# Patient Record
Sex: Female | Born: 1945 | Race: White | Hispanic: No | Marital: Married | Smoking: Never smoker
Health system: Southern US, Community
[De-identification: ages and names within clinical notes are randomized; demographics above are authoritative.]

## PROBLEM LIST (undated history)

## (undated) DIAGNOSIS — I10 Essential (primary) hypertension: Secondary | ICD-10-CM

## (undated) DIAGNOSIS — G8929 Other chronic pain: Secondary | ICD-10-CM

## (undated) DIAGNOSIS — I4891 Unspecified atrial fibrillation: Secondary | ICD-10-CM

## (undated) DIAGNOSIS — D649 Anemia, unspecified: Secondary | ICD-10-CM

## (undated) DIAGNOSIS — Z9181 History of falling: Secondary | ICD-10-CM

## (undated) DIAGNOSIS — K5790 Diverticulosis of intestine, part unspecified, without perforation or abscess without bleeding: Secondary | ICD-10-CM

## (undated) DIAGNOSIS — E039 Hypothyroidism, unspecified: Secondary | ICD-10-CM

## (undated) DIAGNOSIS — M810 Age-related osteoporosis without current pathological fracture: Secondary | ICD-10-CM

## (undated) DIAGNOSIS — N189 Chronic kidney disease, unspecified: Secondary | ICD-10-CM

## (undated) HISTORY — DX: Essential (primary) hypertension: I10

## (undated) HISTORY — PX: CHOLECYSTECTOMY: SHX55

## (undated) HISTORY — PX: ABDOMINAL HYSTERECTOMY: SHX81

## (undated) HISTORY — PX: TONSILLECTOMY: SUR1361

## (undated) HISTORY — PX: BACK SURGERY: SHX140

## (undated) HISTORY — DX: Hypothyroidism, unspecified: E03.9

---

## 2019-04-20 DIAGNOSIS — E039 Hypothyroidism, unspecified: Secondary | ICD-10-CM | POA: Diagnosis present

## 2019-04-20 DIAGNOSIS — I10 Essential (primary) hypertension: Secondary | ICD-10-CM | POA: Diagnosis present

## 2019-04-20 DIAGNOSIS — M1990 Unspecified osteoarthritis, unspecified site: Secondary | ICD-10-CM | POA: Insufficient documentation

## 2019-11-13 DIAGNOSIS — K219 Gastro-esophageal reflux disease without esophagitis: Secondary | ICD-10-CM | POA: Diagnosis present

## 2020-06-09 DIAGNOSIS — N1831 Chronic kidney disease, stage 3a: Secondary | ICD-10-CM | POA: Diagnosis present

## 2020-10-16 ENCOUNTER — Encounter: Payer: Self-pay | Admitting: *Deleted

## 2021-02-24 ENCOUNTER — Ambulatory Visit (INDEPENDENT_AMBULATORY_CARE_PROVIDER_SITE_OTHER): Payer: Medicare Other | Admitting: Gastroenterology

## 2021-02-24 ENCOUNTER — Encounter: Payer: Self-pay | Admitting: Gastroenterology

## 2021-02-24 ENCOUNTER — Other Ambulatory Visit: Payer: Self-pay

## 2021-02-24 DIAGNOSIS — Z8719 Personal history of other diseases of the digestive system: Secondary | ICD-10-CM | POA: Diagnosis not present

## 2021-02-24 DIAGNOSIS — K625 Hemorrhage of anus and rectum: Secondary | ICD-10-CM | POA: Diagnosis not present

## 2021-02-24 NOTE — Progress Notes (Signed)
Primary Care Physician:  [id number], Birdie Hopes, MD Referring Physician: Dr. Levell July Primary Gastroenterologist:  Dr. Marletta Lor  Chief Complaint  Patient presents with   Consult    TCS done 6 years ago in [place]. Blood in stool in April/May few times but none since. No constipation as she takes 2 stool softners a day    HPI:   Felicia Frank is a 75 y.o. female presenting today at the request of Dr. Damaris Schooner for colonoscopy consultation.  Rectal bleeding in April/May a few times after booster shot. Works long hours in taxes. Since May hasn't seen any blood. Wasn't having the best BMs and started taking 2 stool softeners at night. BM daily, normal, no blood since May. Last colonoscopy 6 years ago in [place] by Dr. Teena Dunk. No polyps at that time. Has history of hemorrhoids. Pepcid daily. No dysphagia.   Hesitant to pursue colonoscopy.   Past Medical History:  Diagnosis Date   HTN (hypertension)    Hypothyroidism     Past Surgical History:  Procedure Laterality Date   ABDOMINAL HYSTERECTOMY     BACK SURGERY     CHOLECYSTECTOMY     TONSILLECTOMY      Current Outpatient Medications  Medication Sig Dispense Refill   carvedilol (COREG) 25 MG tablet Take 25 mg by mouth 2 (two) times daily.     cloNIDine (CATAPRES) 0.1 MG tablet 1-2 times as needed     Cream Base (Q-DERM EX) Take by mouth daily.     docusate sodium (COLACE) 100 MG capsule Take 200 mg by mouth at bedtime.     doxazosin (CARDURA) 8 MG tablet Take 8 mg by mouth at bedtime.     famotidine (PEPCID) 40 MG tablet Take 40 mg by mouth daily.     levothyroxine (SYNTHROID) 50 MCG tablet daily.     losartan (COZAAR) 100 MG tablet at bedtime.     spironolactone-hydrochlorothiazide (ALDACTAZIDE) 25-25 MG tablet daily.     traMADol (ULTRAM) 50 MG tablet daily.     zolpidem (AMBIEN) 10 MG tablet Take 10 mg by mouth at bedtime as needed.     No current facility-administered medications for this visit.    Allergies  as of 02/24/2021   (No Known Allergies)    Family History  Problem Relation Age of Onset   Colon cancer Neg Hx    Colon polyps Neg Hx     Social History   Socioeconomic History   Marital status: Married    Spouse name: Not on file   Number of children: Not on file   Years of education: Not on file   Highest education level: Not on file  Occupational History   Not on file  Tobacco Use   Smoking status: Never   Smokeless tobacco: Never  Substance and Sexual Activity   Alcohol use: Never   Drug use: Never   Sexual activity: Not on file  Other Topics Concern   Not on file  Social History Narrative   Not on file   Social Determinants of Health   Financial Resource Strain: Not on file  Food Insecurity: Not on file  Transportation Needs: Not on file  Physical Activity: Not on file  Stress: Not on file  Social Connections: Not on file  Intimate Partner Violence: Not on file    Review of Systems: Gen: Denies any fever, chills, fatigue, weight loss, lack of appetite.  CV: Denies chest pain, heart palpitations, peripheral edema, syncope.  Resp: Denies shortness  of breath at rest or with exertion. Denies wheezing or cough.  GI: see HPI GU : Denies urinary burning, urinary frequency, urinary hesitancy MS: Denies joint pain, muscle weakness, cramps, or limitation of movement.  Derm: Denies rash, itching, dry skin Psych: Denies depression, anxiety, memory loss, and confusion Heme: Denies bruising, bleeding, and enlarged lymph nodes.  Physical Exam: BP 125/67   Pulse (!) 57   Temp 98.2 F (36.8 C)   Ht 5\' 4"  (1.626 m)   Wt 192 lb 12.8 oz (87.5 kg)   BMI 33.09 kg/m  General:   Alert and oriented. Pleasant and cooperative. Well-nourished and well-developed.  Head:  Normocephalic and atraumatic. Eyes:  Without icterus, sclera clear and conjunctiva pink.  Ears:  Normal auditory acuity. Lungs:  Clear to auscultation bilaterally. No wheezes, rales, or rhonchi. No  distress.  Heart:  S1, S2 present without murmurs appreciated.  Abdomen:  +BS, soft, non-tender and non-distended. No HSM noted. No guarding or rebound. No masses appreciated.  Rectal:  no hemorrhoids. No internal mass. HEME NEGATIVE Msk:  Symmetrical without gross deformities. Normal posture. Extremities:  Without edema. Neurologic:  Alert and  oriented x4;  grossly normal neurologically. Skin:  Intact without significant lesions or rashes. Psych:  Alert and cooperative. Normal mood and affect.  ASSESSMENT/PLAN: Felicia Frank is a 75 y.o. female presenting today with history of rectal bleeding in April/May that was likely benign anorectal source; however, she has not had a colonoscopy in approximately 6 years. Overall, she is quite healthy. We discussed pursuing diagnostic colonoscopy, but she wants to hold off on this right now. I did complete a DRE today in clinic, and she was heme negative.   She inquired about other options, and we discussed Cologuard testing. We also discussed this could be a false positive or false negative and should not take the place of colonoscopy. However, she would like to pursue Cologuard before deciding on colonoscopy. If positive, she is willing to pursue a colonoscopy. If negative, she desires to monitor clinically.  Further recommendations following Cologuard results.  10-22-2002, PhD, ANP-BC [facility]

## 2021-02-24 NOTE — Patient Instructions (Signed)
Continue taking 2 stool softeners as you are doing.  We will pursue the Cologuard and go from there!  If you have any further rectal bleeding, please call me!  It was a pleasure to see you today. I want to create trusting relationships with patients to provide genuine, compassionate, and quality care. I value your feedback. If you receive a survey regarding your visit,  I greatly appreciate you taking time to fill this out.   Gelene Mink, PhD, ANP-BC [facility]

## 2021-03-16 LAB — COLOGUARD: Cologuard: NEGATIVE

## 2021-03-30 ENCOUNTER — Telehealth: Payer: Self-pay | Admitting: Internal Medicine

## 2021-03-30 NOTE — Telephone Encounter (Signed)
PATIENT CALLED ASKING ABOUT COLOGUARD RESULTS

## 2021-03-30 NOTE — Telephone Encounter (Signed)
Negative results are in Felicia Frank's box to be reviewed.

## 2021-03-31 NOTE — Telephone Encounter (Signed)
Pt was made aware and verbalized understanding. Pt states that she will revisit the colonoscopy discussion at next office visit since cologuard was negative.

## 2021-03-31 NOTE — Telephone Encounter (Signed)
Cologuard negative. Please let patient know. She was not wanting to pursue a colonoscopy at last visit. Please have her call if changes her mind.

## 2021-08-25 ENCOUNTER — Ambulatory Visit: Payer: Medicare Other | Admitting: Gastroenterology

## 2021-09-29 ENCOUNTER — Ambulatory Visit: Payer: Medicare Other | Admitting: Gastroenterology

## 2021-10-05 ENCOUNTER — Encounter (HOSPITAL_COMMUNITY): Payer: Self-pay | Admitting: Emergency Medicine

## 2021-10-05 ENCOUNTER — Emergency Department (HOSPITAL_COMMUNITY)
Admission: EM | Admit: 2021-10-05 | Discharge: 2021-10-05 | Disposition: A | Discharge status: Home / Self Care | Payer: Medicare Other | Attending: Student | Admitting: Student

## 2021-10-05 ENCOUNTER — Emergency Department (HOSPITAL_COMMUNITY): Payer: Medicare Other

## 2021-10-05 ENCOUNTER — Other Ambulatory Visit: Payer: Self-pay

## 2021-10-05 DIAGNOSIS — D649 Anemia, unspecified: Secondary | ICD-10-CM | POA: Diagnosis not present

## 2021-10-05 DIAGNOSIS — Z79899 Other long term (current) drug therapy: Secondary | ICD-10-CM | POA: Insufficient documentation

## 2021-10-05 DIAGNOSIS — N1831 Chronic kidney disease, stage 3a: Secondary | ICD-10-CM | POA: Insufficient documentation

## 2021-10-05 DIAGNOSIS — R1032 Left lower quadrant pain: Secondary | ICD-10-CM | POA: Diagnosis present

## 2021-10-05 DIAGNOSIS — I129 Hypertensive chronic kidney disease with stage 1 through stage 4 chronic kidney disease, or unspecified chronic kidney disease: Secondary | ICD-10-CM | POA: Diagnosis not present

## 2021-10-05 LAB — URINALYSIS, ROUTINE W REFLEX MICROSCOPIC
Bacteria, UA: NONE SEEN
Bilirubin Urine: NEGATIVE
Glucose, UA: NEGATIVE mg/dL
Ketones, ur: NEGATIVE mg/dL
Leukocytes,Ua: NEGATIVE
Nitrite: NEGATIVE
Protein, ur: NEGATIVE mg/dL
Specific Gravity, Urine: 1.01 (ref 1.005–1.030)
pH: 6 (ref 5.0–8.0)

## 2021-10-05 LAB — COMPREHENSIVE METABOLIC PANEL
ALT: 18 U/L (ref 0–44)
AST: 19 U/L (ref 15–41)
Albumin: 3.7 g/dL (ref 3.5–5.0)
Alkaline Phosphatase: 48 U/L (ref 38–126)
Anion gap: 8 (ref 5–15)
BUN: 28 mg/dL — ABNORMAL HIGH (ref 8–23)
CO2: 22 mmol/L (ref 22–32)
Calcium: 9.1 mg/dL (ref 8.9–10.3)
Chloride: 102 mmol/L (ref 98–111)
Creatinine, Ser: 1.27 mg/dL — ABNORMAL HIGH (ref 0.44–1.00)
GFR, Estimated: 44 mL/min — ABNORMAL LOW (ref >60)
Glucose, Bld: 99 mg/dL (ref 70–99)
Potassium: 4.7 mmol/L (ref 3.5–5.1)
Sodium: 132 mmol/L — ABNORMAL LOW (ref 135–145)
Total Bilirubin: 0.4 mg/dL (ref 0.3–1.2)
Total Protein: 6.8 g/dL (ref 6.5–8.1)

## 2021-10-05 LAB — LIPASE, BLOOD: Lipase: 25 U/L (ref 11–51)

## 2021-10-05 LAB — CBC
HCT: 34.3 % — ABNORMAL LOW (ref 36.0–46.0)
Hemoglobin: 10.8 g/dL — ABNORMAL LOW (ref 12.0–15.0)
MCH: 30.7 pg (ref 26.0–34.0)
MCHC: 31.5 g/dL (ref 30.0–36.0)
MCV: 97.4 fL (ref 80.0–100.0)
Platelets: 211 10*3/uL (ref 150–400)
RBC: 3.52 MIL/uL — ABNORMAL LOW (ref 3.87–5.11)
RDW: 12.9 % (ref 11.5–15.5)
WBC: 7 10*3/uL (ref 4.0–10.5)
nRBC: 0 % (ref 0.0–0.2)

## 2021-10-05 MED ORDER — DICYCLOMINE HCL 10 MG PO CAPS
10.0000 mg | ORAL_CAPSULE | Freq: Once | ORAL | Status: AC
Start: 2021-10-05 — End: 2021-10-05
  Administered 2021-10-05: 10 mg via ORAL
  Filled 2021-10-05: qty 1

## 2021-10-05 MED ORDER — IOHEXOL 300 MG/ML  SOLN
100.0000 mL | Freq: Once | INTRAMUSCULAR | Status: AC | PRN
  Administered 2021-10-05: 80 mL via INTRAVENOUS

## 2021-10-05 NOTE — ED Provider Notes (Signed)
?[facility] ?Provider Note ? ? ?CSN: [id number] ?Arrival date & time: 10/05/21  1603 ? ?  ? ?History ? ?Chief Complaint  ?Patient presents with  ? Abdominal Pain  ? ? ?Junella Kocsis is a 76 y.o. female. ? ?HPI ? ?Patient with medical history including hypertension, CKD stage IIIA presents with complaints of left lower quadrant tenderness.  Initially the pain was intermittent for the last week  but last night she woke up with severe pain in the left side, pain is remained constant, mainly feels in left lower quadrant will radiate to her left flank, she has no constipation diarrhea urinary symptoms, last bowel movement was today, but she states it was "airy" denies melena or hematochezia.  She denies any fevers chills or URI-like symptoms.  Denies any recent sick contacts, patient had a hysterectomy, denies alcohol use NSAID use history of kidney stones or diverticulitis.  She states that her PCP saw her and sent here for further evaluation. ? ? ? ?Home Medications ?Prior to Admission medications   ?Medication Sig Start Date End Date Taking? Authorizing Provider  ?carvedilol (COREG) 25 MG tablet Take 25 mg by mouth 2 (two) times daily. 12/02/20   [provider]  ?cloNIDine (CATAPRES) 0.1 MG tablet 1-2 times as needed 06/09/20   [provider]  ?Cream Base (Q-DERM EX) Take by mouth daily. 03/15/17   [provider]  ?docusate sodium (COLACE) 100 MG capsule Take 200 mg by mouth at bedtime.    [provider]  ?doxazosin (CARDURA) 8 MG tablet Take 8 mg by mouth at bedtime. 12/02/20   [provider]  ?famotidine (PEPCID) 40 MG tablet Take 40 mg by mouth daily. 11/01/20   [provider]  ?levothyroxine (SYNTHROID) 50 MCG tablet daily. 03/15/17   [provider]  ?losartan (COZAAR) 100 MG tablet at bedtime.    [provider]  ?spironolactone-hydrochlorothiazide (ALDACTAZIDE) 25-25 MG tablet daily. 06/09/20   [provider]   ?traMADol (ULTRAM) 50 MG tablet daily. 01/06/21   [provider]  ?zolpidem (AMBIEN) 10 MG tablet Take 10 mg by mouth at bedtime as needed. 12/11/20   [provider]  ?   ? ?Allergies    ?Patient has no known allergies.   ? ?Review of Systems   ?Review of Systems  ?Constitutional:  Negative for chills and fever.  ?Respiratory:  Negative for shortness of breath.   ?Cardiovascular:  Negative for chest pain.  ?Gastrointestinal:  Positive for abdominal pain. Negative for diarrhea, nausea and vomiting.  ?Neurological:  Negative for headaches.  ? ?Physical Exam ?Updated Vital Signs ?BP (!) 129/54   Pulse (!) 47   Temp 97.7 ?F (36.5 ?C) (Oral)   Resp 16   Ht 5\' 3"  (1.6 m)   Wt 86.6 kg   SpO2 95%   BMI 33.83 kg/m?  ?Physical Exam ?Vitals and nursing note reviewed.  ?Constitutional:   ?   General: She is not in acute distress. ?   Appearance: She is not ill-appearing.  ?HENT:  ?   Head: Normocephalic and atraumatic.  ?   Nose: No congestion.  ?Eyes:  ?   Conjunctiva/sclera: Conjunctivae normal.  ?Cardiovascular:  ?   Rate and Rhythm: Normal rate and regular rhythm.  ?   Pulses: Normal pulses.  ?   Heart sounds: No murmur heard. ?  No friction rub. No gallop.  ?Pulmonary:  ?   Effort: No respiratory distress.  ?   Breath sounds: No wheezing,  rhonchi or rales.  ?Abdominal:  ?   Palpations: Abdomen is soft.  ?   Tenderness: There is abdominal tenderness. There is no right CVA tenderness or left CVA tenderness.  ?   Comments: Abdomen nondistended no active bowel sounds, dull to percussion, has tenderness in the left lower quadrant as well as left side, no guarding rebound has peritoneal sign negative Murphy sign McBurney point, she has no CVA tenderness.  ?Musculoskeletal:  ?   Right lower leg: No edema.  ?   Left lower leg: No edema.  ?Skin: ?   General: Skin is warm and dry.  ?Neurological:  ?   Mental Status: She is alert.  ?Psychiatric:     ?   Mood and Affect: Mood normal.  ? ? ?ED Results /  Procedures / Treatments   ?Labs ?(all labs ordered are listed, but only abnormal results are displayed) ?Labs Reviewed  ?COMPREHENSIVE METABOLIC PANEL - Abnormal; Notable for the following components:  ?    Result Value  ? Sodium 132 (*)   ? BUN 28 (*)   ? Creatinine, Ser 1.27 (*)   ? GFR, Estimated 44 (*)   ? All other components within normal limits  ?CBC - Abnormal; Notable for the following components:  ? RBC 3.52 (*)   ? Hemoglobin 10.8 (*)   ? HCT 34.3 (*)   ? All other components within normal limits  ?URINALYSIS, ROUTINE W REFLEX MICROSCOPIC - Abnormal; Notable for the following components:  ? Color, Urine STRAW (*)   ? Hgb urine dipstick SMALL (*)   ? All other components within normal limits  ?LIPASE, BLOOD  ? ? ?EKG ?None ? ?Radiology ?No results found. ? ?Procedures ?Procedures  ? ? ?Medications Ordered in ED ?Medications - No data to display ? ?ED Course/ Medical Decision Making/ A&P ?  ?                        ?Medical Decision Making ?Amount and/or Complexity of Data Reviewed ?Labs: ordered. ?Radiology: ordered. ? ? ?This patient presents to the ED for concern of abdominal pain, this involves an extensive number of treatment options, and is a complaint that carries with it a high risk of complications and morbidity.  The differential diagnosis includes bowel obstruction, diverticulitis, septic stone AAA ? ? ? ?Additional history obtained: ? ?Additional history obtained from N/A ?External records from outside source obtained and reviewed including previous imaging, GI notes ? ? ?Co morbidities that complicate the patient evaluation ? ?Hypertension ? ?Social Determinants of Health: ? ?Geriatric ? ? ? ?Lab Tests: ? ?I Ordered, and personally interpreted labs.  The pertinent results include: CBC shows normocytic anemia hemoglobin 10.8, CMP shows sodium of 132 BUN of 28 creatinine 1.27 lipase 25, UA is unremarkable ? ? ?Imaging Studies ordered: ? ?I ordered imaging studies including CT abdomen pelvis ?I  independently visualized and interpreted imaging which showed pending at this time ?I agree with the radiologist interpretation ? ? ?Cardiac Monitoring: ? ?The patient was maintained on a cardiac monitor.  I personally viewed and interpreted the cardiac monitored which showed an underlying rhythm of: N/A ? ? ?Medicines ordered and prescription drug management: ? ?I ordered medication including N/A ?I have reviewed the patients home medicines and have made adjustments as needed ? ?Critical Interventions: ? ?N/A ? ? ?Reevaluation: ? ?Presents with abdominal pain, she is tender on my exam, concern for possible diverticulitis, will obtain basic lab work-up, CT  imaging and reassess. ? ? ?Consultations Obtained: ? ?N/A ? ? ? ?Test Considered: ? ?N/A ? ? ? ?Rule out ?Low suspicion for systemic infection patient nontoxic-appearing vital signs reassuring does not meet sepsis or SIRS criteria.  I have low suspicion for Pilo, kidney stone, UTI as she has no urinary symptoms, UA is negative for infection or hematuria.  I have low suspicion for lower lobe pneumonia lung sounds are clear bilaterally not Dors any upper respiratory illness.  I have low suspicion for AAA/dissection presentation atypical, no bulging mass on my exam, pain is focalized to the left lower quadrants with associated soft stools.  ? ? ? ?Dispostion and problem list ? ?Due to shift change patient may handoff to [facility] ? ?Follow-up on CT and pelvis, treat accordingly.  Suspect discharge ? ? ? ? ? ? ? ? ? ? ? ?Final Clinical Impression(s) / ED Diagnoses ?Final diagnoses:  ?Left lower quadrant abdominal pain  ? ? ?Rx / DC Orders ?ED Discharge Orders   ? ? None  ? ?  ? ? ?  ?Marcello Fennel, PA-C ?10/05/21 1845 ? ?  ?Teressa Lower, MD ?10/06/21 0101 ? ?

## 2021-10-05 NOTE — ED Provider Notes (Signed)
Patient presents due to left-sided abdominal pain.  I took over patient's care at shift change, please see previous providers for full detail note. ? ?Physical Exam  ?BP (!) 154/58   Pulse (!) 54   Temp 97.7 ?F (36.5 ?C) (Oral)   Resp 18   Ht 5\' 3"  (1.6 m)   Wt 86.6 kg   SpO2 99%   BMI 33.83 kg/m?  ? ?Physical Exam ?Vitals and nursing note reviewed. Exam conducted with a chaperone present.  ?Constitutional:   ?   Appearance: Normal appearance.  ?HENT:  ?   Head: Normocephalic and atraumatic.  ?Eyes:  ?   General: No scleral icterus.    ?   Right eye: No discharge.     ?   Left eye: No discharge.  ?   Extraocular Movements: Extraocular movements intact.  ?   Pupils: Pupils are equal, round, and reactive to light.  ?Cardiovascular:  ?   Rate and Rhythm: Normal rate and regular rhythm.  ?   Pulses: Normal pulses.  ?   Heart sounds: Normal heart sounds. No murmur heard. ?  No friction rub. No gallop.  ?Pulmonary:  ?   Effort: Pulmonary effort is normal. No respiratory distress.  ?   Breath sounds: Normal breath sounds.  ?Abdominal:  ?   General: Abdomen is flat. Bowel sounds are normal. There is no distension.  ?   Palpations: Abdomen is soft.  ?   Tenderness: There is abdominal tenderness in the left lower quadrant.  ?Skin: ?   General: Skin is warm and dry.  ?   Coloration: Skin is not jaundiced.  ?Neurological:  ?   Mental Status: She is alert. Mental status is at baseline.  ?   Coordination: Coordination normal.  ? ? ?Procedures  ?Procedures ? ?ED Course / MDM  ?  ?Medical Decision Making ?Amount and/or Complexity of Data Reviewed ?Labs: ordered. ?Radiology: ordered. ? ?Risk ?Prescription drug management. ? ? ?Patient was signed out to me pending CT abdomen and pelvis.  I did order the patient's Bentyl for pain.  The pain was significantly improved just with time on my evaluation, there are no peritoneal signs or rigidity.  CT notable for diverticulosis but no diverticulitis.  Tolerating p.o., she has  advanced age which she does have outpatient PCP follow-up and GI follow-up already established.  Do not feel she needs additional work-up or observation at this time.  Discussed with patient and her partner who is at bedside.  At this time stable for discharge with close follow-up and return precautions. ? ? ? ? ?  ? , PA-C ?10/05/21 2229 ? ?  ?2230, MD ?10/06/21 12/06/21 ? ?

## 2021-10-05 NOTE — ED Notes (Signed)
Patient transported to CT 

## 2021-10-05 NOTE — ED Triage Notes (Signed)
Pt having abdominal discomfort and pain x 3 days sent by Werner Lean for r/o diverticulitis. ?

## 2021-10-05 NOTE — Discharge Instructions (Signed)
Your care today was reassuring, as we discussed the CT scan did not show any signs of diverticulitis.  Please follow-up with your primary care doctor in the next 2 days for reevaluation.  Call your GI doctor and schedule follow-up appointment.  Return to the ED if things worsen. ?

## 2021-10-07 ENCOUNTER — Encounter: Payer: Self-pay | Admitting: Gastroenterology

## 2021-10-07 ENCOUNTER — Other Ambulatory Visit (HOSPITAL_COMMUNITY)
Admission: RE | Admit: 2021-10-07 | Discharge: 2021-10-07 | Disposition: A | Discharge status: Home / Self Care | Payer: Medicare Other | Source: Ambulatory Visit | Attending: Gastroenterology | Admitting: Gastroenterology

## 2021-10-07 ENCOUNTER — Ambulatory Visit (INDEPENDENT_AMBULATORY_CARE_PROVIDER_SITE_OTHER): Payer: Medicare Other | Admitting: Gastroenterology

## 2021-10-07 VITALS — BP 110/58 | HR 66 | Temp 97.2°F | Ht 63.5 in | Wt 195.0 lb

## 2021-10-07 DIAGNOSIS — K5900 Constipation, unspecified: Secondary | ICD-10-CM | POA: Diagnosis not present

## 2021-10-07 DIAGNOSIS — D649 Anemia, unspecified: Secondary | ICD-10-CM | POA: Diagnosis present

## 2021-10-07 DIAGNOSIS — R1310 Dysphagia, unspecified: Secondary | ICD-10-CM | POA: Diagnosis not present

## 2021-10-07 LAB — FERRITIN: Ferritin: 58 ng/mL (ref 11–307)

## 2021-10-07 LAB — IRON AND TIBC
Iron: 65 ug/dL (ref 28–170)
Saturation Ratios: 17 % (ref 10.4–31.8)
TIBC: 372 ug/dL (ref 250–450)
UIBC: 307 ug/dL

## 2021-10-07 NOTE — Patient Instructions (Signed)
Please have blood work done to check iron studies. ? ?Depending on this, we may need to do a colonoscopy/endoscopy. ? ?You can take Metamucil up to three times a day! ? ?Further recommendations to follow! ? ?I enjoyed seeing you again today! As you know, I value our relationship and want to provide genuine, compassionate, and quality care. I welcome your feedback. If you receive a survey regarding your visit,  I greatly appreciate you taking time to fill this out. See you next time! ? ?Annitta Needs, PhD, ANP-BC ?[facility]  ? ?

## 2021-10-07 NOTE — Progress Notes (Signed)
? ? ? ? ? ?Gastroenterology Office Note   ? ? ?Primary Care Physician:  [name], Birdie Hopes, MD  ?Primary Gastroenterologist: Dr. Marletta Lor ? ? ?Chief Complaint  ? ?Chief Complaint  ?Patient presents with  ? Abdominal Pain  ? ? ? ?History of Present Illness  ? ?Felicia Frank is a 76 y.o. female presenting today in follow-up with a history of constipation, remote rectal bleeding about a year ago, last colonoscopy 6 years ago by Dr. Teena Dunk without polyps. Cologuard negative.  ? ? ?CT Oct 05, 2021 with sigmoid diverticulosis but no diverticulitis. Simple right renal cyst. Hgb 10.8 in ED. Normocytic anemia.  ? ?2 stool softeners, 2 teaspoons of Metamucil in past.  ? ?Was having sharp pains in left lower back that radiates to left lower abdomen. If bending over to the right, will have pain in left back side. Didn't take stool softeners a few nights ago. Instead, took several doses of Metamucil and had more frequent stools. If doesn't take something, will have little balls of stool. Stool softeners not working as well. No rectal bleeding recently. Takes Tramadol. 81 mg aspirin at night.  ? ? ?Sometimes bread or potatoes get hung chronically. Happens every so often.  ? ? ? ?Past Medical History:  ?Diagnosis Date  ? HTN (hypertension)   ? Hypothyroidism   ? ? ?Past Surgical History:  ?Procedure Laterality Date  ? ABDOMINAL HYSTERECTOMY    ? BACK SURGERY    ? CHOLECYSTECTOMY    ? TONSILLECTOMY    ? ? ?Current Outpatient Medications  ?Medication Sig Dispense Refill  ? aspirin EC 81 MG tablet Take 81 mg by mouth daily. Swallow whole.    ? carvedilol (COREG) 25 MG tablet Take 25 mg by mouth 2 (two) times daily.    ? cloNIDine (CATAPRES) 0.1 MG tablet 1-2 times as needed    ? docusate sodium (COLACE) 100 MG capsule Take 200 mg by mouth at bedtime.    ? doxazosin (CARDURA) 8 MG tablet Take 8 mg by mouth at bedtime.    ? famotidine (PEPCID) 40 MG tablet Take 40 mg by mouth daily.    ? levothyroxine (SYNTHROID) 50 MCG tablet  daily.    ? spironolactone-hydrochlorothiazide (ALDACTAZIDE) 25-25 MG tablet daily.    ? traMADol (ULTRAM) 50 MG tablet daily.    ? zolpidem (AMBIEN) 10 MG tablet Take 10 mg by mouth at bedtime as needed.    ? ?No current facility-administered medications for this visit.  ? ? ?Allergies as of 10/07/2021  ? (No Known Allergies)  ? ? ?Family History  ?Problem Relation Age of Onset  ? Colon cancer Neg Hx   ? Colon polyps Neg Hx   ? ? ?Social History  ? ?Socioeconomic History  ? Marital status: Married  ?  Spouse name: Not on file  ? Number of children: Not on file  ? Years of education: Not on file  ? Highest education level: Not on file  ?Occupational History  ? Not on file  ?Tobacco Use  ? Smoking status: Never  ? Smokeless tobacco: Never  ?Substance and Sexual Activity  ? Alcohol use: Never  ? Drug use: Never  ? Sexual activity: Not on file  ?Other Topics Concern  ? Not on file  ?Social History Narrative  ? Not on file  ? ?Social Determinants of Health  ? ?Financial Resource Strain: Not on file  ?Food Insecurity: Not on file  ?Transportation Needs: Not on file  ?Physical Activity: Not on  file  ?Stress: Not on file  ?Social Connections: Not on file  ?Intimate Partner Violence: Not on file  ? ? ? ?Review of Systems  ? ?Gen: Denies any fever, chills, fatigue, weight loss, lack of appetite.  ?CV: Denies chest pain, heart palpitations, peripheral edema, syncope.  ?Resp: Denies shortness of breath at rest or with exertion. Denies wheezing or cough.  ?GI: see HPI ?GU : Denies urinary burning, urinary frequency, urinary hesitancy ?MS: Denies joint pain, muscle weakness, cramps, or limitation of movement.  ?Derm: Denies rash, itching, dry skin ?Psych: Denies depression, anxiety, memory loss, and confusion ?Heme: Denies bruising, bleeding, and enlarged lymph nodes. ? ? ?Physical Exam  ? ?BP (!) 110/58   Pulse 66   Temp (!) 97.2 ?F (36.2 ?C)   Ht 5' 3.5" (1.613 m)   Wt 195 lb (88.5 kg)   BMI 34.00 kg/m?  ?General:   Alert  and oriented. Pleasant and cooperative. Well-nourished and well-developed.  ?Head:  Normocephalic and atraumatic. ?Eyes:  Without icterus ?Abdomen:  +BS, soft, non-tender and non-distended. No HSM noted. No guarding or rebound. No masses appreciated.  ?Rectal:  Deferred  ?Msk:  Symmetrical without gross deformities. Normal posture. ?Extremities:  Without edema. ?Neurologic:  Alert and  oriented x4;  grossly normal neurologically. ?Skin:  Intact without significant lesions or rashes. ?Psych:  Alert and cooperative. Normal mood and affect. ? ? ?Assessment  ? ?Felicia Frank is a 76 y.o. female presenting today in follow-up with a history of   constipation, remote rectal bleeding about a year ago, last colonoscopy 6 years ago by Dr. Teena Dunk without polyps. Cologuard negative.  ? ?Notes left lower back pain/left sided abdominal pain: CT negative. Appears musculoskeletal. Physical exam benign.  ? ?Normocytic anemia: patient reports Hgb normally in 13 range. Hgb 10.8 recently. No overt GI bleeding currently. Will check iron studies. ? ?Dysphagia: to bread or potatoes. 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 Awaiting iron studies. May need colonoscopy along with EGD/dilation ? ?Constipation: stop stool softeners as not effective. Increase Metamucil.  ? ? ?PLAN  ? ?May increase Metamucil ?Iron studies today ?If IDA, needs colonoscopy/EGD. If no IDA, offer EGD/dilation due to dysphagia.  ? ? ?Gelene Mink, PhD, ANP-BC ?[facility]  ? ? ?

## 2021-11-18 ENCOUNTER — Encounter: Payer: Self-pay | Admitting: Gastroenterology

## 2021-12-23 ENCOUNTER — Ambulatory Visit: Payer: Medicare Other | Admitting: Gastroenterology

## 2022-02-02 ENCOUNTER — Ambulatory Visit: Payer: Medicare Other | Admitting: Gastroenterology

## 2022-06-08 ENCOUNTER — Ambulatory Visit: Payer: Medicare Other | Admitting: Gastroenterology

## 2022-09-12 IMAGING — CT CT ABD-PELV W/ CM
2 of 5 series · 16 of 46 positions shown, 18 images · IV contrast (Omnipaque or Isovue)
Comparison: None Available.

CLINICAL DATA: Left-sided abdominal pain for 3 days.

EXAM:
CT ABDOMEN AND PELVIS WITH CONTRAST
TECHNIQUE: Multidetector CT imaging of the abdomen and pelvis was performed
using the standard protocol following bolus administration of
intravenous contrast.

[Series 2: axial st · axial · 0.96mm/px · z∈[+952,+1362]mm · 13 of 92 slices shown, 15 images]
[im 5/92  soft-tissue]
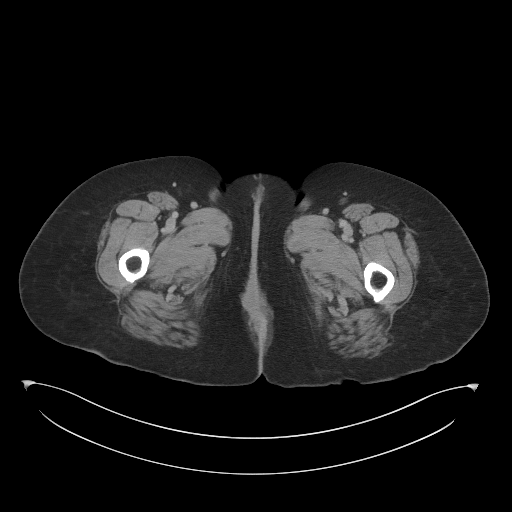
[im 5/92  bone]
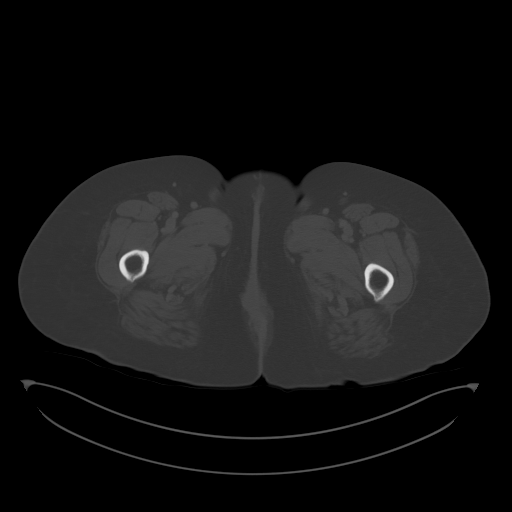
[im 15/92  soft-tissue]
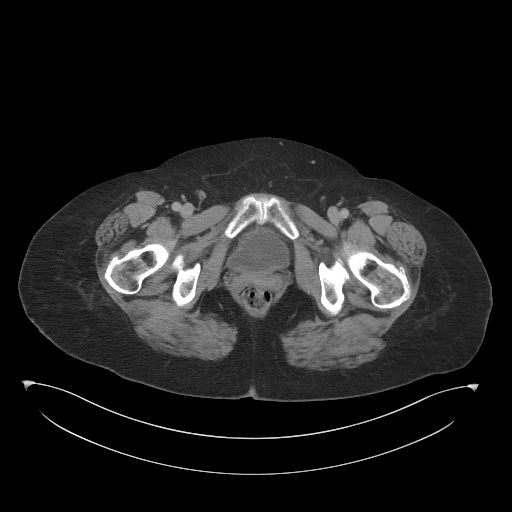
[im 20/92  soft-tissue]
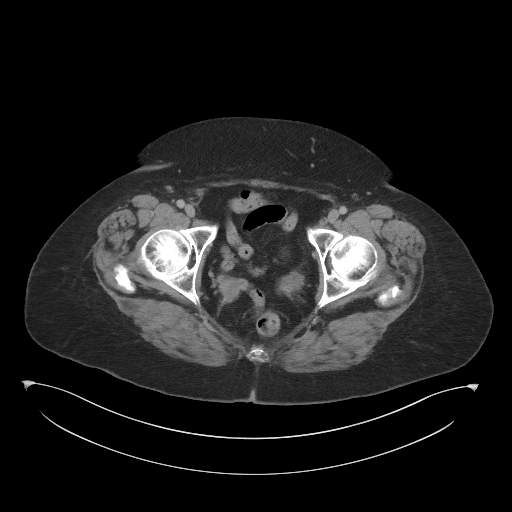
[im 24/92  soft-tissue]
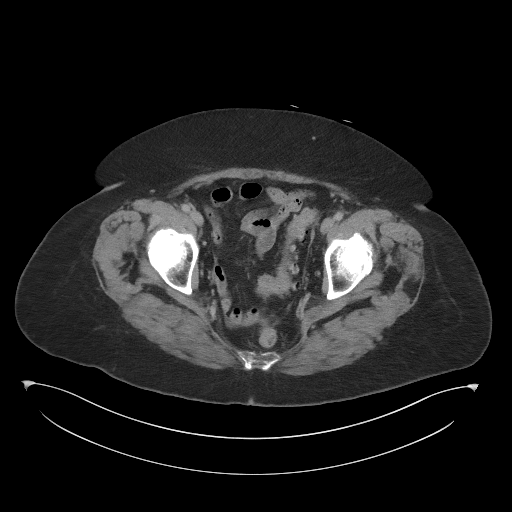
[im 34/92  soft-tissue]
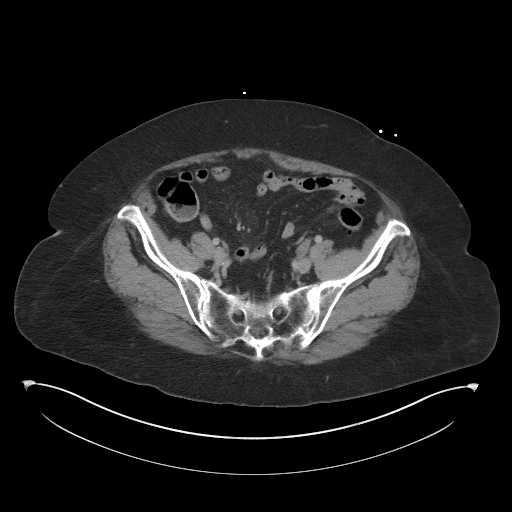
[im 39/92  soft-tissue]
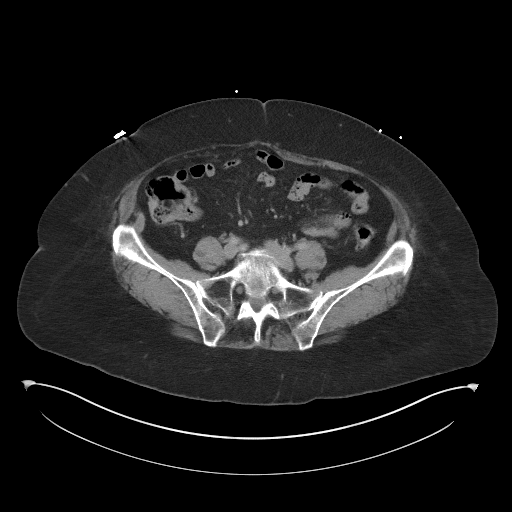
[im 48/92  soft-tissue]
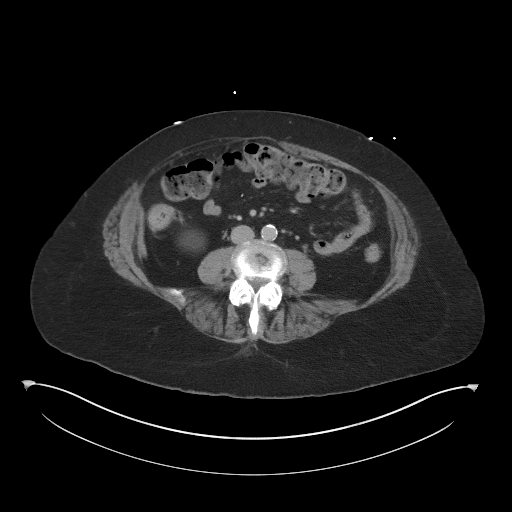
[im 53/92  soft-tissue]
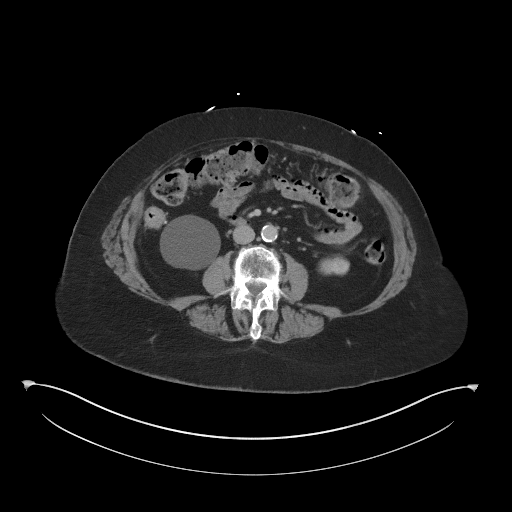
[im 58/92  soft-tissue]
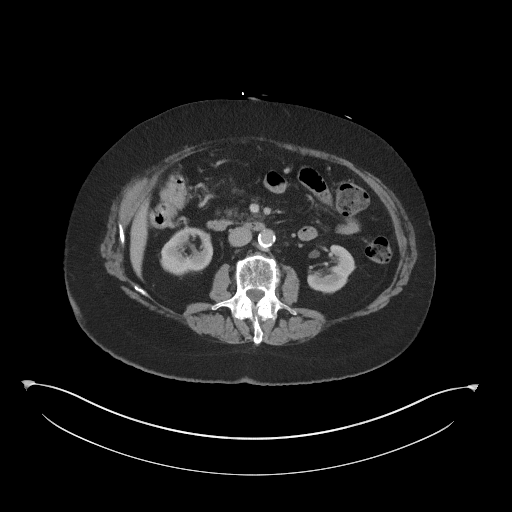
[im 58/92  bone]
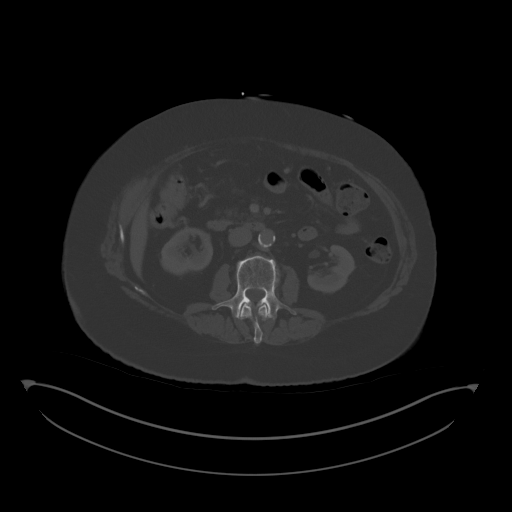
[im 68/92  soft-tissue]
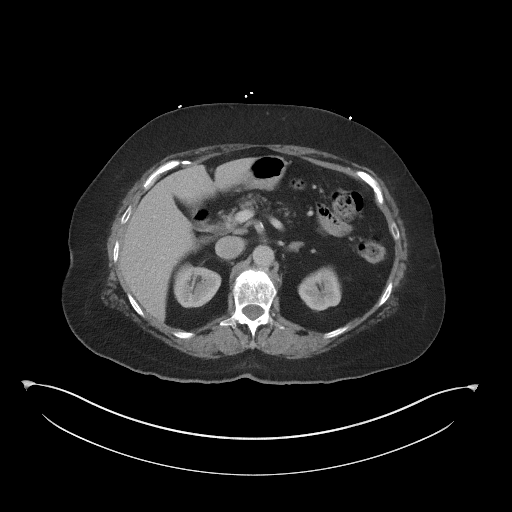
[im 72/92  soft-tissue]
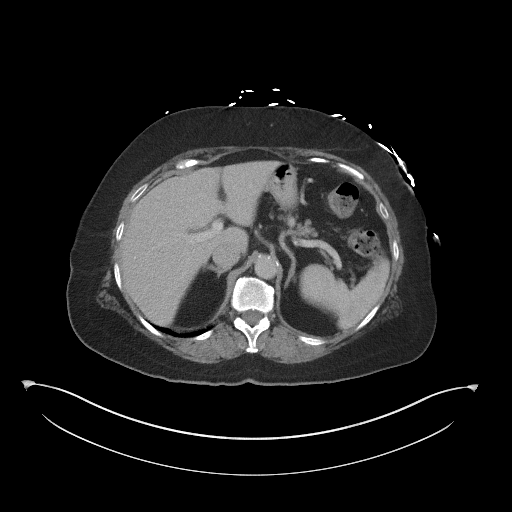
[im 77/92  soft-tissue]
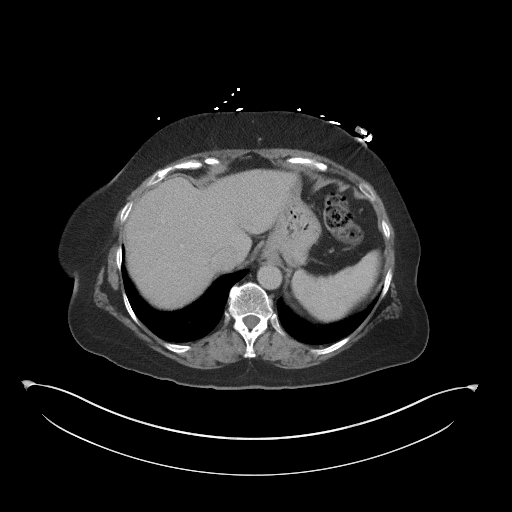
[im 87/92  soft-tissue]
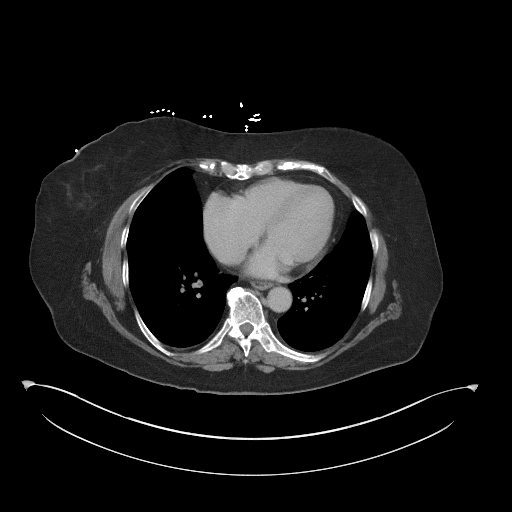

[Series 5: coronal st · coronal · 0.79mm/px · 3 of 113 slices shown]
[im 38/113  soft-tissue]
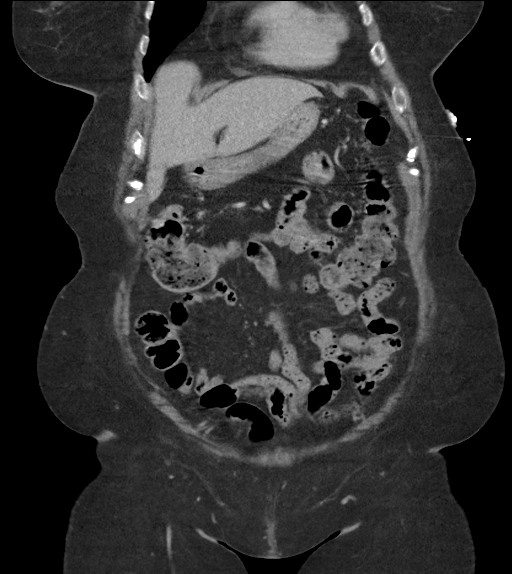
[im 50/113  soft-tissue]
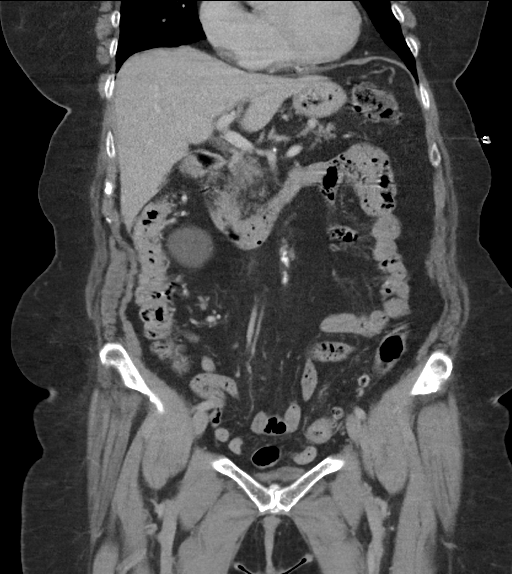
[im 63/113  soft-tissue]
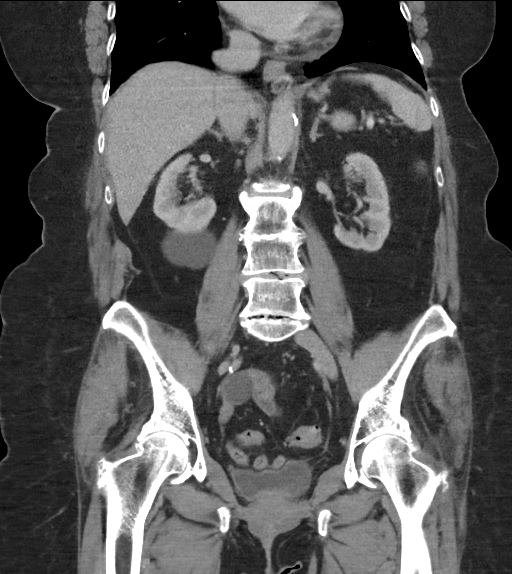

[16 of 46 positions shown; findings below may reference images not displayed]

RADIATION DOSE REDUCTION: This exam was performed according to the
departmental dose-optimization program which includes automated
exposure control, adjustment of the mA and/or kV according to
patient size and/or use of iterative reconstruction technique.

CONTRAST:  80mL OMNIPAQUE IOHEXOL 300 MG/ML  SOLN
FINDINGS: Lower chest: No acute abnormality.

Hepatobiliary: No focal liver abnormality is seen. Status post
cholecystectomy. No biliary dilatation.

Pancreas: Unremarkable. No pancreatic ductal dilatation or
surrounding inflammatory changes.

Spleen: Normal in size without focal abnormality.

Adrenals/Urinary Tract: Normal adrenal glands. Simple cyst emanating
from the lower pole of the right kidney measures up to 5.5 cm in
estimated maximum diameter. This appears benign and requires no
follow-up. The bladder is decompressed.

Stomach/Bowel: Diverticulosis of the sigmoid and descending colon
without CT evidence acute diverticulitis. Bowel shows no evidence of
obstruction, ileus or visible lesion. The appendix is not visualized
and may have been previously removed. No free intraperitoneal air
identified.

Vascular/Lymphatic: Atherosclerosis of the abdominal aorta without
aneurysm. No lymphadenopathy identified.

Reproductive: Status post hysterectomy. No adnexal masses.

Other: No abdominal wall hernia or abnormality. No abdominopelvic
ascites.

Musculoskeletal: Degenerative disc disease of the lumbar spine. No
fractures or bony lesions identified.
IMPRESSION: 1. Diverticulosis of the sigmoid colon without evidence of acute
diverticulitis by CT.
2. Simple cyst of the right kidney measures up to 5.5 cm. This
requires no follow-up.

## 2023-06-14 ENCOUNTER — Other Ambulatory Visit (HOSPITAL_COMMUNITY): Payer: Self-pay | Admitting: Surgery

## 2023-06-14 DIAGNOSIS — M5416 Radiculopathy, lumbar region: Secondary | ICD-10-CM

## 2023-06-22 ENCOUNTER — Ambulatory Visit (HOSPITAL_COMMUNITY)
Admission: RE | Admit: 2023-06-22 | Discharge: 2023-06-22 | Disposition: A | Discharge status: Home / Self Care | Payer: Medicare Other | Source: Ambulatory Visit | Attending: Surgery | Admitting: Surgery

## 2023-06-22 DIAGNOSIS — M5416 Radiculopathy, lumbar region: Secondary | ICD-10-CM | POA: Diagnosis present

## 2023-06-22 MED ORDER — GADOBUTROL 1 MMOL/ML IV SOLN
9.0000 mL | Freq: Once | INTRAVENOUS | Status: AC | PRN
  Administered 2023-06-22: 9 mL via INTRAVENOUS

## 2023-09-16 ENCOUNTER — Encounter (HOSPITAL_COMMUNITY): Payer: Self-pay | Admitting: *Deleted

## 2023-09-16 ENCOUNTER — Emergency Department (HOSPITAL_COMMUNITY)

## 2023-09-16 ENCOUNTER — Inpatient Hospital Stay (HOSPITAL_COMMUNITY)
Admission: EM | Admit: 2023-09-16 | Discharge: 2023-09-28 | DRG: 391 | Disposition: A | Discharge status: Home Health | Attending: Internal Medicine | Admitting: Internal Medicine

## 2023-09-16 ENCOUNTER — Other Ambulatory Visit: Payer: Self-pay

## 2023-09-16 DIAGNOSIS — D631 Anemia in chronic kidney disease: Secondary | ICD-10-CM | POA: Diagnosis present

## 2023-09-16 DIAGNOSIS — Z79890 Hormone replacement therapy (postmenopausal): Secondary | ICD-10-CM

## 2023-09-16 DIAGNOSIS — D649 Anemia, unspecified: Secondary | ICD-10-CM

## 2023-09-16 DIAGNOSIS — Z7982 Long term (current) use of aspirin: Secondary | ICD-10-CM

## 2023-09-16 DIAGNOSIS — E66811 Obesity, class 1: Secondary | ICD-10-CM

## 2023-09-16 DIAGNOSIS — K632 Fistula of intestine: Principal | ICD-10-CM | POA: Diagnosis present

## 2023-09-16 DIAGNOSIS — Z79818 Long term (current) use of other agents affecting estrogen receptors and estrogen levels: Secondary | ICD-10-CM

## 2023-09-16 DIAGNOSIS — I10 Essential (primary) hypertension: Secondary | ICD-10-CM | POA: Diagnosis present

## 2023-09-16 DIAGNOSIS — I4819 Other persistent atrial fibrillation: Secondary | ICD-10-CM | POA: Diagnosis present

## 2023-09-16 DIAGNOSIS — S32010A Wedge compression fracture of first lumbar vertebra, initial encounter for closed fracture: Secondary | ICD-10-CM | POA: Diagnosis present

## 2023-09-16 DIAGNOSIS — K567 Ileus, unspecified: Secondary | ICD-10-CM | POA: Diagnosis present

## 2023-09-16 DIAGNOSIS — Z7901 Long term (current) use of anticoagulants: Secondary | ICD-10-CM

## 2023-09-16 DIAGNOSIS — K219 Gastro-esophageal reflux disease without esophagitis: Secondary | ICD-10-CM | POA: Diagnosis present

## 2023-09-16 DIAGNOSIS — R109 Unspecified abdominal pain: Secondary | ICD-10-CM

## 2023-09-16 DIAGNOSIS — I129 Hypertensive chronic kidney disease with stage 1 through stage 4 chronic kidney disease, or unspecified chronic kidney disease: Secondary | ICD-10-CM | POA: Diagnosis present

## 2023-09-16 DIAGNOSIS — Z888 Allergy status to other drugs, medicaments and biological substances status: Secondary | ICD-10-CM

## 2023-09-16 DIAGNOSIS — K56600 Partial intestinal obstruction, unspecified as to cause: Secondary | ICD-10-CM | POA: Diagnosis present

## 2023-09-16 DIAGNOSIS — E039 Hypothyroidism, unspecified: Secondary | ICD-10-CM | POA: Diagnosis present

## 2023-09-16 DIAGNOSIS — E860 Dehydration: Secondary | ICD-10-CM | POA: Diagnosis present

## 2023-09-16 DIAGNOSIS — K5732 Diverticulitis of large intestine without perforation or abscess without bleeding: Secondary | ICD-10-CM | POA: Diagnosis not present

## 2023-09-16 DIAGNOSIS — K5792 Diverticulitis of intestine, part unspecified, without perforation or abscess without bleeding: Secondary | ICD-10-CM | POA: Diagnosis present

## 2023-09-16 DIAGNOSIS — E875 Hyperkalemia: Secondary | ICD-10-CM | POA: Diagnosis present

## 2023-09-16 DIAGNOSIS — I4891 Unspecified atrial fibrillation: Secondary | ICD-10-CM | POA: Diagnosis not present

## 2023-09-16 DIAGNOSIS — M4856XA Collapsed vertebra, not elsewhere classified, lumbar region, initial encounter for fracture: Secondary | ICD-10-CM | POA: Diagnosis present

## 2023-09-16 DIAGNOSIS — E871 Hypo-osmolality and hyponatremia: Secondary | ICD-10-CM | POA: Diagnosis present

## 2023-09-16 DIAGNOSIS — Z6832 Body mass index (BMI) 32.0-32.9, adult: Secondary | ICD-10-CM

## 2023-09-16 DIAGNOSIS — K651 Peritoneal abscess: Secondary | ICD-10-CM | POA: Diagnosis present

## 2023-09-16 DIAGNOSIS — N179 Acute kidney failure, unspecified: Secondary | ICD-10-CM | POA: Diagnosis present

## 2023-09-16 DIAGNOSIS — K5720 Diverticulitis of large intestine with perforation and abscess without bleeding: Principal | ICD-10-CM | POA: Diagnosis present

## 2023-09-16 DIAGNOSIS — N1831 Chronic kidney disease, stage 3a: Secondary | ICD-10-CM | POA: Diagnosis present

## 2023-09-16 DIAGNOSIS — Z79899 Other long term (current) drug therapy: Secondary | ICD-10-CM | POA: Diagnosis not present

## 2023-09-16 DIAGNOSIS — R627 Adult failure to thrive: Secondary | ICD-10-CM | POA: Diagnosis present

## 2023-09-16 DIAGNOSIS — S32010D Wedge compression fracture of first lumbar vertebra, subsequent encounter for fracture with routine healing: Secondary | ICD-10-CM | POA: Diagnosis not present

## 2023-09-16 LAB — CBC WITH DIFFERENTIAL/PLATELET
Abs Immature Granulocytes: 0.38 10*3/uL — ABNORMAL HIGH (ref 0.00–0.07)
Basophils Absolute: 0.1 10*3/uL (ref 0.0–0.1)
Basophils Relative: 0 %
Eosinophils Absolute: 0.3 10*3/uL (ref 0.0–0.5)
Eosinophils Relative: 2 %
HCT: 31.9 % — ABNORMAL LOW (ref 36.0–46.0)
Hemoglobin: 10.6 g/dL — ABNORMAL LOW (ref 12.0–15.0)
Immature Granulocytes: 3 %
Lymphocytes Relative: 6 %
Lymphs Abs: 0.8 10*3/uL (ref 0.7–4.0)
MCH: 31.5 pg (ref 26.0–34.0)
MCHC: 33.2 g/dL (ref 30.0–36.0)
MCV: 94.7 fL (ref 80.0–100.0)
Monocytes Absolute: 0.8 10*3/uL (ref 0.1–1.0)
Monocytes Relative: 6 %
Neutro Abs: 10.8 10*3/uL — ABNORMAL HIGH (ref 1.7–7.7)
Neutrophils Relative %: 83 %
Platelets: 278 10*3/uL (ref 150–400)
RBC: 3.37 MIL/uL — ABNORMAL LOW (ref 3.87–5.11)
RDW: 13.8 % (ref 11.5–15.5)
WBC: 13.1 10*3/uL — ABNORMAL HIGH (ref 4.0–10.5)
nRBC: 0 % (ref 0.0–0.2)

## 2023-09-16 LAB — URINALYSIS, ROUTINE W REFLEX MICROSCOPIC
Bacteria, UA: NONE SEEN
Bilirubin Urine: NEGATIVE
Glucose, UA: NEGATIVE mg/dL
Hgb urine dipstick: NEGATIVE
Ketones, ur: NEGATIVE mg/dL
Nitrite: NEGATIVE
Protein, ur: NEGATIVE mg/dL
Specific Gravity, Urine: 1.015 (ref 1.005–1.030)
pH: 5 (ref 5.0–8.0)

## 2023-09-16 LAB — COMPREHENSIVE METABOLIC PANEL WITH GFR
ALT: 23 U/L (ref 0–44)
AST: 15 U/L (ref 15–41)
Albumin: 2.9 g/dL — ABNORMAL LOW (ref 3.5–5.0)
Alkaline Phosphatase: 73 U/L (ref 38–126)
Anion gap: 10 (ref 5–15)
BUN: 38 mg/dL — ABNORMAL HIGH (ref 8–23)
CO2: 20 mmol/L — ABNORMAL LOW (ref 22–32)
Calcium: 9.2 mg/dL (ref 8.9–10.3)
Chloride: 99 mmol/L (ref 98–111)
Creatinine, Ser: 1.46 mg/dL — ABNORMAL HIGH (ref 0.44–1.00)
GFR, Estimated: 37 mL/min — ABNORMAL LOW (ref >60)
Glucose, Bld: 102 mg/dL — ABNORMAL HIGH (ref 70–99)
Potassium: 4.7 mmol/L (ref 3.5–5.1)
Sodium: 129 mmol/L — ABNORMAL LOW (ref 135–145)
Total Bilirubin: 0.3 mg/dL (ref 0.0–1.2)
Total Protein: 6.7 g/dL (ref 6.5–8.1)

## 2023-09-16 LAB — LIPASE, BLOOD: Lipase: 27 U/L (ref 11–51)

## 2023-09-16 MED ORDER — ENOXAPARIN SODIUM 40 MG/0.4ML IJ SOSY
40.0000 mg | PREFILLED_SYRINGE | INTRAMUSCULAR | Status: DC
  Administered 2023-09-16: 40 mg via SUBCUTANEOUS
  Filled 2023-09-16: qty 0.4

## 2023-09-16 MED ORDER — SODIUM CHLORIDE 0.9 % IV SOLN: INTRAVENOUS | Status: DC

## 2023-09-16 MED ORDER — MORPHINE SULFATE (PF) 4 MG/ML IV SOLN
4.0000 mg | Freq: Once | INTRAVENOUS | Status: AC
  Administered 2023-09-16: 4 mg via INTRAVENOUS
  Filled 2023-09-16: qty 1

## 2023-09-16 MED ORDER — CIPROFLOXACIN IN D5W 400 MG/200ML IV SOLN
400.0000 mg | Freq: Once | INTRAVENOUS | Status: AC
  Administered 2023-09-16: 400 mg via INTRAVENOUS
  Filled 2023-09-16: qty 200

## 2023-09-16 MED ORDER — ZOLPIDEM TARTRATE 5 MG PO TABS
5.0000 mg | ORAL_TABLET | Freq: Every evening | ORAL | Status: DC | PRN
  Administered 2023-09-18 – 2023-09-26 (×2): 5 mg via ORAL
  Filled 2023-09-16 (×2): qty 1

## 2023-09-16 MED ORDER — SODIUM CHLORIDE 0.9 % IV BOLUS
1000.0000 mL | Freq: Once | INTRAVENOUS | Status: AC
Start: 2023-09-16 — End: 2023-09-16
  Administered 2023-09-16: 1000 mL via INTRAVENOUS

## 2023-09-16 MED ORDER — METRONIDAZOLE 500 MG/100ML IV SOLN
500.0000 mg | Freq: Once | INTRAVENOUS | Status: AC
  Administered 2023-09-16: 500 mg via INTRAVENOUS
  Filled 2023-09-16: qty 100

## 2023-09-16 MED ORDER — IOHEXOL 300 MG/ML  SOLN
80.0000 mL | Freq: Once | INTRAMUSCULAR | Status: AC | PRN
  Administered 2023-09-16: 80 mL via INTRAVENOUS

## 2023-09-16 MED ORDER — CARVEDILOL 12.5 MG PO TABS
25.0000 mg | ORAL_TABLET | Freq: Two times a day (BID) | ORAL | Status: DC
  Administered 2023-09-16 – 2023-09-20 (×5): 25 mg via ORAL
  Filled 2023-09-16 (×7): qty 2

## 2023-09-16 MED ORDER — DOXAZOSIN MESYLATE 2 MG PO TABS
8.0000 mg | ORAL_TABLET | Freq: Every day | ORAL | Status: DC
  Administered 2023-09-16 – 2023-09-27 (×6): 8 mg via ORAL
  Filled 2023-09-16 (×9): qty 4

## 2023-09-16 MED ORDER — CIPROFLOXACIN IN D5W 400 MG/200ML IV SOLN
400.0000 mg | Freq: Two times a day (BID) | INTRAVENOUS | Status: DC
  Administered 2023-09-17: 400 mg via INTRAVENOUS
  Filled 2023-09-16: qty 200

## 2023-09-16 MED ORDER — FAMOTIDINE 20 MG PO TABS
40.0000 mg | ORAL_TABLET | Freq: Every day | ORAL | Status: DC
  Administered 2023-09-17: 40 mg via ORAL
  Filled 2023-09-16: qty 2

## 2023-09-16 MED ORDER — ONDANSETRON HCL 4 MG/2ML IJ SOLN
4.0000 mg | Freq: Once | INTRAMUSCULAR | Status: AC
Start: 2023-09-16 — End: 2023-09-16
  Administered 2023-09-16: 4 mg via INTRAVENOUS
  Filled 2023-09-16: qty 2

## 2023-09-16 MED ORDER — HYDROMORPHONE HCL 1 MG/ML IJ SOLN
0.5000 mg | INTRAMUSCULAR | Status: DC | PRN
  Administered 2023-09-16 – 2023-09-17 (×2): 0.5 mg via INTRAVENOUS
  Filled 2023-09-16 (×2): qty 0.5

## 2023-09-16 MED ORDER — METRONIDAZOLE 500 MG/100ML IV SOLN
500.0000 mg | Freq: Two times a day (BID) | INTRAVENOUS | Status: DC
  Administered 2023-09-17 – 2023-09-18 (×3): 500 mg via INTRAVENOUS
  Filled 2023-09-16 (×3): qty 100

## 2023-09-16 MED ORDER — DOCUSATE SODIUM 100 MG PO CAPS
200.0000 mg | ORAL_CAPSULE | Freq: Every day | ORAL | Status: DC
  Administered 2023-09-16 – 2023-09-27 (×7): 200 mg via ORAL
  Filled 2023-09-16 (×9): qty 2

## 2023-09-16 MED ORDER — LEVOTHYROXINE SODIUM 50 MCG PO TABS
50.0000 ug | ORAL_TABLET | Freq: Every day | ORAL | Status: DC
  Administered 2023-09-17 – 2023-09-28 (×7): 50 ug via ORAL
  Filled 2023-09-16: qty 1
  Filled 2023-09-16 (×2): qty 2
  Filled 2023-09-16 (×4): qty 1
  Filled 2023-09-16: qty 2

## 2023-09-16 NOTE — H&P (Signed)
 History and Physical    Patient: Felicia Frank YNW:[id number] DOB: 1946-05-25 DOA: 09/16/2023 DOS: the patient was seen and examined on 09/16/2023 PCP: [name], [name], MD  Patient coming from: Home  Chief Complaint:  Chief Complaint  Patient presents with   Fall   HPI: Felicia Frank is a 78 y.o. female with medical history significant of Hypothyroidism, htn, CKD stage1. She presents with decreased appetite, FTT for 1 month with decreasing weight -she states that she is lost 20 pounds over the last month.  She has had very little appetite.  She did have a fall a couple of days ago and has had some back pain since then.  No muscle weakness, incontinence, urinary retention.  Review of Systems: As mentioned in the history of present illness. All other systems reviewed and are negative. Past Medical History:  Diagnosis Date   HTN (hypertension)    Hypothyroidism    Past Surgical History:  Procedure Laterality Date   ABDOMINAL HYSTERECTOMY     BACK SURGERY     CHOLECYSTECTOMY     TONSILLECTOMY     Social History:  reports that she has never smoked. She has never used smokeless tobacco. She reports that she does not drink alcohol and does not use drugs.  No Known Allergies  Family History  Problem Relation Age of Onset   Colon cancer Neg Hx    Colon polyps Neg Hx     Prior to Admission medications   Medication Sig Start Date End Date Taking? Authorizing Provider  aspirin EC 81 MG tablet Take 81 mg by mouth daily. Swallow whole.    [provider]  carvedilol  (COREG ) 25 MG tablet Take 25 mg by mouth 2 (two) times daily. 12/02/20   [provider]  cloNIDine (CATAPRES) 0.1 MG tablet 1-2 times as needed 06/09/20   [provider]  docusate sodium  (COLACE) 100 MG capsule Take 200 mg by mouth at bedtime.    [provider]  doxazosin  (CARDURA ) 8 MG tablet Take 8 mg by mouth at bedtime. 12/02/20   [provider]  famotidine  (PEPCID )  40 MG tablet Take 40 mg by mouth daily. 11/01/20   [provider]  levothyroxine  (SYNTHROID ) 50 MCG tablet daily. 03/15/17   [provider]  spironolactone-hydrochlorothiazide (ALDACTAZIDE) 25-25 MG tablet daily. 06/09/20   [provider]  traMADol (ULTRAM) 50 MG tablet daily. 01/06/21   [provider]  zolpidem  (AMBIEN ) 10 MG tablet Take 10 mg by mouth at bedtime as needed. 12/11/20   [provider]    Physical Exam: Vitals:   09/16/23 1800 09/16/23 1830 09/16/23 1900 09/16/23 1916  BP: (!) 159/92 (!) 166/95 (!) 166/69   Pulse: 70 67    Resp:      Temp:    98.1 F (36.7 C)  TempSrc:    Oral  SpO2: 91% 93%    Weight:      Height:       General: elderly female. Awake and alert and oriented x3. No acute cardiopulmonary distress.  HEENT: Normocephalic atraumatic.  Right and left ears normal in appearance.  Pupils equal, round, reactive to light. Extraocular muscles are intact. Sclerae anicteric and noninjected.  Moist mucosal membranes. No mucosal lesions.  Neck: Neck supple without lymphadenopathy. No carotid bruits. No masses palpated.  Cardiovascular: Regular rate with normal S1-S2 sounds. No murmurs, rubs, gallops auscultated. No JVD.  Respiratory: Good respiratory effort with no wheezes, rales, rhonchi. Lungs clear to auscultation bilaterally.  No  accessory muscle use. Abdomen: Soft, tender diffusely with mild guarding. No rebound.  nondistended. Active bowel sounds. No masses or hepatosplenomegaly  Skin: No rashes, lesions, or ulcerations.  Dry, warm to touch. 2+ dorsalis pedis and radial pulses. Musculoskeletal: No calf or leg pain. All major joints not erythematous nontender.  No upper or lower joint deformation.  Good ROM.  No contractures  Psychiatric: Intact judgment and insight. Pleasant and cooperative. Neurologic: No focal neurological deficits. Strength is 5/5 and symmetric in upper and lower extremities.  Cranial nerves II through  XII are grossly intact.  Data Reviewed: Results for orders placed or performed during the hospital encounter of 09/16/23 (from the past 24 hours)  CBC with Differential     Status: Abnormal   Collection Time: 09/16/23 12:45 PM  Result Value Ref Range   WBC 13.1 (H) 4.0 - 10.5 K/uL   RBC 3.37 (L) 3.87 - 5.11 MIL/uL   Hemoglobin 10.6 (L) 12.0 - 15.0 g/dL   HCT 16.1 (L) 09.6 - 04.5 %   MCV 94.7 80.0 - 100.0 fL   MCH 31.5 26.0 - 34.0 pg   MCHC 33.2 30.0 - 36.0 g/dL   RDW 40.9 81.1 - 91.4 %   Platelets 278 150 - 400 K/uL   nRBC 0.0 0.0 - 0.2 %   Neutrophils Relative % 83 %   Neutro Abs 10.8 (H) 1.7 - 7.7 K/uL   Lymphocytes Relative 6 %   Lymphs Abs 0.8 0.7 - 4.0 K/uL   Monocytes Relative 6 %   Monocytes Absolute 0.8 0.1 - 1.0 K/uL   Eosinophils Relative 2 %   Eosinophils Absolute 0.3 0.0 - 0.5 K/uL   Basophils Relative 0 %   Basophils Absolute 0.1 0.0 - 0.1 K/uL   Immature Granulocytes 3 %   Abs Immature Granulocytes 0.38 (H) 0.00 - 0.07 K/uL  Comprehensive metabolic panel     Status: Abnormal   Collection Time: 09/16/23 12:45 PM  Result Value Ref Range   Sodium 129 (L) 135 - 145 mmol/L   Potassium 4.7 3.5 - 5.1 mmol/L   Chloride 99 98 - 111 mmol/L   CO2 20 (L) 22 - 32 mmol/L   Glucose, Bld 102 (H) 70 - 99 mg/dL   BUN 38 (H) 8 - 23 mg/dL   Creatinine, Ser 7.82 (H) 0.44 - 1.00 mg/dL   Calcium 9.2 8.9 - 95.6 mg/dL   Total Protein 6.7 6.5 - 8.1 g/dL   Albumin 2.9 (L) 3.5 - 5.0 g/dL   AST 15 15 - 41 U/L   ALT 23 0 - 44 U/L   Alkaline Phosphatase 73 38 - 126 U/L   Total Bilirubin 0.3 0.0 - 1.2 mg/dL   GFR, Estimated 37 (L) >60 mL/min   Anion gap 10 5 - 15  Lipase, blood     Status: None   Collection Time: 09/16/23 12:45 PM  Result Value Ref Range   Lipase 27 11 - 51 U/L  Urinalysis, Routine w reflex microscopic -Urine, Clean Catch     Status: Abnormal   Collection Time: 09/16/23  3:08 PM  Result Value Ref Range   Color, Urine YELLOW YELLOW   APPearance HAZY (A) CLEAR    Specific Gravity, Urine 1.015 1.005 - 1.030   pH 5.0 5.0 - 8.0   Glucose, UA NEGATIVE NEGATIVE mg/dL   Hgb urine dipstick NEGATIVE NEGATIVE   Bilirubin Urine NEGATIVE NEGATIVE   Ketones, ur NEGATIVE NEGATIVE mg/dL   Protein, ur NEGATIVE NEGATIVE mg/dL  Nitrite NEGATIVE NEGATIVE   Leukocytes,Ua TRACE (A) NEGATIVE   RBC / HPF 0-5 0 - 5 RBC/hpf   WBC, UA 0-5 0 - 5 WBC/hpf   Bacteria, UA NONE SEEN NONE SEEN   Squamous Epithelial / HPF 0-5 0 - 5 /HPF   Hyaline Casts, UA PRESENT     CT ABDOMEN PELVIS W CONTRAST Result Date: 09/16/2023 CLINICAL DATA:  Abdominal and back pain after fall on Wednesday. Unable to eat for 2 weeks. Emesis after eating. Recent weight loss of 20 pounds. Back surgery in November of 2024 EXAM: CT ABDOMEN AND PELVIS WITH CONTRAST CT Lumbar Spine with contrast TECHNIQUE: Technique: Multiplanar CT images of the lumbar spine were reconstructed from contemporary CT of the Abdomen and Pelvis. Multidetector CT imaging of the abdomen and pelvis was performed using the standard protocol following bolus administration of intravenous contrast. RADIATION DOSE REDUCTION: This exam was performed according to the departmental dose-optimization program which includes automated exposure control, adjustment of the mA and/or kV according to patient size and/or use of iterative reconstruction technique. CONTRAST:  80mL OMNIPAQUE  IOHEXOL  300 MG/ML  SOLN COMPARISON:  CT abdomen pelvis 10/05/2021 and MRI lumbar spine 06/22/2023 FINDINGS: ABDOMEN/PELVIS: Lower chest: No acute abnormality. Hepatobiliary: Unremarkable liver. Cholecystectomy. No biliary dilation. Pancreas: Fatty atrophy.  No acute abnormality. Spleen: Unremarkable. Adrenals/Urinary Tract: Stable adrenal glands and kidneys. No urinary calculi or hydronephrosis. Bladder is unremarkable. Stomach/Bowel: Sigmoid diverticulosis. Wall thickening and adjacent stranding about the sigmoid colon there is a tract of gas and fluid extending from the  inflamed sigmoid colon to an adjacent loop of small bowel compatible with coloenteric fistula (series 3/image 50 [id number]). Normal caliber large and small bowel. Moderate colonic stool burden. The appendix is not visualized.Stomach is within normal limits. Vascular/Lymphatic: Aortic atherosclerosis. No enlarged abdominal or pelvic lymph nodes. Reproductive: Status post hysterectomy. No adnexal masses. Other: Free fluid in the pelvis along the inflamed sigmoid colon and the colo enteric fistula. Musculoskeletal: No acute fracture in the pelvis. LUMBAR SPINE: Segmentation: 5 lumbar type vertebrae. Alignment: No evidence of traumatic listhesis. Chronic retrolisthesis of L3 and L5. Vertebrae: Superior endplate compression fracture of L1 is new since MRI 06/22/2023. There is 15 percent vertebral body height loss centrally. 2 mm of retropulsion of the superior endplate. Paraspinal and other soft tissues: See above. Disc levels: Multilevel spondylosis, disc space height loss, degenerative endplate changes greatest at L3-L4. Spinal canal narrowing is greatest at L4-L5 where it is moderate. IMPRESSION: 1. Acute sigmoid diverticulitis with coloenteric fistula. 2. Superior endplate compression fracture of L1 is new since MRI 06/22/2023. There is 15 percent vertebral body height loss centrally. 2 mm of retropulsion of the superior endplate. Electronically Signed   By: Rozell Cornet M.D.   On: 09/16/2023 17:59   CT L-SPINE NO CHARGE Result Date: 09/16/2023 CLINICAL DATA:  Abdominal and back pain after fall on Wednesday. Unable to eat for 2 weeks. Emesis after eating. Recent weight loss of 20 pounds. Back surgery in November of 2024 EXAM: CT ABDOMEN AND PELVIS WITH CONTRAST CT Lumbar Spine with contrast TECHNIQUE: Technique: Multiplanar CT images of the lumbar spine were reconstructed from contemporary CT of the Abdomen and Pelvis. Multidetector CT imaging of the abdomen and pelvis was performed using the standard protocol  following bolus administration of intravenous contrast. RADIATION DOSE REDUCTION: This exam was performed according to the departmental dose-optimization program which includes automated exposure control, adjustment of the mA and/or kV according to patient size and/or use of iterative reconstruction technique. CONTRAST:  80mL OMNIPAQUE  IOHEXOL  300 MG/ML  SOLN COMPARISON:  CT abdomen pelvis 10/05/2021 and MRI lumbar spine 06/22/2023 FINDINGS: ABDOMEN/PELVIS: Lower chest: No acute abnormality. Hepatobiliary: Unremarkable liver. Cholecystectomy. No biliary dilation. Pancreas: Fatty atrophy.  No acute abnormality. Spleen: Unremarkable. Adrenals/Urinary Tract: Stable adrenal glands and kidneys. No urinary calculi or hydronephrosis. Bladder is unremarkable. Stomach/Bowel: Sigmoid diverticulosis. Wall thickening and adjacent stranding about the sigmoid colon there is a tract of gas and fluid extending from the inflamed sigmoid colon to an adjacent loop of small bowel compatible with coloenteric fistula (series 3/image 50 [id number]). Normal caliber large and small bowel. Moderate colonic stool burden. The appendix is not visualized.Stomach is within normal limits. Vascular/Lymphatic: Aortic atherosclerosis. No enlarged abdominal or pelvic lymph nodes. Reproductive: Status post hysterectomy. No adnexal masses. Other: Free fluid in the pelvis along the inflamed sigmoid colon and the colo enteric fistula. Musculoskeletal: No acute fracture in the pelvis. LUMBAR SPINE: Segmentation: 5 lumbar type vertebrae. Alignment: No evidence of traumatic listhesis. Chronic retrolisthesis of L3 and L5. Vertebrae: Superior endplate compression fracture of L1 is new since MRI 06/22/2023. There is 15 percent vertebral body height loss centrally. 2 mm of retropulsion of the superior endplate. Paraspinal and other soft tissues: See above. Disc levels: Multilevel spondylosis, disc space height loss, degenerative endplate changes greatest at L3-L4.  Spinal canal narrowing is greatest at L4-L5 where it is moderate. IMPRESSION: 1. Acute sigmoid diverticulitis with coloenteric fistula. 2. Superior endplate compression fracture of L1 is new since MRI 06/22/2023. There is 15 percent vertebral body height loss centrally. 2 mm of retropulsion of the superior endplate. Electronically Signed   By: Rozell Cornet M.D.   On: 09/16/2023 17:59     Assessment and Plan: No notes have been filed under this hospital service. Service: Hospitalist  Principal Problem:   Diverticulitis Active Problems:   Essential hypertension   Gastroesophageal reflux disease   Hypothyroidism   Stage 3a chronic kidney disease (HCC)   Compression fracture of L1 lumbar vertebra (HCC)   Hyponatremia   Enterocolic fistula  Diverticulitis N.p.o. except with sips with medicine Cipro  and Flagyl  for antibiotics General Surgery consulted No surgery at this moment. Enterocolic fistula Will need to follow up with general surgery Compression fracture of L1 TLSO brace ordered Hyponatremia Will give IV fluids Hypothyroidism Continue levothyroxine  Stage IIIa chronic kidney disease Recheck creatinine in the morning Hypertension Home meds ordered   Advance Care Planning:   Code Status: Full Code confirmed by patient  Consults: Gen Surg  Family Communication:   Severity of Illness: The appropriate patient status for this patient is INPATIENT. Inpatient status is judged to be reasonable and necessary in order to provide the required intensity of service to ensure the patient's safety. The patient's presenting symptoms, physical exam findings, and initial radiographic and laboratory data in the context of their chronic comorbidities is felt to place them at high risk for further clinical deterioration. Furthermore, it is not anticipated that the patient will be medically stable for discharge from the hospital within 2 midnights of admission.   * I certify that at the  point of admission it is my clinical judgment that the patient will require inpatient hospital care spanning beyond 2 midnights from the point of admission due to high intensity of service, high risk for further deterioration and high frequency of surveillance required.*  Author: Layli Capshaw J Ronal Maybury, DO 09/16/2023 7:45 PM  For on call review [email or web address].

## 2023-09-16 NOTE — ED Triage Notes (Addendum)
 Pt fell on Wednesday, c/o abd and back pain.  Pt states she is not able to eat for past 2 weeks. + emesis after eating. Pt states she tripped over something with the fall, denies hitting her head. Denies any blood thinners. Pt states she has lost 20 lbs  Pt states she had back surgery in Nov. 2024  Unable to take oral temp due to pt sipping on soda in waiting room

## 2023-09-16 NOTE — ED Notes (Addendum)
 ED TO INPATIENT HANDOFF REPORT  ED Nurse Name and Phone #: Gaylan Kaufman [phone]  S Name/Age/Gender Felicia Frank 78 y.o. female Room/Bed: APFT21/APFT21  Code Status   Code Status: Not on file  Home/SNF/Other Home Patient oriented to: self, place, time, and situation Is this baseline? Yes   Triage Complete: Triage complete  Chief Complaint Diverticulitis [K57.92]  Triage Note Pt fell on Wednesday, c/o abd and back pain.  Pt states she is not able to eat for past 2 weeks. + emesis after eating. Pt states she tripped over something with the fall, denies hitting her head. Denies any blood thinners. Pt states she has lost 20 lbs  Pt states she had back surgery in Nov. 2024  Unable to take oral temp due to pt sipping on soda in waiting room    Allergies No Known Allergies  Level of Care/Admitting Diagnosis ED Disposition     ED Disposition  Admit   Condition  --   Comment  Hospital Area: [facility] [[id number]]  Level of Care: Med-Surg [16]  Covid Evaluation: Asymptomatic - no recent exposure (last 10 days) testing not required  Diagnosis: Diverticulitis [132440]  Admitting Physician: Malka Sea [[id number]]  Attending Physician: Malka Sea [[id number]]  Certification:: I certify this patient will need inpatient services for at least 2 midnights  Expected Medical Readiness: 09/20/2023          B Medical/Surgery History Past Medical History:  Diagnosis Date   HTN (hypertension)    Hypothyroidism    Past Surgical History:  Procedure Laterality Date   ABDOMINAL HYSTERECTOMY     BACK SURGERY     CHOLECYSTECTOMY     TONSILLECTOMY       A IV Location/Drains/Wounds Patient Lines/Drains/Airways Status     Active Line/Drains/Airways     Name Placement date Placement time Site Days   Peripheral IV 09/16/23 20 G 1" Right Antecubital 09/16/23  1305  Antecubital  less than 1            Intake/Output Last 24 hours No intake or output data  in the 24 hours ending 09/16/23 1922  Labs/Imaging Results for orders placed or performed during the hospital encounter of 09/16/23 (from the past 48 hours)  CBC with Differential     Status: Abnormal   Collection Time: 09/16/23 12:45 PM  Result Value Ref Range   WBC 13.1 (H) 4.0 - 10.5 K/uL   RBC 3.37 (L) 3.87 - 5.11 MIL/uL   Hemoglobin 10.6 (L) 12.0 - 15.0 g/dL   HCT 10.2 (L) 72.5 - 36.6 %   MCV 94.7 80.0 - 100.0 fL   MCH 31.5 26.0 - 34.0 pg   MCHC 33.2 30.0 - 36.0 g/dL   RDW 44.0 34.7 - 42.5 %   Platelets 278 150 - 400 K/uL   nRBC 0.0 0.0 - 0.2 %   Neutrophils Relative % 83 %   Neutro Abs 10.8 (H) 1.7 - 7.7 K/uL   Lymphocytes Relative 6 %   Lymphs Abs 0.8 0.7 - 4.0 K/uL   Monocytes Relative 6 %   Monocytes Absolute 0.8 0.1 - 1.0 K/uL   Eosinophils Relative 2 %   Eosinophils Absolute 0.3 0.0 - 0.5 K/uL   Basophils Relative 0 %   Basophils Absolute 0.1 0.0 - 0.1 K/uL   Immature Granulocytes 3 %   Abs Immature Granulocytes 0.38 (H) 0.00 - 0.07 K/uL    Comment: Performed at [facility], [address], [place], [place] [address]  Comprehensive metabolic panel     Status: Abnormal   Collection Time: 09/16/23 12:45 PM  Result Value Ref Range   Sodium 129 (L) 135 - 145 mmol/L   Potassium 4.7 3.5 - 5.1 mmol/L   Chloride 99 98 - 111 mmol/L   CO2 20 (L) 22 - 32 mmol/L   Glucose, Bld 102 (H) 70 - 99 mg/dL    Comment: Glucose reference range applies only to samples taken after fasting for at least 8 hours.   BUN 38 (H) 8 - 23 mg/dL   Creatinine, Ser 1.61 (H) 0.44 - 1.00 mg/dL   Calcium 9.2 8.9 - 09.6 mg/dL   Total Protein 6.7 6.5 - 8.1 g/dL   Albumin 2.9 (L) 3.5 - 5.0 g/dL   AST 15 15 - 41 U/L   ALT 23 0 - 44 U/L   Alkaline Phosphatase 73 38 - 126 U/L   Total Bilirubin 0.3 0.0 - 1.2 mg/dL   GFR, Estimated 37 (L) >60 mL/min    Comment: (NOTE) Calculated using the CKD-EPI Creatinine Equation (2021)    Anion gap 10 5 - 15    Comment: Performed at [facility], [address], [place], [place] [address]  Lipase, blood     Status: None   Collection Time: 09/16/23 12:45 PM  Result Value Ref Range   Lipase 27 11 - 51 U/L    Comment: Performed at [facility], [address] [place], [place] [address]  Urinalysis, Routine w reflex microscopic -Urine, Clean Catch     Status: Abnormal   Collection Time: 09/16/23  3:08 PM  Result Value Ref Range   Color, Urine YELLOW YELLOW   APPearance HAZY (A) CLEAR   Specific Gravity, Urine 1.015 1.005 - 1.030   pH 5.0 5.0 - 8.0   Glucose, UA NEGATIVE NEGATIVE mg/dL   Hgb urine dipstick NEGATIVE NEGATIVE   Bilirubin Urine NEGATIVE NEGATIVE   Ketones, ur NEGATIVE NEGATIVE mg/dL   Protein, ur NEGATIVE NEGATIVE mg/dL   Nitrite NEGATIVE NEGATIVE   Leukocytes,Ua TRACE (A) NEGATIVE   RBC / HPF 0-5 0 - 5 RBC/hpf   WBC, UA 0-5 0 - 5 WBC/hpf   Bacteria, UA NONE SEEN NONE SEEN   Squamous Epithelial / HPF 0-5 0 - 5 /HPF   Hyaline Casts, UA PRESENT     Comment: Performed at [facility], [address], [place], [place] [address]   CT ABDOMEN PELVIS W CONTRAST Result Date: 09/16/2023 CLINICAL DATA:  Abdominal and back pain after fall on Wednesday. Unable to eat for 2 weeks. Emesis after eating. Recent weight loss of 20 pounds. Back surgery in November of 2024 EXAM: CT ABDOMEN AND PELVIS WITH CONTRAST CT Lumbar Spine with contrast TECHNIQUE: Technique: Multiplanar CT images of the lumbar spine were reconstructed from contemporary CT of the Abdomen and Pelvis. Multidetector CT imaging of the abdomen and pelvis was performed using the standard protocol following bolus administration of intravenous contrast. RADIATION DOSE REDUCTION: This exam was performed according to the departmental dose-optimization program which includes automated exposure control, adjustment of the mA and/or kV according to patient size and/or use of iterative reconstruction technique. CONTRAST:  80mL OMNIPAQUE  IOHEXOL  300 MG/ML  SOLN COMPARISON:  CT abdomen pelvis 10/05/2021  and MRI lumbar spine 06/22/2023 FINDINGS: ABDOMEN/PELVIS: Lower chest: No acute abnormality. Hepatobiliary: Unremarkable liver. Cholecystectomy. No biliary dilation. Pancreas: Fatty atrophy.  No acute abnormality. Spleen: Unremarkable. Adrenals/Urinary Tract: Stable adrenal glands and kidneys. No urinary calculi or hydronephrosis. Bladder is unremarkable. Stomach/Bowel:  Sigmoid diverticulosis. Wall thickening and adjacent stranding about the sigmoid colon there is a tract of gas and fluid extending from the inflamed sigmoid colon to an adjacent loop of small bowel compatible with coloenteric fistula (series 3/image 50 9-66). Normal caliber large and small bowel. Moderate colonic stool burden. The appendix is not visualized.Stomach is within normal limits. Vascular/Lymphatic: Aortic atherosclerosis. No enlarged abdominal or pelvic lymph nodes. Reproductive: Status post hysterectomy. No adnexal masses. Other: Free fluid in the pelvis along the inflamed sigmoid colon and the colo enteric fistula. Musculoskeletal: No acute fracture in the pelvis. LUMBAR SPINE: Segmentation: 5 lumbar type vertebrae. Alignment: No evidence of traumatic listhesis. Chronic retrolisthesis of L3 and L5. Vertebrae: Superior endplate compression fracture of L1 is new since MRI 06/22/2023. There is 15 percent vertebral body height loss centrally. 2 mm of retropulsion of the superior endplate. Paraspinal and other soft tissues: See above. Disc levels: Multilevel spondylosis, disc space height loss, degenerative endplate changes greatest at L3-L4. Spinal canal narrowing is greatest at L4-L5 where it is moderate. IMPRESSION: 1. Acute sigmoid diverticulitis with coloenteric fistula. 2. Superior endplate compression fracture of L1 is new since MRI 06/22/2023. There is 15 percent vertebral body height loss centrally. 2 mm of retropulsion of the superior endplate. Electronically Signed   By: Rozell Cornet M.D.   On: 09/16/2023 17:59   CT L-SPINE NO  CHARGE Result Date: 09/16/2023 CLINICAL DATA:  Abdominal and back pain after fall on Wednesday. Unable to eat for 2 weeks. Emesis after eating. Recent weight loss of 20 pounds. Back surgery in November of 2024 EXAM: CT ABDOMEN AND PELVIS WITH CONTRAST CT Lumbar Spine with contrast TECHNIQUE: Technique: Multiplanar CT images of the lumbar spine were reconstructed from contemporary CT of the Abdomen and Pelvis. Multidetector CT imaging of the abdomen and pelvis was performed using the standard protocol following bolus administration of intravenous contrast. RADIATION DOSE REDUCTION: This exam was performed according to the departmental dose-optimization program which includes automated exposure control, adjustment of the mA and/or kV according to patient size and/or use of iterative reconstruction technique. CONTRAST:  80mL OMNIPAQUE  IOHEXOL  300 MG/ML  SOLN COMPARISON:  CT abdomen pelvis 10/05/2021 and MRI lumbar spine 06/22/2023 FINDINGS: ABDOMEN/PELVIS: Lower chest: No acute abnormality. Hepatobiliary: Unremarkable liver. Cholecystectomy. No biliary dilation. Pancreas: Fatty atrophy.  No acute abnormality. Spleen: Unremarkable. Adrenals/Urinary Tract: Stable adrenal glands and kidneys. No urinary calculi or hydronephrosis. Bladder is unremarkable. Stomach/Bowel: Sigmoid diverticulosis. Wall thickening and adjacent stranding about the sigmoid colon there is a tract of gas and fluid extending from the inflamed sigmoid colon to an adjacent loop of small bowel compatible with coloenteric fistula (series 3/image 50 9-66). Normal caliber large and small bowel. Moderate colonic stool burden. The appendix is not visualized.Stomach is within normal limits. Vascular/Lymphatic: Aortic atherosclerosis. No enlarged abdominal or pelvic lymph nodes. Reproductive: Status post hysterectomy. No adnexal masses. Other: Free fluid in the pelvis along the inflamed sigmoid colon and the colo enteric fistula. Musculoskeletal: No acute  fracture in the pelvis. LUMBAR SPINE: Segmentation: 5 lumbar type vertebrae. Alignment: No evidence of traumatic listhesis. Chronic retrolisthesis of L3 and L5. Vertebrae: Superior endplate compression fracture of L1 is new since MRI 06/22/2023. There is 15 percent vertebral body height loss centrally. 2 mm of retropulsion of the superior endplate. Paraspinal and other soft tissues: See above. Disc levels: Multilevel spondylosis, disc space height loss, degenerative endplate changes greatest at L3-L4. Spinal canal narrowing is greatest at L4-L5 where it is moderate. IMPRESSION: 1. Acute sigmoid  diverticulitis with coloenteric fistula. 2. Superior endplate compression fracture of L1 is new since MRI 06/22/2023. There is 15 percent vertebral body height loss centrally. 2 mm of retropulsion of the superior endplate. Electronically Signed   By: Rozell Cornet M.D.   On: 09/16/2023 17:59    Pending Labs Unresulted Labs (From admission, onward)    None       Vitals/Pain Today's Vitals   09/16/23 1830 09/16/23 1900 09/16/23 1915 09/16/23 1916  BP: (!) 166/95 (!) 166/69    Pulse: 67     Resp:      Temp:    98.1 F (36.7 C)  TempSrc:    Oral  SpO2: 93%     Weight:      Height:      PainSc:   5      Isolation Precautions No active isolations  Medications Medications  ciprofloxacin  (CIPRO ) IVPB 400 mg (400 mg Intravenous New Bag/Given 09/16/23 1913)  metroNIDAZOLE  (FLAGYL ) IVPB 500 mg (500 mg Intravenous New Bag/Given 09/16/23 1914)  ondansetron  (ZOFRAN ) injection 4 mg (4 mg Intravenous Given 09/16/23 1309)  morphine  (PF) 4 MG/ML injection 4 mg (4 mg Intravenous Given 09/16/23 1310)  iohexol  (OMNIPAQUE ) 300 MG/ML solution 80 mL (80 mLs Intravenous Contrast Given 09/16/23 1548)  morphine  (PF) 4 MG/ML injection 4 mg (4 mg Intravenous Given 09/16/23 1709)  sodium chloride  0.9 % bolus 1,000 mL (1,000 mLs Intravenous New Bag/Given 09/16/23 1714)    Mobility walks       R Recommendations: See  Admitting Provider Note  Report given to: [place]

## 2023-09-16 NOTE — Progress Notes (Signed)
   09/16/23 2205  TOC Brief Assessment  Insurance and Status Reviewed  Patient has primary care physician Yes  Home environment has been reviewed From home  Prior level of function: Independent  Prior/Current Home Services No current home services  Social Drivers of Health Review SDOH reviewed no interventions necessary  Readmission risk has been reviewed Yes  Transition of care needs no transition of care needs at this time   Transition of Care Department [facility]) has reviewed patient and no other TOC needs have been identified at this time. We will continue to monitor patient advancement through interdisciplinary progression rounds. If new patient needs arise, please place a TOC consult.

## 2023-09-16 NOTE — ED Provider Notes (Signed)
 [place] [facility] AT [facility] Provider Note   CSN: [id number] Arrival date & time: 09/16/23  1019     History  Chief Complaint  Patient presents with   Fall    Felicia Frank is a 78 y.o. female with a history including hypertension, hypothyroidism history of dysphagia and constipation, surgical history significant for cholecystectomy and abdominal hysterectomy presenting for evaluation of abdominal and back pain.  She states she has had a poor appetite for the past 2 weeks and has endorsed multiple episodes of post meal emesis, resulting in a nearly 20 pound weight loss in that time.  She also has complaint of a trip and fall which occurred 2 days ago, she denies hitting her head, denies LOC, dizziness, has been ambulatory since this event and denies any pain in her extremities although she has some bruising on her lower legs and states she also landed on her left side causing some soreness along her left flank and lower back.  She denies fevers or chills, denies diarrhea or current constipation.  The history is provided by the patient and a relative (dg at bedside).       Home Medications Prior to Admission medications   Medication Sig Start Date End Date Taking? Authorizing Provider  aspirin EC 81 MG tablet Take 81 mg by mouth daily. Swallow whole.    [provider]  carvedilol  (COREG ) 25 MG tablet Take 25 mg by mouth 2 (two) times daily. 12/02/20   [provider]  cloNIDine (CATAPRES) 0.1 MG tablet 1-2 times as needed 06/09/20   [provider]  docusate sodium  (COLACE) 100 MG capsule Take 200 mg by mouth at bedtime.    [provider]  doxazosin  (CARDURA ) 8 MG tablet Take 8 mg by mouth at bedtime. 12/02/20   [provider]  famotidine  (PEPCID ) 40 MG tablet Take 40 mg by mouth daily. 11/01/20   [provider]  levothyroxine  (SYNTHROID ) 50 MCG tablet daily. 03/15/17   [provider]   spironolactone-hydrochlorothiazide (ALDACTAZIDE) 25-25 MG tablet daily. 06/09/20   [provider]  traMADol (ULTRAM) 50 MG tablet daily. 01/06/21   [provider]  zolpidem  (AMBIEN ) 10 MG tablet Take 10 mg by mouth at bedtime as needed. 12/11/20   [provider]      Allergies    Patient has no known allergies.    Review of Systems   Review of Systems  Constitutional:  Positive for appetite change and unexpected weight change. Negative for chills and fever.  HENT:  Negative for congestion and sore throat.   Eyes: Negative.   Respiratory:  Negative for chest tightness and shortness of breath.   Cardiovascular:  Negative for chest pain.  Gastrointestinal:  Positive for abdominal pain, nausea and vomiting.  Genitourinary: Negative.   Musculoskeletal:  Negative for arthralgias, joint swelling and neck pain.  Skin: Negative.  Negative for rash and wound.  Neurological:  Negative for dizziness, weakness, light-headedness, numbness and headaches.  Psychiatric/Behavioral: Negative.      Physical Exam Updated Vital Signs BP 129/63 (BP Location: Left Arm)   Pulse (!) 59   Temp 97.8 F (36.6 C) (Oral)   Resp 18   Ht 5' 3.5" (1.613 m)   Wt 78 kg   SpO2 96%   BMI 29.99 kg/m  Physical Exam Vitals and nursing note reviewed.  Constitutional:      Appearance: She is well-developed.  HENT:     Head: Normocephalic and atraumatic.  Eyes:     Conjunctiva/sclera: Conjunctivae normal.  Cardiovascular:     Rate and Rhythm: Normal rate and regular rhythm.     Heart sounds: Normal heart sounds.  Pulmonary:     Effort: Pulmonary effort is normal.     Breath sounds: Normal breath sounds. No wheezing.  Abdominal:     General: Bowel sounds are normal.     Palpations: Abdomen is soft.     Tenderness: There is abdominal tenderness in the left upper quadrant and left lower quadrant. There is no guarding or rebound.  Musculoskeletal:        General: Normal range of  motion.     Cervical back: Normal range of motion.     Lumbar back: Bony tenderness present. No swelling.     Comments: Ttp midline lumbar.  Well healed midline incision.  No erythema.   Skin:    General: Skin is warm and dry.  Neurological:     Mental Status: She is alert.     ED Results / Procedures / Treatments   Labs (all labs ordered are listed, but only abnormal results are displayed) Labs Reviewed  CBC WITH DIFFERENTIAL/PLATELET - Abnormal; Notable for the following components:      Result Value   WBC 13.1 (*)    RBC 3.37 (*)    Hemoglobin 10.6 (*)    HCT 31.9 (*)    Neutro Abs 10.8 (*)    Abs Immature Granulocytes 0.38 (*)    All other components within normal limits  COMPREHENSIVE METABOLIC PANEL WITH GFR - Abnormal; Notable for the following components:   Sodium 129 (*)    CO2 20 (*)    Glucose, Bld 102 (*)    BUN 38 (*)    Creatinine, Ser 1.46 (*)    Albumin 2.9 (*)    GFR, Estimated 37 (*)    All other components within normal limits  URINALYSIS, ROUTINE W REFLEX MICROSCOPIC - Abnormal; Notable for the following components:   APPearance HAZY (*)    Leukocytes,Ua TRACE (*)    All other components within normal limits  LIPASE, BLOOD    EKG None  Radiology CT ABDOMEN PELVIS W CONTRAST Result Date: 09/16/2023 CLINICAL DATA:  Abdominal and back pain after fall on Wednesday. Unable to eat for 2 weeks. Emesis after eating. Recent weight loss of 20 pounds. Back surgery in November of 2024 EXAM: CT ABDOMEN AND PELVIS WITH CONTRAST CT Lumbar Spine with contrast TECHNIQUE: Technique: Multiplanar CT images of the lumbar spine were reconstructed from contemporary CT of the Abdomen and Pelvis. Multidetector CT imaging of the abdomen and pelvis was performed using the standard protocol following bolus administration of intravenous contrast. RADIATION DOSE REDUCTION: This exam was performed according to the departmental dose-optimization program which includes automated  exposure control, adjustment of the mA and/or kV according to patient size and/or use of iterative reconstruction technique. CONTRAST:  80mL OMNIPAQUE  IOHEXOL  300 MG/ML  SOLN COMPARISON:  CT abdomen pelvis 10/05/2021 and MRI lumbar spine 06/22/2023 FINDINGS: ABDOMEN/PELVIS: Lower chest: No acute abnormality. Hepatobiliary: Unremarkable liver. Cholecystectomy. No biliary dilation. Pancreas: Fatty atrophy.  No acute abnormality. Spleen: Unremarkable. Adrenals/Urinary Tract: Stable adrenal glands and kidneys. No urinary calculi or hydronephrosis. Bladder is unremarkable. Stomach/Bowel: Sigmoid diverticulosis. Wall thickening and adjacent stranding about the sigmoid colon there is a tract of gas and fluid extending from the inflamed sigmoid colon to an adjacent loop of small bowel compatible with coloenteric fistula (series 3/image 50 [id number]). Normal caliber large and  small bowel. Moderate colonic stool burden. The appendix is not visualized.Stomach is within normal limits. Vascular/Lymphatic: Aortic atherosclerosis. No enlarged abdominal or pelvic lymph nodes. Reproductive: Status post hysterectomy. No adnexal masses. Other: Free fluid in the pelvis along the inflamed sigmoid colon and the colo enteric fistula. Musculoskeletal: No acute fracture in the pelvis. LUMBAR SPINE: Segmentation: 5 lumbar type vertebrae. Alignment: No evidence of traumatic listhesis. Chronic retrolisthesis of L3 and L5. Vertebrae: Superior endplate compression fracture of L1 is new since MRI 06/22/2023. There is 15 percent vertebral body height loss centrally. 2 mm of retropulsion of the superior endplate. Paraspinal and other soft tissues: See above. Disc levels: Multilevel spondylosis, disc space height loss, degenerative endplate changes greatest at L3-L4. Spinal canal narrowing is greatest at L4-L5 where it is moderate. IMPRESSION: 1. Acute sigmoid diverticulitis with coloenteric fistula. 2. Superior endplate compression fracture of L1 is new  since MRI 06/22/2023. There is 15 percent vertebral body height loss centrally. 2 mm of retropulsion of the superior endplate. Electronically Signed   By: Rozell Cornet M.D.   On: 09/16/2023 17:59   CT L-SPINE NO CHARGE Result Date: 09/16/2023 CLINICAL DATA:  Abdominal and back pain after fall on Wednesday. Unable to eat for 2 weeks. Emesis after eating. Recent weight loss of 20 pounds. Back surgery in November of 2024 EXAM: CT ABDOMEN AND PELVIS WITH CONTRAST CT Lumbar Spine with contrast TECHNIQUE: Technique: Multiplanar CT images of the lumbar spine were reconstructed from contemporary CT of the Abdomen and Pelvis. Multidetector CT imaging of the abdomen and pelvis was performed using the standard protocol following bolus administration of intravenous contrast. RADIATION DOSE REDUCTION: This exam was performed according to the departmental dose-optimization program which includes automated exposure control, adjustment of the mA and/or kV according to patient size and/or use of iterative reconstruction technique. CONTRAST:  80mL OMNIPAQUE  IOHEXOL  300 MG/ML  SOLN COMPARISON:  CT abdomen pelvis 10/05/2021 and MRI lumbar spine 06/22/2023 FINDINGS: ABDOMEN/PELVIS: Lower chest: No acute abnormality. Hepatobiliary: Unremarkable liver. Cholecystectomy. No biliary dilation. Pancreas: Fatty atrophy.  No acute abnormality. Spleen: Unremarkable. Adrenals/Urinary Tract: Stable adrenal glands and kidneys. No urinary calculi or hydronephrosis. Bladder is unremarkable. Stomach/Bowel: Sigmoid diverticulosis. Wall thickening and adjacent stranding about the sigmoid colon there is a tract of gas and fluid extending from the inflamed sigmoid colon to an adjacent loop of small bowel compatible with coloenteric fistula (series 3/image 50 [id number]). Normal caliber large and small bowel. Moderate colonic stool burden. The appendix is not visualized.Stomach is within normal limits. Vascular/Lymphatic: Aortic atherosclerosis. No enlarged  abdominal or pelvic lymph nodes. Reproductive: Status post hysterectomy. No adnexal masses. Other: Free fluid in the pelvis along the inflamed sigmoid colon and the colo enteric fistula. Musculoskeletal: No acute fracture in the pelvis. LUMBAR SPINE: Segmentation: 5 lumbar type vertebrae. Alignment: No evidence of traumatic listhesis. Chronic retrolisthesis of L3 and L5. Vertebrae: Superior endplate compression fracture of L1 is new since MRI 06/22/2023. There is 15 percent vertebral body height loss centrally. 2 mm of retropulsion of the superior endplate. Paraspinal and other soft tissues: See above. Disc levels: Multilevel spondylosis, disc space height loss, degenerative endplate changes greatest at L3-L4. Spinal canal narrowing is greatest at L4-L5 where it is moderate. IMPRESSION: 1. Acute sigmoid diverticulitis with coloenteric fistula. 2. Superior endplate compression fracture of L1 is new since MRI 06/22/2023. There is 15 percent vertebral body height loss centrally. 2 mm of retropulsion of the superior endplate. Electronically Signed   By: Christell Cove.D.  On: 09/16/2023 17:59    Procedures Procedures    Medications Ordered in ED Medications  ciprofloxacin  (CIPRO ) IVPB 400 mg (has no administration in time range)  metroNIDAZOLE  (FLAGYL ) IVPB 500 mg (has no administration in time range)  ondansetron  (ZOFRAN ) injection 4 mg (4 mg Intravenous Given 09/16/23 1309)  morphine  (PF) 4 MG/ML injection 4 mg (4 mg Intravenous Given 09/16/23 1310)  iohexol  (OMNIPAQUE ) 300 MG/ML solution 80 mL (80 mLs Intravenous Contrast Given 09/16/23 1548)  morphine  (PF) 4 MG/ML injection 4 mg (4 mg Intravenous Given 09/16/23 1709)  sodium chloride  0.9 % bolus 1,000 mL (1,000 mLs Intravenous New Bag/Given 09/16/23 1714)    ED Course/ Medical Decision Making/ A&P                                 Medical Decision Making Patient with an approximate 2-week history of left-sided abdominal discomfort along with  nausea, vomiting and general loss of appetite along with ongoing and worsening weakness.  Endorses nearly 20 pound weight loss in this time, tripped and fell 2 days ago landing on her left side and now has persistent low back pain.  Differential diagnosis including abdominal/intestinal infection, constipation, bowel obstruction, dehydration, electrolyte imbalance, diverticulitis, PUD, acute cholecystitis.  Amount and/or Complexity of Data Reviewed Labs: ordered.    Details: Labs reviewed, significant for WBC count of 13.1, she is anemic with a hemoglobin of 10.6, this is a stable finding.  She is dehydrated with a BUN of 38 and a creatinine of 1.46 and has received IV fluids while here.  Her last creatinine 1 year ago was 1.27.  She also has hyponatremia with a sodium of 129, urine is clean. Radiology: ordered.    Details: CT reviewed, sigmoid diverticulitis with a fistula involving the small bowel.  She also has an L1 compression fracture. Discussion of management or test interpretation with external provider(s): Patient's CT results discussed with Dr. Larrie Po of general surgery, recommends admission for IV antibiotics and he will see the patient in the morning.  He did state this patient will need to be fully treated for diverticulitis before consideration of surgery, probably not during this admission.  Call placed to the hospitalist for admission.  Discussed with Dr. Cathyann Cobia who agrees with admission.  Risk Decision regarding hospitalization.           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

## 2023-09-17 DIAGNOSIS — K632 Fistula of intestine: Secondary | ICD-10-CM | POA: Diagnosis not present

## 2023-09-17 DIAGNOSIS — K5732 Diverticulitis of large intestine without perforation or abscess without bleeding: Secondary | ICD-10-CM | POA: Diagnosis not present

## 2023-09-17 DIAGNOSIS — K5792 Diverticulitis of intestine, part unspecified, without perforation or abscess without bleeding: Secondary | ICD-10-CM

## 2023-09-17 LAB — BASIC METABOLIC PANEL WITH GFR
Anion gap: 13 (ref 5–15)
Anion gap: 8 (ref 5–15)
Anion gap: 11 (ref 5–15)
BUN: 37 mg/dL — ABNORMAL HIGH (ref 8–23)
BUN: 40 mg/dL — ABNORMAL HIGH (ref 8–23)
BUN: 44 mg/dL — ABNORMAL HIGH (ref 8–23)
CO2: 17 mmol/L — ABNORMAL LOW (ref 22–32)
CO2: 19 mmol/L — ABNORMAL LOW (ref 22–32)
CO2: 15 mmol/L — ABNORMAL LOW (ref 22–32)
Calcium: 8.5 mg/dL — ABNORMAL LOW (ref 8.9–10.3)
Calcium: 8.6 mg/dL — ABNORMAL LOW (ref 8.9–10.3)
Calcium: 8.7 mg/dL — ABNORMAL LOW (ref 8.9–10.3)
Chloride: 99 mmol/L (ref 98–111)
Chloride: 101 mmol/L (ref 98–111)
Chloride: 102 mmol/L (ref 98–111)
Creatinine, Ser: 1.82 mg/dL — ABNORMAL HIGH (ref 0.44–1.00)
Creatinine, Ser: 2.05 mg/dL — ABNORMAL HIGH (ref 0.44–1.00)
Creatinine, Ser: 2.02 mg/dL — ABNORMAL HIGH (ref 0.44–1.00)
GFR, Estimated: 28 mL/min — ABNORMAL LOW (ref >60)
GFR, Estimated: 24 mL/min — ABNORMAL LOW (ref >60)
GFR, Estimated: 25 mL/min — ABNORMAL LOW (ref >60)
Glucose, Bld: 83 mg/dL (ref 70–99)
Glucose, Bld: 108 mg/dL — ABNORMAL HIGH (ref 70–99)
Glucose, Bld: 101 mg/dL — ABNORMAL HIGH (ref 70–99)
Potassium: 5.1 mmol/L (ref 3.5–5.1)
Potassium: 5.7 mmol/L — ABNORMAL HIGH (ref 3.5–5.1)
Potassium: 5.5 mmol/L — ABNORMAL HIGH (ref 3.5–5.1)
Sodium: 129 mmol/L — ABNORMAL LOW (ref 135–145)
Sodium: 128 mmol/L — ABNORMAL LOW (ref 135–145)
Sodium: 128 mmol/L — ABNORMAL LOW (ref 135–145)

## 2023-09-17 LAB — CBC
HCT: 29.9 % — ABNORMAL LOW (ref 36.0–46.0)
Hemoglobin: 9.5 g/dL — ABNORMAL LOW (ref 12.0–15.0)
MCH: 31.1 pg (ref 26.0–34.0)
MCHC: 31.8 g/dL (ref 30.0–36.0)
MCV: 98 fL (ref 80.0–100.0)
Platelets: 246 10*3/uL (ref 150–400)
RBC: 3.05 MIL/uL — ABNORMAL LOW (ref 3.87–5.11)
RDW: 14.2 % (ref 11.5–15.5)
WBC: 23 10*3/uL — ABNORMAL HIGH (ref 4.0–10.5)
nRBC: 0 % (ref 0.0–0.2)

## 2023-09-17 MED ORDER — ONDANSETRON HCL 4 MG/2ML IJ SOLN
4.0000 mg | Freq: Four times a day (QID) | INTRAMUSCULAR | Status: DC | PRN
Start: 2023-09-17 — End: 2023-09-27
  Administered 2023-09-17 – 2023-09-27 (×28): 4 mg via INTRAVENOUS
  Filled 2023-09-17 (×28): qty 2

## 2023-09-17 MED ORDER — SODIUM CHLORIDE 0.9 % IV BOLUS
500.0000 mL | Freq: Once | INTRAVENOUS | Status: AC
  Administered 2023-09-17: 500 mL via INTRAVENOUS

## 2023-09-17 MED ORDER — SODIUM ZIRCONIUM CYCLOSILICATE 5 G PO PACK
5.0000 g | PACK | Freq: Three times a day (TID) | ORAL | Status: AC
  Administered 2023-09-17: 5 g via ORAL
  Filled 2023-09-17: qty 1

## 2023-09-17 MED ORDER — SODIUM CHLORIDE 0.9 % IV SOLN: INTRAVENOUS | Status: DC

## 2023-09-17 MED ORDER — OXYCODONE HCL 5 MG PO TABS
5.0000 mg | ORAL_TABLET | ORAL | Status: DC | PRN
  Administered 2023-09-18 – 2023-09-27 (×6): 5 mg via ORAL
  Filled 2023-09-17 (×7): qty 1

## 2023-09-17 MED ORDER — HYDROMORPHONE HCL 1 MG/ML IJ SOLN
1.0000 mg | INTRAMUSCULAR | Status: DC | PRN
  Administered 2023-09-17 – 2023-09-28 (×23): 1 mg via INTRAVENOUS
  Filled 2023-09-17 (×23): qty 1

## 2023-09-17 MED ORDER — CIPROFLOXACIN IN D5W 400 MG/200ML IV SOLN: 400.0000 mg | INTRAVENOUS | Status: DC

## 2023-09-17 MED ORDER — SODIUM ZIRCONIUM CYCLOSILICATE 5 G PO PACK: 5.0000 g | PACK | Freq: Three times a day (TID) | ORAL | Status: DC

## 2023-09-17 MED ORDER — ENOXAPARIN SODIUM 30 MG/0.3ML IJ SOSY
30.0000 mg | PREFILLED_SYRINGE | INTRAMUSCULAR | Status: DC
  Administered 2023-09-17 – 2023-09-19 (×3): 30 mg via SUBCUTANEOUS
  Filled 2023-09-17 (×3): qty 0.3

## 2023-09-17 MED ORDER — PROCHLORPERAZINE 25 MG RE SUPP
25.0000 mg | Freq: Once | RECTAL | Status: AC
  Administered 2023-09-17: 25 mg via RECTAL
  Filled 2023-09-17 (×2): qty 1

## 2023-09-17 NOTE — Progress Notes (Signed)
 PROGRESS NOTE  Felicia Frank, is a 78 y.o. female, DOB - [birth year], FGH:[id number]  Admit date - 09/16/2023   Admitting Physician Malka Sea, DO  Outpatient Primary MD for the patient is [name], [name], MD  LOS - 1  Chief Complaint  Patient presents with   Fall      Brief Narrative:  78 y.o. 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 as well as superior endplate compression fracture of L1   -Assessment and Plan: 1)Acute Sigmoid Diverticulitis with coloenteric fistula--- discussed with general surgeon who advises nonoperative approach at this time -WBC 13.1 >>23.0-- -continue Cipro  and Flagyl  - As needed Dilaudid  and oxycodone  for pain control - As needed Zofran  for nausea and vomiting  2)AKI----acute kidney injury on CKD stage - 3A - Creatinine on admission= 1.46, baseline creatinine = 1.3 (on 06/07/23) per Care Everywhere    , creatinine is now= 2.05  , Renally adjust medications, avoid nephrotoxic agents / dehydration  / hypotension  3)L1 Compression Fx---Superior endplate compression fracture of L1 is new since MRI 06/22/2023. There is 15 percent vertebral body height loss centrally. 2 mm of retropulsion of the superior endplate. - As needed pain medication - TLSO brace advised - Outpatient follow-up with spine specialist  4)HypoNatremia--due to dehydration, emesis with GI losses and poor oral intake - IV fluids as ordered  5)HTN--stable, continue Coreg  and Cardura   6)Hypothyroidism--- continue levothyroxine   7)Hyperkalemia--potassium up to 5.7 worsening renal function/AKI on CKD noted -- Lokelma  as ordered  Status is: Inpatient  Disposition: The patient is from: Home              Anticipated d/c is to: Home              Anticipated d/c date is: > 3 days               Patient currently is not medically stable to d/c. Barriers: Not Clinically Stable-   Code Status :  -  Code Status: Full Code   Family Communication:    NA (patient is alert, awake and coherent)   DVT Prophylaxis  :   - SCDs   enoxaparin  (LOVENOX ) injection 30 mg Start: 09/17/23 2200   Lab Results  Component Value Date   PLT 246 09/17/2023    Inpatient Medications  Scheduled Meds:  carvedilol   25 mg Oral BID   docusate sodium   200 mg Oral QHS   doxazosin   8 mg Oral QHS   enoxaparin  (LOVENOX ) injection  30 mg Subcutaneous Q24H   famotidine   40 mg Oral Daily   levothyroxine   50 mcg Oral Q0600   Continuous Infusions:  sodium chloride  75 mL/hr at 09/17/23 0524   [START ON 09/18/2023] ciprofloxacin      metronidazole  500 mg (09/17/23 0546)   PRN Meds:.HYDROmorphone  (DILAUDID ) injection, zolpidem    Anti-infectives (From admission, onward)    Start     Dose/Rate Route Frequency Ordered Stop   09/18/23 1000  ciprofloxacin  (CIPRO ) IVPB 400 mg        400 mg 200 mL/hr over 60 Minutes Intravenous Every 24 hours 09/17/23 1140     09/17/23 0600  ciprofloxacin  (CIPRO ) IVPB 400 mg  Status:  Discontinued        400 mg 200 mL/hr over 60 Minutes Intravenous Every 12 hours 09/16/23 1954 09/17/23 1140  09/17/23 0600  metroNIDAZOLE  (FLAGYL ) IVPB 500 mg        500 mg 100 mL/hr over 60 Minutes Intravenous Every 12 hours 09/16/23 1954     09/16/23 1815  ciprofloxacin  (CIPRO ) IVPB 400 mg        400 mg 200 mL/hr over 60 Minutes Intravenous  Once 09/16/23 1805 09/16/23 2013   09/16/23 1815  metroNIDAZOLE  (FLAGYL ) IVPB 500 mg        500 mg 100 mL/hr over 60 Minutes Intravenous  Once 09/16/23 1805 09/16/23 2014        Subjective: Felicia Frank today has no fevers, no further emesis,  No chest pain,   - Abdominal pain improving with pain medications  -Tolerating sips and ice chips-- =-Willing to try liquid diet  Objective: Vitals:   09/17/23 0515 09/17/23 0636 09/17/23 0952  09/17/23 1315  BP: (!) 94/55 (!) 110/53 103/60 (!) 106/41  Pulse: 78 70 73 68  Resp: 18 (!) 24 (!) 21   Temp: 99.1 F (37.3 C) 98.3 F (36.8 C) 98.4 F (36.9 C) 98.1 F (36.7 C)  TempSrc: Oral Oral Oral Oral  SpO2: 99% 98% 97% 97%  Weight:      Height:        Intake/Output Summary (Last 24 hours) at 09/17/2023 1433 Last data filed at 09/17/2023 1256 Gross per 24 hour  Intake 360 ml  Output 1 ml  Net 359 ml   Filed Weights   09/16/23 1117 09/16/23 1946  Weight: 78 kg 79.9 kg   Physical Exam  Gen:- Awake Alert,  in no apparent distress  HEENT:- [place].AT, No sclera icterus Neck-Supple Neck,No JVD,.  Lungs-  CTAB , fair symmetrical air movement CV- S1, S2 normal, regular  Abd-  +ve B.Sounds, Abd Soft, left lower quadrant tenderness, some voluntary guarding, no significant rebound    Extremity/Skin:- No  edema, pedal pulses present  Psych-affect is appropriate, oriented x3 Neuro-no new focal deficits, no tremors  Data Reviewed: I have personally reviewed following labs and imaging studies  CBC: Recent Labs  Lab 09/16/23 1245 09/17/23 0421  WBC 13.1* 23.0*  NEUTROABS 10.8*  --   HGB 10.6* 9.5*  HCT 31.9* 29.9*  MCV 94.7 98.0  PLT 278 246   Basic Metabolic Panel: Recent Labs  Lab 09/16/23 1245 09/17/23 0421 09/17/23 1046  NA 129* 129* 128*  K 4.7 5.1 5.7*  CL 99 99 101  CO2 20* 17* 19*  GLUCOSE 102* 83 108*  BUN 38* 37* 40*  CREATININE 1.46* 1.82* 2.05*  CALCIUM 9.2 8.5* 8.6*   GFR: Estimated Creatinine Clearance: 22.9 mL/min (A) (by C-G formula based on SCr of 2.05 mg/dL (H)). Liver Function Tests: Recent Labs  Lab 09/16/23 1245  AST 15  ALT 23  ALKPHOS 73  BILITOT 0.3  PROT 6.7  ALBUMIN 2.9*   Radiology Studies: CT ABDOMEN PELVIS W CONTRAST Result Date: 09/16/2023 CLINICAL DATA:  Abdominal and back pain after fall on Wednesday. Unable to eat for 2 weeks. Emesis after eating. Recent weight loss of 20 pounds. Back surgery in November of 2024 EXAM:  CT ABDOMEN AND PELVIS WITH CONTRAST CT Lumbar Spine with contrast TECHNIQUE: Technique: Multiplanar CT images of the lumbar spine were reconstructed from contemporary CT of the Abdomen and Pelvis. Multidetector CT imaging of the abdomen and pelvis was performed using the standard protocol following bolus administration of intravenous contrast. RADIATION DOSE REDUCTION: This exam was performed according to the departmental dose-optimization program which includes automated exposure control, adjustment  of the mA and/or kV according to patient size and/or use of iterative reconstruction technique. CONTRAST:  80mL OMNIPAQUE  IOHEXOL  300 MG/ML  SOLN COMPARISON:  CT abdomen pelvis 10/05/2021 and MRI lumbar spine 06/22/2023 FINDINGS: ABDOMEN/PELVIS: Lower chest: No acute abnormality. Hepatobiliary: Unremarkable liver. Cholecystectomy. No biliary dilation. Pancreas: Fatty atrophy.  No acute abnormality. Spleen: Unremarkable. Adrenals/Urinary Tract: Stable adrenal glands and kidneys. No urinary calculi or hydronephrosis. Bladder is unremarkable. Stomach/Bowel: Sigmoid diverticulosis. Wall thickening and adjacent stranding about the sigmoid colon there is a tract of gas and fluid extending from the inflamed sigmoid colon to an adjacent loop of small bowel compatible with coloenteric fistula (series 3/image 50 [id number]). Normal caliber large and small bowel. Moderate colonic stool burden. The appendix is not visualized.Stomach is within normal limits. Vascular/Lymphatic: Aortic atherosclerosis. No enlarged abdominal or pelvic lymph nodes. Reproductive: Status post hysterectomy. No adnexal masses. Other: Free fluid in the pelvis along the inflamed sigmoid colon and the colo enteric fistula. Musculoskeletal: No acute fracture in the pelvis. LUMBAR SPINE: Segmentation: 5 lumbar type vertebrae. Alignment: No evidence of traumatic listhesis. Chronic retrolisthesis of L3 and L5. Vertebrae: Superior endplate compression fracture of L1 is  new since MRI 06/22/2023. There is 15 percent vertebral body height loss centrally. 2 mm of retropulsion of the superior endplate. Paraspinal and other soft tissues: See above. Disc levels: Multilevel spondylosis, disc space height loss, degenerative endplate changes greatest at L3-L4. Spinal canal narrowing is greatest at L4-L5 where it is moderate. IMPRESSION: 1. Acute sigmoid diverticulitis with coloenteric fistula. 2. Superior endplate compression fracture of L1 is new since MRI 06/22/2023. There is 15 percent vertebral body height loss centrally. 2 mm of retropulsion of the superior endplate. Electronically Signed   By: Rozell Cornet M.D.   On: 09/16/2023 17:59   CT L-SPINE NO CHARGE Result Date: 09/16/2023 CLINICAL DATA:  Abdominal and back pain after fall on Wednesday. Unable to eat for 2 weeks. Emesis after eating. Recent weight loss of 20 pounds. Back surgery in November of 2024 EXAM: CT ABDOMEN AND PELVIS WITH CONTRAST CT Lumbar Spine with contrast TECHNIQUE: Technique: Multiplanar CT images of the lumbar spine were reconstructed from contemporary CT of the Abdomen and Pelvis. Multidetector CT imaging of the abdomen and pelvis was performed using the standard protocol following bolus administration of intravenous contrast. RADIATION DOSE REDUCTION: This exam was performed according to the departmental dose-optimization program which includes automated exposure control, adjustment of the mA and/or kV according to patient size and/or use of iterative reconstruction technique. CONTRAST:  80mL OMNIPAQUE  IOHEXOL  300 MG/ML  SOLN COMPARISON:  CT abdomen pelvis 10/05/2021 and MRI lumbar spine 06/22/2023 FINDINGS: ABDOMEN/PELVIS: Lower chest: No acute abnormality. Hepatobiliary: Unremarkable liver. Cholecystectomy. No biliary dilation. Pancreas: Fatty atrophy.  No acute abnormality. Spleen: Unremarkable. Adrenals/Urinary Tract: Stable adrenal glands and kidneys. No urinary calculi or hydronephrosis. Bladder is  unremarkable. Stomach/Bowel: Sigmoid diverticulosis. Wall thickening and adjacent stranding about the sigmoid colon there is a tract of gas and fluid extending from the inflamed sigmoid colon to an adjacent loop of small bowel compatible with coloenteric fistula (series 3/image 50 [id number]). Normal caliber large and small bowel. Moderate colonic stool burden. The appendix is not visualized.Stomach is within normal limits. Vascular/Lymphatic: Aortic atherosclerosis. No enlarged abdominal or pelvic lymph nodes. Reproductive: Status post hysterectomy. No adnexal masses. Other: Free fluid in the pelvis along the inflamed sigmoid colon and the colo enteric fistula. Musculoskeletal: No acute fracture in the pelvis. LUMBAR SPINE: Segmentation: 5 lumbar type vertebrae. Alignment: No evidence  of traumatic listhesis. Chronic retrolisthesis of L3 and L5. Vertebrae: Superior endplate compression fracture of L1 is new since MRI 06/22/2023. There is 15 percent vertebral body height loss centrally. 2 mm of retropulsion of the superior endplate. Paraspinal and other soft tissues: See above. Disc levels: Multilevel spondylosis, disc space height loss, degenerative endplate changes greatest at L3-L4. Spinal canal narrowing is greatest at L4-L5 where it is moderate. IMPRESSION: 1. Acute sigmoid diverticulitis with coloenteric fistula. 2. Superior endplate compression fracture of L1 is new since MRI 06/22/2023. There is 15 percent vertebral body height loss centrally. 2 mm of retropulsion of the superior endplate. Electronically Signed   By: Rozell Cornet M.D.   On: 09/16/2023 17:59    Scheduled Meds:  carvedilol   25 mg Oral BID   docusate sodium   200 mg Oral QHS   doxazosin   8 mg Oral QHS   enoxaparin  (LOVENOX ) injection  30 mg Subcutaneous Q24H   famotidine   40 mg Oral Daily   levothyroxine   50 mcg Oral Q0600   Continuous Infusions:  sodium chloride  75 mL/hr at 09/17/23 0524   [START ON 09/18/2023] ciprofloxacin       metronidazole  500 mg (09/17/23 0546)    LOS: 1 day   Colin Dawley M.D on 09/17/2023 at 2:33 PM  Go to [email or web address] - for contact info  [facility] - Office  [phone]  If 7PM-7AM, please contact night-coverage [email or web address] 09/17/2023, 2:33 PM

## 2023-09-17 NOTE — Progress Notes (Signed)
   09/17/23 2140  Assess: MEWS Score  BP (!) 123/39  MAP (mmHg) (!) 64  Pulse Rate 76  ECG Heart Rate 76  Resp (!) 28  SpO2 98 %  Assess: MEWS Score  MEWS Temp 0  MEWS Systolic 0  MEWS Pulse 0  MEWS RR 2  MEWS LOC 0  MEWS Score 2  MEWS Score Color Yellow  Assess: if the MEWS score is Yellow or Red  Were vital signs accurate and taken at a resting state? Yes  Does the patient meet 2 or more of the SIRS criteria? Yes  Does the patient have a confirmed or suspected source of infection? Yes  MEWS guidelines implemented  Yes, yellow  Treat  MEWS Interventions Considered administering scheduled or prn medications/treatments as ordered  Take Vital Signs  Increase Vital Sign Frequency  Yellow: Q2hr x1, continue Q4hrs until patient remains green for 12hrs  Escalate  MEWS: Escalate Yellow: Discuss with charge nurse and consider notifying provider and/or RRT  Notify: Charge Nurse/RN  Name of Charge Nurse/RN Notified Jessica, RN  Assess: SIRS CRITERIA  SIRS Temperature  0  SIRS Respirations  1  SIRS Pulse 0  SIRS WBC 0  SIRS Score Sum  1

## 2023-09-17 NOTE — Progress Notes (Signed)
   09/17/23 0159  Assess: MEWS Score  Temp 98.4 F (36.9 C)  BP (!) 78/57  MAP (mmHg) (!) 64  Pulse Rate 90  Resp 20  SpO2 98 %  O2 Device Room Air  Assess: MEWS Score  MEWS Temp 0  MEWS Systolic 2  MEWS Pulse 0  MEWS RR 0  MEWS LOC 0  MEWS Score 2  MEWS Score Color Yellow  Assess: if the MEWS score is Yellow or Red  Were vital signs accurate and taken at a resting state? Yes  Does the patient meet 2 or more of the SIRS criteria? No  MEWS guidelines implemented  No, previously yellow, continue vital signs every 4 hours  Notify: Charge Nurse/RN  Name of Charge Nurse/RN Notified [place], RN  Provider Notification  Provider Name/Title Elyse Hand, DO  Date Provider Notified 09/16/23  Time Provider Notified 0210  Method of Notification Page  Notification Reason Change in status  Provider response See new orders  Date of Provider Response 09/17/23  Assess: SIRS CRITERIA  SIRS Temperature  0  SIRS Respirations  0  SIRS Pulse 0  SIRS WBC 0  SIRS Score Sum  0

## 2023-09-17 NOTE — Progress Notes (Signed)
 Patient is a high fall risk due to recent fall at home and weakness. Patient refuses bed alarm. Patient educated and verbalizes understanding.

## 2023-09-17 NOTE — Progress Notes (Signed)
 PHARMACY NOTE:  ANTIMICROBIAL RENAL DOSAGE ADJUSTMENT  Current antimicrobial regimen includes a mismatch between antimicrobial dosage and estimated renal function.  As per policy approved by the Pharmacy & Therapeutics and Medical Executive Committees, the antimicrobial dosage will be adjusted accordingly.  Current antimicrobial dosage:  Ciprofloxacin  400mg  IV q12H  Indication: Intra-abdominal infection  Renal Function:  Estimated Creatinine Clearance: 22.9 mL/min (A) (by C-G formula based on SCr of 2.05 mg/dL (H)).    Antimicrobial dosage has been changed to:  Ciprofloxacin  400mg  IV q24H  Additional comments:   Thank you for allowing pharmacy to be a part of this patient's care.   Will M. Alva Jewels, PharmD Clinical Pharmacist 09/17/2023 11:41 AM

## 2023-09-17 NOTE — Progress Notes (Signed)
   09/17/23 0008  Assess: MEWS Score  Temp 100 F (37.8 C)  BP (!) 82/56  MAP (mmHg) (!) 63  Pulse Rate 96  Resp (!) 22  SpO2 98 %  O2 Device Room Air  Assess: MEWS Score  MEWS Temp 0  MEWS Systolic 1  MEWS Pulse 0  MEWS RR 1  MEWS LOC 0  MEWS Score 2  MEWS Score Color Yellow  Assess: if the MEWS score is Yellow or Red  Were vital signs accurate and taken at a resting state? Yes  Does the patient meet 2 or more of the SIRS criteria? No  MEWS guidelines implemented  Yes, yellow  Treat  MEWS Interventions Considered administering scheduled or prn medications/treatments as ordered  Take Vital Signs  Increase Vital Sign Frequency  Yellow: Q2hr x1, continue Q4hrs until patient remains green for 12hrs  Escalate  MEWS: Escalate Yellow: Discuss with charge nurse and consider notifying provider and/or RRT  Notify: Charge Nurse/RN  Name of Charge Nurse/RN Notified Roanna Chew, RN  Provider Notification  Provider Name/Title Elyse Hand, DO  Date Provider Notified 09/17/23  Time Provider Notified 0015  Method of Notification Page  Notification Reason Change in status  Provider response No new orders  Assess: SIRS CRITERIA  SIRS Temperature  0  SIRS Respirations  1  SIRS Pulse 1  SIRS WBC 0  SIRS Score Sum  2

## 2023-09-17 NOTE — Plan of Care (Signed)
   Problem: Education: Goal: Knowledge of General Education information will improve Description: Including pain rating scale, medication(s)/side effects and non-pharmacologic comfort measures Outcome: Progressing   Problem: Clinical Measurements: Goal: Will remain free from infection Outcome: Progressing   Problem: Activity: Goal: Risk for activity intolerance will decrease Outcome: Progressing

## 2023-09-17 NOTE — Plan of Care (Signed)
  Problem: Activity: Goal: Risk for activity intolerance will decrease Outcome: Not Progressing   Problem: Nutrition: Goal: Adequate nutrition will be maintained Outcome: Not Progressing   Problem: Coping: Goal: Level of anxiety will decrease Outcome: Not Progressing

## 2023-09-17 NOTE — Consult Note (Signed)
 Reason for Consult: Sigmoid diverticulitis with coloenteric fistula Referring Physician: Dr. Adam Holm Feasel is an 78 y.o. female.  HPI: Patient is a 78 year old white female who presented to [facility] with a several day history of worsening left lower quadrant abdominal pain.  She also states she has lost 20 pounds over the last month due to a decreased appetite.  Her bowel movements have been somewhat regular.  She does suffer from back issues requiring epidural injections.  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  She denies any fever or chills.  She was admitted to the hospital for further evaluation and treatment.  She states she had a colonoscopy many years ago and was due for another colonoscopy next year.  She denies any diarrhea.  No blood in her stool has been noticed.  Past Medical History:  Diagnosis Date   HTN (hypertension)    Hypothyroidism     Past Surgical History:  Procedure Laterality Date   ABDOMINAL HYSTERECTOMY     BACK SURGERY     CHOLECYSTECTOMY     TONSILLECTOMY      Family History  Problem Relation Age of Onset   Colon cancer Neg Hx    Colon polyps Neg Hx     Social History:  reports that she has never smoked. She has never used smokeless tobacco. She reports that she does not drink alcohol and does not use drugs.  Allergies: No Known Allergies  Medications: I have reviewed the patient's current medications. Prior to Admission:  Medications Prior to Admission  Medication Sig Dispense Refill Last Dose/Taking   aspirin EC 81 MG tablet Take 81 mg by mouth daily. Swallow whole.      carvedilol  (COREG ) 25 MG tablet Take 25 mg by mouth 2 (two) times daily.      cloNIDine (CATAPRES) 0.1 MG tablet 1-2 times as needed      docusate sodium  (COLACE) 100 MG capsule Take 200 mg by mouth at bedtime.      doxazosin  (CARDURA ) 8 MG tablet  Take 8 mg by mouth at bedtime.      famotidine  (PEPCID ) 40 MG tablet Take 40 mg by mouth daily.      levothyroxine  (SYNTHROID ) 50 MCG tablet daily.      spironolactone-hydrochlorothiazide (ALDACTAZIDE) 25-25 MG tablet daily.      traMADol (ULTRAM) 50 MG tablet daily.      zolpidem  (AMBIEN ) 10 MG tablet Take 10 mg by mouth at bedtime as needed.       Results for orders placed or performed during the hospital encounter of 09/16/23 (from the past 48 hours)  CBC with Differential     Status: Abnormal   Collection Time: 09/16/23 12:45 PM  Result Value Ref Range   WBC 13.1 (H) 4.0 - 10.5 K/uL   RBC 3.37 (L) 3.87 - 5.11 MIL/uL   Hemoglobin 10.6 (L) 12.0 - 15.0 g/dL   HCT 40.9 (L) 81.1 - 91.4 %   MCV 94.7 80.0 - 100.0 fL   MCH 31.5 26.0 - 34.0 pg   MCHC 33.2 30.0 - 36.0 g/dL   RDW 78.2 95.6 - 21.3 %   Platelets 278 150 - 400 K/uL   nRBC 0.0 0.0 - 0.2 %   Neutrophils Relative % 83 %   Neutro Abs 10.8 (H) 1.7 - 7.7 K/uL   Lymphocytes Relative 6 %   Lymphs Abs 0.8  0.7 - 4.0 K/uL   Monocytes Relative 6 %   Monocytes Absolute 0.8 0.1 - 1.0 K/uL   Eosinophils Relative 2 %   Eosinophils Absolute 0.3 0.0 - 0.5 K/uL   Basophils Relative 0 %   Basophils Absolute 0.1 0.0 - 0.1 K/uL   Immature Granulocytes 3 %   Abs Immature Granulocytes 0.38 (H) 0.00 - 0.07 K/uL    Comment: Performed at [facility], [address] [place], [place] [address]  Comprehensive metabolic panel     Status: Abnormal   Collection Time: 09/16/23 12:45 PM  Result Value Ref Range   Sodium 129 (L) 135 - 145 mmol/L   Potassium 4.7 3.5 - 5.1 mmol/L   Chloride 99 98 - 111 mmol/L   CO2 20 (L) 22 - 32 mmol/L   Glucose, Bld 102 (H) 70 - 99 mg/dL    Comment: Glucose reference range applies only to samples taken after fasting for at least 8 hours.   BUN 38 (H) 8 - 23 mg/dL   Creatinine, Ser 6.04 (H) 0.44 - 1.00 mg/dL   Calcium 9.2 8.9 - 54.0 mg/dL   Total Protein 6.7 6.5 - 8.1 g/dL   Albumin 2.9 (L) 3.5 - 5.0 g/dL   AST 15  15 - 41 U/L   ALT 23 0 - 44 U/L   Alkaline Phosphatase 73 38 - 126 U/L   Total Bilirubin 0.3 0.0 - 1.2 mg/dL   GFR, Estimated 37 (L) >60 mL/min    Comment: (NOTE) Calculated using the CKD-EPI Creatinine Equation (2021)    Anion gap 10 5 - 15    Comment: Performed at [facility], [address], [place], [place] [address]  Lipase, blood     Status: None   Collection Time: 09/16/23 12:45 PM  Result Value Ref Range   Lipase 27 11 - 51 U/L    Comment: Performed at [facility], [address] [place], [place] [address]  Urinalysis, Routine w reflex microscopic -Urine, Clean Catch     Status: Abnormal   Collection Time: 09/16/23  3:08 PM  Result Value Ref Range   Color, Urine YELLOW YELLOW   APPearance HAZY (A) CLEAR   Specific Gravity, Urine 1.015 1.005 - 1.030   pH 5.0 5.0 - 8.0   Glucose, UA NEGATIVE NEGATIVE mg/dL   Hgb urine dipstick NEGATIVE NEGATIVE   Bilirubin Urine NEGATIVE NEGATIVE   Ketones, ur NEGATIVE NEGATIVE mg/dL   Protein, ur NEGATIVE NEGATIVE mg/dL   Nitrite NEGATIVE NEGATIVE   Leukocytes,Ua TRACE (A) NEGATIVE   RBC / HPF 0-5 0 - 5 RBC/hpf   WBC, UA 0-5 0 - 5 WBC/hpf   Bacteria, UA NONE SEEN NONE SEEN   Squamous Epithelial / HPF 0-5 0 - 5 /HPF   Hyaline Casts, UA PRESENT     Comment: Performed at [facility], [address], [place], [place] [address]  Basic metabolic panel     Status: Abnormal   Collection Time: 09/17/23  4:21 AM  Result Value Ref Range   Sodium 129 (L) 135 - 145 mmol/L   Potassium 5.1 3.5 - 5.1 mmol/L   Chloride 99 98 - 111 mmol/L   CO2 17 (L) 22 - 32 mmol/L   Glucose, Bld 83 70 - 99 mg/dL    Comment: Glucose reference range applies only to samples taken after fasting for at least 8 hours.   BUN 37 (H) 8 - 23 mg/dL   Creatinine, Ser 3.08 (H) 0.44 - 1.00 mg/dL  Calcium 8.5 (L) 8.9 - 10.3 mg/dL   GFR, Estimated 28 (L) >60 mL/min    Comment: (NOTE) Calculated using the CKD-EPI Creatinine Equation (2021)    Anion gap 13 5 - 15     Comment: Performed at [facility], [address], [place], [place] [address]  CBC     Status: Abnormal   Collection Time: 09/17/23  4:21 AM  Result Value Ref Range   WBC 23.0 (H) 4.0 - 10.5 K/uL   RBC 3.05 (L) 3.87 - 5.11 MIL/uL   Hemoglobin 9.5 (L) 12.0 - 15.0 g/dL   HCT 96.2 (L) 95.2 - 84.1 %   MCV 98.0 80.0 - 100.0 fL   MCH 31.1 26.0 - 34.0 pg   MCHC 31.8 30.0 - 36.0 g/dL   RDW 32.4 40.1 - 02.7 %   Platelets 246 150 - 400 K/uL   nRBC 0.0 0.0 - 0.2 %    Comment: Performed at [facility], [address], [place], [place] [address]    CT ABDOMEN PELVIS W CONTRAST Result Date: 09/16/2023 CLINICAL DATA:  Abdominal and back pain after fall on Wednesday. Unable to eat for 2 weeks. Emesis after eating. Recent weight loss of 20 pounds. Back surgery in November of 2024 EXAM: CT ABDOMEN AND PELVIS WITH CONTRAST CT Lumbar Spine with contrast TECHNIQUE: Technique: Multiplanar CT images of the lumbar spine were reconstructed from contemporary CT of the Abdomen and Pelvis. Multidetector CT imaging of the abdomen and pelvis was performed using the standard protocol following bolus administration of intravenous contrast. RADIATION DOSE REDUCTION: This exam was performed according to the departmental dose-optimization program which includes automated exposure control, adjustment of the mA and/or kV according to patient size and/or use of iterative reconstruction technique. CONTRAST:  80mL OMNIPAQUE  IOHEXOL  300 MG/ML  SOLN COMPARISON:  CT abdomen pelvis 10/05/2021 and MRI lumbar spine 06/22/2023 FINDINGS: ABDOMEN/PELVIS: Lower chest: No acute abnormality. Hepatobiliary: Unremarkable liver. Cholecystectomy. No biliary dilation. Pancreas: Fatty atrophy.  No acute abnormality. Spleen: Unremarkable. Adrenals/Urinary Tract: Stable adrenal glands and kidneys. No urinary calculi or hydronephrosis. Bladder is unremarkable. Stomach/Bowel: Sigmoid diverticulosis. Wall thickening and adjacent stranding about the sigmoid  colon there is a tract of gas and fluid extending from the inflamed sigmoid colon to an adjacent loop of small bowel compatible with coloenteric fistula (series 3/image 50 9-66). Normal caliber large and small bowel. Moderate colonic stool burden. The appendix is not visualized.Stomach is within normal limits. Vascular/Lymphatic: Aortic atherosclerosis. No enlarged abdominal or pelvic lymph nodes. Reproductive: Status post hysterectomy. No adnexal masses. Other: Free fluid in the pelvis along the inflamed sigmoid colon and the colo enteric fistula. Musculoskeletal: No acute fracture in the pelvis. LUMBAR SPINE: Segmentation: 5 lumbar type vertebrae. Alignment: No evidence of traumatic listhesis. Chronic retrolisthesis of L3 and L5. Vertebrae: Superior endplate compression fracture of L1 is new since MRI 06/22/2023. There is 15 percent vertebral body height loss centrally. 2 mm of retropulsion of the superior endplate. Paraspinal and other soft tissues: See above. Disc levels: Multilevel spondylosis, disc space height loss, degenerative endplate changes greatest at L3-L4. Spinal canal narrowing is greatest at L4-L5 where it is moderate. IMPRESSION: 1. Acute sigmoid diverticulitis with coloenteric fistula. 2. Superior endplate compression fracture of L1 is new since MRI 06/22/2023. There is 15 percent vertebral body height loss centrally. 2 mm of retropulsion of the superior endplate. Electronically Signed   By: Rozell Cornet M.D.   On: 09/16/2023 17:59   CT L-SPINE NO CHARGE Result Date: 09/16/2023 CLINICAL  DATA:  Abdominal and back pain after fall on Wednesday. Unable to eat for 2 weeks. Emesis after eating. Recent weight loss of 20 pounds. Back surgery in November of 2024 EXAM: CT ABDOMEN AND PELVIS WITH CONTRAST CT Lumbar Spine with contrast TECHNIQUE: Technique: Multiplanar CT images of the lumbar spine were reconstructed from contemporary CT of the Abdomen and Pelvis. Multidetector CT imaging of the abdomen  and pelvis was performed using the standard protocol following bolus administration of intravenous contrast. RADIATION DOSE REDUCTION: This exam was performed according to the departmental dose-optimization program which includes automated exposure control, adjustment of the mA and/or kV according to patient size and/or use of iterative reconstruction technique. CONTRAST:  80mL OMNIPAQUE  IOHEXOL  300 MG/ML  SOLN COMPARISON:  CT abdomen pelvis 10/05/2021 and MRI lumbar spine 06/22/2023 FINDINGS: ABDOMEN/PELVIS: Lower chest: No acute abnormality. Hepatobiliary: Unremarkable liver. Cholecystectomy. No biliary dilation. Pancreas: Fatty atrophy.  No acute abnormality. Spleen: Unremarkable. Adrenals/Urinary Tract: Stable adrenal glands and kidneys. No urinary calculi or hydronephrosis. Bladder is unremarkable. Stomach/Bowel: Sigmoid diverticulosis. Wall thickening and adjacent stranding about the sigmoid colon there is a tract of gas and fluid extending from the inflamed sigmoid colon to an adjacent loop of small bowel compatible with coloenteric fistula (series 3/image 50 9-66). Normal caliber large and small bowel. Moderate colonic stool burden. The appendix is not visualized.Stomach is within normal limits. Vascular/Lymphatic: Aortic atherosclerosis. No enlarged abdominal or pelvic lymph nodes. Reproductive: Status post hysterectomy. No adnexal masses. Other: Free fluid in the pelvis along the inflamed sigmoid colon and the colo enteric fistula. Musculoskeletal: No acute fracture in the pelvis. LUMBAR SPINE: Segmentation: 5 lumbar type vertebrae. Alignment: No evidence of traumatic listhesis. Chronic retrolisthesis of L3 and L5. Vertebrae: Superior endplate compression fracture of L1 is new since MRI 06/22/2023. There is 15 percent vertebral body height loss centrally. 2 mm of retropulsion of the superior endplate. Paraspinal and other soft tissues: See above. Disc levels: Multilevel spondylosis, disc space height loss,  degenerative endplate changes greatest at L3-L4. Spinal canal narrowing is greatest at L4-L5 where it is moderate. IMPRESSION: 1. Acute sigmoid diverticulitis with coloenteric fistula. 2. Superior endplate compression fracture of L1 is new since MRI 06/22/2023. There is 15 percent vertebral body height loss centrally. 2 mm of retropulsion of the superior endplate. Electronically Signed   By: Rozell Cornet M.D.   On: 09/16/2023 17:59    ROS:  Pertinent items are noted in HPI.  Blood pressure 103/60, pulse 73, temperature 98.4 F (36.9 C), temperature source Oral, resp. rate (!) 21, height 5' 3.5" (1.613 m), weight 79.9 kg, SpO2 97%. Physical Exam: Pleasant white female in no acute distress Head is normocephalic, atraumatic Lungs are clear to auscultation with equal breath sounds bilaterally Heart examination reveals a regular rate rhythm without S3, S4, murmurs Abdomen is soft with tenderness in the left lower quadrant to palpation.  No rigidity is noted.  CT scan images personally reviewed  Assessment/Plan: Impression: Sigmoid diverticulitis with coloenteric fistula.  No intra-abdominal abscess or free air is noted. Plan: No need for acute surgical intervention at the present time.  Would continue IV fluid.  Will monitor leukocytosis.  Continue IV antibiotics.  Will start clear liquid diet.  Will monitor over the next few days.  Hopefully, this will improve with IV antibiotics and will be able to be switched to p.o. antibiotics.  Will follow with you.  [name] 09/17/2023, 10:32 AM

## 2023-09-17 NOTE — Progress Notes (Signed)
 Pt unable to tolerate Lokelma . MD notified. EKG done and in chart, telemetry applied to pt. BP meds held tonight per MD order. Low DBP.

## 2023-09-18 DIAGNOSIS — K5732 Diverticulitis of large intestine without perforation or abscess without bleeding: Secondary | ICD-10-CM | POA: Diagnosis not present

## 2023-09-18 DIAGNOSIS — K5792 Diverticulitis of intestine, part unspecified, without perforation or abscess without bleeding: Secondary | ICD-10-CM | POA: Diagnosis not present

## 2023-09-18 DIAGNOSIS — I4891 Unspecified atrial fibrillation: Secondary | ICD-10-CM | POA: Diagnosis not present

## 2023-09-18 DIAGNOSIS — K632 Fistula of intestine: Secondary | ICD-10-CM | POA: Diagnosis not present

## 2023-09-18 LAB — POTASSIUM
Potassium: 4.8 mmol/L (ref 3.5–5.1)
Potassium: 4.8 mmol/L (ref 3.5–5.1)
Potassium: 5 mmol/L (ref 3.5–5.1)
Potassium: 5 mmol/L (ref 3.5–5.1)
Potassium: 5.3 mmol/L — ABNORMAL HIGH (ref 3.5–5.1)

## 2023-09-18 LAB — CBC
HCT: 31.4 % — ABNORMAL LOW (ref 36.0–46.0)
Hemoglobin: 10.4 g/dL — ABNORMAL LOW (ref 12.0–15.0)
MCH: 31.8 pg (ref 26.0–34.0)
MCHC: 33.1 g/dL (ref 30.0–36.0)
MCV: 96 fL (ref 80.0–100.0)
Platelets: 251 10*3/uL (ref 150–400)
RBC: 3.27 MIL/uL — ABNORMAL LOW (ref 3.87–5.11)
RDW: 14.7 % (ref 11.5–15.5)
WBC: 23.4 10*3/uL — ABNORMAL HIGH (ref 4.0–10.5)
nRBC: 0 % (ref 0.0–0.2)

## 2023-09-18 LAB — RENAL FUNCTION PANEL
Albumin: 2.7 g/dL — ABNORMAL LOW (ref 3.5–5.0)
Anion gap: 12 (ref 5–15)
BUN: 46 mg/dL — ABNORMAL HIGH (ref 8–23)
CO2: 14 mmol/L — ABNORMAL LOW (ref 22–32)
Calcium: 8.9 mg/dL (ref 8.9–10.3)
Chloride: 104 mmol/L (ref 98–111)
Creatinine, Ser: 1.82 mg/dL — ABNORMAL HIGH (ref 0.44–1.00)
GFR, Estimated: 28 mL/min — ABNORMAL LOW (ref >60)
Glucose, Bld: 88 mg/dL (ref 70–99)
Phosphorus: 3.6 mg/dL (ref 2.5–4.6)
Potassium: 5.2 mmol/L — ABNORMAL HIGH (ref 3.5–5.1)
Sodium: 130 mmol/L — ABNORMAL LOW (ref 135–145)

## 2023-09-18 LAB — MRSA NEXT GEN BY PCR, NASAL: MRSA by PCR Next Gen: NOT DETECTED

## 2023-09-18 LAB — MAGNESIUM: Magnesium: 1.7 mg/dL (ref 1.7–2.4)

## 2023-09-18 MED ORDER — CHLORHEXIDINE GLUCONATE CLOTH 2 % EX PADS
6.0000 | MEDICATED_PAD | Freq: Every day | CUTANEOUS | Status: DC
  Administered 2023-09-18 – 2023-09-28 (×9): 6 via TOPICAL

## 2023-09-18 MED ORDER — MAGNESIUM OXIDE -MG SUPPLEMENT 400 (240 MG) MG PO TABS: 400.0000 mg | ORAL_TABLET | Freq: Two times a day (BID) | ORAL | Status: DC

## 2023-09-18 MED ORDER — DILTIAZEM HCL-DEXTROSE 125-5 MG/125ML-% IV SOLN (PREMIX)
5.0000 mg/h | INTRAVENOUS | Status: DC
  Administered 2023-09-18: 5 mg/h via INTRAVENOUS
  Filled 2023-09-18 (×2): qty 125

## 2023-09-18 MED ORDER — DILTIAZEM HCL 25 MG/5ML IV SOLN
10.0000 mg | Freq: Once | INTRAVENOUS | Status: AC
  Administered 2023-09-18: 10 mg via INTRAVENOUS
  Filled 2023-09-18: qty 5

## 2023-09-18 MED ORDER — FAMOTIDINE IN NACL 20-0.9 MG/50ML-% IV SOLN
20.0000 mg | Freq: Two times a day (BID) | INTRAVENOUS | Status: DC
  Administered 2023-09-18 – 2023-09-24 (×12): 20 mg via INTRAVENOUS
  Filled 2023-09-18 (×12): qty 50

## 2023-09-18 MED ORDER — MAGNESIUM SULFATE 2 GM/50ML IV SOLN
2.0000 g | Freq: Once | INTRAVENOUS | Status: AC
Start: 2023-09-18 — End: 2023-09-18
  Administered 2023-09-18: 2 g via INTRAVENOUS
  Filled 2023-09-18: qty 50

## 2023-09-18 MED ORDER — SODIUM CHLORIDE 0.9 % IV SOLN: INTRAVENOUS | Status: AC

## 2023-09-18 MED ORDER — SODIUM ZIRCONIUM CYCLOSILICATE 5 G PO PACK
5.0000 g | PACK | Freq: Two times a day (BID) | ORAL | Status: AC
  Administered 2023-09-18 (×2): 5 g via ORAL
  Filled 2023-09-18 (×2): qty 1

## 2023-09-18 MED ORDER — SODIUM CHLORIDE 0.9 % IV SOLN
1.0000 g | Freq: Two times a day (BID) | INTRAVENOUS | Status: DC
  Administered 2023-09-18 – 2023-09-19 (×3): 1 g via INTRAVENOUS
  Filled 2023-09-18 (×3): qty 20

## 2023-09-18 MED ORDER — PROCHLORPERAZINE 25 MG RE SUPP
25.0000 mg | Freq: Once | RECTAL | Status: AC
  Administered 2023-09-18: 25 mg via RECTAL
  Filled 2023-09-18: qty 1

## 2023-09-18 NOTE — Progress Notes (Signed)
  Patient with episode of A-fib with RVR-- --she complains of fatigue, malaise and weakness -No fever  Or chills  --Patient denies chest pains or dizziness --- Nausea and vomiting persist requiring multiple antiemetics -No BM -Due to persistent A-fib with RVR with heart rate in the 150s to 170s EKG obtained and reviewed by me -Serial EKG consistent with A-fib with RVR -Patient received IV Cardizem  10 mg x 1 push--- remains tachycardic with heart rate 150s to 170s --Transfer patient to stepdown unit for initiation of IV Cardizem  continuous drip -Give magnesium  to get serum magnesium  close to 2 -Continue Lokelma  to get serum potassium below 5 -Discussed with patient granddaughter Magen at bedside  Total critical care time > 36 mins  Colin Dawley, MD

## 2023-09-18 NOTE — Progress Notes (Signed)
 Subjective: Patient very fatigued.  She does have mild lower abdominal pain.  She has been nauseated.  Objective: Vital signs in last 24 hours: Temp:  [98.1 F (36.7 C)-98.6 F (37 C)] 98.6 F (37 C) (04/20 0413) Pulse Rate:  [50-157] 156 (04/20 1106) Resp:  [13-31] 26 (04/20 1106) BP: (93-143)/(39-87) 129/69 (04/20 1103) SpO2:  [95 %-99 %] 95 % (04/20 1106) Last BM Date : 09/15/23  Intake/Output from previous day: 04/19 0701 - 04/20 0700 In: 741.3 [P.O.:360; I.V.:181.3; IV Piggyback:200] Out: -  Intake/Output this shift: No intake/output data recorded.  General appearance: alert, cooperative, and mild distress GI: Soft with discomfort to deep palpation in the suprapubic and left lower quadrant regions.  No rigidity is noted.  Difficult to assess for distention due to body habitus.  Lab Results:  Recent Labs    09/17/23 0421 09/18/23 0456  WBC 23.0* 23.4*  HGB 9.5* 10.4*  HCT 29.9* 31.4*  PLT 246 251   BMET Recent Labs    09/17/23 2039 09/18/23 0028 09/18/23 0456 09/18/23 0807  NA 128*  --  130*  --   K 5.5*   < > 5.2* 5.0  CL 102  --  104  --   CO2 15*  --  14*  --   GLUCOSE 101*  --  88  --   BUN 44*  --  46*  --   CREATININE 2.02*  --  1.82*  --   CALCIUM 8.7*  --  8.9  --    < > = values in this interval not displayed.   PT/INR No results for input(s): "LABPROT", "INR" in the last 72 hours.  Studies/Results: CT ABDOMEN PELVIS W CONTRAST Result Date: 09/16/2023 CLINICAL DATA:  Abdominal and back pain after fall on Wednesday. Unable to eat for 2 weeks. Emesis after eating. Recent weight loss of 20 pounds. Back surgery in November of 2024 EXAM: CT ABDOMEN AND PELVIS WITH CONTRAST CT Lumbar Spine with contrast TECHNIQUE: Technique: Multiplanar CT images of the lumbar spine were reconstructed from contemporary CT of the Abdomen and Pelvis. Multidetector CT imaging of the abdomen and pelvis was performed using the standard protocol following bolus  administration of intravenous contrast. RADIATION DOSE REDUCTION: This exam was performed according to the departmental dose-optimization program which includes automated exposure control, adjustment of the mA and/or kV according to patient size and/or use of iterative reconstruction technique. CONTRAST:  80mL OMNIPAQUE  IOHEXOL  300 MG/ML  SOLN COMPARISON:  CT abdomen pelvis 10/05/2021 and MRI lumbar spine 06/22/2023 FINDINGS: ABDOMEN/PELVIS: Lower chest: No acute abnormality. Hepatobiliary: Unremarkable liver. Cholecystectomy. No biliary dilation. Pancreas: Fatty atrophy.  No acute abnormality. Spleen: Unremarkable. Adrenals/Urinary Tract: Stable adrenal glands and kidneys. No urinary calculi or hydronephrosis. Bladder is unremarkable. Stomach/Bowel: Sigmoid diverticulosis. Wall thickening and adjacent stranding about the sigmoid colon there is a tract of gas and fluid extending from the inflamed sigmoid colon to an adjacent loop of small bowel compatible with coloenteric fistula (series 3/image 50 9-66). Normal caliber large and small bowel. Moderate colonic stool burden. The appendix is not visualized.Stomach is within normal limits. Vascular/Lymphatic: Aortic atherosclerosis. No enlarged abdominal or pelvic lymph nodes. Reproductive: Status post hysterectomy. No adnexal masses. Other: Free fluid in the pelvis along the inflamed sigmoid colon and the colo enteric fistula. Musculoskeletal: No acute fracture in the pelvis. LUMBAR SPINE: Segmentation: 5 lumbar type vertebrae. Alignment: No evidence of traumatic listhesis. Chronic retrolisthesis of L3 and L5. Vertebrae: Superior endplate compression fracture of L1 is new since  MRI 06/22/2023. There is 15 percent vertebral body height loss centrally. 2 mm of retropulsion of the superior endplate. Paraspinal and other soft tissues: See above. Disc levels: Multilevel spondylosis, disc space height loss, degenerative endplate changes greatest at L3-L4. Spinal canal  narrowing is greatest at L4-L5 where it is moderate. IMPRESSION: 1. Acute sigmoid diverticulitis with coloenteric fistula. 2. Superior endplate compression fracture of L1 is new since MRI 06/22/2023. There is 15 percent vertebral body height loss centrally. 2 mm of retropulsion of the superior endplate. Electronically Signed   By: Rozell Cornet M.D.   On: 09/16/2023 17:59   CT L-SPINE NO CHARGE Result Date: 09/16/2023 CLINICAL DATA:  Abdominal and back pain after fall on Wednesday. Unable to eat for 2 weeks. Emesis after eating. Recent weight loss of 20 pounds. Back surgery in November of 2024 EXAM: CT ABDOMEN AND PELVIS WITH CONTRAST CT Lumbar Spine with contrast TECHNIQUE: Technique: Multiplanar CT images of the lumbar spine were reconstructed from contemporary CT of the Abdomen and Pelvis. Multidetector CT imaging of the abdomen and pelvis was performed using the standard protocol following bolus administration of intravenous contrast. RADIATION DOSE REDUCTION: This exam was performed according to the departmental dose-optimization program which includes automated exposure control, adjustment of the mA and/or kV according to patient size and/or use of iterative reconstruction technique. CONTRAST:  80mL OMNIPAQUE  IOHEXOL  300 MG/ML  SOLN COMPARISON:  CT abdomen pelvis 10/05/2021 and MRI lumbar spine 06/22/2023 FINDINGS: ABDOMEN/PELVIS: Lower chest: No acute abnormality. Hepatobiliary: Unremarkable liver. Cholecystectomy. No biliary dilation. Pancreas: Fatty atrophy.  No acute abnormality. Spleen: Unremarkable. Adrenals/Urinary Tract: Stable adrenal glands and kidneys. No urinary calculi or hydronephrosis. Bladder is unremarkable. Stomach/Bowel: Sigmoid diverticulosis. Wall thickening and adjacent stranding about the sigmoid colon there is a tract of gas and fluid extending from the inflamed sigmoid colon to an adjacent loop of small bowel compatible with coloenteric fistula (series 3/image 50 9-66). Normal  caliber large and small bowel. Moderate colonic stool burden. The appendix is not visualized.Stomach is within normal limits. Vascular/Lymphatic: Aortic atherosclerosis. No enlarged abdominal or pelvic lymph nodes. Reproductive: Status post hysterectomy. No adnexal masses. Other: Free fluid in the pelvis along the inflamed sigmoid colon and the colo enteric fistula. Musculoskeletal: No acute fracture in the pelvis. LUMBAR SPINE: Segmentation: 5 lumbar type vertebrae. Alignment: No evidence of traumatic listhesis. Chronic retrolisthesis of L3 and L5. Vertebrae: Superior endplate compression fracture of L1 is new since MRI 06/22/2023. There is 15 percent vertebral body height loss centrally. 2 mm of retropulsion of the superior endplate. Paraspinal and other soft tissues: See above. Disc levels: Multilevel spondylosis, disc space height loss, degenerative endplate changes greatest at L3-L4. Spinal canal narrowing is greatest at L4-L5 where it is moderate. IMPRESSION: 1. Acute sigmoid diverticulitis with coloenteric fistula. 2. Superior endplate compression fracture of L1 is new since MRI 06/22/2023. There is 15 percent vertebral body height loss centrally. 2 mm of retropulsion of the superior endplate. Electronically Signed   By: Rozell Cornet M.D.   On: 09/16/2023 17:59    Anti-infectives: Anti-infectives (From admission, onward)    Start     Dose/Rate Route Frequency Ordered Stop   09/18/23 1030  meropenem  (MERREM ) 1 g in sodium chloride  0.9 % 100 mL IVPB        1 g 200 mL/hr over 30 Minutes Intravenous Every 12 hours 09/18/23 0934     09/18/23 1000  ciprofloxacin  (CIPRO ) IVPB 400 mg  Status:  Discontinued  400 mg 200 mL/hr over 60 Minutes Intravenous Every 24 hours 09/17/23 1140 09/18/23 0833   09/17/23 0600  ciprofloxacin  (CIPRO ) IVPB 400 mg  Status:  Discontinued        400 mg 200 mL/hr over 60 Minutes Intravenous Every 12 hours 09/16/23 1954 09/17/23 1140   09/17/23 0600  metroNIDAZOLE   (FLAGYL ) IVPB 500 mg  Status:  Discontinued        500 mg 100 mL/hr over 60 Minutes Intravenous Every 12 hours 09/16/23 1954 09/18/23 0833   09/16/23 1815  ciprofloxacin  (CIPRO ) IVPB 400 mg        400 mg 200 mL/hr over 60 Minutes Intravenous  Once 09/16/23 1805 09/16/23 2013   09/16/23 1815  metroNIDAZOLE  (FLAGYL ) IVPB 500 mg        500 mg 100 mL/hr over 60 Minutes Intravenous  Once 09/16/23 1805 09/16/23 2014       Assessment/Plan: Impression: Patient now in atrial fibrillation with rapid ventricular response.  She is being transferred to the stepdown unit.  Her nausea may be related to IV Flagyl  and thus she is being switched to Merrem .  She does not have rigidity or peritoneal signs requiring urgent surgical intervention.  Will monitor her white blood cell count.  She may need a repeat CT scan in the next 24 hours should she not improve.  LOS: 2 days    [name] 09/18/2023

## 2023-09-18 NOTE — Progress Notes (Signed)
 Notified by CCMD of elevated HR of 150-160. This nurse entered room to find patient supine in bed c/o pain 8/10 to back between shoulder blades. No c/o chest pain or SOB. EKG obtained revealing afib RVR. Dr. Quintella Buck made aware and at bedside. New ordered placed and patient to be transferred to stepdown. Granddaughter at bedside, patient agreeable to and verbalized understanding of plan of care.

## 2023-09-18 NOTE — Progress Notes (Signed)
   09/18/23 0912  Assess: MEWS Score  BP 136/87  MAP (mmHg) 94  Pulse Rate 84  ECG Heart Rate (!) 176  Resp (!) 29  Level of Consciousness Alert  SpO2 97 %  O2 Device Room Air  Assess: MEWS Score  MEWS Temp 0  MEWS Systolic 0  MEWS Pulse 3  MEWS RR 2  MEWS LOC 0  MEWS Score 5  MEWS Score Color Red  Assess: if the MEWS score is Yellow or Red  Were vital signs accurate and taken at a resting state? Yes  Does the patient meet 2 or more of the SIRS criteria? Yes  Does the patient have a confirmed or suspected source of infection? Yes  MEWS guidelines implemented  Yes, red  Treat  MEWS Interventions Considered administering scheduled or prn medications/treatments as ordered  Take Vital Signs  Increase Vital Sign Frequency  Red: Q1hr x2, continue Q4hrs until patient remains green for 12hrs  Escalate  MEWS: Escalate Red: Discuss with charge nurse and notify provider. Consider notifying RRT. If remains red for 2 hours consider need for higher level of care  Notify: Charge Nurse/RN  Name of Charge Nurse/RN Notified Oceanographer  Provider Notification  Provider Name/Title Dr. Quintella Buck  Date Provider Notified 09/18/23  Time Provider Notified 0920  Method of Notification Page Tilford Foley)  Notification Reason Change in status;New onset of dysrhythmia  Provider response At bedside  Date of Provider Response 09/18/23  Time of Provider Response 0925  Assess: SIRS CRITERIA  SIRS Temperature  0  SIRS Respirations  1  SIRS Pulse 1  SIRS WBC 1  SIRS Score Sum  3

## 2023-09-18 NOTE — Progress Notes (Signed)
 PROGRESS NOTE  Felicia Frank, is a 78 y.o. female, DOB - 02/05/1946, WUJ:[id number]  Admit date - 09/16/2023   Admitting Physician Malka Sea, DO  Outpatient Primary MD for the patient is [name], [name], MD  LOS - 2  Chief Complaint  Patient presents with   Fall      Brief Narrative:  78 y.o. 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 as well as superior endplate compression fracture of L1   -Assessment and Plan: 1)Acute Sigmoid Diverticulitis with coloenteric fistula--- -- discussed with general surgeon who advises nonoperative approach at this time -WBC 13.1 >>23.0>>> 23.4 -Given persistent abdominal pain and persistent emesis and persistent leukocytosis we will stop Cipro  and Flagyl  on 09/18/23  -Start Meropenem  on 09/18/23 -Low threshold for repeat abdominal imaging -Flagyl  may have been contributing to her persistent nausea and vomiting -c/n  As needed Dilaudid  and oxycodone  for pain control -c/n  As needed Zofran  for nausea and vomiting  2)AKI----acute kidney injury on CKD stage - 3A - Creatinine on admission= 1.46, baseline creatinine = 1.3 (on 06/07/23) per Care Everywhere    , -Creatinine peaked at 2.05 this admission -Creatinine trended down with hydration Renally adjust medications, avoid nephrotoxic agents / dehydration  / hypotension  3)L1 Compression Fx---Superior endplate compression fracture of L1 is new since MRI 06/22/2023. There is 15 percent vertebral body height loss centrally. 2 mm of retropulsion of the superior endplate. - As needed pain medication - TLSO brace advised - Outpatient follow-up with spine specialist  4)HypoNatremia--due to dehydration, emesis with GI losses and poor oral intake - c/n IV fluids as ordered  5)HTN--stable, continue Coreg  and  Cardura   6)Hypothyroidism--- continue levothyroxine   7)Hyperkalemia--in the setting of worsening renal function/AKI on CKD   -Potassium improving with Lokelma     Status is: Inpatient  Disposition: The patient is from: Home              Anticipated d/c is to: Home              Anticipated d/c date is: > 3 days              Patient currently is not medically stable to d/c. Barriers: Not Clinically Stable-   Code Status :  -  Code Status: Full Code   Family Communication:    NA (patient is alert, awake and coherent)   DVT Prophylaxis  :   - SCDs   enoxaparin  (LOVENOX ) injection 30 mg Start: 09/17/23 2200   Lab Results  Component Value Date   PLT 251 09/18/2023    Inpatient Medications  Scheduled Meds:  carvedilol   25 mg Oral BID   docusate sodium   200 mg Oral QHS   doxazosin   8 mg Oral QHS   enoxaparin  (LOVENOX ) injection  30 mg Subcutaneous Q24H   levothyroxine   50 mcg Oral Q0600   prochlorperazine   25 mg Rectal Once   sodium zirconium cyclosilicate   5 g Oral Q8H   sodium zirconium cyclosilicate   5 g Oral BID   Continuous Infusions:  sodium chloride      famotidine  (PEPCID ) IV     magnesium  sulfate bolus IVPB     PRN Meds:.HYDROmorphone  (DILAUDID ) injection, ondansetron  (ZOFRAN ) IV, oxyCODONE , zolpidem    Anti-infectives (From admission, onward)    Start     Dose/Rate Route Frequency Ordered  Stop   09/18/23 1000  ciprofloxacin  (CIPRO ) IVPB 400 mg  Status:  Discontinued        400 mg 200 mL/hr over 60 Minutes Intravenous Every 24 hours 09/17/23 1140 09/18/23 0833   09/17/23 0600  ciprofloxacin  (CIPRO ) IVPB 400 mg  Status:  Discontinued        400 mg 200 mL/hr over 60 Minutes Intravenous Every 12 hours 09/16/23 1954 09/17/23 1140   09/17/23 0600  metroNIDAZOLE  (FLAGYL ) IVPB 500 mg  Status:  Discontinued        500 mg 100 mL/hr over 60 Minutes Intravenous Every 12 hours 09/16/23 1954 09/18/23 0833   09/16/23 1815  ciprofloxacin  (CIPRO ) IVPB 400 mg        400  mg 200 mL/hr over 60 Minutes Intravenous  Once 09/16/23 1805 09/16/23 2013   09/16/23 1815  metroNIDAZOLE  (FLAGYL ) IVPB 500 mg        500 mg 100 mL/hr over 60 Minutes Intravenous  Once 09/16/23 1805 09/16/23 2014        Subjective: Felicia Frank today has no fevers,   No chest pain,   - - persistent emesis and abdominal pain - Persistent leukocytosis with WBC of 23K  Objective: Vitals:   09/18/23 0413 09/18/23 0500 09/18/23 0600 09/18/23 0700  BP:  (!) 132/47 118/69 (!) 143/69  Pulse:  75 72 96  Resp:  (!) 23 16 18   Temp: 98.6 F (37 C)     TempSrc: Oral     SpO2:  98% 97% 98%  Weight:      Height:        Intake/Output Summary (Last 24 hours) at 09/18/2023 0847 Last data filed at 09/18/2023 0429 Gross per 24 hour  Intake 741.3 ml  Output --  Net 741.3 ml   Filed Weights   09/16/23 1117 09/16/23 1946  Weight: 78 kg 79.9 kg   Physical Exam  Gen:- Awake Alert,  in no apparent distress  HEENT:- [place].AT, No sclera icterus Neck-Supple Neck,No JVD,.  Lungs-  CTAB , fair symmetrical air movement CV- S1, S2 normal, regular  Abd-  +ve B.Sounds, Abd Soft, left lower quadrant tenderness, some voluntary guarding, no significant rebound , No CVA Tenderness Extremity/Skin:- No  edema, pedal pulses present  Psych-affect is appropriate, oriented x3 Neuro-no new focal deficits, no tremors  Data Reviewed: I have personally reviewed following labs and imaging studies  CBC: Recent Labs  Lab 09/16/23 1245 09/17/23 0421 09/18/23 0456  WBC 13.1* 23.0* 23.4*  NEUTROABS 10.8*  --   --   HGB 10.6* 9.5* 10.4*  HCT 31.9* 29.9* 31.4*  MCV 94.7 98.0 96.0  PLT 278 246 251   Basic Metabolic Panel: Recent Labs  Lab 09/16/23 1245 09/17/23 0421 09/17/23 1046 09/17/23 2039 09/18/23 0028 09/18/23 0456 09/18/23 0500 09/18/23 0807  NA 129* 129* 128* 128*  --  130*  --   --   K 4.7 5.1 5.7* 5.5* 5.3* 5.2*  --  5.0  CL 99 99 101 102  --  104  --   --   CO2 20* 17* 19* 15*  --   14*  --   --   GLUCOSE 102* 83 108* 101*  --  88  --   --   BUN 38* 37* 40* 44*  --  46*  --   --   CREATININE 1.46* 1.82* 2.05* 2.02*  --  1.82*  --   --   CALCIUM 9.2 8.5* 8.6* 8.7*  --  8.9  --   --  MG  --   --   --   --   --   --  1.7  --   PHOS  --   --   --   --   --  3.6  --   --    GFR: Estimated Creatinine Clearance: 25.8 mL/min (A) (by C-G formula based on SCr of 1.82 mg/dL (H)). Liver Function Tests: Recent Labs  Lab 09/16/23 1245 09/18/23 0456  AST 15  --   ALT 23  --   ALKPHOS 73  --   BILITOT 0.3  --   PROT 6.7  --   ALBUMIN  2.9* 2.7*   Radiology Studies: CT ABDOMEN PELVIS W CONTRAST Result Date: 09/16/2023 CLINICAL DATA:  Abdominal and back pain after fall on Wednesday. Unable to eat for 2 weeks. Emesis after eating. Recent weight loss of 20 pounds. Back surgery in November of 2024 EXAM: CT ABDOMEN AND PELVIS WITH CONTRAST CT Lumbar Spine with contrast TECHNIQUE: Technique: Multiplanar CT images of the lumbar spine were reconstructed from contemporary CT of the Abdomen and Pelvis. Multidetector CT imaging of the abdomen and pelvis was performed using the standard protocol following bolus administration of intravenous contrast. RADIATION DOSE REDUCTION: This exam was performed according to the departmental dose-optimization program which includes automated exposure control, adjustment of the mA and/or kV according to patient size and/or use of iterative reconstruction technique. CONTRAST:  80mL OMNIPAQUE  IOHEXOL  300 MG/ML  SOLN COMPARISON:  CT abdomen pelvis 10/05/2021 and MRI lumbar spine 06/22/2023 FINDINGS: ABDOMEN/PELVIS: Lower chest: No acute abnormality. Hepatobiliary: Unremarkable liver. Cholecystectomy. No biliary dilation. Pancreas: Fatty atrophy.  No acute abnormality. Spleen: Unremarkable. Adrenals/Urinary Tract: Stable adrenal glands and kidneys. No urinary calculi or hydronephrosis. Bladder is unremarkable. Stomach/Bowel: Sigmoid diverticulosis. Wall thickening  and adjacent stranding about the sigmoid colon there is a tract of gas and fluid extending from the inflamed sigmoid colon to an adjacent loop of small bowel compatible with coloenteric fistula (series 3/image 50 9-66). Normal caliber large and small bowel. Moderate colonic stool burden. The appendix is not visualized.Stomach is within normal limits. Vascular/Lymphatic: Aortic atherosclerosis. No enlarged abdominal or pelvic lymph nodes. Reproductive: Status post hysterectomy. No adnexal masses. Other: Free fluid in the pelvis along the inflamed sigmoid colon and the colo enteric fistula. Musculoskeletal: No acute fracture in the pelvis. LUMBAR SPINE: Segmentation: 5 lumbar type vertebrae. Alignment: No evidence of traumatic listhesis. Chronic retrolisthesis of L3 and L5. Vertebrae: Superior endplate compression fracture of L1 is new since MRI 06/22/2023. There is 15 percent vertebral body height loss centrally. 2 mm of retropulsion of the superior endplate. Paraspinal and other soft tissues: See above. Disc levels: Multilevel spondylosis, disc space height loss, degenerative endplate changes greatest at L3-L4. Spinal canal narrowing is greatest at L4-L5 where it is moderate. IMPRESSION: 1. Acute sigmoid diverticulitis with coloenteric fistula. 2. Superior endplate compression fracture of L1 is new since MRI 06/22/2023. There is 15 percent vertebral body height loss centrally. 2 mm of retropulsion of the superior endplate. Electronically Signed   By: Rozell Cornet M.D.   On: 09/16/2023 17:59   CT L-SPINE NO CHARGE Result Date: 09/16/2023 CLINICAL DATA:  Abdominal and back pain after fall on Wednesday. Unable to eat for 2 weeks. Emesis after eating. Recent weight loss of 20 pounds. Back surgery in November of 2024 EXAM: CT ABDOMEN AND PELVIS WITH CONTRAST CT Lumbar Spine with contrast TECHNIQUE: Technique: Multiplanar CT images of the lumbar spine were reconstructed from contemporary CT of the Abdomen  and  Pelvis. Multidetector CT imaging of the abdomen and pelvis was performed using the standard protocol following bolus administration of intravenous contrast. RADIATION DOSE REDUCTION: This exam was performed according to the departmental dose-optimization program which includes automated exposure control, adjustment of the mA and/or kV according to patient size and/or use of iterative reconstruction technique. CONTRAST:  80mL OMNIPAQUE  IOHEXOL  300 MG/ML  SOLN COMPARISON:  CT abdomen pelvis 10/05/2021 and MRI lumbar spine 06/22/2023 FINDINGS: ABDOMEN/PELVIS: Lower chest: No acute abnormality. Hepatobiliary: Unremarkable liver. Cholecystectomy. No biliary dilation. Pancreas: Fatty atrophy.  No acute abnormality. Spleen: Unremarkable. Adrenals/Urinary Tract: Stable adrenal glands and kidneys. No urinary calculi or hydronephrosis. Bladder is unremarkable. Stomach/Bowel: Sigmoid diverticulosis. Wall thickening and adjacent stranding about the sigmoid colon there is a tract of gas and fluid extending from the inflamed sigmoid colon to an adjacent loop of small bowel compatible with coloenteric fistula (series 3/image 50 [id number]). Normal caliber large and small bowel. Moderate colonic stool burden. The appendix is not visualized.Stomach is within normal limits. Vascular/Lymphatic: Aortic atherosclerosis. No enlarged abdominal or pelvic lymph nodes. Reproductive: Status post hysterectomy. No adnexal masses. Other: Free fluid in the pelvis along the inflamed sigmoid colon and the colo enteric fistula. Musculoskeletal: No acute fracture in the pelvis. LUMBAR SPINE: Segmentation: 5 lumbar type vertebrae. Alignment: No evidence of traumatic listhesis. Chronic retrolisthesis of L3 and L5. Vertebrae: Superior endplate compression fracture of L1 is new since MRI 06/22/2023. There is 15 percent vertebral body height loss centrally. 2 mm of retropulsion of the superior endplate. Paraspinal and other soft tissues: See above. Disc levels:  Multilevel spondylosis, disc space height loss, degenerative endplate changes greatest at L3-L4. Spinal canal narrowing is greatest at L4-L5 where it is moderate. IMPRESSION: 1. Acute sigmoid diverticulitis with coloenteric fistula. 2. Superior endplate compression fracture of L1 is new since MRI 06/22/2023. There is 15 percent vertebral body height loss centrally. 2 mm of retropulsion of the superior endplate. Electronically Signed   By: Rozell Cornet M.D.   On: 09/16/2023 17:59    Scheduled Meds:  carvedilol   25 mg Oral BID   docusate sodium   200 mg Oral QHS   doxazosin   8 mg Oral QHS   enoxaparin  (LOVENOX ) injection  30 mg Subcutaneous Q24H   levothyroxine   50 mcg Oral Q0600   prochlorperazine   25 mg Rectal Once   sodium zirconium cyclosilicate   5 g Oral Q8H   sodium zirconium cyclosilicate   5 g Oral BID   Continuous Infusions:  sodium chloride      famotidine  (PEPCID ) IV     magnesium  sulfate bolus IVPB      LOS: 2 days   Colin Dawley M.D on 09/18/2023 at 8:47 AM  Go to [email or web address] - for contact info  [facility] - Office  [phone]  If 7PM-7AM, please contact night-coverage [email or web address] 09/18/2023, 8:47 AM

## 2023-09-19 DIAGNOSIS — K5792 Diverticulitis of intestine, part unspecified, without perforation or abscess without bleeding: Secondary | ICD-10-CM | POA: Diagnosis not present

## 2023-09-19 DIAGNOSIS — K632 Fistula of intestine: Secondary | ICD-10-CM | POA: Diagnosis not present

## 2023-09-19 DIAGNOSIS — K5732 Diverticulitis of large intestine without perforation or abscess without bleeding: Secondary | ICD-10-CM | POA: Diagnosis not present

## 2023-09-19 LAB — BASIC METABOLIC PANEL WITH GFR
Anion gap: 10 (ref 5–15)
BUN: 42 mg/dL — ABNORMAL HIGH (ref 8–23)
CO2: 16 mmol/L — ABNORMAL LOW (ref 22–32)
Calcium: 8.5 mg/dL — ABNORMAL LOW (ref 8.9–10.3)
Chloride: 107 mmol/L (ref 98–111)
Creatinine, Ser: 1.47 mg/dL — ABNORMAL HIGH (ref 0.44–1.00)
GFR, Estimated: 36 mL/min — ABNORMAL LOW (ref >60)
Glucose, Bld: 83 mg/dL (ref 70–99)
Potassium: 4.8 mmol/L (ref 3.5–5.1)
Sodium: 133 mmol/L — ABNORMAL LOW (ref 135–145)

## 2023-09-19 LAB — CBC
HCT: 27.6 % — ABNORMAL LOW (ref 36.0–46.0)
Hemoglobin: 8.9 g/dL — ABNORMAL LOW (ref 12.0–15.0)
MCH: 31 pg (ref 26.0–34.0)
MCHC: 32.2 g/dL (ref 30.0–36.0)
MCV: 96.2 fL (ref 80.0–100.0)
Platelets: 245 10*3/uL (ref 150–400)
RBC: 2.87 MIL/uL — ABNORMAL LOW (ref 3.87–5.11)
RDW: 14.8 % (ref 11.5–15.5)
WBC: 21.5 10*3/uL — ABNORMAL HIGH (ref 4.0–10.5)
nRBC: 0 % (ref 0.0–0.2)

## 2023-09-19 LAB — POTASSIUM: Potassium: 4.8 mmol/L (ref 3.5–5.1)

## 2023-09-19 MED ORDER — PIPERACILLIN-TAZOBACTAM 3.375 G IVPB
3.3750 g | Freq: Three times a day (TID) | INTRAVENOUS | Status: DC
  Administered 2023-09-19 – 2023-09-25 (×16): 3.375 g via INTRAVENOUS
  Filled 2023-09-19 (×17): qty 50

## 2023-09-19 MED ORDER — LABETALOL HCL 5 MG/ML IV SOLN
10.0000 mg | INTRAVENOUS | Status: DC | PRN
  Administered 2023-09-23 – 2023-09-24 (×2): 10 mg via INTRAVENOUS
  Filled 2023-09-19 (×2): qty 4

## 2023-09-19 MED ORDER — SODIUM CHLORIDE 0.9 % IV SOLN: INTRAVENOUS | Status: AC | PRN

## 2023-09-19 NOTE — Progress Notes (Signed)
  Subjective: Patient sitting in chair.  Feels a little better but still fatigued.  No significant worsening of abdominal pain.  Still with some belching.  Objective: Vital signs in last 24 hours: Temp:  [97.7 F (36.5 C)-98.3 F (36.8 C)] 98 F (36.7 C) (04/21 0759) Pulse Rate:  [50-157] 75 (04/21 0700) Resp:  [13-31] 24 (04/21 0700) BP: (96-178)/(39-87) 178/75 (04/21 0700) SpO2:  [91 %-100 %] 91 % (04/21 0700) Weight:  [84.2 kg] 84.2 kg (04/20 1125) Last BM Date : 09/15/23  Intake/Output from previous day: 04/20 0701 - 04/21 0700 In: 1697.1 [P.O.:240; I.V.:1120.5; IV Piggyback:336.7] Out: 900 [Urine:900] Intake/Output this shift: No intake/output data recorded.  General appearance: alert, cooperative, and fatigued GI: Soft with some tenderness to deep palpation in left lower quadrant.  No rigidity noted.  Minimal bowel sounds appreciated.  No peritoneal signs noted.  Lab Results:  Recent Labs    09/18/23 0456 09/19/23 0458  WBC 23.4* 21.5*  HGB 10.4* 8.9*  HCT 31.4* 27.6*  PLT 251 245   BMET Recent Labs    09/18/23 0456 09/18/23 0807 09/19/23 0123 09/19/23 0458  NA 130*  --   --  133*  K 5.2*   < > 4.8 4.8  CL 104  --   --  107  CO2 14*  --   --  16*  GLUCOSE 88  --   --  83  BUN 46*  --   --  42*  CREATININE 1.82*  --   --  1.47*  CALCIUM 8.9  --   --  8.5*   < > = values in this interval not displayed.   PT/INR No results for input(s): "LABPROT", "INR" in the last 72 hours.  Studies/Results: No results found.  Anti-infectives: Anti-infectives (From admission, onward)    Start     Dose/Rate Route Frequency Ordered Stop   09/18/23 1030  meropenem  (MERREM ) 1 g in sodium chloride  0.9 % 100 mL IVPB        1 g 200 mL/hr over 30 Minutes Intravenous Every 12 hours 09/18/23 0934     09/18/23 1000  ciprofloxacin  (CIPRO ) IVPB 400 mg  Status:  Discontinued        400 mg 200 mL/hr over 60 Minutes Intravenous Every 24 hours 09/17/23 1140 09/18/23 0833    09/17/23 0600  ciprofloxacin  (CIPRO ) IVPB 400 mg  Status:  Discontinued        400 mg 200 mL/hr over 60 Minutes Intravenous Every 12 hours 09/16/23 1954 09/17/23 1140   09/17/23 0600  metroNIDAZOLE  (FLAGYL ) IVPB 500 mg  Status:  Discontinued        500 mg 100 mL/hr over 60 Minutes Intravenous Every 12 hours 09/16/23 1954 09/18/23 0833   09/16/23 1815  ciprofloxacin  (CIPRO ) IVPB 400 mg        400 mg 200 mL/hr over 60 Minutes Intravenous  Once 09/16/23 1805 09/16/23 2013   09/16/23 1815  metroNIDAZOLE  (FLAGYL ) IVPB 500 mg        500 mg 100 mL/hr over 60 Minutes Intravenous  Once 09/16/23 1805 09/16/23 2014       Assessment/Plan: Impression: Sigmoid diverticulitis with coloenteric fistula.  Still with leukocytosis.  She has been switched to Merrem .  Will discuss with Dr. Quintella Buck timing/need for follow-up CT scan.  Patient is now in normal sinus rhythm.  LOS: 3 days    [name] 09/19/2023

## 2023-09-19 NOTE — Progress Notes (Signed)
 PROGRESS NOTE  Felicia Frank, is a 78 y.o. female, DOB - Oct 16, 1945, ZOX:[id number]  Admit date - 09/16/2023   Admitting Physician Malka Sea, DO  Outpatient Primary MD for the patient is [name], [name], MD  LOS - 3  Chief Complaint  Patient presents with   Fall      Brief Narrative:  78 y.o. 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 as well as superior endplate compression fracture of L1   -Assessment and Plan: 1)Acute Sigmoid Diverticulitis with coloenteric fistula--- -- discussed with general surgeon who advises nonoperative approach at this time -WBC 13.1 >>23.0>>> 23.4>>21.5 -Given persistent abdominal pain and persistent emesis and persistent leukocytosis we stopped Cipro  and Flagyl  on 09/18/23  -c/n  Meropenem  (started on 09/18/23) -Low threshold for repeat abdominal imaging-possible repeat CT abdomen with oral contrast on 09/20/2023 -c/n  As needed Dilaudid  and oxycodone  for pain control -c/n  As needed Zofran  for nausea and vomiting  2)AKI----acute kidney injury on CKD stage - 3A - Creatinine on admission= 1.46, baseline creatinine = 1.3 (on 06/07/23) per Care Everywhere    , -Creatinine peaked at 2.05 this admission -Creatinine trended down with hydration--currently 1.47 Renally adjust medications, avoid nephrotoxic agents / dehydration  / hypotension  3)L1 Compression Fx---Superior endplate compression fracture of L1 is new since MRI 06/22/2023. There is 15 percent vertebral body height loss centrally. 2 mm of retropulsion of the superior endplate. - As needed pain medication - TLSO brace advised - Outpatient follow-up with spine specialist  4)HypoNatremia--due to dehydration, emesis with GI losses and poor oral intake -Sodium improving with hydration - c/n IV fluids as  ordered  5)HTN--stable, continue Coreg  and Cardura   6)Hypothyroidism--- continue levothyroxine   7)Hyperkalemia--in the setting of worsening renal function/AKI on CKD   -Potassium normalized with Lokelma    8) chronic anemia--Hgb trended down with hemodilution - Baseline hemoglobin usually around 10 - No bleeding concerns   Status is: Inpatient  Disposition: The patient is from: Home              Anticipated d/c is to: Home              Anticipated d/c date is: > 3 days              Patient currently is not medically stable to d/c. Barriers: Not Clinically Stable-   Code Status :  -  Code Status: Full Code   Family Communication:    NA (patient is alert, awake and coherent)   DVT Prophylaxis  :   - SCDs   enoxaparin  (LOVENOX ) injection 30 mg Start: 09/17/23 2200   Lab Results  Component Value Date   PLT 245 09/19/2023    Inpatient Medications  Scheduled Meds:  carvedilol   25 mg Oral BID   Chlorhexidine  Gluconate Cloth  6 each Topical Q0600   docusate sodium   200 mg Oral QHS   doxazosin   8 mg Oral QHS   enoxaparin  (LOVENOX ) injection  30 mg Subcutaneous Q24H   levothyroxine   50 mcg Oral Q0600   Continuous Infusions:  famotidine  (PEPCID ) IV 20 mg (09/19/23 0822)   meropenem  (MERREM ) IV 1 g (09/19/23 0937)   PRN Meds:.HYDROmorphone  (DILAUDID ) injection, labetalol , ondansetron  (ZOFRAN ) IV, oxyCODONE , zolpidem    Anti-infectives (From admission, onward)    Start     Dose/Rate Route Frequency  Ordered Stop   09/18/23 1030  meropenem  (MERREM ) 1 g in sodium chloride  0.9 % 100 mL IVPB        1 g 200 mL/hr over 30 Minutes Intravenous Every 12 hours 09/18/23 0934     09/18/23 1000  ciprofloxacin  (CIPRO ) IVPB 400 mg  Status:  Discontinued        400 mg 200 mL/hr over 60 Minutes Intravenous Every 24 hours 09/17/23 1140 09/18/23 0833   09/17/23 0600  ciprofloxacin  (CIPRO ) IVPB 400 mg  Status:  Discontinued        400 mg 200 mL/hr over 60 Minutes Intravenous Every 12 hours  09/16/23 1954 09/17/23 1140   09/17/23 0600  metroNIDAZOLE  (FLAGYL ) IVPB 500 mg  Status:  Discontinued        500 mg 100 mL/hr over 60 Minutes Intravenous Every 12 hours 09/16/23 1954 09/18/23 0833   09/16/23 1815  ciprofloxacin  (CIPRO ) IVPB 400 mg        400 mg 200 mL/hr over 60 Minutes Intravenous  Once 09/16/23 1805 09/16/23 2013   09/16/23 1815  metroNIDAZOLE  (FLAGYL ) IVPB 500 mg        500 mg 100 mL/hr over 60 Minutes Intravenous  Once 09/16/23 1805 09/16/23 2014        Subjective: Felicia Frank today has no fevers,   No chest pain,   - - No further tachycardia, back in sinus rhythm - Her cousin Abe Abed is at bedside - Nausea persist, no further emesis today - No BM  Objective: Vitals:   09/19/23 0700 09/19/23 0759 09/19/23 0800 09/19/23 0900  BP: (!) 178/75  (!) 166/66 (!) 175/66  Pulse: 75  (!) 58 63  Resp: (!) 24  15 (!) 24  Temp:  98 F (36.7 C)    TempSrc:  Oral    SpO2: 91%  98% 98%  Weight:      Height:        Intake/Output Summary (Last 24 hours) at 09/19/2023 0950 Last data filed at 09/19/2023 0600 Gross per 24 hour  Intake 1697.11 ml  Output 900 ml  Net 797.11 ml   Filed Weights   09/16/23 1117 09/16/23 1946 09/18/23 1125  Weight: 78 kg 79.9 kg 84.2 kg   Physical Exam  Gen:- Awake Alert,  in no apparent distress  HEENT:- [place].AT, No sclera icterus Neck-Supple Neck,No JVD,.  Lungs-  CTAB , fair symmetrical air movement CV- S1, S2 normal, regular  Abd-  +ve B.Sounds, Abd Soft, left lower quadrant tenderness, some voluntary guarding, no   rebound , No CVA Tenderness Extremity/Skin:- No  edema, pedal pulses present  Psych-affect is appropriate, oriented x3 Neuro-no new focal deficits, no tremors  Data Reviewed: I have personally reviewed following labs and imaging studies  CBC: Recent Labs  Lab 09/16/23 1245 09/17/23 0421 09/18/23 0456 09/19/23 0458  WBC 13.1* 23.0* 23.4* 21.5*  NEUTROABS 10.8*  --   --   --   HGB 10.6* 9.5* 10.4* 8.9*   HCT 31.9* 29.9* 31.4* 27.6*  MCV 94.7 98.0 96.0 96.2  PLT 278 246 251 245   Basic Metabolic Panel: Recent Labs  Lab 09/17/23 0421 09/17/23 1046 09/17/23 2039 09/18/23 0028 09/18/23 0456 09/18/23 0500 09/18/23 0807 09/18/23 1235 09/18/23 1655 09/18/23 2042 09/19/23 0123 09/19/23 0458  NA 129* 128* 128*  --  130*  --   --   --   --   --   --  133*  K 5.1 5.7* 5.5*   < > 5.2*  --    < >  5.0 4.8 4.8 4.8 4.8  CL 99 101 102  --  104  --   --   --   --   --   --  107  CO2 17* 19* 15*  --  14*  --   --   --   --   --   --  16*  GLUCOSE 83 108* 101*  --  88  --   --   --   --   --   --  83  BUN 37* 40* 44*  --  46*  --   --   --   --   --   --  42*  CREATININE 1.82* 2.05* 2.02*  --  1.82*  --   --   --   --   --   --  1.47*  CALCIUM 8.5* 8.6* 8.7*  --  8.9  --   --   --   --   --   --  8.5*  MG  --   --   --   --   --  1.7  --   --   --   --   --   --   PHOS  --   --   --   --  3.6  --   --   --   --   --   --   --    < > = values in this interval not displayed.   GFR: Estimated Creatinine Clearance: 33.1 mL/min (A) (by C-G formula based on SCr of 1.47 mg/dL (H)). Liver Function Tests: Recent Labs  Lab 09/16/23 1245 09/18/23 0456  AST 15  --   ALT 23  --   ALKPHOS 73  --   BILITOT 0.3  --   PROT 6.7  --   ALBUMIN 2.9* 2.7*   Radiology Studies: No results found.  Scheduled Meds:  carvedilol   25 mg Oral BID   Chlorhexidine  Gluconate Cloth  6 each Topical Q0600   docusate sodium   200 mg Oral QHS   doxazosin   8 mg Oral QHS   enoxaparin  (LOVENOX ) injection  30 mg Subcutaneous Q24H   levothyroxine   50 mcg Oral Q0600   Continuous Infusions:  famotidine  (PEPCID ) IV 20 mg (09/19/23 1610)   meropenem  (MERREM ) IV 1 g (09/19/23 0937)    LOS: 3 days   Colin Dawley M.D on 09/19/2023 at 9:50 AM  Go to [email or web address] - for contact info  [facility] - Office  [phone]  If 7PM-7AM, please contact night-coverage [email or web address] 09/19/2023, 9:50 AM

## 2023-09-19 NOTE — Plan of Care (Signed)
   Problem: Education: Goal: Knowledge of General Education information will improve Description Including pain rating scale, medication(s)/side effects and non-pharmacologic comfort measures Outcome: Progressing   Problem: Education: Goal: Knowledge of General Education information will improve Description Including pain rating scale, medication(s)/side effects and non-pharmacologic comfort measures Outcome: Progressing

## 2023-09-20 ENCOUNTER — Inpatient Hospital Stay (HOSPITAL_COMMUNITY)

## 2023-09-20 DIAGNOSIS — I4891 Unspecified atrial fibrillation: Secondary | ICD-10-CM

## 2023-09-20 DIAGNOSIS — K5792 Diverticulitis of intestine, part unspecified, without perforation or abscess without bleeding: Secondary | ICD-10-CM | POA: Diagnosis not present

## 2023-09-20 LAB — BASIC METABOLIC PANEL WITH GFR
Anion gap: 10 (ref 5–15)
BUN: 39 mg/dL — ABNORMAL HIGH (ref 8–23)
CO2: 16 mmol/L — ABNORMAL LOW (ref 22–32)
Calcium: 8.6 mg/dL — ABNORMAL LOW (ref 8.9–10.3)
Chloride: 105 mmol/L (ref 98–111)
Creatinine, Ser: 1.41 mg/dL — ABNORMAL HIGH (ref 0.44–1.00)
GFR, Estimated: 38 mL/min — ABNORMAL LOW (ref >60)
Glucose, Bld: 89 mg/dL (ref 70–99)
Potassium: 4.6 mmol/L (ref 3.5–5.1)
Sodium: 131 mmol/L — ABNORMAL LOW (ref 135–145)

## 2023-09-20 LAB — CBC
HCT: 27.8 % — ABNORMAL LOW (ref 36.0–46.0)
Hemoglobin: 9.3 g/dL — ABNORMAL LOW (ref 12.0–15.0)
MCH: 32.2 pg (ref 26.0–34.0)
MCHC: 33.5 g/dL (ref 30.0–36.0)
MCV: 96.2 fL (ref 80.0–100.0)
Platelets: 263 10*3/uL (ref 150–400)
RBC: 2.89 MIL/uL — ABNORMAL LOW (ref 3.87–5.11)
RDW: 15 % (ref 11.5–15.5)
WBC: 19.1 10*3/uL — ABNORMAL HIGH (ref 4.0–10.5)
nRBC: 0 % (ref 0.0–0.2)

## 2023-09-20 LAB — TSH: TSH: 4.316 u[IU]/mL (ref 0.350–4.500)

## 2023-09-20 LAB — GLUCOSE, CAPILLARY: Glucose-Capillary: 83 mg/dL (ref 70–99)

## 2023-09-20 MED ORDER — AMIODARONE HCL IN DEXTROSE 360-4.14 MG/200ML-% IV SOLN
INTRAVENOUS | Status: AC
  Filled 2023-09-20: qty 200

## 2023-09-20 MED ORDER — AMIODARONE HCL IN DEXTROSE 360-4.14 MG/200ML-% IV SOLN
60.0000 mg/h | INTRAVENOUS | Status: AC
  Administered 2023-09-20: 60 mg/h via INTRAVENOUS

## 2023-09-20 MED ORDER — SODIUM CHLORIDE 0.9 % IV SOLN: INTRAVENOUS | Status: AC

## 2023-09-20 MED ORDER — PROCHLORPERAZINE 25 MG RE SUPP
25.0000 mg | Freq: Once | RECTAL | Status: AC
  Administered 2023-09-20: 25 mg via RECTAL
  Filled 2023-09-20: qty 1

## 2023-09-20 MED ORDER — AMIODARONE HCL IN DEXTROSE 360-4.14 MG/200ML-% IV SOLN
30.0000 mg/h | INTRAVENOUS | Status: DC
  Administered 2023-09-20 – 2023-09-25 (×10): 30 mg/h via INTRAVENOUS
  Filled 2023-09-20 (×11): qty 200

## 2023-09-20 MED ORDER — HEPARIN SODIUM (PORCINE) 5000 UNIT/ML IJ SOLN
5000.0000 [IU] | Freq: Three times a day (TID) | INTRAMUSCULAR | Status: DC
Start: 2023-09-20 — End: 2023-09-20

## 2023-09-20 MED ORDER — HEPARIN SODIUM (PORCINE) 5000 UNIT/ML IJ SOLN
5000.0000 [IU] | Freq: Three times a day (TID) | INTRAMUSCULAR | Status: DC
  Administered 2023-09-21 – 2023-09-28 (×20): 5000 [IU] via SUBCUTANEOUS
  Filled 2023-09-20 (×20): qty 1

## 2023-09-20 MED ORDER — DILTIAZEM HCL-DEXTROSE 125-5 MG/125ML-% IV SOLN (PREMIX)
5.0000 mg/h | INTRAVENOUS | Status: DC
  Administered 2023-09-20: 5 mg/h via INTRAVENOUS
  Filled 2023-09-20 (×3): qty 125

## 2023-09-20 MED ORDER — MIDODRINE HCL 5 MG PO TABS
10.0000 mg | ORAL_TABLET | Freq: Once | ORAL | Status: AC
  Administered 2023-09-20: 10 mg via ORAL
  Filled 2023-09-20: qty 2

## 2023-09-20 MED ORDER — LACTATED RINGERS IV BOLUS
500.0000 mL | Freq: Once | INTRAVENOUS | Status: AC
  Administered 2023-09-20: 500 mL via INTRAVENOUS

## 2023-09-20 MED ORDER — ORAL CARE MOUTH RINSE: 15.0000 mL | OROMUCOSAL | Status: DC | PRN

## 2023-09-20 MED ORDER — SODIUM CHLORIDE 0.9 % IV SOLN: INTRAVENOUS | Status: DC

## 2023-09-20 NOTE — Progress Notes (Signed)
  Subjective: Patient still in ICU as she keeps flipping in and out of atrial fibrillation.  She states her abdominal pain has not really changed.  She still has some nausea.  Objective: Vital signs in last 24 hours: Temp:  [97.4 F (36.3 C)-98.6 F (37 C)] 97.8 F (36.6 C) (04/22 0726) Pulse Rate:  [40-173] 56 (04/22 0700) Resp:  [11-25] 20 (04/22 0726) BP: (94-155)/(49-110) 112/67 (04/22 0700) SpO2:  [96 %-99 %] 97 % (04/22 0700) Weight:  [85.3 kg] 85.3 kg (04/22 0327) Last BM Date : 09/15/23  Intake/Output from previous day: 04/21 0701 - 04/22 0700 In: 2603.1 [P.O.:1200; I.V.:142.3; IV Piggyback:1260.8] Out: -  Intake/Output this shift: Total I/O In: 300 [P.O.:300] Out: -   General appearance: alert, cooperative, and no distress GI: Soft with minimal tenderness in the lower abdomen.  No rigidity is noted.  Minimal bowel sounds appreciated.  Lab Results:  Recent Labs    09/19/23 0458 09/20/23 0015  WBC 21.5* 19.1*  HGB 8.9* 9.3*  HCT 27.6* 27.8*  PLT 245 263   BMET Recent Labs    09/19/23 0458 09/20/23 0015  NA 133* 131*  K 4.8 4.6  CL 107 105  CO2 16* 16*  GLUCOSE 83 89  BUN 42* 39*  CREATININE 1.47* 1.41*  CALCIUM 8.5* 8.6*   PT/INR No results for input(s): "LABPROT", "INR" in the last 72 hours.  Studies/Results: No results found.  Anti-infectives: Anti-infectives (From admission, onward)    Start     Dose/Rate Route Frequency Ordered Stop   09/19/23 2200  piperacillin -tazobactam (ZOSYN ) IVPB 3.375 g        3.375 g 12.5 mL/hr over 240 Minutes Intravenous Every 8 hours 09/19/23 1045     09/18/23 1030  meropenem  (MERREM ) 1 g in sodium chloride  0.9 % 100 mL IVPB  Status:  Discontinued        1 g 200 mL/hr over 30 Minutes Intravenous Every 12 hours 09/18/23 0934 09/19/23 1045   09/18/23 1000  ciprofloxacin  (CIPRO ) IVPB 400 mg  Status:  Discontinued        400 mg 200 mL/hr over 60 Minutes Intravenous Every 24 hours 09/17/23 1140 09/18/23 0833    09/17/23 0600  ciprofloxacin  (CIPRO ) IVPB 400 mg  Status:  Discontinued        400 mg 200 mL/hr over 60 Minutes Intravenous Every 12 hours 09/16/23 1954 09/17/23 1140   09/17/23 0600  metroNIDAZOLE  (FLAGYL ) IVPB 500 mg  Status:  Discontinued        500 mg 100 mL/hr over 60 Minutes Intravenous Every 12 hours 09/16/23 1954 09/18/23 0833   09/16/23 1815  ciprofloxacin  (CIPRO ) IVPB 400 mg        400 mg 200 mL/hr over 60 Minutes Intravenous  Once 09/16/23 1805 09/16/23 2013   09/16/23 1815  metroNIDAZOLE  (FLAGYL ) IVPB 500 mg        500 mg 100 mL/hr over 60 Minutes Intravenous  Once 09/16/23 1805 09/16/23 2014       Assessment/Plan: Impression: Sigmoid diverticulitis with coloenteric fistula.  Leukocytosis slowly improving. Plan: Will repeat CT scan of abdomen and pelvis to make sure there is no intra-abdominal abscess or need for more urgent surgery.  Should the CT scan be stable, she can then be anticoagulated given her paroxysmal atrial fibrillation.  Discussed with Dr. Quintella Buck.  LOS: 4 days    Felicia Frank 09/20/2023

## 2023-09-20 NOTE — Plan of Care (Signed)

## 2023-09-20 NOTE — Progress Notes (Signed)
 MEWS Progress Note  Patient Details Name: Felicia Frank MRN: [id number] DOB: 01-03-1946 Today's Date: 09/20/2023   MEWS Flowsheet Documentation:  Assess: MEWS Score Temp: 98.6 F (37 C) BP: 123/69 MAP (mmHg): 85 Pulse Rate: (!) 173 ECG Heart Rate:  (rn notified) Resp: 16 Level of Consciousness: Alert SpO2: 98 % O2 Device: Room Air Assess: MEWS Score MEWS Temp: 0 MEWS Systolic: 0 MEWS Pulse: 3 MEWS RR: 0 MEWS LOC: 0 MEWS Score: 3 MEWS Score Color: Yellow Assess: SIRS CRITERIA SIRS Temperature : 0 SIRS Respirations : 0 SIRS Pulse: 1 SIRS WBC: 0 SIRS Score Sum : 1 SIRS Temperature : 0 SIRS Pulse: 1 SIRS Respirations : 0 SIRS WBC: 0 SIRS Score Sum : 1 Assess: if the MEWS score is Yellow or Red Were vital signs accurate and taken at a resting state?: Yes Does the patient meet 2 or more of the SIRS criteria?: No MEWS guidelines implemented : Yes, yellow Treat MEWS Interventions: Considered administering scheduled or prn medications/treatments as ordered Take Vital Signs Increase Vital Sign Frequency : Yellow: Q2hr x1, continue Q4hrs until patient remains green for 12hrs Escalate MEWS: Escalate: Yellow: Discuss with charge nurse and consider notifying provider and/or RRT Provider Notification Provider Name/Title: Elyse Hand DO Date Provider Notified: 09/19/23 Time Provider Notified: [id number] Provider response: At bedside      [employer] 09/20/2023, 12:45 AM

## 2023-09-20 NOTE — Progress Notes (Signed)
 Progress Note  RN called around 11 PM last night due to patient being in A-fib with RVR.  She just received Coreg  25 mg p.o. in less than 30 minutes prior to the call.  EKG was done and the automated reading on the EKG showed STEMI, so rapid response was called At bedside, EKG personally reviewed showed no ST elevation, but confirmed A-fib with RVR.  Chart was reviewed and it was noted that she had A-fib with RVR on 4/20 with HR in the 150s to 170s EKG, which was treated with IV Cardizem  milligram x 1 push, she remained tachycardic with HR in the 150s to 170s and patient was taken to stepdown unit for initiation IV Cardizem  Posanol drip.  It was decided for him to be taken back to the ICU for IV rate control.  Patient was started on IV Cardizem  drip, unfortunately she did not respond to this, so this was changed to IV amiodarone  drip with successful conversion to normal sinus rhythm.  Critical time: 37 minutes   Critical care personally provided  managing the patient due to high probability of clinically significant and life threatening deterioration. This critical care time included obtaining a history; examining the patient, pulse oximetry; ordering and review of studies; arranging urgent treatment with development of a management plan; evaluation of patient's response of treatment; frequent reassessment; and discussions with other providers.  This critical care time was performed to assess and manage the high probability of imminent and life threatening deterioration that could result in multi-organ failure.  Please refer to admission H&P and progress notes for details regarding the care of this patient

## 2023-09-20 NOTE — Progress Notes (Signed)
 Approx at 2300, tele called with alert that patient's HR was in the 170s-160s and irregular. EKG obtained, pt was noted to be in A-fib with RVR. At that time, patient denied chest pain, sob, or palpation at this time. Provider notified.   Patient later called out stating she was having left shoulder blade pain, denied SOB. Provider notified and repeat EKG obtained per provider order. EKG machine indicated STEMI. Rapid response called. Patient reported patient was easing off, and EKG evaluation by provider indicated that it was not a STEMI but a-fib with RVR.   Patient tx to ICU for closer monitoring and cardiac drip initiation.   Daughter Dawn successfully notified.   All personal belonging including her cell phone and purse sent with patient.

## 2023-09-20 NOTE — Progress Notes (Signed)
 Patient's heart rate remains in the low 50s occasionally dipping into the 40s. Patient is asymptomatic with no complaints. Continued on amiodarone  drip. Dr. Josiah Nigh aware.

## 2023-09-20 NOTE — Progress Notes (Addendum)
 PROGRESS NOTE  Felicia Frank, is a 78 y.o. female, DOB - 01/17/1946, WUJ:[id number]  Admit date - 09/16/2023   Admitting Physician Twilla Galea, DO  Outpatient Primary MD for the patient is [name], [name], MD  LOS - 4  Chief Complaint  Patient presents with   Fall      Brief Narrative:  78 y.o. 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 as well as superior endplate compression fracture of L1   -Assessment and Plan: 1)Acute Sigmoid Diverticulitis with coloenteric fistula--- -- discussed with general surgeon who advises nonoperative approach at this time -WBC 13.1 >>23.0>>> 23.4>>21.5>>19.1  Stopped Cipro  and Flagyl  on 09/18/23  -Continue Zosyn  for now - Repeat CT Abdomen on 09/20/2023 with perforated sigmoid diverticulitis and organizing pelvic area fluid/gas collection measuring 6 x 4.4 CM--with coloenteric fistula and small bowel obstruction with transition point within the distal small bowel adjacent to the inflamed sigmoid colon  --Discussed with general surgeon Dr. Larrie Po -IR consult for drainage requested -c/n  As needed Dilaudid  and oxycodone  for pain control -c/n  As needed Zofran  for nausea and vomiting  2)AKI----acute kidney injury on CKD stage - 3A - Creatinine on admission= 1.46, baseline creatinine = 1.3 (on 06/07/23) per Care Everywhere    , -Creatinine peaked at 2.05 this admission -Creatinine trended down with hydration--currently 1.41 Renally adjust medications, avoid nephrotoxic agents / dehydration  / hypotension  3) paroxysmal atrial fibrillation--new this admission- - Went back into A-fib with RVR overnight on 09/20/23 --required IV cardizem  and V amiodarone  - Continue IV amiodarone  for now -Continue Coreg  -Echo requested -Check TSH - General Surgery request that  wel hold off on full anticoagulation for now  4)HypoNatremia--due to dehydration, emesis with GI losses and poor oral intake -Sodium improving with hydration - c/n IV fluids as ordered  5)HTN--stable, continue Coreg  and Cardura   6)Hypothyroidism--- continue levothyroxine   7)Hyperkalemia--in the setting of worsening renal function/AKI on CKD   -Potassium normalized with Lokelma    8) chronic anemia--Hgb trended down with hemodilution - Baseline hemoglobin usually around 10 - No bleeding concerns  9)L1 Compression Fx---Superior endplate compression fracture of L1 is new since MRI 06/22/2023. There is 15 percent vertebral body height loss centrally. 2 mm of retropulsion of the superior endplate. - As needed pain medication - TLSO brace advised - Outpatient follow-up with spine specialist  CRITICAL CARE Performed by: Colin Dawley  Total critical care time: 38 minutes  Critical care time was exclusive of separately billable procedures and treating other patients.  Went back into A-fib with RVR overnight on 09/20/23 --required IV cardizem  and V amiodarone  - Continue IV amiodarone  for now  Critical care was necessary to treat or prevent imminent or life-threatening deterioration.  Critical care was time spent personally by me on the following activities: development of treatment plan with patient and/or surrogate as well as nursing, discussions with consultants, evaluation of patient's response to treatment, examination of patient, obtaining history from patient or surrogate, ordering and performing treatments and interventions, ordering and review of laboratory studies, ordering and review of radiographic studies, pulse oximetry and re-evaluation of patient's condition.   Status is: Inpatient  Disposition: The patient is from: Home              Anticipated d/c is to: Home  Anticipated d/c date is: > 3 days              Patient currently is not medically stable to  d/c. Barriers: Not Clinically Stable-   Code Status :  -  Code Status: Full Code   Family Communication:    NA (patient is alert, awake and coherent)   DVT Prophylaxis  :   - SCDs   heparin  injection 5,000 Units Start: 09/21/23 2200 Place and maintain sequential compression device Start: 09/20/23 1659 Place TED hose Start: 09/20/23 1659   Lab Results  Component Value Date   PLT 263 09/20/2023   Inpatient Medications  Scheduled Meds:  carvedilol   25 mg Oral BID   Chlorhexidine  Gluconate Cloth  6 each Topical Q0600   docusate sodium   200 mg Oral QHS   doxazosin   8 mg Oral QHS   [START ON 09/21/2023] heparin  injection (subcutaneous)  5,000 Units Subcutaneous Q8H   levothyroxine   50 mcg Oral Q0600   Continuous Infusions:  sodium chloride  10 mL/hr at 09/19/23 2242   sodium chloride      amiodarone  30 mg/hr (09/20/23 1018)   famotidine  (PEPCID ) IV 20 mg (09/20/23 1026)   piperacillin -tazobactam (ZOSYN )  IV 3.375 g (09/20/23 1502)   PRN Meds:.sodium chloride , HYDROmorphone  (DILAUDID ) injection, labetalol , ondansetron  (ZOFRAN ) IV, mouth rinse, oxyCODONE , zolpidem    Anti-infectives (From admission, onward)    Start     Dose/Rate Route Frequency Ordered Stop   09/19/23 2200  piperacillin -tazobactam (ZOSYN ) IVPB 3.375 g        3.375 g 12.5 mL/hr over 240 Minutes Intravenous Every 8 hours 09/19/23 1045     09/18/23 1030  meropenem  (MERREM ) 1 g in sodium chloride  0.9 % 100 mL IVPB  Status:  Discontinued        1 g 200 mL/hr over 30 Minutes Intravenous Every 12 hours 09/18/23 0934 09/19/23 1045   09/18/23 1000  ciprofloxacin  (CIPRO ) IVPB 400 mg  Status:  Discontinued        400 mg 200 mL/hr over 60 Minutes Intravenous Every 24 hours 09/17/23 1140 09/18/23 0833   09/17/23 0600  ciprofloxacin  (CIPRO ) IVPB 400 mg  Status:  Discontinued        400 mg 200 mL/hr over 60 Minutes Intravenous Every 12 hours 09/16/23 1954 09/17/23 1140   09/17/23 0600  metroNIDAZOLE  (FLAGYL ) IVPB 500 mg   Status:  Discontinued        500 mg 100 mL/hr over 60 Minutes Intravenous Every 12 hours 09/16/23 1954 09/18/23 0833   09/16/23 1815  ciprofloxacin  (CIPRO ) IVPB 400 mg        400 mg 200 mL/hr over 60 Minutes Intravenous  Once 09/16/23 1805 09/16/23 2013   09/16/23 1815  metroNIDAZOLE  (FLAGYL ) IVPB 500 mg        500 mg 100 mL/hr over 60 Minutes Intravenous  Once 09/16/23 1805 09/16/23 2014        Subjective: [name] today has no fevers,   No chest pain,   - - Went back into A-fib with RVR overnight--required IV cardizem  and V amiodarone  - Recurrent emesis despite antiemetics  Objective: Vitals:   09/20/23 0900 09/20/23 1000 09/20/23 1100 09/20/23 1200  BP: (!) 115/58  130/62 (!) 116/56  Pulse: (!) 55 (!) 54 (!) 54 (!) 50  Resp: 17 20 20    Temp:      TempSrc:      SpO2: 98% 98% 99%   Weight:      Height:  Intake/Output Summary (Last 24 hours) at 09/20/2023 1702 Last data filed at 09/20/2023 1125 Gross per 24 hour  Intake 2362.32 ml  Output 235 ml  Net 2127.32 ml   Filed Weights   09/18/23 1125 09/20/23 0100 09/20/23 0327  Weight: 84.2 kg 85.3 kg 85.3 kg   Physical Exam  Gen:- Awake Alert,  in no apparent distress  HEENT:- [place].AT, No sclera icterus Nose- NG tube Neck-Supple Neck,No JVD,.  Lungs-  CTAB , fair symmetrical air movement CV- S1, S2 normal, irregular  Abd-  +ve B.Sounds, Abd Soft, left lower quadrant tenderness, some voluntary guarding, no   rebound , No CVA Tenderness Extremity/Skin:- No  edema, pedal pulses present  Psych-affect is appropriate, oriented x3 Neuro-no new focal deficits, no tremors  Data Reviewed: I have personally reviewed following labs and imaging studies  CBC: Recent Labs  Lab 09/16/23 1245 09/17/23 0421 09/18/23 0456 09/19/23 0458 09/20/23 0015  WBC 13.1* 23.0* 23.4* 21.5* 19.1*  NEUTROABS 10.8*  --   --   --   --   HGB 10.6* 9.5* 10.4* 8.9* 9.3*  HCT 31.9* 29.9* 31.4* 27.6* 27.8*  MCV 94.7 98.0 96.0 96.2  96.2  PLT 278 246 251 245 263   Basic Metabolic Panel: Recent Labs  Lab 09/17/23 1046 09/17/23 2039 09/18/23 0028 09/18/23 0456 09/18/23 0500 09/18/23 0807 09/18/23 1655 09/18/23 2042 09/19/23 0123 09/19/23 0458 09/20/23 0015  NA 128* 128*  --  130*  --   --   --   --   --  133* 131*  K 5.7* 5.5*   < > 5.2*  --    < > 4.8 4.8 4.8 4.8 4.6  CL 101 102  --  104  --   --   --   --   --  107 105  CO2 19* 15*  --  14*  --   --   --   --   --  16* 16*  GLUCOSE 108* 101*  --  88  --   --   --   --   --  83 89  BUN 40* 44*  --  46*  --   --   --   --   --  42* 39*  CREATININE 2.05* 2.02*  --  1.82*  --   --   --   --   --  1.47* 1.41*  CALCIUM 8.6* 8.7*  --  8.9  --   --   --   --   --  8.5* 8.6*  MG  --   --   --   --  1.7  --   --   --   --   --   --   PHOS  --   --   --  3.6  --   --   --   --   --   --   --    < > = values in this interval not displayed.   GFR: Estimated Creatinine Clearance: 34.7 mL/min (A) (by C-G formula based on SCr of 1.41 mg/dL (H)). Liver Function Tests: Recent Labs  Lab 09/16/23 1245 09/18/23 0456  AST 15  --   ALT 23  --   ALKPHOS 73  --   BILITOT 0.3  --   PROT 6.7  --   ALBUMIN 2.9* 2.7*   Radiology Studies: CT ABDOMEN PELVIS WO CONTRAST Result Date: 09/20/2023 CLINICAL DATA:  Left lower quadrant abdominal pain, diverticulitis with perforation  EXAM: CT ABDOMEN AND PELVIS WITHOUT CONTRAST TECHNIQUE: Multidetector CT imaging of the abdomen and pelvis was performed following the standard protocol without IV contrast. Unenhanced CT was performed per clinician order. Lack of IV contrast limits sensitivity and specificity, especially for evaluation of abdominal/pelvic solid viscera. RADIATION DOSE REDUCTION: This exam was performed according to the departmental dose-optimization program which includes automated exposure control, adjustment of the mA and/or kV according to patient size and/or use of iterative reconstruction technique. COMPARISON:   09/16/2023 FINDINGS: Lower chest: Trace bilateral pleural effusions. No acute airspace disease. Hepatobiliary: Unremarkable unenhanced appearance of the liver. Prior cholecystectomy. Pancreas: Unremarkable unenhanced appearance. Spleen: Unremarkable unenhanced appearance. Adrenals/Urinary Tract: No urinary tract calculi or obstructive uropathy within either kidney. The adrenals and bladder are grossly unremarkable. Stomach/Bowel: The acute sigmoid diverticulitis seen previously is again identified, with enlarging gas and fluid collection adjacent to the inflamed sigmoid colon reference image 68/5, measuring 5.8 x 4.4 cm. The suspected colo enteric fistula previously identified is again noted, though slightly less pronounced on this exam, reference images 72-75 of series 5. Since the previous exam, free intraperitoneal gas is seen within the upper abdomen adjacent to the ventral margin of the liver. There is new distension of the small bowel, measuring up to 3.5 cm in diameter with multiple gas fluid levels. Transition point is seen within the pelvis adjacent to the area of inflamed sigmoid colon, consistent with small-bowel obstruction likely secondary to adhesions. Vascular/Lymphatic: Stable atherosclerosis of the aorta. No pathologic adenopathy. Reproductive: Prior hysterectomy.  No adnexal masses. Other: Small amount of free fluid within the dependent lower pelvis, increased since prior study. As discussed above, organizing gas/fluid collection adjacent to the inflamed sigmoid colon has increased in size since prior study, now measuring 5.8 x 4.4 cm reference image 60/5. New punctate foci of pneumoperitoneum are seen within the right upper quadrant of the abdomen consistent with perforated sigmoid diverticulitis noted previously. No abdominal wall hernia. Musculoskeletal: Stable acute to subacute compression deformity superior endplate L1 vertebral body. No new acute or destructive bony abnormalities.  Reconstructed images demonstrate no additional findings. IMPRESSION: 1. Perforated sigmoid diverticulitis, with enlarged organizing gas/fluid collection in the lower pelvis now measuring up to 5.8 x 4.4 cm. New punctate foci of free intraperitoneal gas in the right upper quadrant, as well as increased free fluid within the lower pelvis. 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 2. Interval development of small bowel obstruction, with transition point within the distal small bowel adjacent to the inflamed sigmoid colon and organizing pelvic fluid collection described above. 3. Trace bilateral pleural effusions. 4.  Aortic Atherosclerosis (ICD10-I70.0). These results will be called to the ordering clinician or representative by the Radiologist Assistant, and communication documented in the PACS or [employer]. Electronically Signed   By: Bobbye Burrow M.D.   On: 09/20/2023 16:16   Scheduled Meds:  carvedilol   25 mg Oral BID   Chlorhexidine  Gluconate Cloth  6 each Topical Q0600   docusate sodium   200 mg Oral QHS   doxazosin   8 mg Oral QHS   [START ON 09/21/2023] heparin  injection (subcutaneous)  5,000 Units Subcutaneous Q8H   levothyroxine   50 mcg Oral Q0600   Continuous Infusions:  sodium chloride  10 mL/hr at 09/19/23 2242   sodium chloride      amiodarone  30 mg/hr (09/20/23 1018)   famotidine  (PEPCID ) IV 20 mg (09/20/23 1026)   piperacillin -tazobactam (ZOSYN )  IV 3.375 g (09/20/23 1502)  LOS: 4 days   Colin Dawley M.D on 09/20/2023 at 5:02 PM  Go to [email or web address] - for contact info  [facility] - Office  [phone]  If 7PM-7AM, please contact night-coverage [email or web address] 09/20/2023, 5:02 PM

## 2023-09-20 NOTE — Progress Notes (Signed)
 CT scan report reviewed.  Will get interventional radiology involved in draining pelvic abscess.  Trying to avoid surgery as she would require a colostomy should she be taken to the operating room at the present time.  Would hold off on anticoagulation until she is drained.  Discussed with Dr. Quintella Buck.

## 2023-09-21 ENCOUNTER — Ambulatory Visit (HOSPITAL_COMMUNITY)
Admission: RE | Admit: 2023-09-21 | Discharge: 2023-09-21 | Disposition: A | Discharge status: Home / Self Care | Source: Ambulatory Visit | Attending: Family Medicine | Admitting: Family Medicine

## 2023-09-21 ENCOUNTER — Inpatient Hospital Stay (HOSPITAL_COMMUNITY)

## 2023-09-21 DIAGNOSIS — I10 Essential (primary) hypertension: Secondary | ICD-10-CM | POA: Diagnosis not present

## 2023-09-21 DIAGNOSIS — I4891 Unspecified atrial fibrillation: Secondary | ICD-10-CM | POA: Diagnosis not present

## 2023-09-21 DIAGNOSIS — E039 Hypothyroidism, unspecified: Secondary | ICD-10-CM | POA: Diagnosis not present

## 2023-09-21 DIAGNOSIS — E871 Hypo-osmolality and hyponatremia: Secondary | ICD-10-CM

## 2023-09-21 DIAGNOSIS — K219 Gastro-esophageal reflux disease without esophagitis: Secondary | ICD-10-CM

## 2023-09-21 DIAGNOSIS — N1831 Chronic kidney disease, stage 3a: Secondary | ICD-10-CM

## 2023-09-21 DIAGNOSIS — K5732 Diverticulitis of large intestine without perforation or abscess without bleeding: Secondary | ICD-10-CM

## 2023-09-21 DIAGNOSIS — S32010D Wedge compression fracture of first lumbar vertebra, subsequent encounter for fracture with routine healing: Secondary | ICD-10-CM

## 2023-09-21 DIAGNOSIS — K5780 Diverticulitis of intestine, part unspecified, with perforation and abscess without bleeding: Secondary | ICD-10-CM | POA: Insufficient documentation

## 2023-09-21 DIAGNOSIS — K632 Fistula of intestine: Secondary | ICD-10-CM

## 2023-09-21 DIAGNOSIS — K5792 Diverticulitis of intestine, part unspecified, without perforation or abscess without bleeding: Secondary | ICD-10-CM | POA: Diagnosis not present

## 2023-09-21 LAB — CBC
HCT: 30 % — ABNORMAL LOW (ref 36.0–46.0)
Hemoglobin: 9.4 g/dL — ABNORMAL LOW (ref 12.0–15.0)
MCH: 30.6 pg (ref 26.0–34.0)
MCHC: 31.3 g/dL (ref 30.0–36.0)
MCV: 97.7 fL (ref 80.0–100.0)
Platelets: 262 10*3/uL (ref 150–400)
RBC: 3.07 MIL/uL — ABNORMAL LOW (ref 3.87–5.11)
RDW: 14.8 % (ref 11.5–15.5)
WBC: 15.8 10*3/uL — ABNORMAL HIGH (ref 4.0–10.5)
nRBC: 0 % (ref 0.0–0.2)

## 2023-09-21 LAB — BASIC METABOLIC PANEL WITH GFR
Anion gap: 7 (ref 5–15)
BUN: 33 mg/dL — ABNORMAL HIGH (ref 8–23)
CO2: 20 mmol/L — ABNORMAL LOW (ref 22–32)
Calcium: 8.2 mg/dL — ABNORMAL LOW (ref 8.9–10.3)
Chloride: 104 mmol/L (ref 98–111)
Creatinine, Ser: 1.26 mg/dL — ABNORMAL HIGH (ref 0.44–1.00)
GFR, Estimated: 44 mL/min — ABNORMAL LOW (ref >60)
Glucose, Bld: 85 mg/dL (ref 70–99)
Potassium: 4.2 mmol/L (ref 3.5–5.1)
Sodium: 131 mmol/L — ABNORMAL LOW (ref 135–145)

## 2023-09-21 LAB — PROTIME-INR
INR: 1.1 (ref 0.8–1.2)
Prothrombin Time: 14.6 s (ref 11.4–15.2)

## 2023-09-21 LAB — ECHOCARDIOGRAM COMPLETE
AR max vel: 1.93 cm2
AV Area VTI: 1.88 cm2
AV Area mean vel: 1.72 cm2
AV Mean grad: 4 mmHg
AV Peak grad: 10.9 mmHg
Ao pk vel: 1.65 m/s
Area-P 1/2: 2.65 cm2
Calc EF: 67.7 %
Height: 64 in
S' Lateral: 2.3 cm
Single Plane A2C EF: 70.8 %
Single Plane A4C EF: 65.6 %
Weight: 3008.84 [oz_av]

## 2023-09-21 LAB — GLUCOSE, CAPILLARY
Glucose-Capillary: 87 mg/dL (ref 70–99)
Glucose-Capillary: 79 mg/dL (ref 70–99)
Glucose-Capillary: 65 mg/dL — ABNORMAL LOW (ref 70–99)
Glucose-Capillary: 158 mg/dL — ABNORMAL HIGH (ref 70–99)

## 2023-09-21 MED ORDER — PERFLUTREN LIPID MICROSPHERE
1.0000 mL | INTRAVENOUS | Status: AC | PRN
  Administered 2023-09-21: 2 mL via INTRAVENOUS

## 2023-09-21 MED ORDER — FENTANYL CITRATE (PF) 100 MCG/2ML IJ SOLN
INTRAMUSCULAR | Status: AC | PRN
  Administered 2023-09-21 (×2): 25 ug via INTRAVENOUS

## 2023-09-21 MED ORDER — DEXTROSE-SODIUM CHLORIDE 5-0.9 % IV SOLN: INTRAVENOUS | Status: AC

## 2023-09-21 MED ORDER — MIDAZOLAM HCL 2 MG/2ML IJ SOLN
INTRAMUSCULAR | Status: AC | PRN
  Administered 2023-09-21: 1 mg via INTRAVENOUS

## 2023-09-21 MED ORDER — DEXTROSE 50 % IV SOLN
25.0000 mL | Freq: Once | INTRAVENOUS | Status: AC
  Administered 2023-09-21: 25 mL via INTRAVENOUS
  Filled 2023-09-21: qty 50

## 2023-09-21 MED ORDER — MIDAZOLAM HCL 2 MG/2ML IJ SOLN
INTRAMUSCULAR | Status: AC
  Filled 2023-09-21: qty 2

## 2023-09-21 MED ORDER — FENTANYL CITRATE (PF) 100 MCG/2ML IJ SOLN
INTRAMUSCULAR | Status: AC
  Filled 2023-09-21: qty 2

## 2023-09-21 MED ORDER — LIDOCAINE-EPINEPHRINE 1 %-1:100000 IJ SOLN
20.0000 mL | Freq: Once | INTRAMUSCULAR | Status: AC
  Administered 2023-09-21: 20 mL

## 2023-09-21 NOTE — Consult Note (Signed)
 Chief Complaint: Patient was seen in consultation today for diverticular abscess   Referring Physician(s): Dr. Quintella Buck  Supervising Physician: Creasie Doctor  Patient Status: Cristine Done - inpatient  History of Present Illness: Denyla Cortese is a 78 y.o. female with a medical history significant for HTN, back pain with several surgeries and CKD. She presented to the [facility] ED 09/16/23 with decreased appetite and failure to thrive for approximately one month. Patient also had unintentional weight loss of 20 lb. She had fallen several days prior to arriving at the ED and was found to have a compression fracture.  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 No intra-abdominal abscess or free air was noted. She was admitted for treatment and the Surgery team was consulted. The recommendation was for conservative treatment and she was started in IV antibiotics. Her clinical course was complicated by the development of atrial fibrillation with RVR and she was transferred to the ICU.   A repeat CT was obtained yesterday and this showed an enlarged, organizing gas/fluid collection in the lower pelvis.   CT abdomen/pelvis without contrast 09/20/23  IMPRESSION: 1. Perforated sigmoid diverticulitis, with enlarged organizing gas/fluid collection in the lower pelvis now measuring up to 5.8 x 4.4 cm. New punctate foci of free intraperitoneal gas in the right upper quadrant, as well as increased free fluid within the lower pelvis. 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 2. Interval development of small bowel obstruction, with transition point within the distal small bowel adjacent to the inflamed sigmoid colon and organizing pelvic fluid collection described above. 3. Trace bilateral pleural effusions. 4.  Aortic Atherosclerosis (ICD10-I70.0).  She developed worsening  nausea/vomiting last night and an NG tube was placed. Interventional Radiology has been asked to evaluate this patient for an image-guided diverticular abscess aspiration with possible drain placement. Imaging reviewed and procedure approved by Dr. Jinx Mourning.   Past Medical History:  Diagnosis Date   HTN (hypertension)    Hypothyroidism     Past Surgical History:  Procedure Laterality Date   ABDOMINAL HYSTERECTOMY     BACK SURGERY     CHOLECYSTECTOMY     TONSILLECTOMY      Allergies: Amlodipine and Clonidine  Medications: Prior to Admission medications   Medication Sig Start Date End Date Taking? Authorizing Provider  aspirin EC 81 MG tablet Take 81 mg by mouth daily. Swallow whole.   Yes [provider]  carvedilol  (COREG ) 25 MG tablet Take 25 mg by mouth 2 (two) times daily. 12/02/20  Yes [provider]  cloNIDine (CATAPRES) 0.1 MG tablet Take 0.1 mg by mouth 2 (two) times daily as needed (high bp). 06/09/20  Yes [provider]  doxazosin  (CARDURA ) 8 MG tablet Take 8 mg by mouth at bedtime. 12/02/20  Yes [provider]  estradiol (ESTRACE) 0.1 MG/GM vaginal cream Place 0.5 g vaginally as needed (vaginal dryness/irritation). Twice a week PRN 11/06/12  Yes [provider]  hydrALAZINE (APRESOLINE) 25 MG tablet Take 25 mg by mouth in the morning and at bedtime. 08/18/23 08/17/24 Yes [provider]  levothyroxine  (SYNTHROID ) 50 MCG tablet Take 50 mcg by mouth daily. 03/15/17  Yes [provider]  zolpidem  (AMBIEN ) 10 MG tablet Take 10 mg by mouth at bedtime as needed for sleep. 12/11/20  Yes [provider]     Family History  Problem Relation Age of Onset   Colon cancer Neg Hx  Colon polyps Neg Hx     Social History   Socioeconomic History   Marital status: Married    Spouse name: Not on file   Number of children: Not on file   Years of education: Not on file   Highest education level: Not on file   Occupational History   Not on file  Tobacco Use   Smoking status: Never   Smokeless tobacco: Never  Substance and Sexual Activity   Alcohol use: Never   Drug use: Never   Sexual activity: Not on file  Other Topics Concern   Not on file  Social History Narrative   Not on file   Social Drivers of Health   Financial Resource Strain: Not on file  Food Insecurity: No Food Insecurity (09/16/2023)   Hunger Vital Sign    Worried About Running Out of Food in the Last Year: Never true    Ran Out of Food in the Last Year: Never true  Transportation Needs: No Transportation Needs (09/16/2023)   PRAPARE - Administrator, Civil Service (Medical): No    Lack of Transportation (Non-Medical): No  Physical Activity: Not on file  Stress: Not on file  Social Connections: Socially Integrated (09/16/2023)   Social Connection and Isolation Panel [NHANES]    Frequency of Communication with Friends and Family: More than three times a week    Frequency of Social Gatherings with Friends and Family: More than three times a week    Attends Religious Services: More than 4 times per year    Active Member of [employer] or Organizations: Yes    Attends Engineer, structural: More than 4 times per year    Marital Status: Married    Review of Systems: A 12 point ROS discussed and pertinent positives are indicated in the HPI above.  All other systems are negative.  Review of Systems  Constitutional:  Positive for appetite change and fatigue.  Respiratory:  Negative for cough and shortness of breath.   Cardiovascular:  Negative for chest pain and leg swelling.  Gastrointestinal:  Positive for abdominal pain, nausea and vomiting.  Musculoskeletal:  Positive for back pain.    Vital Signs: BP (!) 146/46   Pulse 68   Temp 97.9 F (36.6 C) (Oral)   Resp 16   Ht 5\' 4"  (1.626 m)   Wt 188 lb 0.8 oz (85.3 kg)   SpO2 98%   BMI 32.28 kg/m   Physical Exam Constitutional:      Appearance: She  is not ill-appearing.  HENT:     Nose:     Comments: NG tube Cardiovascular:     Rate and Rhythm: Normal rate.  Pulmonary:     Effort: Pulmonary effort is normal.  Abdominal:     Tenderness: There is abdominal tenderness.  Skin:    General: Skin is warm and dry.  Neurological:     Mental Status: She is alert and oriented to person, place, and time.  Psychiatric:        Mood and Affect: Mood normal.        Behavior: Behavior normal.        Thought Content: Thought content normal.        Judgment: Judgment normal.     Imaging: DG CHEST PORT 1 VIEW Result Date: 09/20/2023 CLINICAL DATA:  Nasogastric tube placement. EXAM: PORTABLE CHEST 1 VIEW COMPARISON:  None Available. FINDINGS: Mild cardiomegaly. Nasogastric tube tip is seen in expected position of stomach. Both  lungs are clear. The visualized skeletal structures are unremarkable. IMPRESSION: Nasogastric tube tip seen in expected position of stomach. Electronically Signed   By: Rosalene Colon M.D.   On: 09/20/2023 17:47   CT ABDOMEN PELVIS WO CONTRAST Result Date: 09/20/2023 CLINICAL DATA:  Left lower quadrant abdominal pain, diverticulitis with perforation EXAM: CT ABDOMEN AND PELVIS WITHOUT CONTRAST TECHNIQUE: Multidetector CT imaging of the abdomen and pelvis was performed following the standard protocol without IV contrast. Unenhanced CT was performed per clinician order. Lack of IV contrast limits sensitivity and specificity, especially for evaluation of abdominal/pelvic solid viscera. RADIATION DOSE REDUCTION: This exam was performed according to the departmental dose-optimization program which includes automated exposure control, adjustment of the mA and/or kV according to patient size and/or use of iterative reconstruction technique. COMPARISON:  09/16/2023 FINDINGS: Lower chest: Trace bilateral pleural effusions. No acute airspace disease. Hepatobiliary: Unremarkable unenhanced appearance of the liver. Prior cholecystectomy.  Pancreas: Unremarkable unenhanced appearance. Spleen: Unremarkable unenhanced appearance. Adrenals/Urinary Tract: No urinary tract calculi or obstructive uropathy within either kidney. The adrenals and bladder are grossly unremarkable. Stomach/Bowel: The acute sigmoid diverticulitis seen previously is again identified, with enlarging gas and fluid collection adjacent to the inflamed sigmoid colon reference image 68/5, measuring 5.8 x 4.4 cm. The suspected colo enteric fistula previously identified is again noted, though slightly less pronounced on this exam, reference images 72-75 of series 5. Since the previous exam, free intraperitoneal gas is seen within the upper abdomen adjacent to the ventral margin of the liver. There is new distension of the small bowel, measuring up to 3.5 cm in diameter with multiple gas fluid levels. Transition point is seen within the pelvis adjacent to the area of inflamed sigmoid colon, consistent with small-bowel obstruction likely secondary to adhesions. Vascular/Lymphatic: Stable atherosclerosis of the aorta. No pathologic adenopathy. Reproductive: Prior hysterectomy.  No adnexal masses. Other: Small amount of free fluid within the dependent lower pelvis, increased since prior study. As discussed above, organizing gas/fluid collection adjacent to the inflamed sigmoid colon has increased in size since prior study, now measuring 5.8 x 4.4 cm reference image 60/5. New punctate foci of pneumoperitoneum are seen within the right upper quadrant of the abdomen consistent with perforated sigmoid diverticulitis noted previously. No abdominal wall hernia. Musculoskeletal: Stable acute to subacute compression deformity superior endplate L1 vertebral body. No new acute or destructive bony abnormalities. Reconstructed images demonstrate no additional findings. IMPRESSION: 1. Perforated sigmoid diverticulitis, with enlarged organizing gas/fluid collection in the lower pelvis now measuring up to  5.8 x 4.4 cm. New punctate foci of free intraperitoneal gas in the right upper quadrant, as well as increased free fluid within the lower pelvis. 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 2. Interval development of small bowel obstruction, with transition point within the distal small bowel adjacent to the inflamed sigmoid colon and organizing pelvic fluid collection described above. 3. Trace bilateral pleural effusions. 4.  Aortic Atherosclerosis (ICD10-I70.0). These results will be called to the ordering clinician or representative by the Radiologist Assistant, and communication documented in the PACS or [employer]. Electronically Signed   By: Bobbye Burrow M.D.   On: 09/20/2023 16:16   CT ABDOMEN PELVIS W CONTRAST Result Date: 09/16/2023 CLINICAL DATA:  Abdominal and back pain after fall on Wednesday. Unable to eat for 2 weeks. Emesis after eating. Recent weight loss of 20 pounds. Back surgery in November of 2024 EXAM: CT ABDOMEN AND PELVIS WITH CONTRAST CT Lumbar  Spine with contrast TECHNIQUE: Technique: Multiplanar CT images of the lumbar spine were reconstructed from contemporary CT of the Abdomen and Pelvis. Multidetector CT imaging of the abdomen and pelvis was performed using the standard protocol following bolus administration of intravenous contrast. RADIATION DOSE REDUCTION: This exam was performed according to the departmental dose-optimization program which includes automated exposure control, adjustment of the mA and/or kV according to patient size and/or use of iterative reconstruction technique. CONTRAST:  80mL OMNIPAQUE  IOHEXOL  300 MG/ML  SOLN COMPARISON:  CT abdomen pelvis 10/05/2021 and MRI lumbar spine 06/22/2023 FINDINGS: ABDOMEN/PELVIS: Lower chest: No acute abnormality. Hepatobiliary: Unremarkable liver. Cholecystectomy. No biliary dilation. Pancreas: Fatty atrophy.  No acute abnormality. Spleen: Unremarkable. Adrenals/Urinary  Tract: Stable adrenal glands and kidneys. No urinary calculi or hydronephrosis. Bladder is unremarkable. Stomach/Bowel: Sigmoid diverticulosis. Wall thickening and adjacent stranding about the sigmoid colon there is a tract of gas and fluid extending from the inflamed sigmoid colon to an adjacent loop of small bowel compatible with coloenteric fistula (series 3/image 50 9-66). Normal caliber large and small bowel. Moderate colonic stool burden. The appendix is not visualized.Stomach is within normal limits. Vascular/Lymphatic: Aortic atherosclerosis. No enlarged abdominal or pelvic lymph nodes. Reproductive: Status post hysterectomy. No adnexal masses. Other: Free fluid in the pelvis along the inflamed sigmoid colon and the colo enteric fistula. Musculoskeletal: No acute fracture in the pelvis. LUMBAR SPINE: Segmentation: 5 lumbar type vertebrae. Alignment: No evidence of traumatic listhesis. Chronic retrolisthesis of L3 and L5. Vertebrae: Superior endplate compression fracture of L1 is new since MRI 06/22/2023. There is 15 percent vertebral body height loss centrally. 2 mm of retropulsion of the superior endplate. Paraspinal and other soft tissues: See above. Disc levels: Multilevel spondylosis, disc space height loss, degenerative endplate changes greatest at L3-L4. Spinal canal narrowing is greatest at L4-L5 where it is moderate. IMPRESSION: 1. Acute sigmoid diverticulitis with coloenteric fistula. 2. Superior endplate compression fracture of L1 is new since MRI 06/22/2023. There is 15 percent vertebral body height loss centrally. 2 mm of retropulsion of the superior endplate. Electronically Signed   By: Rozell Cornet M.D.   On: 09/16/2023 17:59   CT L-SPINE NO CHARGE Result Date: 09/16/2023 CLINICAL DATA:  Abdominal and back pain after fall on Wednesday. Unable to eat for 2 weeks. Emesis after eating. Recent weight loss of 20 pounds. Back surgery in November of 2024 EXAM: CT ABDOMEN AND PELVIS WITH CONTRAST  CT Lumbar Spine with contrast TECHNIQUE: Technique: Multiplanar CT images of the lumbar spine were reconstructed from contemporary CT of the Abdomen and Pelvis. Multidetector CT imaging of the abdomen and pelvis was performed using the standard protocol following bolus administration of intravenous contrast. RADIATION DOSE REDUCTION: This exam was performed according to the departmental dose-optimization program which includes automated exposure control, adjustment of the mA and/or kV according to patient size and/or use of iterative reconstruction technique. CONTRAST:  80mL OMNIPAQUE  IOHEXOL  300 MG/ML  SOLN COMPARISON:  CT abdomen pelvis 10/05/2021 and MRI lumbar spine 06/22/2023 FINDINGS: ABDOMEN/PELVIS: Lower chest: No acute abnormality. Hepatobiliary: Unremarkable liver. Cholecystectomy. No biliary dilation. Pancreas: Fatty atrophy.  No acute abnormality. Spleen: Unremarkable. Adrenals/Urinary Tract: Stable adrenal glands and kidneys. No urinary calculi or hydronephrosis. Bladder is unremarkable. Stomach/Bowel: Sigmoid diverticulosis. Wall thickening and adjacent stranding about the sigmoid colon there is a tract of gas and fluid extending from the inflamed sigmoid colon to an adjacent loop of small bowel compatible with coloenteric fistula (series 3/image 50 9-66). Normal caliber large and small bowel. Moderate colonic  stool burden. The appendix is not visualized.Stomach is within normal limits. Vascular/Lymphatic: Aortic atherosclerosis. No enlarged abdominal or pelvic lymph nodes. Reproductive: Status post hysterectomy. No adnexal masses. Other: Free fluid in the pelvis along the inflamed sigmoid colon and the colo enteric fistula. Musculoskeletal: No acute fracture in the pelvis. LUMBAR SPINE: Segmentation: 5 lumbar type vertebrae. Alignment: No evidence of traumatic listhesis. Chronic retrolisthesis of L3 and L5. Vertebrae: Superior endplate compression fracture of L1 is new since MRI 06/22/2023. There is  15 percent vertebral body height loss centrally. 2 mm of retropulsion of the superior endplate. Paraspinal and other soft tissues: See above. Disc levels: Multilevel spondylosis, disc space height loss, degenerative endplate changes greatest at L3-L4. Spinal canal narrowing is greatest at L4-L5 where it is moderate. IMPRESSION: 1. Acute sigmoid diverticulitis with coloenteric fistula. 2. Superior endplate compression fracture of L1 is new since MRI 06/22/2023. There is 15 percent vertebral body height loss centrally. 2 mm of retropulsion of the superior endplate. Electronically Signed   By: Rozell Cornet M.D.   On: 09/16/2023 17:59    Labs:  CBC: Recent Labs    09/18/23 0456 09/19/23 0458 09/20/23 0015 09/21/23 0441  WBC 23.4* 21.5* 19.1* 15.8*  HGB 10.4* 8.9* 9.3* 9.4*  HCT 31.4* 27.6* 27.8* 30.0*  PLT 251 245 263 262    COAGS: Recent Labs    09/21/23 0611  INR 1.1    BMP: Recent Labs    09/18/23 0456 09/18/23 0807 09/19/23 0123 09/19/23 0458 09/20/23 0015 09/21/23 0441  NA 130*  --   --  133* 131* 131*  K 5.2*   < > 4.8 4.8 4.6 4.2  CL 104  --   --  107 105 104  CO2 14*  --   --  16* 16* 20*  GLUCOSE 88  --   --  83 89 85  BUN 46*  --   --  42* 39* 33*  CALCIUM 8.9  --   --  8.5* 8.6* 8.2*  CREATININE 1.82*  --   --  1.47* 1.41* 1.26*  GFRNONAA 28*  --   --  36* 38* 44*   < > = values in this interval not displayed.    LIVER FUNCTION TESTS: Recent Labs    09/16/23 1245 09/18/23 0456  BILITOT 0.3  --   AST 15  --   ALT 23  --   ALKPHOS 73  --   PROT 6.7  --   ALBUMIN 2.9* 2.7*    TUMOR MARKERS: No results for input(s): "AFPTM", "CEA", "CA199", "CHROMGRNA" in the last 8760 hours.  Assessment and Plan:  Perforated sigmoid diverticulitis with pelvic abscess: [name], 78 year old female, is tentatively scheduled today for an image-guided diverticular abscess aspiration with possible drain placement. The procedure was discussed with the patient,  her granddaughter and another family member at the bedside in the ICU. The procedure and post-procedure drain care/follow up was discussed. We discussed the uncertainty of how long the drain will need to remain in place. We also discussed the timing of the procedure in regards to [facility] transport. They are aware if [facility] is unable to get the patient to [facility] by 2 pm today the procedure will be re-attempted 09/22/23. The patient will return to [facility] post-drain placement. Patient and her family members acknowledged information and are agreeable to proceed.   Risks and benefits discussed with the patient including bleeding, infection, damage to adjacent structures, bowel perforation/fistula connection, and sepsis.  All of the patient's questions were answered, patient is agreeable to proceed. She has been NPO.   Consent signed and in [facility] packet.    Thank you for this interesting consult.  I greatly enjoyed meeting Jorden Casino and look forward to participating in their care.  A copy of this report was sent to the requesting provider on this date.  Electronically Signed: Jetta Morrow, AGACNP-BC 09/21/2023, 8:20 AM   I spent a total of 20 Minutes    in face to face in clinical consultation, greater than 50% of which was counseling/coordinating care for diverticular abscess.

## 2023-09-21 NOTE — Plan of Care (Signed)

## 2023-09-21 NOTE — Progress Notes (Signed)
 PROGRESS NOTE  Felicia Frank, is a 78 y.o. female, DOB - May 19, 1946, ZOX:[id number]  Admit date - 09/16/2023   Admitting Physician Twilla Galea, DO  Outpatient Primary MD for the patient is [name], [name], MD  LOS - 5  Chief Complaint  Patient presents with   Fall      Brief Narrative:  78 y.o. 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 as well as superior endplate compression fracture of L1   -Assessment and Plan: 1)Acute Sigmoid Diverticulitis with coloenteric fistula--- -- discussed with general surgeon who advises nonoperative approach at this time -WBC 13.1 >>23.0>>> 23.4>>21.5>>19.1  Stopped Cipro  and Flagyl  on 09/18/23  -Continue Zosyn  for now - Repeat CT Abdomen on 09/20/2023 with perforated sigmoid diverticulitis and organizing pelvic area fluid/gas collection measuring 6 x 4.4 CM--with coloenteric fistula and small bowel obstruction with transition point within the distal small bowel adjacent to the inflamed sigmoid colon  --Discussed with general surgeon Dr. Larrie Po -IR consult for drainage requested; planning for drain placement later today. -c/n  As needed Dilaudid  and oxycodone  for pain control -c/n  As needed Zofran  for nausea and vomiting - Follow clinical response.  2)AKI----acute kidney injury on CKD stage - 3A - Creatinine on admission= 1.46, baseline creatinine = 1.3 (on 06/07/23) per Care Everywhere    , -Creatinine peaked at 2.05 this admission -Creatinine trended down with hydration--currently 1.26 and is stable -Continue to renally adjust medications, avoid nephrotoxic agents / dehydration  / hypotension - Maintain adequate hydration.  3) paroxysmal atrial fibrillation--new this admission- - Went back into A-fib with RVR overnight on 09/20/23 --required IV  cardizem  and V amiodarone  - Continue Coreg  and IV amiodarone . -Echo requested - TSH within normal limits. - General Surgery request that wel hold off on full anticoagulation for now, in case she ended needing any surgical intervention.  4)HypoNatremia--due to dehydration, emesis with GI losses/NG tube and poor oral intake -Sodium improving with hydration - c/n IV fluids as ordered and follow electrolytes trend.  5)HTN- -stable vital - Continue treatment with Cardura  and carvedilol .  6)Hypothyroidism-- -continue treatment with Synthroid .  7)Hyperkalemia--in the setting of worsening renal function/AKI on CKD   -Potassium normalized with Lokelma   - Continue to follow ultralights trend.  8) chronic anemia--Hgb trended down with hemodilution - Baseline hemoglobin usually around 10 - No bleeding concerns  9)L1 Compression Fx---Superior endplate compression fracture of L1 is new since MRI 06/22/2023. There is 15 percent vertebral body height loss centrally. 2 mm of retropulsion of the superior endplate. - As needed pain medication - TLSO brace advised - Outpatient follow-up with spine specialist  Status is: Inpatient  Disposition: The patient is from: Home              Anticipated d/c is to: Home              Anticipated d/c date is: > 3 days              Patient currently is not medically stable to d/c.  Barriers: Not Clinically Stable-   Code Status :  -  Code Status: Full Code   Family Communication:   Daughter at bedside.  CRITICAL CARE Performed by: Justina Oman   Total critical care time: 45 minutes  Critical care time was exclusive of separately billable procedures and treating other  patients.  Critical care was necessary to treat or prevent imminent or life-threatening deterioration.  Critical care was time spent personally by me on the following activities: development of treatment plan with patient and/or surrogate as well as nursing, discussions with  consultants, evaluation of patient's response to treatment, examination of patient, obtaining history from patient or surrogate, ordering and performing treatments and interventions, ordering and review of laboratory studies, ordering and review of radiographic studies, pulse oximetry and re-evaluation of patient's condition.   DVT Prophylaxis  :   - SCDs   heparin  injection 5,000 Units Start: 09/21/23 2200 Place and maintain sequential compression device Start: 09/20/23 1659 Place TED hose Start: 09/20/23 1659   Lab Results  Component Value Date   PLT 262 09/21/2023   Inpatient Medications  Scheduled Meds:  carvedilol   25 mg Oral BID   Chlorhexidine  Gluconate Cloth  6 each Topical Q0600   docusate sodium   200 mg Oral QHS   doxazosin   8 mg Oral QHS   heparin  injection (subcutaneous)  5,000 Units Subcutaneous Q8H   levothyroxine   50 mcg Oral Q0600   Continuous Infusions:  amiodarone  30 mg/hr (09/21/23 1030)   dextrose  5 % and 0.9 % NaCl 100 mL/hr at 09/21/23 2128   famotidine  (PEPCID ) IV 20 mg (09/21/23 0917)   piperacillin -tazobactam (ZOSYN )  IV 3.375 g (09/21/23 1833)   PRN Meds:.HYDROmorphone  (DILAUDID ) injection, labetalol , ondansetron  (ZOFRAN ) IV, mouth rinse, oxyCODONE , zolpidem    Anti-infectives (From admission, onward)    Start     Dose/Rate Route Frequency Ordered Stop   09/19/23 2200  piperacillin -tazobactam (ZOSYN ) IVPB 3.375 g        3.375 g 12.5 mL/hr over 240 Minutes Intravenous Every 8 hours 09/19/23 1045     09/18/23 1030  meropenem  (MERREM ) 1 g in sodium chloride  0.9 % 100 mL IVPB  Status:  Discontinued        1 g 200 mL/hr over 30 Minutes Intravenous Every 12 hours 09/18/23 0934 09/19/23 1045   09/18/23 1000  ciprofloxacin  (CIPRO ) IVPB 400 mg  Status:  Discontinued        400 mg 200 mL/hr over 60 Minutes Intravenous Every 24 hours 09/17/23 1140 09/18/23 0833   09/17/23 0600  ciprofloxacin  (CIPRO ) IVPB 400 mg  Status:  Discontinued        400 mg 200 mL/hr  over 60 Minutes Intravenous Every 12 hours 09/16/23 1954 09/17/23 1140   09/17/23 0600  metroNIDAZOLE  (FLAGYL ) IVPB 500 mg  Status:  Discontinued        500 mg 100 mL/hr over 60 Minutes Intravenous Every 12 hours 09/16/23 1954 09/18/23 0833   09/16/23 1815  ciprofloxacin  (CIPRO ) IVPB 400 mg        400 mg 200 mL/hr over 60 Minutes Intravenous  Once 09/16/23 1805 09/16/23 2013   09/16/23 1815  metroNIDAZOLE  (FLAGYL ) IVPB 500 mg        500 mg 100 mL/hr over 60 Minutes Intravenous  Once 09/16/23 1805 09/16/23 2014        Subjective: [place] Pickert no fever, no chest pain, reports no palpitations.  Were receiving amiodarone  drip patient's heart rate is well-controlled.  Hemodynamically stable and in no acute distress.  NG tube in place.  Objective: Vitals:   09/21/23 1139 09/21/23 1900 09/21/23 1940 09/21/23 2012  BP:  (!) 145/55    Pulse:  62 62   Resp:  14 (!) 22   Temp: 98 F (36.7 C)   98.4 F (36.9 C)  TempSrc: Oral  Oral  SpO2:  98% 99%   Weight:      Height:        Intake/Output Summary (Last 24 hours) at 09/21/2023 2132 Last data filed at 09/21/2023 1845 Gross per 24 hour  Intake 1321.49 ml  Output 760 ml  Net 561.49 ml   Filed Weights   09/18/23 1125 09/20/23 0100 09/20/23 0327  Weight: 84.2 kg 85.3 kg 85.3 kg   Physical Exam General exam: Alert, awake, oriented x 3; NG tube in place.  Reports mild abdominal discomfort.  No fever Respiratory system: Good saturation room air; no using accessory muscles Cardiovascular system: Rate controlled, no rubs, no gallops, no JVD. Gastrointestinal system: Abdomen is without guarding, decreased blood positive bowel sounds, left lower quadrant discomfort. Central nervous system: No focal neurological deficits. Extremities: No cyanosis or clubbing. Skin: No petechiae. Psychiatry: Judgement and insight appear normal. Mood & affect appropriate.   Data Reviewed: I have personally reviewed following labs and imaging  studies  CBC: Recent Labs  Lab 09/16/23 1245 09/17/23 0421 09/18/23 0456 09/19/23 0458 09/20/23 0015 09/21/23 0441  WBC 13.1* 23.0* 23.4* 21.5* 19.1* 15.8*  NEUTROABS 10.8*  --   --   --   --   --   HGB 10.6* 9.5* 10.4* 8.9* 9.3* 9.4*  HCT 31.9* 29.9* 31.4* 27.6* 27.8* 30.0*  MCV 94.7 98.0 96.0 96.2 96.2 97.7  PLT 278 246 251 245 263 262   Basic Metabolic Panel: Recent Labs  Lab 09/17/23 2039 09/18/23 0028 09/18/23 0456 09/18/23 0500 09/18/23 0807 09/18/23 2042 09/19/23 0123 09/19/23 0458 09/20/23 0015 09/21/23 0441  NA 128*  --  130*  --   --   --   --  133* 131* 131*  K 5.5*   < > 5.2*  --    < > 4.8 4.8 4.8 4.6 4.2  CL 102  --  104  --   --   --   --  107 105 104  CO2 15*  --  14*  --   --   --   --  16* 16* 20*  GLUCOSE 101*  --  88  --   --   --   --  83 89 85  BUN 44*  --  46*  --   --   --   --  42* 39* 33*  CREATININE 2.02*  --  1.82*  --   --   --   --  1.47* 1.41* 1.26*  CALCIUM 8.7*  --  8.9  --   --   --   --  8.5* 8.6* 8.2*  MG  --   --   --  1.7  --   --   --   --   --   --   PHOS  --   --  3.6  --   --   --   --   --   --   --    < > = values in this interval not displayed.   GFR: Estimated Creatinine Clearance: 38.9 mL/min (A) (by C-G formula based on SCr of 1.26 mg/dL (H)).  Liver Function Tests: Recent Labs  Lab 09/16/23 1245 09/18/23 0456  AST 15  --   ALT 23  --   ALKPHOS 73  --   BILITOT 0.3  --   PROT 6.7  --   ALBUMIN 2.9* 2.7*   Radiology Studies: CT GUIDED PERITONEAL/RETROPERITONEAL FLUID DRAIN BY PERC CATH Result Date: 09/21/2023 INDICATION:  78 year old female with history of diverticular abscess. EXAM: CT PERC DRAIN PERITONEAL ABCESS COMPARISON:  09/20/2023, 09/16/2023 MEDICATIONS: The patient is currently admitted to the hospital and receiving intravenous antibiotics. The antibiotics were administered within an appropriate time frame prior to the initiation of the procedure. ANESTHESIA/SEDATION: Moderate (conscious) sedation was  employed during this procedure. A total of Versed  1 mg and Fentanyl  50 mcg was administered intravenously. Moderate Sedation Time: 16 minutes. The patient's level of consciousness and vital signs were monitored continuously by radiology nursing throughout the procedure under my direct supervision. CONTRAST:  None COMPLICATIONS: None immediate. PROCEDURE: RADIATION DOSE REDUCTION: This exam was performed according to the departmental dose-optimization program which includes automated exposure control, adjustment of the mA and/or kV according to patient size and/or use of iterative reconstruction technique. Informed written consent was obtained from the patient after a discussion of the risks, benefits and alternatives to treatment. The patient was placed prone on the CT gantry and a pre procedural CT was performed re-demonstrating the known abscess/fluid collection within the mid pelvis. The procedure was planned. A timeout was performed prior to the initiation of the procedure. The right gluteal region was prepped and draped in the usual sterile fashion. The overlying soft tissues were anesthetized with 1% lidocaine  with epinephrine . Appropriate trajectory was planned with the use of a 22 gauge spinal needle. An 18 gauge trocar needle was advanced into the abscess/fluid collection and a short Amplatz super stiff wire was coiled within the collection. Appropriate positioning was confirmed with a limited CT scan. The tract was serially dilated allowing placement of a 10 [place] all-purpose drainage catheter. Appropriate positioning was confirmed with a limited postprocedural CT scan. Approximately 30 ml of purulent fluid was aspirated. The tube was connected to a bulb suction and sutured in place. A dressing was placed. The patient tolerated the procedure well without immediate post procedural complication. IMPRESSION: Successful CT guided placement of a transgluteal, 10 French all purpose drain catheter into the  pelvic fluid collection with aspiration of 30 mL of purulent fluid. Samples were sent for culture. Creasie Doctor, MD [facility] Electronically Signed   By: Creasie Doctor M.D.   On: 09/21/2023 16:07   ECHOCARDIOGRAM COMPLETE Result Date: 09/21/2023    ECHOCARDIOGRAM REPORT   Patient Name:   AYASHA ELLINGSEN Date of Exam: 09/21/2023 Medical Rec #:  [id number]       Height:       64.0 in Accession #:    [id number]      Weight:       188.1 lb Date of Birth:  1945-09-05       BSA:          1.906 m Patient Age:    78 years        BP:           146/53 mmHg Patient Gender: F               HR:           66 bpm. Exam Location:  [facility] Procedure: 2D Echo, Cardiac Doppler and Color Doppler (Both Spectral and Color            Flow Doppler were utilized during procedure). Indications:    I48.91* Unspeicified atrial fibrillation  History:        Patient has no prior history of Echocardiogram examinations.                 Arrythmias:Atrial  Fibrillation; Risk Factors:Hypertension.  Sonographer:    Andrena Bang Referring Phys: [id number] COURAGE EMOKPAE IMPRESSIONS  1. Left ventricular ejection fraction, by estimation, is 65 to 70%. The left ventricle has normal function. The left ventricle has no regional wall motion abnormalities. Left ventricular diastolic parameters are indeterminate.  2. Right ventricular systolic function is normal. The right ventricular size is normal. Tricuspid regurgitation signal is inadequate for assessing PA pressure.  3. The mitral valve is normal in structure. No evidence of mitral valve regurgitation. No evidence of mitral stenosis.  4. The aortic valve is tricuspid. Aortic valve regurgitation is not visualized. No aortic stenosis is present. FINDINGS  Left Ventricle: Left ventricular ejection fraction, by estimation, is 65 to 70%. The left ventricle has normal function. The left ventricle has no regional wall motion abnormalities. Definity   contrast agent was given IV to delineate the left ventricular  endocardial borders. The left ventricular internal cavity size was normal in size. There is no left ventricular hypertrophy. Left ventricular diastolic parameters are indeterminate. Right Ventricle: The right ventricular size is normal. Right vetricular wall thickness was not well visualized. Right ventricular systolic function is normal. Tricuspid regurgitation signal is inadequate for assessing PA pressure. Left Atrium: Left atrial size was normal in size. Right Atrium: Right atrial size was normal in size. Pericardium: There is no evidence of pericardial effusion. Mitral Valve: The mitral valve is normal in structure. No evidence of mitral valve regurgitation. No evidence of mitral valve stenosis. Tricuspid Valve: The tricuspid valve is normal in structure. Tricuspid valve regurgitation is trivial. No evidence of tricuspid stenosis. Aortic Valve: The aortic valve is tricuspid. Aortic valve regurgitation is not visualized. No aortic stenosis is present. Aortic valve mean gradient measures 4.0 mmHg. Aortic valve peak gradient measures 10.9 mmHg. Aortic valve area, by VTI measures 1.88  cm. Pulmonic Valve: The pulmonic valve was not well visualized. Pulmonic valve regurgitation is not visualized. No evidence of pulmonic stenosis. Aorta: The aortic root and ascending aorta are structurally normal, with no evidence of dilitation. Venous: The inferior vena cava was not well visualized. IAS/Shunts: The interatrial septum was not well visualized.  LEFT VENTRICLE PLAX 2D LVIDd:         4.10 cm     Diastology LVIDs:         2.30 cm     LV e' medial:    6.84 cm/s LV PW:         0.80 cm     LV E/e' medial:  7.8 LV IVS:        0.90 cm     LV e' lateral:   9.14 cm/s LVOT diam:     1.70 cm     LV E/e' lateral: 5.9 LV SV:         55 LV SV Index:   29 LVOT Area:     2.27 cm  LV Volumes (MOD) LV vol d, MOD A2C: 72.6 ml LV vol d, MOD A4C: 81.7 ml LV vol s, MOD A2C:  21.2 ml LV vol s, MOD A4C: 28.1 ml LV SV MOD A2C:     51.4 ml LV SV MOD A4C:     81.7 ml LV SV MOD BP:      53.2 ml RIGHT VENTRICLE RV S prime:     19.10 cm/s TAPSE (M-mode): 1.5 cm LEFT ATRIUM             Index LA diam:        3.00 cm 1.57  cm/m LA Vol (A2C):   48.2 ml 25.29 ml/m LA Vol (A4C):   35.8 ml 18.78 ml/m LA Biplane Vol: 41.6 ml 21.83 ml/m  AORTIC VALVE AV Area (Vmax):    1.93 cm AV Area (Vmean):   1.72 cm AV Area (VTI):     1.88 cm AV Vmax:           165.00 cm/s AV Vmean:          88.200 cm/s AV VTI:            0.293 m AV Peak Grad:      10.9 mmHg AV Mean Grad:      4.0 mmHg LVOT Vmax:         140.00 cm/s LVOT Vmean:        66.800 cm/s LVOT VTI:          0.243 m LVOT/AV VTI ratio: 0.83  AORTA Ao Root diam: 3.20 cm Ao Asc diam:  2.80 cm MITRAL VALVE MV Area (PHT): 2.65 cm    SHUNTS MV Decel Time: 286 msec    Systemic VTI:  0.24 m MV E velocity: 53.60 cm/s  Systemic Diam: 1.70 cm MV A velocity: 81.50 cm/s MV E/A ratio:  0.66 Armida Lander MD Electronically signed by Armida Lander MD Signature Date/Time: 09/21/2023/11:47:20 AM    Final    DG CHEST PORT 1 VIEW Result Date: 09/20/2023 CLINICAL DATA:  Nasogastric tube placement. EXAM: PORTABLE CHEST 1 VIEW COMPARISON:  None Available. FINDINGS: Mild cardiomegaly. Nasogastric tube tip is seen in expected position of stomach. Both lungs are clear. The visualized skeletal structures are unremarkable. IMPRESSION: Nasogastric tube tip seen in expected position of stomach. Electronically Signed   By: Rosalene Colon M.D.   On: 09/20/2023 17:47   CT ABDOMEN PELVIS WO CONTRAST Result Date: 09/20/2023 CLINICAL DATA:  Left lower quadrant abdominal pain, diverticulitis with perforation EXAM: CT ABDOMEN AND PELVIS WITHOUT CONTRAST TECHNIQUE: Multidetector CT imaging of the abdomen and pelvis was performed following the standard protocol without IV contrast. Unenhanced CT was performed per clinician order. Lack of IV contrast limits sensitivity and  specificity, especially for evaluation of abdominal/pelvic solid viscera. RADIATION DOSE REDUCTION: This exam was performed according to the departmental dose-optimization program which includes automated exposure control, adjustment of the mA and/or kV according to patient size and/or use of iterative reconstruction technique. COMPARISON:  09/16/2023 FINDINGS: Lower chest: Trace bilateral pleural effusions. No acute airspace disease. Hepatobiliary: Unremarkable unenhanced appearance of the liver. Prior cholecystectomy. Pancreas: Unremarkable unenhanced appearance. Spleen: Unremarkable unenhanced appearance. Adrenals/Urinary Tract: No urinary tract calculi or obstructive uropathy within either kidney. The adrenals and bladder are grossly unremarkable. Stomach/Bowel: The acute sigmoid diverticulitis seen previously is again identified, with enlarging gas and fluid collection adjacent to the inflamed sigmoid colon reference image 68/5, measuring 5.8 x 4.4 cm. The suspected colo enteric fistula previously identified is again noted, though slightly less pronounced on this exam, reference images 72-75 of series 5. Since the previous exam, free intraperitoneal gas is seen within the upper abdomen adjacent to the ventral margin of the liver. There is new distension of the small bowel, measuring up to 3.5 cm in diameter with multiple gas fluid levels. Transition point is seen within the pelvis adjacent to the area of inflamed sigmoid colon, consistent with small-bowel obstruction likely secondary to adhesions. Vascular/Lymphatic: Stable atherosclerosis of the aorta. No pathologic adenopathy. Reproductive: Prior hysterectomy.  No adnexal masses. Other: Small amount of free fluid within the dependent lower pelvis,  increased since prior study. As discussed above, organizing gas/fluid collection adjacent to the inflamed sigmoid colon has increased in size since prior study, now measuring 5.8 x 4.4 cm reference image 60/5. New  punctate foci of pneumoperitoneum are seen within the right upper quadrant of the abdomen consistent with perforated sigmoid diverticulitis noted previously. No abdominal wall hernia. Musculoskeletal: Stable acute to subacute compression deformity superior endplate L1 vertebral body. No new acute or destructive bony abnormalities. Reconstructed images demonstrate no additional findings. IMPRESSION: 1. Perforated sigmoid diverticulitis, with enlarged organizing gas/fluid collection in the lower pelvis now measuring up to 5.8 x 4.4 cm. New punctate foci of free intraperitoneal gas in the right upper quadrant, as well as increased free fluid within the lower pelvis. 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 2. Interval development of small bowel obstruction, with transition point within the distal small bowel adjacent to the inflamed sigmoid colon and organizing pelvic fluid collection described above. 3. Trace bilateral pleural effusions. 4.  Aortic Atherosclerosis (ICD10-I70.0). These results will be called to the ordering clinician or representative by the Radiologist Assistant, and communication documented in the PACS or [employer]. Electronically Signed   By: Bobbye Burrow M.D.   On: 09/20/2023 16:16   Scheduled Meds:  carvedilol   25 mg Oral BID   Chlorhexidine  Gluconate Cloth  6 each Topical Q0600   docusate sodium   200 mg Oral QHS   doxazosin   8 mg Oral QHS   heparin  injection (subcutaneous)  5,000 Units Subcutaneous Q8H   levothyroxine   50 mcg Oral Q0600   Continuous Infusions:  amiodarone  30 mg/hr (09/21/23 1030)   dextrose  5 % and 0.9 % NaCl 100 mL/hr at 09/21/23 2128   famotidine  (PEPCID ) IV 20 mg (09/21/23 0917)   piperacillin -tazobactam (ZOSYN )  IV 3.375 g (09/21/23 1833)    LOS: 5 days   Justina Oman M.D on 09/21/2023 at 9:32 PM  Go to [email or web address] - for contact info  [facility] - Office  [phone]  If  7PM-7AM, please contact night-coverage [email or web address] 09/21/2023, 9:32 PM

## 2023-09-21 NOTE — Progress Notes (Signed)
 I called [facility] to set up transfer time and informed them that the patient needed to be transferred as soon as possible today. [facility] aware that patient needs to be there by 1400 for procedure and stated that they should be here for pick up around noon.

## 2023-09-21 NOTE — Procedures (Signed)
 IR request for diverticular abscess drain placement.   Drain placement approved by Dr. Harrison Lin.  Patient will be transferred to [facility] for procedure today and transferred back to [facility].   Please contact IR with questions/concerns.

## 2023-09-21 NOTE — Progress Notes (Signed)
  Echocardiogram 2D Echocardiogram has been performed.  Annis Kinder, RDCS 09/21/2023, 10:38 AM

## 2023-09-21 NOTE — Procedures (Signed)
 Interventional Radiology Procedure Note  Procedure: CT guided right transgluteal pelvic abscess drain placement  Findings: Please refer to procedural dictation for full description. Approximately 30 cc purulent aspirate, sent for culture.  10 Fr pigtail drainage catheter placed via right transgluteal approach.  Complications: None immediate  Estimated Blood Loss: < 5 ml  Recommendations: Keep to bulb suction for now. IR will follow.   Creasie Doctor, MD

## 2023-09-21 NOTE — Progress Notes (Signed)
 Subjective: Patient denies any change in her abdomen.  Nausea resolved with NG tube placement.  Objective: Vital signs in last 24 hours: Temp:  [97.6 F (36.4 C)-97.9 F (36.6 C)] 97.9 F (36.6 C) (04/23 0751) Pulse Rate:  [50-68] 68 (04/23 0700) Resp:  [11-24] 16 (04/23 0600) BP: (115-157)/(42-69) 146/46 (04/23 0700) SpO2:  [96 %-99 %] 98 % (04/23 0700) Last BM Date : 09/15/23  Intake/Output from previous day: 04/22 0701 - 04/23 0700 In: 2571.9 [P.O.:300; I.V.:2130.7; IV Piggyback:141.2] Out: 1385 [Emesis/NG output:1385] Intake/Output this shift: No intake/output data recorded.  General appearance: alert, cooperative, and no distress GI: Soft with minimal bowel sounds appreciated.  No rigidity noted.  Tender in the lower abdomen.  Lab Results:  Recent Labs    09/20/23 0015 09/21/23 0441  WBC 19.1* 15.8*  HGB 9.3* 9.4*  HCT 27.8* 30.0*  PLT 263 262   BMET Recent Labs    09/20/23 0015 09/21/23 0441  NA 131* 131*  K 4.6 4.2  CL 105 104  CO2 16* 20*  GLUCOSE 89 85  BUN 39* 33*  CREATININE 1.41* 1.26*  CALCIUM 8.6* 8.2*   PT/INR Recent Labs    09/21/23 0611  LABPROT 14.6  INR 1.1    Studies/Results: DG CHEST PORT 1 VIEW Result Date: 09/20/2023 CLINICAL DATA:  Nasogastric tube placement. EXAM: PORTABLE CHEST 1 VIEW COMPARISON:  None Available. FINDINGS: Mild cardiomegaly. Nasogastric tube tip is seen in expected position of stomach. Both lungs are clear. The visualized skeletal structures are unremarkable. IMPRESSION: Nasogastric tube tip seen in expected position of stomach. Electronically Signed   By: Rosalene Colon M.D.   On: 09/20/2023 17:47   CT ABDOMEN PELVIS WO CONTRAST Result Date: 09/20/2023 CLINICAL DATA:  Left lower quadrant abdominal pain, diverticulitis with perforation EXAM: CT ABDOMEN AND PELVIS WITHOUT CONTRAST TECHNIQUE: Multidetector CT imaging of the abdomen and pelvis was performed following the standard protocol without IV contrast.  Unenhanced CT was performed per clinician order. Lack of IV contrast limits sensitivity and specificity, especially for evaluation of abdominal/pelvic solid viscera. RADIATION DOSE REDUCTION: This exam was performed according to the departmental dose-optimization program which includes automated exposure control, adjustment of the mA and/or kV according to patient size and/or use of iterative reconstruction technique. COMPARISON:  09/16/2023 FINDINGS: Lower chest: Trace bilateral pleural effusions. No acute airspace disease. Hepatobiliary: Unremarkable unenhanced appearance of the liver. Prior cholecystectomy. Pancreas: Unremarkable unenhanced appearance. Spleen: Unremarkable unenhanced appearance. Adrenals/Urinary Tract: No urinary tract calculi or obstructive uropathy within either kidney. The adrenals and bladder are grossly unremarkable. Stomach/Bowel: The acute sigmoid diverticulitis seen previously is again identified, with enlarging gas and fluid collection adjacent to the inflamed sigmoid colon reference image 68/5, measuring 5.8 x 4.4 cm. The suspected colo enteric fistula previously identified is again noted, though slightly less pronounced on this exam, reference images 72-75 of series 5. Since the previous exam, free intraperitoneal gas is seen within the upper abdomen adjacent to the ventral margin of the liver. There is new distension of the small bowel, measuring up to 3.5 cm in diameter with multiple gas fluid levels. Transition point is seen within the pelvis adjacent to the area of inflamed sigmoid colon, consistent with small-bowel obstruction likely secondary to adhesions. Vascular/Lymphatic: Stable atherosclerosis of the aorta. No pathologic adenopathy. Reproductive: Prior hysterectomy.  No adnexal masses. Other: Small amount of free fluid within the dependent lower pelvis, increased since prior study. As discussed above, organizing gas/fluid collection adjacent to the inflamed sigmoid colon has  increased in size since prior study, now measuring 5.8 x 4.4 cm reference image 60/5. New punctate foci of pneumoperitoneum are seen within the right upper quadrant of the abdomen consistent with perforated sigmoid diverticulitis noted previously. No abdominal wall hernia. Musculoskeletal: Stable acute to subacute compression deformity superior endplate L1 vertebral body. No new acute or destructive bony abnormalities. Reconstructed images demonstrate no additional findings. IMPRESSION: 1. Perforated sigmoid diverticulitis, with enlarged organizing gas/fluid collection in the lower pelvis now measuring up to 5.8 x 4.4 cm. New punctate foci of free intraperitoneal gas in the right upper quadrant, as well as increased free fluid within the lower pelvis. 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 2. Interval development of small bowel obstruction, with transition point within the distal small bowel adjacent to the inflamed sigmoid colon and organizing pelvic fluid collection described above. 3. Trace bilateral pleural effusions. 4.  Aortic Atherosclerosis (ICD10-I70.0). These results will be called to the ordering clinician or representative by the Radiologist Assistant, and communication documented in the PACS or [employer]. Electronically Signed   By: Bobbye Burrow M.D.   On: 09/20/2023 16:16    Anti-infectives: Anti-infectives (From admission, onward)    Start     Dose/Rate Route Frequency Ordered Stop   09/19/23 2200  piperacillin -tazobactam (ZOSYN ) IVPB 3.375 g        3.375 g 12.5 mL/hr over 240 Minutes Intravenous Every 8 hours 09/19/23 1045     09/18/23 1030  meropenem  (MERREM ) 1 g in sodium chloride  0.9 % 100 mL IVPB  Status:  Discontinued        1 g 200 mL/hr over 30 Minutes Intravenous Every 12 hours 09/18/23 0934 09/19/23 1045   09/18/23 1000  ciprofloxacin  (CIPRO ) IVPB 400 mg  Status:  Discontinued        400 mg 200 mL/hr over 60  Minutes Intravenous Every 24 hours 09/17/23 1140 09/18/23 0833   09/17/23 0600  ciprofloxacin  (CIPRO ) IVPB 400 mg  Status:  Discontinued        400 mg 200 mL/hr over 60 Minutes Intravenous Every 12 hours 09/16/23 1954 09/17/23 1140   09/17/23 0600  metroNIDAZOLE  (FLAGYL ) IVPB 500 mg  Status:  Discontinued        500 mg 100 mL/hr over 60 Minutes Intravenous Every 12 hours 09/16/23 1954 09/18/23 0833   09/16/23 1815  ciprofloxacin  (CIPRO ) IVPB 400 mg        400 mg 200 mL/hr over 60 Minutes Intravenous  Once 09/16/23 1805 09/16/23 2013   09/16/23 1815  metroNIDAZOLE  (FLAGYL ) IVPB 500 mg        500 mg 100 mL/hr over 60 Minutes Intravenous  Once 09/16/23 1805 09/16/23 2014       Assessment/Plan: Impression: Sigmoid diverticulitis with coloenteric fistula, pelvic abscess.  No peritoneal signs noted at the present time.  Leukocytosis improving. Plan: For IR percutaneous drainage of pelvic abscess today.  Situation explained to patient and friend.  LOS: 5 days    Felicia Frank 09/21/2023

## 2023-09-22 DIAGNOSIS — N1831 Chronic kidney disease, stage 3a: Secondary | ICD-10-CM | POA: Diagnosis not present

## 2023-09-22 DIAGNOSIS — K5792 Diverticulitis of intestine, part unspecified, without perforation or abscess without bleeding: Secondary | ICD-10-CM | POA: Diagnosis not present

## 2023-09-22 DIAGNOSIS — E039 Hypothyroidism, unspecified: Secondary | ICD-10-CM | POA: Diagnosis not present

## 2023-09-22 DIAGNOSIS — K632 Fistula of intestine: Secondary | ICD-10-CM | POA: Diagnosis not present

## 2023-09-22 LAB — CBC
HCT: 29.1 % — ABNORMAL LOW (ref 36.0–46.0)
Hemoglobin: 9.5 g/dL — ABNORMAL LOW (ref 12.0–15.0)
MCH: 31.1 pg (ref 26.0–34.0)
MCHC: 32.6 g/dL (ref 30.0–36.0)
MCV: 95.4 fL (ref 80.0–100.0)
Platelets: 247 10*3/uL (ref 150–400)
RBC: 3.05 MIL/uL — ABNORMAL LOW (ref 3.87–5.11)
RDW: 14.6 % (ref 11.5–15.5)
WBC: 14.8 10*3/uL — ABNORMAL HIGH (ref 4.0–10.5)
nRBC: 0 % (ref 0.0–0.2)

## 2023-09-22 LAB — GLUCOSE, CAPILLARY
Glucose-Capillary: 127 mg/dL — ABNORMAL HIGH (ref 70–99)
Glucose-Capillary: 131 mg/dL — ABNORMAL HIGH (ref 70–99)
Glucose-Capillary: 131 mg/dL — ABNORMAL HIGH (ref 70–99)
Glucose-Capillary: 136 mg/dL — ABNORMAL HIGH (ref 70–99)

## 2023-09-22 LAB — BASIC METABOLIC PANEL WITH GFR
Anion gap: 7 (ref 5–15)
BUN: 23 mg/dL (ref 8–23)
CO2: 20 mmol/L — ABNORMAL LOW (ref 22–32)
Calcium: 7.8 mg/dL — ABNORMAL LOW (ref 8.9–10.3)
Chloride: 104 mmol/L (ref 98–111)
Creatinine, Ser: 1.11 mg/dL — ABNORMAL HIGH (ref 0.44–1.00)
GFR, Estimated: 51 mL/min — ABNORMAL LOW (ref >60)
Glucose, Bld: 137 mg/dL — ABNORMAL HIGH (ref 70–99)
Potassium: 3.8 mmol/L (ref 3.5–5.1)
Sodium: 131 mmol/L — ABNORMAL LOW (ref 135–145)

## 2023-09-22 LAB — MAGNESIUM: Magnesium: 1.4 mg/dL — ABNORMAL LOW (ref 1.7–2.4)

## 2023-09-22 MED ORDER — METOPROLOL TARTRATE 5 MG/5ML IV SOLN
5.0000 mg | Freq: Four times a day (QID) | INTRAVENOUS | Status: DC
  Administered 2023-09-22 – 2023-09-24 (×11): 5 mg via INTRAVENOUS
  Filled 2023-09-22 (×12): qty 5

## 2023-09-22 MED ORDER — SODIUM CHLORIDE 0.9% FLUSH
5.0000 mL | Freq: Three times a day (TID) | INTRAVENOUS | Status: DC
  Administered 2023-09-22 – 2023-09-28 (×17): 5 mL

## 2023-09-22 MED ORDER — DILTIAZEM HCL 25 MG/5ML IV SOLN
10.0000 mg | Freq: Once | INTRAVENOUS | Status: AC
  Administered 2023-09-22: 10 mg via INTRAVENOUS
  Filled 2023-09-22: qty 5

## 2023-09-22 MED ORDER — METOPROLOL TARTRATE 5 MG/5ML IV SOLN
5.0000 mg | Freq: Once | INTRAVENOUS | Status: AC
  Administered 2023-09-22: 5 mg via INTRAVENOUS
  Filled 2023-09-22: qty 5

## 2023-09-22 NOTE — Plan of Care (Signed)
  Problem: Education: Goal: Knowledge of General Education information will improve Description: Including pain rating scale, medication(s)/side effects and non-pharmacologic comfort measures Outcome: Progressing   Problem: Health Behavior/Discharge Planning: Goal: Ability to manage health-related needs will improve Outcome: Progressing   Problem: Clinical Measurements: Goal: Ability to maintain clinical measurements within normal limits will improve Outcome: Progressing Goal: Diagnostic test results will improve Outcome: Progressing Goal: Respiratory complications will improve Outcome: Progressing Goal: Cardiovascular complication will be avoided Outcome: Progressing   Problem: Activity: Goal: Risk for activity intolerance will decrease Outcome: Progressing   Problem: Nutrition: Goal: Adequate nutrition will be maintained Outcome: Progressing   Problem: Coping: Goal: Level of anxiety will decrease Outcome: Progressing   Problem: Elimination: Goal: Will not experience complications related to bowel motility Outcome: Progressing Goal: Will not experience complications related to urinary retention Outcome: Progressing   Problem: Pain Managment: Goal: General experience of comfort will improve and/or be controlled Outcome: Progressing   Problem: Safety: Goal: Ability to remain free from injury will improve Outcome: Progressing   Problem: Skin Integrity: Goal: Risk for impaired skin integrity will decrease Outcome: Progressing

## 2023-09-22 NOTE — Progress Notes (Signed)
 Subjective: Patient does feel better.  She has less abdominal pain.  She is passing gas.  Objective: Vital signs in last 24 hours: Temp:  [97.5 F (36.4 C)-98.4 F (36.9 C)] 97.5 F (36.4 C) (04/24 0400) Pulse Rate:  [43-142] 85 (04/24 0630) Resp:  [11-28] 22 (04/24 0630) BP: (89-158)/(49-110) 89/66 (04/24 0630) SpO2:  [93 %-100 %] 99 % (04/24 0630) Last BM Date : 09/15/23  Intake/Output from previous day: 04/23 0701 - 04/24 0700 In: 1511.5 [I.V.:1314.3; IV Piggyback:197.1] Out: 1140 [Urine:800; Emesis/NG output:330; Drains:10] Intake/Output this shift: No intake/output data recorded.  General appearance: alert, cooperative, and no distress GI: Soft with less tenderness noted in the lower abdomen.  Occasional bowel sounds appreciated.  JP drainage with a small amount of purulent drainage present.  Lab Results:  Recent Labs    09/21/23 0441 09/22/23 0646  WBC 15.8* 14.8*  HGB 9.4* 9.5*  HCT 30.0* 29.1*  PLT 262 247   BMET Recent Labs    09/20/23 0015 09/21/23 0441  NA 131* 131*  K 4.6 4.2  CL 105 104  CO2 16* 20*  GLUCOSE 89 85  BUN 39* 33*  CREATININE 1.41* 1.26*  CALCIUM 8.6* 8.2*   PT/INR Recent Labs    09/21/23 0611  LABPROT 14.6  INR 1.1    Studies/Results: CT GUIDED PERITONEAL/RETROPERITONEAL FLUID DRAIN BY PERC CATH Result Date: 09/21/2023 INDICATION: 78 year old female with history of diverticular abscess. EXAM: CT PERC DRAIN PERITONEAL ABCESS COMPARISON:  09/20/2023, 09/16/2023 MEDICATIONS: The patient is currently admitted to the hospital and receiving intravenous antibiotics. The antibiotics were administered within an appropriate time frame prior to the initiation of the procedure. ANESTHESIA/SEDATION: Moderate (conscious) sedation was employed during this procedure. A total of Versed  1 mg and Fentanyl  50 mcg was administered intravenously. Moderate Sedation Time: 16 minutes. The patient's level of consciousness and vital signs were monitored  continuously by radiology nursing throughout the procedure under my direct supervision. CONTRAST:  None COMPLICATIONS: None immediate. PROCEDURE: RADIATION DOSE REDUCTION: This exam was performed according to the departmental dose-optimization program which includes automated exposure control, adjustment of the mA and/or kV according to patient size and/or use of iterative reconstruction technique. Informed written consent was obtained from the patient after a discussion of the risks, benefits and alternatives to treatment. The patient was placed prone on the CT gantry and a pre procedural CT was performed re-demonstrating the known abscess/fluid collection within the mid pelvis. The procedure was planned. A timeout was performed prior to the initiation of the procedure. The right gluteal region was prepped and draped in the usual sterile fashion. The overlying soft tissues were anesthetized with 1% lidocaine  with epinephrine . Appropriate trajectory was planned with the use of a 22 gauge spinal needle. An 18 gauge trocar needle was advanced into the abscess/fluid collection and a short Amplatz super stiff wire was coiled within the collection. Appropriate positioning was confirmed with a limited CT scan. The tract was serially dilated allowing placement of a 10 [place] all-purpose drainage catheter. Appropriate positioning was confirmed with a limited postprocedural CT scan. Approximately 30 ml of purulent fluid was aspirated. The tube was connected to a bulb suction and sutured in place. A dressing was placed. The patient tolerated the procedure well without immediate post procedural complication. IMPRESSION: Successful CT guided placement of a transgluteal, 10 French all purpose drain catheter into the pelvic fluid collection with aspiration of 30 mL of purulent fluid. Samples were sent for culture. Creasie Doctor, MD Vascular and Interventional Radiology  [facility] Radiology Electronically Signed   By:  Creasie Doctor M.D.   On: 09/21/2023 16:07   ECHOCARDIOGRAM COMPLETE Result Date: 09/21/2023    ECHOCARDIOGRAM REPORT   Patient Name:   Felicia Frank Date of Exam: 09/21/2023 Medical Rec #:  [id number]       Height:       64.0 in Accession #:    [id number]      Weight:       188.1 lb Date of Birth:  08/13/1945       BSA:          1.906 m Patient Age:    78 years        BP:           146/53 mmHg Patient Gender: F               HR:           66 bpm. Exam Location:  [facility] Procedure: 2D Echo, Cardiac Doppler and Color Doppler (Both Spectral and Color            Flow Doppler were utilized during procedure). Indications:    I48.91* Unspeicified atrial fibrillation  History:        Patient has no prior history of Echocardiogram examinations.                 Arrythmias:Atrial Fibrillation; Risk Factors:Hypertension.  Sonographer:    Andrena Bang Referring Phys: [id number] COURAGE EMOKPAE IMPRESSIONS  1. Left ventricular ejection fraction, by estimation, is 65 to 70%. The left ventricle has normal function. The left ventricle has no regional wall motion abnormalities. Left ventricular diastolic parameters are indeterminate.  2. Right ventricular systolic function is normal. The right ventricular size is normal. Tricuspid regurgitation signal is inadequate for assessing PA pressure.  3. The mitral valve is normal in structure. No evidence of mitral valve regurgitation. No evidence of mitral stenosis.  4. The aortic valve is tricuspid. Aortic valve regurgitation is not visualized. No aortic stenosis is present. FINDINGS  Left Ventricle: Left ventricular ejection fraction, by estimation, is 65 to 70%. The left ventricle has normal function. The left ventricle has no regional wall motion abnormalities. Definity  contrast agent was given IV to delineate the left ventricular  endocardial borders. The left ventricular internal cavity size was normal in size. There is no left ventricular hypertrophy. Left ventricular diastolic  parameters are indeterminate. Right Ventricle: The right ventricular size is normal. Right vetricular wall thickness was not well visualized. Right ventricular systolic function is normal. Tricuspid regurgitation signal is inadequate for assessing PA pressure. Left Atrium: Left atrial size was normal in size. Right Atrium: Right atrial size was normal in size. Pericardium: There is no evidence of pericardial effusion. Mitral Valve: The mitral valve is normal in structure. No evidence of mitral valve regurgitation. No evidence of mitral valve stenosis. Tricuspid Valve: The tricuspid valve is normal in structure. Tricuspid valve regurgitation is trivial. No evidence of tricuspid stenosis. Aortic Valve: The aortic valve is tricuspid. Aortic valve regurgitation is not visualized. No aortic stenosis is present. Aortic valve mean gradient measures 4.0 mmHg. Aortic valve peak gradient measures 10.9 mmHg. Aortic valve area, by VTI measures 1.88  cm. Pulmonic Valve: The pulmonic valve was not well visualized. Pulmonic valve regurgitation is not visualized. No evidence of pulmonic stenosis. Aorta: The aortic root and ascending aorta are structurally normal, with no evidence of dilitation. Venous: The inferior vena cava was not well visualized. IAS/Shunts: The interatrial septum  was not well visualized.  LEFT VENTRICLE PLAX 2D LVIDd:         4.10 cm     Diastology LVIDs:         2.30 cm     LV e' medial:    6.84 cm/s LV PW:         0.80 cm     LV E/e' medial:  7.8 LV IVS:        0.90 cm     LV e' lateral:   9.14 cm/s LVOT diam:     1.70 cm     LV E/e' lateral: 5.9 LV SV:         55 LV SV Index:   29 LVOT Area:     2.27 cm  LV Volumes (MOD) LV vol d, MOD A2C: 72.6 ml LV vol d, MOD A4C: 81.7 ml LV vol s, MOD A2C: 21.2 ml LV vol s, MOD A4C: 28.1 ml LV SV MOD A2C:     51.4 ml LV SV MOD A4C:     81.7 ml LV SV MOD BP:      53.2 ml RIGHT VENTRICLE RV S prime:     19.10 cm/s TAPSE (M-mode): 1.5 cm LEFT ATRIUM             Index LA  diam:        3.00 cm 1.57 cm/m LA Vol (A2C):   48.2 ml 25.29 ml/m LA Vol (A4C):   35.8 ml 18.78 ml/m LA Biplane Vol: 41.6 ml 21.83 ml/m  AORTIC VALVE AV Area (Vmax):    1.93 cm AV Area (Vmean):   1.72 cm AV Area (VTI):     1.88 cm AV Vmax:           165.00 cm/s AV Vmean:          88.200 cm/s AV VTI:            0.293 m AV Peak Grad:      10.9 mmHg AV Mean Grad:      4.0 mmHg LVOT Vmax:         140.00 cm/s LVOT Vmean:        66.800 cm/s LVOT VTI:          0.243 m LVOT/AV VTI ratio: 0.83  AORTA Ao Root diam: 3.20 cm Ao Asc diam:  2.80 cm MITRAL VALVE MV Area (PHT): 2.65 cm    SHUNTS MV Decel Time: 286 msec    Systemic VTI:  0.24 m MV E velocity: 53.60 cm/s  Systemic Diam: 1.70 cm MV A velocity: 81.50 cm/s MV E/A ratio:  0.66 Armida Lander MD Electronically signed by Armida Lander MD Signature Date/Time: 09/21/2023/11:47:20 AM    Final    DG CHEST PORT 1 VIEW Result Date: 09/20/2023 CLINICAL DATA:  Nasogastric tube placement. EXAM: PORTABLE CHEST 1 VIEW COMPARISON:  None Available. FINDINGS: Mild cardiomegaly. Nasogastric tube tip is seen in expected position of stomach. Both lungs are clear. The visualized skeletal structures are unremarkable. IMPRESSION: Nasogastric tube tip seen in expected position of stomach. Electronically Signed   By: Rosalene Colon M.D.   On: 09/20/2023 17:47   CT ABDOMEN PELVIS WO CONTRAST Result Date: 09/20/2023 CLINICAL DATA:  Left lower quadrant abdominal pain, diverticulitis with perforation EXAM: CT ABDOMEN AND PELVIS WITHOUT CONTRAST TECHNIQUE: Multidetector CT imaging of the abdomen and pelvis was performed following the standard protocol without IV contrast. Unenhanced CT was performed per clinician order. Lack of IV contrast limits sensitivity  and specificity, especially for evaluation of abdominal/pelvic solid viscera. RADIATION DOSE REDUCTION: This exam was performed according to the departmental dose-optimization program which includes automated exposure control,  adjustment of the mA and/or kV according to patient size and/or use of iterative reconstruction technique. COMPARISON:  09/16/2023 FINDINGS: Lower chest: Trace bilateral pleural effusions. No acute airspace disease. Hepatobiliary: Unremarkable unenhanced appearance of the liver. Prior cholecystectomy. Pancreas: Unremarkable unenhanced appearance. Spleen: Unremarkable unenhanced appearance. Adrenals/Urinary Tract: No urinary tract calculi or obstructive uropathy within either kidney. The adrenals and bladder are grossly unremarkable. Stomach/Bowel: The acute sigmoid diverticulitis seen previously is again identified, with enlarging gas and fluid collection adjacent to the inflamed sigmoid colon reference image 68/5, measuring 5.8 x 4.4 cm. The suspected colo enteric fistula previously identified is again noted, though slightly less pronounced on this exam, reference images 72-75 of series 5. Since the previous exam, free intraperitoneal gas is seen within the upper abdomen adjacent to the ventral margin of the liver. There is new distension of the small bowel, measuring up to 3.5 cm in diameter with multiple gas fluid levels. Transition point is seen within the pelvis adjacent to the area of inflamed sigmoid colon, consistent with small-bowel obstruction likely secondary to adhesions. Vascular/Lymphatic: Stable atherosclerosis of the aorta. No pathologic adenopathy. Reproductive: Prior hysterectomy.  No adnexal masses. Other: Small amount of free fluid within the dependent lower pelvis, increased since prior study. As discussed above, organizing gas/fluid collection adjacent to the inflamed sigmoid colon has increased in size since prior study, now measuring 5.8 x 4.4 cm reference image 60/5. New punctate foci of pneumoperitoneum are seen within the right upper quadrant of the abdomen consistent with perforated sigmoid diverticulitis noted previously. No abdominal wall hernia. Musculoskeletal: Stable acute to  subacute compression deformity superior endplate L1 vertebral body. No new acute or destructive bony abnormalities. Reconstructed images demonstrate no additional findings. IMPRESSION: 1. Perforated sigmoid diverticulitis, with enlarged organizing gas/fluid collection in the lower pelvis now measuring up to 5.8 x 4.4 cm. New punctate foci of free intraperitoneal gas in the right upper quadrant, as well as increased free fluid within the lower pelvis. 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 2. Interval development of small bowel obstruction, with transition point within the distal small bowel adjacent to the inflamed sigmoid colon and organizing pelvic fluid collection described above. 3. Trace bilateral pleural effusions. 4.  Aortic Atherosclerosis (ICD10-I70.0). These results will be called to the ordering clinician or representative by the Radiologist Assistant, and communication documented in the PACS or [employer]. Electronically Signed   By: Bobbye Burrow M.D.   On: 09/20/2023 16:16    Anti-infectives: Anti-infectives (From admission, onward)    Start     Dose/Rate Route Frequency Ordered Stop   09/19/23 2200  piperacillin -tazobactam (ZOSYN ) IVPB 3.375 g        3.375 g 12.5 mL/hr over 240 Minutes Intravenous Every 8 hours 09/19/23 1045     09/18/23 1030  meropenem  (MERREM ) 1 g in sodium chloride  0.9 % 100 mL IVPB  Status:  Discontinued        1 g 200 mL/hr over 30 Minutes Intravenous Every 12 hours 09/18/23 0934 09/19/23 1045   09/18/23 1000  ciprofloxacin  (CIPRO ) IVPB 400 mg  Status:  Discontinued        400 mg 200 mL/hr over 60 Minutes Intravenous Every 24 hours 09/17/23 1140 09/18/23 0833   09/17/23 0600  ciprofloxacin  (CIPRO ) IVPB 400 mg  Status:  Discontinued        400 mg 200 mL/hr over 60 Minutes Intravenous Every 12 hours 09/16/23 1954 09/17/23 1140   09/17/23 0600  metroNIDAZOLE  (FLAGYL ) IVPB 500 mg  Status:   Discontinued        500 mg 100 mL/hr over 60 Minutes Intravenous Every 12 hours 09/16/23 1954 09/18/23 0833   09/16/23 1815  ciprofloxacin  (CIPRO ) IVPB 400 mg        400 mg 200 mL/hr over 60 Minutes Intravenous  Once 09/16/23 1805 09/16/23 2013   09/16/23 1815  metroNIDAZOLE  (FLAGYL ) IVPB 500 mg        500 mg 100 mL/hr over 60 Minutes Intravenous  Once 09/16/23 1805 09/16/23 2014       Assessment/Plan: Impression: Sigmoid diverticulitis with coloenteric fistula and pelvic abscess which has been drained by IR.  Patient overall improved.  Leukocytosis continues to trend downward. Plan: Continue NG tube decompression for now.  If she starts having a bowel movement or her NG tube output decreases, may pull it.  Hopefully we can avoid surgery during this admission.  Patient understands.  LOS: 6 days    [name] 09/22/2023

## 2023-09-22 NOTE — Progress Notes (Signed)
 PROGRESS NOTE  Felicia Frank, is a 78 y.o. female, DOB - 1946/03/09, RUE:[id number]  Admit date - 09/16/2023   Admitting Physician Twilla Galea, DO  Outpatient Primary MD for the patient is [name], [name], MD  LOS - 6  Chief Complaint  Patient presents with   Fall      Brief Narrative:  79 y.o. 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 as well as superior endplate compression fracture of L1   -Assessment and Plan: 1)Acute Sigmoid Diverticulitis with coloenteric fistula--- -- discussed with general surgeon who advises nonoperative approach at this time -WBC 13.1 >>23.0>>> 23.4>>21.5>>19.1>> 14.8 -Stopped Cipro  and Flagyl  on 09/18/23  -Continue Zosyn  for now - Repeat CT Abdomen on 09/20/2023 with perforated sigmoid diverticulitis and organizing pelvic area fluid/gas collection measuring 6 x 4.4 CM--with coloenteric fistula and small bowel obstruction with transition point within the distal small bowel adjacent to the inflamed sigmoid colon  --Discussed with general surgeon Dr. Larrie Po; will ocntinue following rec's. -IR consultation appreciated; s/p drain placement without complications. -c/n  As needed Dilaudid  and oxycodone  for pain control -c/n  As needed Zofran  for nausea and vomiting - Follow clinical response.  2)AKI----acute kidney injury on CKD stage - 3A - Creatinine on admission= 1.46, baseline creatinine = 1.3 (on 06/07/23) per Care Everywhere , -Creatinine peaked at 2.05 this admission -Creatinine trended down with hydration--currently 1.26 and is stable -Continue to renally adjust medications, avoid nephrotoxic agents / dehydration  / hypotension - Maintain adequate hydration.  3) paroxysmal atrial fibrillation--new this admission- - Went back into A-fib with RVR overnight  on 09/20/23 --required IV cardizem  and V amiodarone  - Continue Coreg  and IV amiodarone . -Echo requested - TSH within normal limits. - General Surgery request that wel hold off on full anticoagulation for now, in case she ended needing any surgical intervention.  4)HypoNatremia--due to dehydration, emesis with GI losses/NG tube and poor oral intake -Sodium improving with hydration - c/n IV fluids as ordered and follow electrolytes trend.  5)HTN- -stable vital - Continue treatment with Cardura  and carvedilol .  6)Hypothyroidism-- -continue treatment with Synthroid .  7)Hyperkalemia--in the setting of worsening renal function/AKI on CKD   -Potassium normalized with Lokelma   - Continue to follow electrolytes trend.  8) chronic anemia--Hgb trended down with hemodilution - Baseline hemoglobin usually around 10; currently 9.5 - No bleeding concerns appreciated  9)L1 Compression Fx---Superior endplate compression fracture of L1 is new since MRI 06/22/2023. There is 15 percent vertebral body height loss centrally. 2 mm of retropulsion of the superior endplate. - As needed pain medication - TLSO brace advised - Outpatient follow-up with spine specialist  Status is: Inpatient  Disposition: The patient is from: Home              Anticipated d/c is to: Home              Anticipated d/c date is: > 3 days              Patient currently is not medically stable to d/c.  Barriers: Not Clinically Stable-   Code Status :  -  Code Status: Full Code   Family Communication:   Daughter at bedside.  CRITICAL CARE Performed by: Justina Oman   Total critical care time: 45 minutes  Critical care time was exclusive of separately billable procedures and treating  other patients.  Critical care was necessary to treat or prevent imminent or life-threatening deterioration.  Critical care was time spent personally by me on the following activities: development of treatment plan with patient and/or  surrogate as well as nursing, discussions with consultants, evaluation of patient's response to treatment, examination of patient, obtaining history from patient or surrogate, ordering and performing treatments and interventions, ordering and review of laboratory studies, ordering and review of radiographic studies, pulse oximetry and re-evaluation of patient's condition. Care    DVT Prophylaxis  :   - SCDs   heparin  injection 5,000 Units Start: 09/21/23 2200 Place and maintain sequential compression device Start: 09/20/23 1659 Place TED hose Start: 09/20/23 1659   Lab Results  Component Value Date   PLT 247 09/22/2023   Inpatient Medications  Scheduled Meds:  Chlorhexidine  Gluconate Cloth  6 each Topical Q0600   docusate sodium   200 mg Oral QHS   doxazosin   8 mg Oral QHS   heparin  injection (subcutaneous)  5,000 Units Subcutaneous Q8H   levothyroxine   50 mcg Oral Q0600   metoprolol  tartrate  5 mg Intravenous Q6H   Continuous Infusions:  amiodarone  Stopped (09/22/23 1844)   dextrose  5 % and 0.9 % NaCl Stopped (09/22/23 1844)   famotidine  (PEPCID ) IV 20 mg (09/22/23 0840)   piperacillin -tazobactam (ZOSYN )  IV 3.375 g (09/22/23 0958)   PRN Meds:.HYDROmorphone  (DILAUDID ) injection, labetalol , ondansetron  (ZOFRAN ) IV, mouth rinse, oxyCODONE , zolpidem    Anti-infectives (From admission, onward)    Start     Dose/Rate Route Frequency Ordered Stop   09/19/23 2200  piperacillin -tazobactam (ZOSYN ) IVPB 3.375 g        3.375 g 12.5 mL/hr over 240 Minutes Intravenous Every 8 hours 09/19/23 1045     09/18/23 1030  meropenem  (MERREM ) 1 g in sodium chloride  0.9 % 100 mL IVPB  Status:  Discontinued        1 g 200 mL/hr over 30 Minutes Intravenous Every 12 hours 09/18/23 0934 09/19/23 1045   09/18/23 1000  ciprofloxacin  (CIPRO ) IVPB 400 mg  Status:  Discontinued        400 mg 200 mL/hr over 60 Minutes Intravenous Every 24 hours 09/17/23 1140 09/18/23 0833   09/17/23 0600  ciprofloxacin   (CIPRO ) IVPB 400 mg  Status:  Discontinued        400 mg 200 mL/hr over 60 Minutes Intravenous Every 12 hours 09/16/23 1954 09/17/23 1140   09/17/23 0600  metroNIDAZOLE  (FLAGYL ) IVPB 500 mg  Status:  Discontinued        500 mg 100 mL/hr over 60 Minutes Intravenous Every 12 hours 09/16/23 1954 09/18/23 0833   09/16/23 1815  ciprofloxacin  (CIPRO ) IVPB 400 mg        400 mg 200 mL/hr over 60 Minutes Intravenous  Once 09/16/23 1805 09/16/23 2013   09/16/23 1815  metroNIDAZOLE  (FLAGYL ) IVPB 500 mg        500 mg 100 mL/hr over 60 Minutes Intravenous  Once 09/16/23 1805 09/16/23 2014        Subjective: Felicia Frank overnight with transient A. Fib with RVR when unable to provide oral b-blocker; resolved after starting IV metoprolol . No fever no nausea and passing gas, still no BM. Tolerated well drainage placement.   Objective: Vitals:   09/22/23 1700 09/22/23 1800 09/22/23 1818 09/22/23 1900  BP: (!) 112/54 (!) 132/51  (!) 117/53  Pulse: (!) 59 61 60 61  Resp: 12 18 19 18   Temp:      TempSrc:  SpO2: 99% 99% 99% 98%  Weight:      Height:        Intake/Output Summary (Last 24 hours) at 09/22/2023 1923 Last data filed at 09/22/2023 1800 Gross per 24 hour  Intake 1511.47 ml  Output 1935 ml  Net -423.53 ml   Filed Weights   09/18/23 1125 09/20/23 0100 09/20/23 0327  Weight: 84.2 kg 85.3 kg 85.3 kg   Physical Exam General exam: Alert, awake, oriented x 3; NG tube in place, reports improvement in abd pain. Respiratory system:  Respiratory effort normal. Good sat on RA. Cardiovascular system:RRR. No rubs or gallops. Gastrointestinal system: Abdomen is mildly tender to palpation and demonstrate positive BS. Central nervous system: No focal neurological deficits. Extremities: No cyanosis or clubbing. Skin: No petechiae. Psychiatry: Judgement and insight appear normal. Flat affect.  Data Reviewed: I have personally reviewed following labs and imaging studies  CBC: Recent  Labs  Lab 09/16/23 1245 09/17/23 0421 09/18/23 0456 09/19/23 0458 09/20/23 0015 09/21/23 0441 09/22/23 0646  WBC 13.1*   < > 23.4* 21.5* 19.1* 15.8* 14.8*  NEUTROABS 10.8*  --   --   --   --   --   --   HGB 10.6*   < > 10.4* 8.9* 9.3* 9.4* 9.5*  HCT 31.9*   < > 31.4* 27.6* 27.8* 30.0* 29.1*  MCV 94.7   < > 96.0 96.2 96.2 97.7 95.4  PLT 278   < > 251 245 263 262 247   < > = values in this interval not displayed.   Basic Metabolic Panel: Recent Labs  Lab 09/18/23 0456 09/18/23 0500 09/18/23 0807 09/19/23 0123 09/19/23 0458 09/20/23 0015 09/21/23 0441 09/22/23 0646  NA 130*  --   --   --  133* 131* 131* 131*  K 5.2*  --    < > 4.8 4.8 4.6 4.2 3.8  CL 104  --   --   --  107 105 104 104  CO2 14*  --   --   --  16* 16* 20* 20*  GLUCOSE 88  --   --   --  83 89 85 137*  BUN 46*  --   --   --  42* 39* 33* 23  CREATININE 1.82*  --   --   --  1.47* 1.41* 1.26* 1.11*  CALCIUM 8.9  --   --   --  8.5* 8.6* 8.2* 7.8*  MG  --  1.7  --   --   --   --   --  1.4*  PHOS 3.6  --   --   --   --   --   --   --    < > = values in this interval not displayed.   GFR: Estimated Creatinine Clearance: 44.1 mL/min (A) (by C-G formula based on SCr of 1.11 mg/dL (H)).  Liver Function Tests: Recent Labs  Lab 09/16/23 1245 09/18/23 0456  AST 15  --   ALT 23  --   ALKPHOS 73  --   BILITOT 0.3  --   PROT 6.7  --   ALBUMIN  2.9* 2.7*   Radiology Studies: CT GUIDED PERITONEAL/RETROPERITONEAL FLUID DRAIN BY PERC CATH Result Date: 09/21/2023 INDICATION: 78 year old female with history of diverticular abscess. EXAM: CT PERC DRAIN PERITONEAL ABCESS COMPARISON:  09/20/2023, 09/16/2023 MEDICATIONS: The patient is currently admitted to the hospital and receiving intravenous antibiotics. The antibiotics were administered within an appropriate time frame prior to the initiation of  the procedure. ANESTHESIA/SEDATION: Moderate (conscious) sedation was employed during this procedure. A total of Versed  1 mg and  Fentanyl  50 mcg was administered intravenously. Moderate Sedation Time: 16 minutes. The patient's level of consciousness and vital signs were monitored continuously by radiology nursing throughout the procedure under my direct supervision. CONTRAST:  None COMPLICATIONS: None immediate. PROCEDURE: RADIATION DOSE REDUCTION: This exam was performed according to the departmental dose-optimization program which includes automated exposure control, adjustment of the mA and/or kV according to patient size and/or use of iterative reconstruction technique. Informed written consent was obtained from the patient after a discussion of the risks, benefits and alternatives to treatment. The patient was placed prone on the CT gantry and a pre procedural CT was performed re-demonstrating the known abscess/fluid collection within the mid pelvis. The procedure was planned. A timeout was performed prior to the initiation of the procedure. The right gluteal region was prepped and draped in the usual sterile fashion. The overlying soft tissues were anesthetized with 1% lidocaine  with epinephrine . Appropriate trajectory was planned with the use of a 22 gauge spinal needle. An 18 gauge trocar needle was advanced into the abscess/fluid collection and a short Amplatz super stiff wire was coiled within the collection. Appropriate positioning was confirmed with a limited CT scan. The tract was serially dilated allowing placement of a 10 [place] all-purpose drainage catheter. Appropriate positioning was confirmed with a limited postprocedural CT scan. Approximately 30 ml of purulent fluid was aspirated. The tube was connected to a bulb suction and sutured in place. A dressing was placed. The patient tolerated the procedure well without immediate post procedural complication. IMPRESSION: Successful CT guided placement of a transgluteal, 10 French all purpose drain catheter into the pelvic fluid collection with aspiration of 30 mL of purulent  fluid. Samples were sent for culture. Creasie Doctor, MD [facility] Electronically Signed   By: Creasie Doctor M.D.   On: 09/21/2023 16:07   ECHOCARDIOGRAM COMPLETE Result Date: 09/21/2023    ECHOCARDIOGRAM REPORT   Patient Name:   Felicia Frank Date of Exam: 09/21/2023 Medical Rec #:  [id number]       Height:       64.0 in Accession #:    [id number]      Weight:       188.1 lb Date of Birth:  [birth year]       BSA:          1.906 m Patient Age:    78 years        BP:           146/53 mmHg Patient Gender: F               HR:           66 bpm. Exam Location:  [facility] Procedure: 2D Echo, Cardiac Doppler and Color Doppler (Both Spectral and Color            Flow Doppler were utilized during procedure). Indications:    I48.91* Unspeicified atrial fibrillation  History:        Patient has no prior history of Echocardiogram examinations.                 Arrythmias:Atrial Fibrillation; Risk Factors:Hypertension.  Sonographer:    Andrena Bang Referring Phys: [id number] COURAGE EMOKPAE IMPRESSIONS  1. Left ventricular ejection fraction, by estimation, is 65 to 70%. The left ventricle has normal function. The left ventricle has no regional wall motion abnormalities. Left  ventricular diastolic parameters are indeterminate.  2. Right ventricular systolic function is normal. The right ventricular size is normal. Tricuspid regurgitation signal is inadequate for assessing PA pressure.  3. The mitral valve is normal in structure. No evidence of mitral valve regurgitation. No evidence of mitral stenosis.  4. The aortic valve is tricuspid. Aortic valve regurgitation is not visualized. No aortic stenosis is present. FINDINGS  Left Ventricle: Left ventricular ejection fraction, by estimation, is 65 to 70%. The left ventricle has normal function. The left ventricle has no regional wall motion abnormalities. Definity  contrast agent was given IV to delineate the left ventricular   endocardial borders. The left ventricular internal cavity size was normal in size. There is no left ventricular hypertrophy. Left ventricular diastolic parameters are indeterminate. Right Ventricle: The right ventricular size is normal. Right vetricular wall thickness was not well visualized. Right ventricular systolic function is normal. Tricuspid regurgitation signal is inadequate for assessing PA pressure. Left Atrium: Left atrial size was normal in size. Right Atrium: Right atrial size was normal in size. Pericardium: There is no evidence of pericardial effusion. Mitral Valve: The mitral valve is normal in structure. No evidence of mitral valve regurgitation. No evidence of mitral valve stenosis. Tricuspid Valve: The tricuspid valve is normal in structure. Tricuspid valve regurgitation is trivial. No evidence of tricuspid stenosis. Aortic Valve: The aortic valve is tricuspid. Aortic valve regurgitation is not visualized. No aortic stenosis is present. Aortic valve mean gradient measures 4.0 mmHg. Aortic valve peak gradient measures 10.9 mmHg. Aortic valve area, by VTI measures 1.88  cm. Pulmonic Valve: The pulmonic valve was not well visualized. Pulmonic valve regurgitation is not visualized. No evidence of pulmonic stenosis. Aorta: The aortic root and ascending aorta are structurally normal, with no evidence of dilitation. Venous: The inferior vena cava was not well visualized. IAS/Shunts: The interatrial septum was not well visualized.  LEFT VENTRICLE PLAX 2D LVIDd:         4.10 cm     Diastology LVIDs:         2.30 cm     LV e' medial:    6.84 cm/s LV PW:         0.80 cm     LV E/e' medial:  7.8 LV IVS:        0.90 cm     LV e' lateral:   9.14 cm/s LVOT diam:     1.70 cm     LV E/e' lateral: 5.9 LV SV:         55 LV SV Index:   29 LVOT Area:     2.27 cm  LV Volumes (MOD) LV vol d, MOD A2C: 72.6 ml LV vol d, MOD A4C: 81.7 ml LV vol s, MOD A2C: 21.2 ml LV vol s, MOD A4C: 28.1 ml LV SV MOD A2C:     51.4 ml LV  SV MOD A4C:     81.7 ml LV SV MOD BP:      53.2 ml RIGHT VENTRICLE RV S prime:     19.10 cm/s TAPSE (M-mode): 1.5 cm LEFT ATRIUM             Index LA diam:        3.00 cm 1.57 cm/m LA Vol (A2C):   48.2 ml 25.29 ml/m LA Vol (A4C):   35.8 ml 18.78 ml/m LA Biplane Vol: 41.6 ml 21.83 ml/m  AORTIC VALVE AV Area (Vmax):    1.93 cm AV Area (Vmean):   1.72 cm  AV Area (VTI):     1.88 cm AV Vmax:           165.00 cm/s AV Vmean:          88.200 cm/s AV VTI:            0.293 m AV Peak Grad:      10.9 mmHg AV Mean Grad:      4.0 mmHg LVOT Vmax:         140.00 cm/s LVOT Vmean:        66.800 cm/s LVOT VTI:          0.243 m LVOT/AV VTI ratio: 0.83  AORTA Ao Root diam: 3.20 cm Ao Asc diam:  2.80 cm MITRAL VALVE MV Area (PHT): 2.65 cm    SHUNTS MV Decel Time: 286 msec    Systemic VTI:  0.24 m MV E velocity: 53.60 cm/s  Systemic Diam: 1.70 cm MV A velocity: 81.50 cm/s MV E/A ratio:  0.66 Armida Lander MD Electronically signed by Armida Lander MD Signature Date/Time: 09/21/2023/11:47:20 AM    Final    Scheduled Meds:  Chlorhexidine  Gluconate Cloth  6 each Topical Q0600   docusate sodium   200 mg Oral QHS   doxazosin   8 mg Oral QHS   heparin  injection (subcutaneous)  5,000 Units Subcutaneous Q8H   levothyroxine   50 mcg Oral Q0600   metoprolol  tartrate  5 mg Intravenous Q6H   Continuous Infusions:  amiodarone  Stopped (09/22/23 1844)   dextrose  5 % and 0.9 % NaCl Stopped (09/22/23 1844)   famotidine  (PEPCID ) IV 20 mg (09/22/23 0840)   piperacillin -tazobactam (ZOSYN )  IV 3.375 g (09/22/23 0958)    LOS: 6 days   Justina Oman M.D on 09/22/2023 at 7:23 PM  Go to [email or web address] - for contact info  [facility] - Office  [phone]  If 7PM-7AM, please contact night-coverage [email or web address] 09/22/2023, 7:23 PM

## 2023-09-23 DIAGNOSIS — E039 Hypothyroidism, unspecified: Secondary | ICD-10-CM | POA: Diagnosis not present

## 2023-09-23 DIAGNOSIS — K5792 Diverticulitis of intestine, part unspecified, without perforation or abscess without bleeding: Secondary | ICD-10-CM | POA: Diagnosis not present

## 2023-09-23 DIAGNOSIS — E871 Hypo-osmolality and hyponatremia: Secondary | ICD-10-CM | POA: Diagnosis not present

## 2023-09-23 DIAGNOSIS — K632 Fistula of intestine: Secondary | ICD-10-CM | POA: Diagnosis not present

## 2023-09-23 LAB — BASIC METABOLIC PANEL WITH GFR
Anion gap: 8 (ref 5–15)
BUN: 18 mg/dL (ref 8–23)
CO2: 18 mmol/L — ABNORMAL LOW (ref 22–32)
Calcium: 7.8 mg/dL — ABNORMAL LOW (ref 8.9–10.3)
Chloride: 106 mmol/L (ref 98–111)
Creatinine, Ser: 1.05 mg/dL — ABNORMAL HIGH (ref 0.44–1.00)
GFR, Estimated: 54 mL/min — ABNORMAL LOW (ref >60)
Glucose, Bld: 122 mg/dL — ABNORMAL HIGH (ref 70–99)
Potassium: 3.9 mmol/L (ref 3.5–5.1)
Sodium: 132 mmol/L — ABNORMAL LOW (ref 135–145)

## 2023-09-23 LAB — CBC
HCT: 30.5 % — ABNORMAL LOW (ref 36.0–46.0)
Hemoglobin: 9.6 g/dL — ABNORMAL LOW (ref 12.0–15.0)
MCH: 31 pg (ref 26.0–34.0)
MCHC: 31.5 g/dL (ref 30.0–36.0)
MCV: 98.4 fL (ref 80.0–100.0)
Platelets: 200 10*3/uL (ref 150–400)
RBC: 3.1 MIL/uL — ABNORMAL LOW (ref 3.87–5.11)
RDW: 14.9 % (ref 11.5–15.5)
WBC: 12.1 10*3/uL — ABNORMAL HIGH (ref 4.0–10.5)
nRBC: 0 % (ref 0.0–0.2)

## 2023-09-23 LAB — GLUCOSE, CAPILLARY
Glucose-Capillary: 136 mg/dL — ABNORMAL HIGH (ref 70–99)
Glucose-Capillary: 85 mg/dL (ref 70–99)
Glucose-Capillary: 88 mg/dL (ref 70–99)
Glucose-Capillary: 94 mg/dL (ref 70–99)

## 2023-09-23 LAB — PHOSPHORUS: Phosphorus: 2.1 mg/dL — ABNORMAL LOW (ref 2.5–4.6)

## 2023-09-23 LAB — MAGNESIUM: Magnesium: 1.5 mg/dL — ABNORMAL LOW (ref 1.7–2.4)

## 2023-09-23 MED ORDER — POTASSIUM PHOSPHATES 15 MMOLE/5ML IV SOLN
30.0000 mmol | Freq: Once | INTRAVENOUS | Status: AC
  Administered 2023-09-23: 30 mmol via INTRAVENOUS
  Filled 2023-09-23: qty 10

## 2023-09-23 MED ORDER — MAGNESIUM SULFATE 2 GM/50ML IV SOLN
2.0000 g | Freq: Once | INTRAVENOUS | Status: AC
  Administered 2023-09-23: 2 g via INTRAVENOUS
  Filled 2023-09-23: qty 50

## 2023-09-23 MED ORDER — BISACODYL 10 MG RE SUPP
10.0000 mg | Freq: Once | RECTAL | Status: AC
  Administered 2023-09-23: 10 mg via RECTAL
  Filled 2023-09-23: qty 1

## 2023-09-23 NOTE — Progress Notes (Signed)
 Subjective: No significant changes since yesterday.  Is passing flatus.  No bowel movement yet.  Objective: Vital signs in last 24 hours: Temp:  [97.7 F (36.5 C)-98.2 F (36.8 C)] 97.9 F (36.6 C) (04/25 0738) Pulse Rate:  [50-78] 57 (04/25 1200) Resp:  [10-26] 11 (04/25 1200) BP: (102-172)/(44-66) 158/51 (04/25 1200) SpO2:  [96 %-100 %] 98 % (04/25 1200) Last BM Date : 09/15/23  Intake/Output from previous day: 04/24 0701 - 04/25 0700 In: 619.8 [I.V.:342; IV Piggyback:277.9] Out: 1220 [Urine:500; Emesis/NG output:700; Drains:20] Intake/Output this shift: Total I/O In: 501 [I.V.:112.8; IV Piggyback:388.2] Out: 0   General appearance: alert, cooperative, and no distress GI: Soft with minimal tenderness in the lower abdomen.  No rigidity is noted.  JP drain with minimal purulent drainage present.  Lab Results:  Recent Labs    09/22/23 0646 09/23/23 0441  WBC 14.8* 12.1*  HGB 9.5* 9.6*  HCT 29.1* 30.5*  PLT 247 200   BMET Recent Labs    09/22/23 0646 09/23/23 0441  NA 131* 132*  K 3.8 3.9  CL 104 106  CO2 20* 18*  GLUCOSE 137* 122*  BUN 23 18  CREATININE 1.11* 1.05*  CALCIUM 7.8* 7.8*   PT/INR Recent Labs    09/21/23 0611  LABPROT 14.6  INR 1.1    Studies/Results: CT GUIDED PERITONEAL/RETROPERITONEAL FLUID DRAIN BY PERC CATH Result Date: 09/21/2023 INDICATION: 78 year old female with history of diverticular abscess. EXAM: CT PERC DRAIN PERITONEAL ABCESS COMPARISON:  09/20/2023, 09/16/2023 MEDICATIONS: The patient is currently admitted to the hospital and receiving intravenous antibiotics. The antibiotics were administered within an appropriate time frame prior to the initiation of the procedure. ANESTHESIA/SEDATION: Moderate (conscious) sedation was employed during this procedure. A total of Versed  1 mg and Fentanyl  50 mcg was administered intravenously. Moderate Sedation Time: 16 minutes. The patient's level of consciousness and vital signs were  monitored continuously by radiology nursing throughout the procedure under my direct supervision. CONTRAST:  None COMPLICATIONS: None immediate. PROCEDURE: RADIATION DOSE REDUCTION: This exam was performed according to the departmental dose-optimization program which includes automated exposure control, adjustment of the mA and/or kV according to patient size and/or use of iterative reconstruction technique. Informed written consent was obtained from the patient after a discussion of the risks, benefits and alternatives to treatment. The patient was placed prone on the CT gantry and a pre procedural CT was performed re-demonstrating the known abscess/fluid collection within the mid pelvis. The procedure was planned. A timeout was performed prior to the initiation of the procedure. The right gluteal region was prepped and draped in the usual sterile fashion. The overlying soft tissues were anesthetized with 1% lidocaine  with epinephrine . Appropriate trajectory was planned with the use of a 22 gauge spinal needle. An 18 gauge trocar needle was advanced into the abscess/fluid collection and a short Amplatz super stiff wire was coiled within the collection. Appropriate positioning was confirmed with a limited CT scan. The tract was serially dilated allowing placement of a 10 [place] all-purpose drainage catheter. Appropriate positioning was confirmed with a limited postprocedural CT scan. Approximately 30 ml of purulent fluid was aspirated. The tube was connected to a bulb suction and sutured in place. A dressing was placed. The patient tolerated the procedure well without immediate post procedural complication. IMPRESSION: Successful CT guided placement of a transgluteal, 10 French all purpose drain catheter into the pelvic fluid collection with aspiration of 30 mL of purulent fluid. Samples were sent for culture. Creasie Doctor, MD [facility] Radiology Electronically  Signed   By: Creasie Doctor M.D.   On: 09/21/2023 16:07    Anti-infectives: Anti-infectives (From admission, onward)    Start     Dose/Rate Route Frequency Ordered Stop   09/19/23 2200  piperacillin -tazobactam (ZOSYN ) IVPB 3.375 g        3.375 g 12.5 mL/hr over 240 Minutes Intravenous Every 8 hours 09/19/23 1045     09/18/23 1030  meropenem  (MERREM ) 1 g in sodium chloride  0.9 % 100 mL IVPB  Status:  Discontinued        1 g 200 mL/hr over 30 Minutes Intravenous Every 12 hours 09/18/23 0934 09/19/23 1045   09/18/23 1000  ciprofloxacin  (CIPRO ) IVPB 400 mg  Status:  Discontinued        400 mg 200 mL/hr over 60 Minutes Intravenous Every 24 hours 09/17/23 1140 09/18/23 0833   09/17/23 0600  ciprofloxacin  (CIPRO ) IVPB 400 mg  Status:  Discontinued        400 mg 200 mL/hr over 60 Minutes Intravenous Every 12 hours 09/16/23 1954 09/17/23 1140   09/17/23 0600  metroNIDAZOLE  (FLAGYL ) IVPB 500 mg  Status:  Discontinued        500 mg 100 mL/hr over 60 Minutes Intravenous Every 12 hours 09/16/23 1954 09/18/23 0833   09/16/23 1815  ciprofloxacin  (CIPRO ) IVPB 400 mg        400 mg 200 mL/hr over 60 Minutes Intravenous  Once 09/16/23 1805 09/16/23 2013   09/16/23 1815  metroNIDAZOLE  (FLAGYL ) IVPB 500 mg        500 mg 100 mL/hr over 60 Minutes Intravenous  Once 09/16/23 1805 09/16/23 2014       Assessment/Plan: Impression: Leukocytolysin almost resolved.  Both gram-positive cocci and rods are noted on Gram stain.  Final ID pending.  Would continue Zosyn .  Will give Dulcolax suppository.  Hopefully can get NG tube out this weekend and start feeding the patient.  LOS: 7 days    [name] 09/23/2023

## 2023-09-23 NOTE — Plan of Care (Signed)

## 2023-09-23 NOTE — Plan of Care (Signed)
  Problem: Education: Goal: Knowledge of General Education information will improve Description: Including pain rating scale, medication(s)/side effects and non-pharmacologic comfort measures Outcome: Progressing   Problem: Health Behavior/Discharge Planning: Goal: Ability to manage health-related needs will improve Outcome: Progressing   Problem: Clinical Measurements: Goal: Ability to maintain clinical measurements within normal limits will improve Outcome: Progressing Goal: Will remain free from infection Outcome: Progressing Goal: Diagnostic test results will improve Outcome: Progressing Goal: Respiratory complications will improve Outcome: Progressing Goal: Cardiovascular complication will be avoided Outcome: Progressing   Problem: Activity: Goal: Risk for activity intolerance will decrease Outcome: Progressing   Problem: Nutrition: Goal: Adequate nutrition will be maintained Outcome: Not Progressing   Problem: Coping: Goal: Level of anxiety will decrease Outcome: Progressing   Problem: Elimination: Goal: Will not experience complications related to bowel motility Outcome: Progressing Goal: Will not experience complications related to urinary retention Outcome: Progressing   Problem: Pain Managment: Goal: General experience of comfort will improve and/or be controlled Outcome: Progressing   Problem: Safety: Goal: Ability to remain free from injury will improve Outcome: Progressing   Problem: Skin Integrity: Goal: Risk for impaired skin integrity will decrease Outcome: Progressing

## 2023-09-23 NOTE — Progress Notes (Signed)
 PROGRESS NOTE  Felicia Frank, is a 78 y.o. female, DOB - Feb 22, 1946, [id number]  Admit date - 09/16/2023   Admitting Physician Twilla Galea, DO  Outpatient Primary MD for the patient is [name], [name], MD  LOS - 7  Chief Complaint  Patient presents with   Fall      Brief Narrative:  78 y.o. 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 as well as superior endplate compression fracture of L1   -Assessment and Plan: 1)Acute Sigmoid Diverticulitis with coloenteric fistula--- -- discussed with general surgeon who advises nonoperative approach at this time -WBC 13.1 >>23.0>>> 23.4>>21.5>>19.1>> 14.8 -Stopped Cipro  and Flagyl  on 09/18/23  -Continue Zosyn  for now - Repeat CT Abdomen on 09/20/2023 with perforated sigmoid diverticulitis and organizing pelvic area fluid/gas collection measuring 6 x 4.4 CM--with coloenteric fistula and small bowel obstruction with transition point within the distal small bowel adjacent to the inflamed sigmoid colon  --Discussed with general surgeon Dr. Larrie Po; will continue following rec's. -IR consultation appreciated; s/p drain placement without complications. -c/n  As needed Dilaudid  and oxycodone  for pain control -c/n  As needed Zofran  for nausea and vomiting - Follow clinical response.  2)AKI----acute kidney injury on CKD stage - 3A - Creatinine on admission= 1.46, baseline creatinine = 1.3 (on 06/07/23) per Care Everywhere , -Creatinine peaked at 2.05 this admission -Creatinine trended down with hydration--currently 1.26 and is stable -Continue to renally adjust medications, avoid nephrotoxic agents / dehydration  / hypotension - Maintain adequate hydration.  3) paroxysmal atrial fibrillation--new this admission- - Went back into A-fib with RVR overnight  on 09/20/23 --required IV cardizem  and V amiodarone  - Continue IV metoprolol  and amiodarone . -Echo requested - TSH within normal limits. - General Surgery request that wel hold off on full anticoagulation for now, in case she ended needing any surgical intervention.  4)HypoNatremia--due to dehydration, emesis with GI losses/NG tube and poor oral intake -Sodium improving with hydration - c/n IV fluids as ordered and follow electrolytes trend. - Waiting for NG tube removal and resumption of intestinal movements in order to advance diet.  Continue to follow ultralights.  5)HTN- -stable vital signs - Continue current antihypertensive agents.  6)Hypothyroidism-- -continue treatment with Synthroid . - If able to tolerate p.o.'s.  7)Hyperkalemia--in the setting of worsening renal function/AKI on CKD   -Potassium normalized with Lokelma   - Continue to follow electrolytes trend.  8) chronic anemia--Hgb trended down with hemodilution most likely. - Baseline hemoglobin usually around 10; currently 9.5 - No bleeding concerns appreciated. - Continue monitoring hemoglobin trend intermittently.  9)L1 Compression Fx---Superior endplate compression fracture of L1 is new since MRI 06/22/2023. There is 15 percent vertebral body height loss centrally. 2 mm of retropulsion of the superior endplate. - As needed pain medication - TLSO brace advised when up. - Outpatient follow-up with spine specialist  Status is: Inpatient  Disposition: The patient is from: Home              Anticipated d/c is to: Home              Anticipated d/c date is: > 3 days              Patient currently is not medically stable to d/c.  Barriers: Not Clinically Stable-   Code Status :  -  Code Status: Full Code  Family Communication:   Daughter at bedside.  CRITICAL CARE Performed by: Justina Oman   Total critical care time: 45 minutes  Critical care time was exclusive of separately billable procedures and  treating other patients.  Critical care was necessary to treat or prevent imminent or life-threatening deterioration.  Critical care was time spent personally by me on the following activities: development of treatment plan with patient and/or surrogate as well as nursing, discussions with consultants, evaluation of patient's response to treatment, examination of patient, obtaining history from patient or surrogate, ordering and performing treatments and interventions, ordering and review of laboratory studies, ordering and review of radiographic studies, pulse oximetry and re-evaluation of patient's condition.     DVT Prophylaxis  :   - SCDs   heparin  injection 5,000 Units Start: 09/21/23 2200 Place and maintain sequential compression device Start: 09/20/23 1659 Place TED hose Start: 09/20/23 1659   Lab Results  Component Value Date   PLT 200 09/23/2023   Inpatient Medications  Scheduled Meds:  Chlorhexidine  Gluconate Cloth  6 each Topical Q0600   docusate sodium   200 mg Oral QHS   doxazosin   8 mg Oral QHS   heparin  injection (subcutaneous)  5,000 Units Subcutaneous Q8H   levothyroxine   50 mcg Oral Q0600   metoprolol  tartrate  5 mg Intravenous Q6H   sodium chloride  flush  5 mL Intracatheter Q8H   Continuous Infusions:  amiodarone  30 mg/hr (09/23/23 1200)   famotidine  (PEPCID ) IV Stopped (09/23/23 1024)   piperacillin -tazobactam (ZOSYN )  IV 12.5 mL/hr at 09/23/23 1200   PRN Meds:.HYDROmorphone  (DILAUDID ) injection, labetalol , ondansetron  (ZOFRAN ) IV, mouth rinse, oxyCODONE , zolpidem    Anti-infectives (From admission, onward)    Start     Dose/Rate Route Frequency Ordered Stop   09/19/23 2200  piperacillin -tazobactam (ZOSYN ) IVPB 3.375 g        3.375 g 12.5 mL/hr over 240 Minutes Intravenous Every 8 hours 09/19/23 1045     09/18/23 1030  meropenem  (MERREM ) 1 g in sodium chloride  0.9 % 100 mL IVPB  Status:  Discontinued        1 g 200 mL/hr over 30 Minutes Intravenous Every  12 hours 09/18/23 0934 09/19/23 1045   09/18/23 1000  ciprofloxacin  (CIPRO ) IVPB 400 mg  Status:  Discontinued        400 mg 200 mL/hr over 60 Minutes Intravenous Every 24 hours 09/17/23 1140 09/18/23 0833   09/17/23 0600  ciprofloxacin  (CIPRO ) IVPB 400 mg  Status:  Discontinued        400 mg 200 mL/hr over 60 Minutes Intravenous Every 12 hours 09/16/23 1954 09/17/23 1140   09/17/23 0600  metroNIDAZOLE  (FLAGYL ) IVPB 500 mg  Status:  Discontinued        500 mg 100 mL/hr over 60 Minutes Intravenous Every 12 hours 09/16/23 1954 09/18/23 0833   09/16/23 1815  ciprofloxacin  (CIPRO ) IVPB 400 mg        400 mg 200 mL/hr over 60 Minutes Intravenous  Once 09/16/23 1805 09/16/23 2013   09/16/23 1815  metroNIDAZOLE  (FLAGYL ) IVPB 500 mg        500 mg 100 mL/hr over 60 Minutes Intravenous  Once 09/16/23 1805 09/16/23 2014        Subjective: Felicia Frank no fever, no chest pain, no nausea or vomiting.  Continues present intermittent abdominal discomfort.  Passing gas but no bowel movements.  Objective: Vitals:   09/23/23 1200 09/23/23 1230 09/23/23 1300 09/23/23 1330  BP: (!) 158/51 (!) 148/50 (!) 144/50 (!) 149/55  Pulse: (!) 57 (!) 59 (!) 56 60  Resp: 11 20 12 20   Temp:  97.7 F (36.5 C)    TempSrc:  Oral    SpO2: 98% 98% 98% 98%  Weight:      Height:        Intake/Output Summary (Last 24 hours) at 09/23/2023 1632 Last data filed at 09/23/2023 1315 Gross per 24 hour  Intake 1125.78 ml  Output 1150 ml  Net -24.22 ml   Filed Weights   09/18/23 1125 09/20/23 0100 09/20/23 0327  Weight: 84.2 kg 85.3 kg 85.3 kg   Physical Exam General exam: Alert, awake, oriented x 3; G-tube in place.  No overnight events. Respiratory system: Good air movement bilaterally; no using accessory muscles. Cardiovascular system:RRR. No murmurs, rubs, gallops. Gastrointestinal system: Abdomen is nondistended, soft and mildly tender to palpation.  Drain tube in place. Central nervous system: No focal  neurological deficits. Extremities: No cyanosis or clubbing; 1+ edema appreciated bilaterally (upper and lower extremities). Skin: No petechiae. Psychiatry: Judgement and insight appear normal.  Flat affect appreciated on exam.   Data Reviewed: I have personally reviewed following labs and imaging studies  CBC: Recent Labs  Lab 09/19/23 0458 09/20/23 0015 09/21/23 0441 09/22/23 0646 09/23/23 0441  WBC 21.5* 19.1* 15.8* 14.8* 12.1*  HGB 8.9* 9.3* 9.4* 9.5* 9.6*  HCT 27.6* 27.8* 30.0* 29.1* 30.5*  MCV 96.2 96.2 97.7 95.4 98.4  PLT 245 263 262 247 200   Basic Metabolic Panel: Recent Labs  Lab 09/18/23 0456 09/18/23 0500 09/18/23 0807 09/19/23 0458 09/20/23 0015 09/21/23 0441 09/22/23 0646 09/23/23 0441  NA 130*  --   --  133* 131* 131* 131* 132*  K 5.2*  --    < > 4.8 4.6 4.2 3.8 3.9  CL 104  --   --  107 105 104 104 106  CO2 14*  --   --  16* 16* 20* 20* 18*  GLUCOSE 88  --   --  83 89 85 137* 122*  BUN 46*  --   --  42* 39* 33* 23 18  CREATININE 1.82*  --   --  1.47* 1.41* 1.26* 1.11* 1.05*  CALCIUM 8.9  --   --  8.5* 8.6* 8.2* 7.8* 7.8*  MG  --  1.7  --   --   --   --  1.4* 1.5*  PHOS 3.6  --   --   --   --   --   --  2.1*   < > = values in this interval not displayed.   GFR: Estimated Creatinine Clearance: 46.6 mL/min (A) (by C-G formula based on SCr of 1.05 mg/dL (H)).  Liver Function Tests: Recent Labs  Lab 09/18/23 0456  ALBUMIN 2.7*   Radiology Studies: No results found.  Scheduled Meds:  Chlorhexidine  Gluconate Cloth  6 each Topical Q0600   docusate sodium   200 mg Oral QHS   doxazosin   8 mg Oral QHS   heparin  injection (subcutaneous)  5,000 Units Subcutaneous Q8H   levothyroxine   50 mcg Oral Q0600   metoprolol  tartrate  5 mg Intravenous Q6H   sodium chloride  flush  5 mL Intracatheter Q8H   Continuous Infusions:  amiodarone  30 mg/hr (09/23/23 1200)   famotidine  (PEPCID ) IV Stopped (09/23/23 1024)   piperacillin -tazobactam (ZOSYN )  IV 12.5  mL/hr at 09/23/23 1200    LOS: 7 days   Justina Oman M.D on 09/23/2023 at 4:32 PM  Go to [email or web address] - for contact info  [facility] - Office  [phone]  If 7PM-7AM, please contact night-coverage [email or web address] 09/23/2023, 4:32 PM

## 2023-09-24 DIAGNOSIS — K5792 Diverticulitis of intestine, part unspecified, without perforation or abscess without bleeding: Secondary | ICD-10-CM | POA: Diagnosis not present

## 2023-09-24 DIAGNOSIS — S32010D Wedge compression fracture of first lumbar vertebra, subsequent encounter for fracture with routine healing: Secondary | ICD-10-CM | POA: Diagnosis not present

## 2023-09-24 DIAGNOSIS — K632 Fistula of intestine: Secondary | ICD-10-CM | POA: Diagnosis not present

## 2023-09-24 DIAGNOSIS — K5732 Diverticulitis of large intestine without perforation or abscess without bleeding: Secondary | ICD-10-CM | POA: Diagnosis not present

## 2023-09-24 DIAGNOSIS — E039 Hypothyroidism, unspecified: Secondary | ICD-10-CM | POA: Diagnosis not present

## 2023-09-24 LAB — GLUCOSE, CAPILLARY
Glucose-Capillary: 109 mg/dL — ABNORMAL HIGH (ref 70–99)
Glucose-Capillary: 130 mg/dL — ABNORMAL HIGH (ref 70–99)
Glucose-Capillary: 79 mg/dL (ref 70–99)
Glucose-Capillary: 90 mg/dL (ref 70–99)

## 2023-09-24 MED ORDER — BISACODYL 10 MG RE SUPP
10.0000 mg | Freq: Once | RECTAL | Status: AC
  Administered 2023-09-24: 10 mg via RECTAL
  Filled 2023-09-24: qty 1

## 2023-09-24 NOTE — Progress Notes (Addendum)
 NGT removed from patient, per order. 75 ml emptied from canister today.

## 2023-09-24 NOTE — Plan of Care (Signed)

## 2023-09-24 NOTE — Progress Notes (Signed)
 Subjective: Patient is currently comfortable.  She does have waves of pain in the lower abdomen which resolve on their own.  She has had a bowel movement and is passing flatus.  Objective: Vital signs in last 24 hours: Temp:  [97.6 F (36.4 C)-98.2 F (36.8 C)] 98.2 F (36.8 C) (04/26 0747) Pulse Rate:  [56-61] 60 (04/26 1000) Resp:  [11-21] 15 (04/26 1000) BP: (144-166)/(45-62) 147/62 (04/26 1000) SpO2:  [96 %-99 %] 99 % (04/26 1000) Last BM Date : 09/23/23  Intake/Output from previous day: 04/25 0701 - 04/26 0700 In: 526 [I.V.:137.8; IV Piggyback:388.2] Out: 690 [Emesis/NG output:675; Drains:15] Intake/Output this shift: Total I/O In: 496.2 [I.V.:320.8; IV Piggyback:175.4] Out: -   General appearance: alert, cooperative, and no distress Resp: clear to auscultation bilaterally Cardio: regular rate and rhythm, S1, S2 normal, no murmur, click, rub or gallop GI: Soft with minimal tenderness in the lower abdomen.  No rigidity is noted.  Bowel sounds present.  Drain with minimal purulent fluid present.  Lab Results:  Recent Labs    09/22/23 0646 09/23/23 0441  WBC 14.8* 12.1*  HGB 9.5* 9.6*  HCT 29.1* 30.5*  PLT 247 200   BMET Recent Labs    09/22/23 0646 09/23/23 0441  NA 131* 132*  K 3.8 3.9  CL 104 106  CO2 20* 18*  GLUCOSE 137* 122*  BUN 23 18  CREATININE 1.11* 1.05*  CALCIUM 7.8* 7.8*   PT/INR No results for input(s): "LABPROT", "INR" in the last 72 hours.  Studies/Results: No results found.  Anti-infectives: Anti-infectives (From admission, onward)    Start     Dose/Rate Route Frequency Ordered Stop   09/19/23 2200  piperacillin -tazobactam (ZOSYN ) IVPB 3.375 g        3.375 g 12.5 mL/hr over 240 Minutes Intravenous Every 8 hours 09/19/23 1045     09/18/23 1030  meropenem  (MERREM ) 1 g in sodium chloride  0.9 % 100 mL IVPB  Status:  Discontinued        1 g 200 mL/hr over 30 Minutes Intravenous Every 12 hours 09/18/23 0934 09/19/23 1045   09/18/23  1000  ciprofloxacin  (CIPRO ) IVPB 400 mg  Status:  Discontinued        400 mg 200 mL/hr over 60 Minutes Intravenous Every 24 hours 09/17/23 1140 09/18/23 0833   09/17/23 0600  ciprofloxacin  (CIPRO ) IVPB 400 mg  Status:  Discontinued        400 mg 200 mL/hr over 60 Minutes Intravenous Every 12 hours 09/16/23 1954 09/17/23 1140   09/17/23 0600  metroNIDAZOLE  (FLAGYL ) IVPB 500 mg  Status:  Discontinued        500 mg 100 mL/hr over 60 Minutes Intravenous Every 12 hours 09/16/23 1954 09/18/23 0833   09/16/23 1815  ciprofloxacin  (CIPRO ) IVPB 400 mg        400 mg 200 mL/hr over 60 Minutes Intravenous  Once 09/16/23 1805 09/16/23 2013   09/16/23 1815  metroNIDAZOLE  (FLAGYL ) IVPB 500 mg        500 mg 100 mL/hr over 60 Minutes Intravenous  Once 09/16/23 1805 09/16/23 2014       Assessment/Plan: Impression: Sigmoid diverticulitis with pelvic abscess and coloenteric fistula.  Abscess has been drained by IR.  Initial cultures show a strep species.  No labs available yet today. Plan: As patient's bowel function seems to have returned, will remove NG tube and start full liquid diet.  It is hoped that she will be able to tolerate a diet and go home on  p.o. antibiotics.  Should she fail advancement of her diet, surgery would be indicated.  This was discussed with her and her son.  Dr. Michaelene Admire present and aware of the plan.  LOS: 8 days    Felicia Frank 09/24/2023

## 2023-09-24 NOTE — Progress Notes (Signed)
 PROGRESS NOTE  Felicia Frank, is a 78 y.o. female, DOB - May 24, 1946, WUJ:[id number]  Admit date - 09/16/2023   Admitting Physician Twilla Galea, DO  Outpatient Primary MD for the patient is [name], Felicia Reels, MD  LOS - 8  Chief Complaint  Patient presents with   Fall      Brief Narrative:  78 y.o. 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 as well as superior endplate compression fracture of L1   -Assessment and Plan: 1)Acute Sigmoid Diverticulitis with coloenteric fistula--- -- discussed with general surgeon who advises nonoperative approach at this time -WBC 13.1 >>23.0>>> 23.4>>21.5>>19.1>> 14.8 -Stopped Cipro  and Flagyl  on 09/18/23  -Continue Zosyn  for now - Repeat CT Abdomen on 09/20/2023 with perforated sigmoid diverticulitis and organizing pelvic area fluid/gas collection measuring 6 x 4.4 CM--with coloenteric fistula and small bowel obstruction with transition point within the distal small bowel adjacent to the inflamed sigmoid colon  -IR consultation appreciated; s/p drain placement without complications. -c/n  As needed Dilaudid  and oxycodone  for pain control. -c/n  As needed Zofran  for nausea and vomiting - Follow clinical response. - Per discussion with general surgery planning to remove NG tube later today and advance diet to full liquid.  Assess tolerance prior to transition full regimen of medications to by mouth route.  2)AKI----acute kidney injury on CKD stage - 3A - Creatinine on admission= 1.46, baseline creatinine = 1.3 (on 06/07/23) per Care Everywhere , -Creatinine peaked at 2.05 this admission -Creatinine trended down with hydration--currently 1.26 and is stable -Continue to renally adjust medications, avoid nephrotoxic agents / dehydration  / hypotension - Continue to  maintain adequate hydration.  3) paroxysmal atrial fibrillation--new this admission- - Went back into A-fib with RVR overnight on 09/20/23 --required IV cardizem  and V amiodarone  - Continue IV metoprolol  and amiodarone . -Echo requested - TSH within normal limits. - General Surgery request that wel hold off on full anticoagulation for now, in case she ended needing any surgical intervention. - Planning to transition to oral route when tolerating by mouth.  4)HypoNatremia--due to dehydration, emesis with GI losses/NG tube and poor oral intake -Sodium improving with hydration - c/n IV fluids as ordered and follow electrolytes trend. - Waiting for NG tube removal and resumption of intestinal movements in order to advance diet.  Continue to follow electrolytes.  5)HTN- -stable vital signs - Continue current antihypertensive agents.  6)Hypothyroidism-- -continue treatment with Synthroid . - If able to tolerate p.o.'s.  7)Hyperkalemia--in the setting of worsening renal function/AKI on CKD   -Potassium normalized with Lokelma   - Continue to follow electrolytes trend intermittently.  8) chronic anemia--Hgb trended down with hemodilution most likely. - Baseline hemoglobin usually around 10; currently 9.5 - No bleeding concerns appreciated. - Continue monitoring hemoglobin trend intermittently.  9)L1 Compression Fx---Superior endplate compression fracture of L1 is new since MRI 06/22/2023. There is 15 percent vertebral body height loss centrally. 2 mm of retropulsion of the superior endplate. - As needed pain medication - TLSO brace advised when up. - Outpatient follow-up with spine specialist  Status is: Inpatient  Disposition: The patient is from: Home              Anticipated d/c is to: Home              Anticipated d/c date is: > 3 days  Patient currently is not medically stable to d/c.  Barriers: Not Clinically Stable-   Code Status :  -  Code Status: Full Code    Family Communication:   Son at bedside.  CRITICAL CARE Performed by: Justina Oman   Total critical care time: 40 minutes  Critical care time was exclusive of separately billable procedures and treating other patients.  Critical care was necessary to treat or prevent imminent or life-threatening deterioration.  Critical care was time spent personally by me on the following activities: development of treatment plan with patient and/or surrogate as well as nursing, discussions with consultants, evaluation of patient's response to treatment, examination of patient, obtaining history from patient or surrogate, ordering and performing treatments and interventions, ordering and review of laboratory studies, ordering and review of radiographic studies, pulse oximetry and re-evaluation of patient's condition.     DVT Prophylaxis  :   - SCDs   heparin  injection 5,000 Units Start: 09/21/23 2200 Place and maintain sequential compression device Start: 09/20/23 1659 Place TED hose Start: 09/20/23 1659   Lab Results  Component Value Date   PLT 200 09/23/2023   Inpatient Medications  Scheduled Meds:  Chlorhexidine  Gluconate Cloth  6 each Topical Q0600   docusate sodium   200 mg Oral QHS   doxazosin   8 mg Oral QHS   heparin  injection (subcutaneous)  5,000 Units Subcutaneous Q8H   levothyroxine   50 mcg Oral Q0600   metoprolol  tartrate  5 mg Intravenous Q6H   sodium chloride  flush  5 mL Intracatheter Q8H   Continuous Infusions:  amiodarone  30 mg/hr (09/24/23 1550)   famotidine  (PEPCID ) IV Stopped (09/24/23 1108)   piperacillin -tazobactam (ZOSYN )  IV Stopped (09/24/23 1445)   PRN Meds:.HYDROmorphone  (DILAUDID ) injection, labetalol , ondansetron  (ZOFRAN ) IV, mouth rinse, oxyCODONE , zolpidem    Anti-infectives (From admission, onward)    Start     Dose/Rate Route Frequency Ordered Stop   09/19/23 2200  piperacillin -tazobactam (ZOSYN ) IVPB 3.375 g        3.375 g 12.5 mL/hr over 240  Minutes Intravenous Every 8 hours 09/19/23 1045     09/18/23 1030  meropenem  (MERREM ) 1 g in sodium chloride  0.9 % 100 mL IVPB  Status:  Discontinued        1 g 200 mL/hr over 30 Minutes Intravenous Every 12 hours 09/18/23 0934 09/19/23 1045   09/18/23 1000  ciprofloxacin  (CIPRO ) IVPB 400 mg  Status:  Discontinued        400 mg 200 mL/hr over 60 Minutes Intravenous Every 24 hours 09/17/23 1140 09/18/23 0833   09/17/23 0600  ciprofloxacin  (CIPRO ) IVPB 400 mg  Status:  Discontinued        400 mg 200 mL/hr over 60 Minutes Intravenous Every 12 hours 09/16/23 1954 09/17/23 1140   09/17/23 0600  metroNIDAZOLE  (FLAGYL ) IVPB 500 mg  Status:  Discontinued        500 mg 100 mL/hr over 60 Minutes Intravenous Every 12 hours 09/16/23 1954 09/18/23 0833   09/16/23 1815  ciprofloxacin  (CIPRO ) IVPB 400 mg        400 mg 200 mL/hr over 60 Minutes Intravenous  Once 09/16/23 1805 09/16/23 2013   09/16/23 1815  metroNIDAZOLE  (FLAGYL ) IVPB 500 mg        500 mg 100 mL/hr over 60 Minutes Intravenous  Once 09/16/23 1805 09/16/23 2014        Subjective: [place] no fever, no chest pain, no nausea or vomiting.  Passing gas.  NG tube is still in place.  Objective: Vitals:   09/24/23 1043 09/24/23 1136 09/24/23 1230 09/24/23 1300  BP: (!) 182/56  (!) 147/63 (!) 140/69  Pulse: 62  67 64  Resp: 14  (!) 21 20  Temp:  98.1 F (36.7 C)    TempSrc:  Oral    SpO2: 98%  99% 99%  Weight:      Height:        Intake/Output Summary (Last 24 hours) at 09/24/2023 1602 Last data filed at 09/24/2023 1550 Gross per 24 hour  Intake 991.12 ml  Output 460 ml  Net 531.12 ml   Filed Weights   09/18/23 1125 09/20/23 0100 09/20/23 0327  Weight: 84.2 kg 85.3 kg 85.3 kg   Physical Exam General exam: Alert, awake, oriented x 3; still reporting intermittent abdominal pain requiring IV analgesics.  Passing gas. Respiratory system: Good saturation on room air. Cardiovascular system: Rate controlled, currently sinus  rhythm; no rubs, no gallops. Gastrointestinal system: Abdomen is obese, positive bowel sounds appreciated; drain tube in place.  No guarding.  Positive tenderness on palpation appreciated on exam.   Central nervous system: Alert and oriented. No focal neurological deficits. Extremities: No cyanosis or clubbing; 1+ edema appreciated bilaterally (upper and lower extremity). Skin: No petechiae. Psychiatry: Flat affect appreciated on exam.  Data Reviewed: I have personally reviewed following labs and imaging studies  CBC: Recent Labs  Lab 09/19/23 0458 09/20/23 0015 09/21/23 0441 09/22/23 0646 09/23/23 0441  WBC 21.5* 19.1* 15.8* 14.8* 12.1*  HGB 8.9* 9.3* 9.4* 9.5* 9.6*  HCT 27.6* 27.8* 30.0* 29.1* 30.5*  MCV 96.2 96.2 97.7 95.4 98.4  PLT 245 263 262 247 200   Basic Metabolic Panel: Recent Labs  Lab 09/18/23 0456 09/18/23 0500 09/18/23 0807 09/19/23 0458 09/20/23 0015 09/21/23 0441 09/22/23 0646 09/23/23 0441  NA 130*  --   --  133* 131* 131* 131* 132*  K 5.2*  --    < > 4.8 4.6 4.2 3.8 3.9  CL 104  --   --  107 105 104 104 106  CO2 14*  --   --  16* 16* 20* 20* 18*  GLUCOSE 88  --   --  83 89 85 137* 122*  BUN 46*  --   --  42* 39* 33* 23 18  CREATININE 1.82*  --   --  1.47* 1.41* 1.26* 1.11* 1.05*  CALCIUM 8.9  --   --  8.5* 8.6* 8.2* 7.8* 7.8*  MG  --  1.7  --   --   --   --  1.4* 1.5*  PHOS 3.6  --   --   --   --   --   --  2.1*   < > = values in this interval not displayed.   GFR: Estimated Creatinine Clearance: 46.6 mL/min (A) (by C-G formula based on SCr of 1.05 mg/dL (H)).  Liver Function Tests: Recent Labs  Lab 09/18/23 0456  ALBUMIN 2.7*   Radiology Studies: No results found.  Scheduled Meds:  Chlorhexidine  Gluconate Cloth  6 each Topical Q0600   docusate sodium   200 mg Oral QHS   doxazosin   8 mg Oral QHS   heparin  injection (subcutaneous)  5,000 Units Subcutaneous Q8H   levothyroxine   50 mcg Oral Q0600   metoprolol  tartrate  5 mg Intravenous Q6H    sodium chloride  flush  5 mL Intracatheter Q8H   Continuous Infusions:  amiodarone  30 mg/hr (09/24/23 1550)   famotidine  (PEPCID ) IV Stopped (09/24/23 1108)   piperacillin -tazobactam (  ZOSYN )  IV Stopped (09/24/23 1445)    LOS: 8 days   Justina Oman M.D on 09/24/2023 at 4:02 PM  Go to [email or web address] - for contact info  [facility] - Office  [phone]  If 7PM-7AM, please contact night-coverage [email or web address] 09/24/2023, 4:02 PM

## 2023-09-25 DIAGNOSIS — K632 Fistula of intestine: Secondary | ICD-10-CM | POA: Diagnosis not present

## 2023-09-25 DIAGNOSIS — K5732 Diverticulitis of large intestine without perforation or abscess without bleeding: Secondary | ICD-10-CM | POA: Diagnosis not present

## 2023-09-25 DIAGNOSIS — E039 Hypothyroidism, unspecified: Secondary | ICD-10-CM | POA: Diagnosis not present

## 2023-09-25 DIAGNOSIS — K5792 Diverticulitis of intestine, part unspecified, without perforation or abscess without bleeding: Secondary | ICD-10-CM | POA: Diagnosis not present

## 2023-09-25 DIAGNOSIS — E871 Hypo-osmolality and hyponatremia: Secondary | ICD-10-CM | POA: Diagnosis not present

## 2023-09-25 LAB — GLUCOSE, CAPILLARY
Glucose-Capillary: 111 mg/dL — ABNORMAL HIGH (ref 70–99)
Glucose-Capillary: 120 mg/dL — ABNORMAL HIGH (ref 70–99)
Glucose-Capillary: 93 mg/dL (ref 70–99)
Glucose-Capillary: 95 mg/dL (ref 70–99)

## 2023-09-25 LAB — CBC
HCT: 28 % — ABNORMAL LOW (ref 36.0–46.0)
Hemoglobin: 8.8 g/dL — ABNORMAL LOW (ref 12.0–15.0)
MCH: 30.7 pg (ref 26.0–34.0)
MCHC: 31.4 g/dL (ref 30.0–36.0)
MCV: 97.6 fL (ref 80.0–100.0)
Platelets: 250 10*3/uL (ref 150–400)
RBC: 2.87 MIL/uL — ABNORMAL LOW (ref 3.87–5.11)
RDW: 14.6 % (ref 11.5–15.5)
WBC: 9.4 10*3/uL (ref 4.0–10.5)
nRBC: 0 % (ref 0.0–0.2)

## 2023-09-25 LAB — MAGNESIUM: Magnesium: 1.5 mg/dL — ABNORMAL LOW (ref 1.7–2.4)

## 2023-09-25 LAB — BASIC METABOLIC PANEL WITH GFR
Anion gap: 8 (ref 5–15)
BUN: 12 mg/dL (ref 8–23)
CO2: 22 mmol/L (ref 22–32)
Calcium: 8 mg/dL — ABNORMAL LOW (ref 8.9–10.3)
Chloride: 102 mmol/L (ref 98–111)
Creatinine, Ser: 1.03 mg/dL — ABNORMAL HIGH (ref 0.44–1.00)
GFR, Estimated: 56 mL/min — ABNORMAL LOW (ref >60)
Glucose, Bld: 92 mg/dL (ref 70–99)
Potassium: 4.1 mmol/L (ref 3.5–5.1)
Sodium: 132 mmol/L — ABNORMAL LOW (ref 135–145)

## 2023-09-25 LAB — PHOSPHORUS: Phosphorus: 2.1 mg/dL — ABNORMAL LOW (ref 2.5–4.6)

## 2023-09-25 MED ORDER — FAMOTIDINE 20 MG PO TABS
20.0000 mg | ORAL_TABLET | Freq: Two times a day (BID) | ORAL | Status: DC
  Administered 2023-09-25 – 2023-09-28 (×7): 20 mg via ORAL
  Filled 2023-09-25 (×7): qty 1

## 2023-09-25 MED ORDER — BISACODYL 10 MG RE SUPP
10.0000 mg | Freq: Two times a day (BID) | RECTAL | Status: DC
  Administered 2023-09-25 – 2023-09-27 (×5): 10 mg via RECTAL
  Filled 2023-09-25 (×7): qty 1

## 2023-09-25 MED ORDER — METOPROLOL TARTRATE 50 MG PO TABS
50.0000 mg | ORAL_TABLET | Freq: Two times a day (BID) | ORAL | Status: DC
  Administered 2023-09-25 – 2023-09-27 (×6): 50 mg via ORAL
  Filled 2023-09-25 (×7): qty 1

## 2023-09-25 MED ORDER — SODIUM PHOSPHATES 45 MMOLE/15ML IV SOLN
15.0000 mmol | Freq: Once | INTRAVENOUS | Status: AC
  Administered 2023-09-25: 15 mmol via INTRAVENOUS
  Filled 2023-09-25: qty 5

## 2023-09-25 MED ORDER — MAGNESIUM SULFATE 2 GM/50ML IV SOLN
2.0000 g | Freq: Once | INTRAVENOUS | Status: AC
  Administered 2023-09-25: 2 g via INTRAVENOUS
  Filled 2023-09-25: qty 50

## 2023-09-25 MED ORDER — METRONIDAZOLE 500 MG PO TABS
500.0000 mg | ORAL_TABLET | Freq: Three times a day (TID) | ORAL | Status: DC
  Administered 2023-09-25 – 2023-09-28 (×10): 500 mg via ORAL
  Filled 2023-09-25 (×10): qty 1

## 2023-09-25 MED ORDER — METOPROLOL TARTRATE 5 MG/5ML IV SOLN: 5.0000 mg | Freq: Three times a day (TID) | INTRAVENOUS | Status: DC

## 2023-09-25 MED ORDER — BOOST / RESOURCE BREEZE PO LIQD CUSTOM
1.0000 | Freq: Three times a day (TID) | ORAL | Status: DC
Start: 2023-09-25 — End: 2023-09-28
  Administered 2023-09-25 – 2023-09-26 (×2): 1 via ORAL

## 2023-09-25 MED ORDER — AMIODARONE HCL 200 MG PO TABS
200.0000 mg | ORAL_TABLET | Freq: Every day | ORAL | Status: DC
  Administered 2023-09-25 – 2023-09-28 (×4): 200 mg via ORAL
  Filled 2023-09-25 (×4): qty 1

## 2023-09-25 MED ORDER — CIPROFLOXACIN HCL 250 MG PO TABS
500.0000 mg | ORAL_TABLET | Freq: Two times a day (BID) | ORAL | Status: DC
  Administered 2023-09-25 – 2023-09-28 (×7): 500 mg via ORAL
  Filled 2023-09-25 (×7): qty 2

## 2023-09-25 NOTE — Progress Notes (Signed)
 PROGRESS NOTE  Felicia Frank, is a 78 y.o. female, DOB - 07-09-1945, JYN:[id number]  Admit date - 09/16/2023   Admitting Physician Twilla Galea, DO  Outpatient Primary MD for the patient is [name], [name], MD  LOS - 9  Chief Complaint  Patient presents with   Fall      Brief Narrative:  78 y.o. 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 - Repeat CT Abdomen on 09/20/2023 with perforated sigmoid diverticulitis and organizing pelvic area fluid/gas collection measuring 6 x 4.4 CM--with coloenteric fistula and small bowel obstruction with transition point within the distal small bowel adjacent to the inflamed sigmoid colon  -IR consultation appreciated; s/p drain placement without complications on 09/21/23. -c/n  As needed Dilaudid  and oxycodone  for pain control. -c/n  As needed Zofran  for nausea and vomiting symptoms. - Per discussion with general surgery NG tube has been removed; will continue full liquid diet, antibiotics and medications will be transitioned to oral route and patient transfer to telemetry bed.  Increase physical activity and continue to follow clinical response. - Patient is afebrile and with normal WBCs currently.  2)AKI----acute kidney injury on CKD stage - 3A - Creatinine on admission= 1.46, baseline creatinine = 1.3 (on 06/07/23) per Care Everywhere , -Creatinine peaked at 2.05 this admission -Creatinine trended down with  hydration--currently 1.26 and is stable -Continue to renally adjust medications, avoid nephrotoxic agents / dehydration  / hypotension - Continue to maintain adequate hydration.  3) paroxysmal atrial fibrillation--new this admission- - Went back into A-fib with RVR overnight on 09/20/23 --required IV cardizem  and V amiodarone  - Adjusted dose of metoprolol  and oral amiodarone  daily. -Echo requested; demonstrated preserved ejection fraction without wall motion abnormality.  No significant valvular disorder. - TSH within normal limits. - General Surgery request that wel hold off on full anticoagulation for now, in case she ended needing any surgical intervention. - Planning to transition to oral route when tolerating by mouth.  4)HypoNatremia/hypomagnesemia and hypophosphatemia--due to dehydration, emesis with GI losses/NG tube and poor oral intake -Sodium improving with hydration. - Continue advancing diet as tolerated - Electrolytes have been repleted and will follow trend.  5)HTN- -stable vital signs - Continue current antihypertensive agents.  6)Hypothyroidism-- -continue treatment with Synthroid . - If able to tolerate p.o.'s.  7)Hyperkalemia--in the setting of worsening renal function/AKI on CKD   -Potassium normalized with Lokelma   - Continue to follow electrolytes trend intermittently.  8) chronic anemia--Hgb trended down with hemodilution most likely. - Baseline hemoglobin usually around 10; currently 9.5 - No bleeding concerns appreciated. - Continue monitoring hemoglobin trend intermittently.  9)L1 Compression Fx---Superior endplate compression fracture of L1 is new since MRI 06/22/2023. There is 15 percent vertebral body height loss centrally. 2 mm of retropulsion of the superior endplate. - As needed pain medication - TLSO brace advised when up. - Outpatient follow-up with spine specialist  Status is: Inpatient  Disposition: The patient is from: Home               Anticipated d/c is to: Home  Anticipated d/c date is: > 3 days              Patient currently is not medically stable to d/c.  Barriers: Not Clinically Stable-   Code Status :  -  Code Status: Full Code   Family Communication: No family at bedside on today's evaluation.   DVT Prophylaxis  :   - SCDs   heparin  injection 5,000 Units Start: 09/21/23 2200 Place and maintain sequential compression device Start: 09/20/23 1659 Place TED hose Start: 09/20/23 1659   Lab Results  Component Value Date   PLT 250 09/25/2023   Inpatient Medications  Scheduled Meds:  amiodarone   200 mg Oral Daily   bisacodyl   10 mg Rectal BID   Chlorhexidine  Gluconate Cloth  6 each Topical Q0600   ciprofloxacin   500 mg Oral BID   docusate sodium   200 mg Oral QHS   doxazosin   8 mg Oral QHS   famotidine   20 mg Oral BID   feeding supplement  1 Container Oral TID BM   heparin  injection (subcutaneous)  5,000 Units Subcutaneous Q8H   levothyroxine   50 mcg Oral Q0600   metoprolol  tartrate  50 mg Oral BID   metroNIDAZOLE   500 mg Oral Q8H   sodium chloride  flush  5 mL Intracatheter Q8H   Continuous Infusions:  sodium PHOSPHATE IVPB (in mmol) 15 mmol (09/25/23 0940)   PRN Meds:.HYDROmorphone  (DILAUDID ) injection, labetalol , ondansetron  (ZOFRAN ) IV, mouth rinse, oxyCODONE , zolpidem    Anti-infectives (From admission, onward)    Start     Dose/Rate Route Frequency Ordered Stop   09/25/23 0900  ciprofloxacin  (CIPRO ) tablet 500 mg        500 mg Oral 2 times daily 09/25/23 0811     09/25/23 0900  metroNIDAZOLE  (FLAGYL ) tablet 500 mg        500 mg Oral Every 8 hours 09/25/23 0811     09/19/23 2200  piperacillin -tazobactam (ZOSYN ) IVPB 3.375 g  Status:  Discontinued        3.375 g 12.5 mL/hr over 240 Minutes Intravenous Every 8 hours 09/19/23 1045 09/25/23 0811   09/18/23 1030  meropenem  (MERREM ) 1 g in sodium chloride  0.9 % 100 mL IVPB  Status:  Discontinued        1 g 200 mL/hr over 30 Minutes  Intravenous Every 12 hours 09/18/23 0934 09/19/23 1045   09/18/23 1000  ciprofloxacin  (CIPRO ) IVPB 400 mg  Status:  Discontinued        400 mg 200 mL/hr over 60 Minutes Intravenous Every 24 hours 09/17/23 1140 09/18/23 0833   09/17/23 0600  ciprofloxacin  (CIPRO ) IVPB 400 mg  Status:  Discontinued        400 mg 200 mL/hr over 60 Minutes Intravenous Every 12 hours 09/16/23 1954 09/17/23 1140   09/17/23 0600  metroNIDAZOLE  (FLAGYL ) IVPB 500 mg  Status:  Discontinued        500 mg 100 mL/hr over 60 Minutes Intravenous Every 12 hours 09/16/23 1954 09/18/23 0833   09/16/23 1815  ciprofloxacin  (CIPRO ) IVPB 400 mg        400 mg 200 mL/hr over 60 Minutes Intravenous  Once 09/16/23 1805 09/16/23 2013   09/16/23 1815  metroNIDAZOLE  (FLAGYL ) IVPB 500 mg        500 mg 100 mL/hr over 60 Minutes Intravenous  Once 09/16/23 1805 09/16/23 2014        Subjective: Felicia Frank afebrile, no chest pain, reports no nausea vomiting.  Improved abdominal discomfort; still intermittently present.  NG tube has been removed, patient continue passing gas and report small bowel movement.  Objective: Vitals:   09/25/23 0700 09/25/23 0800 09/25/23 0905 09/25/23 1030  BP: (!) 140/47 (!) 153/83 (!) 157/84 (!) 128/39  Pulse: (!) 59 79 78 (!) 54  Resp: 19 19  13   Temp:      TempSrc:      SpO2:  97%  100%  Weight:      Height:        Intake/Output Summary (Last 24 hours) at 09/25/2023 1101 Last data filed at 09/25/2023 0754 Gross per 24 hour  Intake 1095.8 ml  Output 575 ml  Net 520.8 ml   Filed Weights   09/18/23 1125 09/20/23 0100 09/20/23 0327  Weight: 84.2 kg 85.3 kg 85.3 kg   Physical Exam General exam: Alert, awake, oriented x 3; in no acute distress.  No overnight events. Respiratory system: Clear to auscultation. Respiratory effort normal.  Good saturation on room air. Cardiovascular system: Sinus rhythm, no rubs, no gallops, no JVD on exam. Gastrointestinal system: Abdomen is obese,  nondistended, soft and mild tenderness to palpation appreciated; positive bowel sounds.  Drain tube in place. Central nervous system: Alert and oriented. No focal neurological deficits. Extremities: No cyanosis or clubbing; trace to 1+ edema appreciated bilaterally. Skin: No petechiae. Psychiatry: Judgement and insight appear normal.  Flat affect appreciated on exam.  Data Reviewed: I have personally reviewed following labs and imaging studies  CBC: Recent Labs  Lab 09/20/23 0015 09/21/23 0441 09/22/23 0646 09/23/23 0441 09/25/23 0428  WBC 19.1* 15.8* 14.8* 12.1* 9.4  HGB 9.3* 9.4* 9.5* 9.6* 8.8*  HCT 27.8* 30.0* 29.1* 30.5* 28.0*  MCV 96.2 97.7 95.4 98.4 97.6  PLT 263 262 247 200 250   Basic Metabolic Panel: Recent Labs  Lab 09/20/23 0015 09/21/23 0441 09/22/23 0646 09/23/23 0441 09/25/23 0428  NA 131* 131* 131* 132* 132*  K 4.6 4.2 3.8 3.9 4.1  CL 105 104 104 106 102  CO2 16* 20* 20* 18* 22  GLUCOSE 89 85 137* 122* 92  BUN 39* 33* 23 18 12   CREATININE 1.41* 1.26* 1.11* 1.05* 1.03*  CALCIUM 8.6* 8.2* 7.8* 7.8* 8.0*  MG  --   --  1.4* 1.5* 1.5*  PHOS  --   --   --  2.1* 2.1*   GFR: Estimated Creatinine Clearance: 47.5 mL/min (A) (by C-G formula based on SCr of 1.03 mg/dL (H)).  Liver Function Tests: No results for input(s): "AST", "ALT", "ALKPHOS", "BILITOT", "PROT", "ALBUMIN" in the last 168 hours.  Radiology Studies: No results found.  Scheduled Meds:  amiodarone   200 mg Oral Daily   bisacodyl   10 mg Rectal BID   Chlorhexidine  Gluconate Cloth  6 each Topical Q0600   ciprofloxacin   500 mg Oral BID   docusate sodium   200 mg Oral QHS   doxazosin   8 mg Oral QHS   famotidine   20 mg Oral BID   feeding supplement  1 Container Oral TID BM   heparin  injection (subcutaneous)  5,000 Units Subcutaneous Q8H   levothyroxine   50 mcg Oral Q0600   metoprolol  tartrate  50 mg Oral BID   metroNIDAZOLE   500 mg Oral Q8H   sodium chloride  flush  5 mL Intracatheter Q8H    Continuous Infusions:  sodium PHOSPHATE IVPB (in mmol) 15 mmol (09/25/23 0940)    LOS: 9 days   Justina Oman M.D on 09/25/2023 at 11:01 AM  Go to [email or web address] - for contact info  [facility] - Office  [phone]  If 7PM-7AM, please contact night-coverage [email or web address] 09/25/2023, 11:01 AM

## 2023-09-25 NOTE — Plan of Care (Signed)

## 2023-09-25 NOTE — Progress Notes (Signed)
 Subjective: Patient looks much better this morning.  She is sitting up in a chair.  She has been ambulating.  She is passing gas.  Objective: Vital signs in last 24 hours: Temp:  [97.5 F (36.4 C)-98.2 F (36.8 C)] 98 F (36.7 C) (04/27 0446) Pulse Rate:  [54-77] 59 (04/27 0700) Resp:  [11-28] 19 (04/27 0700) BP: (120-182)/(42-69) 140/47 (04/27 0700) SpO2:  [93 %-100 %] 97 % (04/27 0500) Last BM Date : 09/23/23  Intake/Output from previous day: 04/26 0701 - 04/27 0700 In: 1191.1 [P.O.:440; I.V.:482.1; IV Piggyback:269.1] Out: 425 [Urine:400; Emesis/NG output:10; Drains:15] Intake/Output this shift: Total I/O In: 400.9 [I.V.:258.5; IV Piggyback:142.4] Out: 150 [Urine:150]  General appearance: alert, cooperative, and no distress GI: soft, non-tender; bowel sounds normal; no masses,  no organomegaly and JP drain with minimal serous drainage.  Lab Results:  Recent Labs    09/23/23 0441 09/25/23 0428  WBC 12.1* 9.4  HGB 9.6* 8.8*  HCT 30.5* 28.0*  PLT 200 250   BMET Recent Labs    09/23/23 0441 09/25/23 0428  NA 132* 132*  K 3.9 4.1  CL 106 102  CO2 18* 22  GLUCOSE 122* 92  BUN 18 12  CREATININE 1.05* 1.03*  CALCIUM 7.8* 8.0*   PT/INR No results for input(s): "LABPROT", "INR" in the last 72 hours.  Studies/Results: No results found.  Anti-infectives: Anti-infectives (From admission, onward)    Start     Dose/Rate Route Frequency Ordered Stop   09/25/23 0900  ciprofloxacin  (CIPRO ) tablet 500 mg        500 mg Oral 2 times daily 09/25/23 0811     09/25/23 0900  metroNIDAZOLE  (FLAGYL ) tablet 500 mg        500 mg Oral Every 8 hours 09/25/23 0811     09/19/23 2200  piperacillin -tazobactam (ZOSYN ) IVPB 3.375 g  Status:  Discontinued        3.375 g 12.5 mL/hr over 240 Minutes Intravenous Every 8 hours 09/19/23 1045 09/25/23 0811   09/18/23 1030  meropenem  (MERREM ) 1 g in sodium chloride  0.9 % 100 mL IVPB  Status:  Discontinued        1 g 200 mL/hr over 30  Minutes Intravenous Every 12 hours 09/18/23 0934 09/19/23 1045   09/18/23 1000  ciprofloxacin  (CIPRO ) IVPB 400 mg  Status:  Discontinued        400 mg 200 mL/hr over 60 Minutes Intravenous Every 24 hours 09/17/23 1140 09/18/23 0833   09/17/23 0600  ciprofloxacin  (CIPRO ) IVPB 400 mg  Status:  Discontinued        400 mg 200 mL/hr over 60 Minutes Intravenous Every 12 hours 09/16/23 1954 09/17/23 1140   09/17/23 0600  metroNIDAZOLE  (FLAGYL ) IVPB 500 mg  Status:  Discontinued        500 mg 100 mL/hr over 60 Minutes Intravenous Every 12 hours 09/16/23 1954 09/18/23 0833   09/16/23 1815  ciprofloxacin  (CIPRO ) IVPB 400 mg        400 mg 200 mL/hr over 60 Minutes Intravenous  Once 09/16/23 1805 09/16/23 2013   09/16/23 1815  metroNIDAZOLE  (FLAGYL ) IVPB 500 mg        500 mg 100 mL/hr over 60 Minutes Intravenous  Once 09/16/23 1805 09/16/23 2014       Assessment/Plan: Impression: Progressing well.  Sigmoid diverticulitis with pelvic abscess and coloenteric fistula.  Final cultures revealed E. coli and Pseudomonas.  Will switch to ciprofloxacin  and Flagyl  orally.  Will add boost to her regimen.  Will  advance diet once she has another bowel movement.  She is tolerating full liquid diet well.  No nausea has been noted.  LOS: 9 days    Felicia Frank 09/25/2023

## 2023-09-25 NOTE — Plan of Care (Signed)
   Problem: Education: Goal: Knowledge of General Education information will improve Description: Including pain rating scale, medication(s)/side effects and non-pharmacologic comfort measures Outcome: Progressing   Problem: Clinical Measurements: Goal: Ability to maintain clinical measurements within normal limits will improve Outcome: Progressing

## 2023-09-26 ENCOUNTER — Inpatient Hospital Stay (HOSPITAL_COMMUNITY)

## 2023-09-26 ENCOUNTER — Other Ambulatory Visit (HOSPITAL_COMMUNITY): Payer: Self-pay | Admitting: Radiology

## 2023-09-26 DIAGNOSIS — K5720 Diverticulitis of large intestine with perforation and abscess without bleeding: Secondary | ICD-10-CM

## 2023-09-26 DIAGNOSIS — I4891 Unspecified atrial fibrillation: Secondary | ICD-10-CM | POA: Diagnosis not present

## 2023-09-26 DIAGNOSIS — E66811 Obesity, class 1: Secondary | ICD-10-CM

## 2023-09-26 DIAGNOSIS — I10 Essential (primary) hypertension: Secondary | ICD-10-CM | POA: Diagnosis not present

## 2023-09-26 DIAGNOSIS — K5792 Diverticulitis of intestine, part unspecified, without perforation or abscess without bleeding: Secondary | ICD-10-CM

## 2023-09-26 DIAGNOSIS — D649 Anemia, unspecified: Secondary | ICD-10-CM

## 2023-09-26 DIAGNOSIS — K632 Fistula of intestine: Secondary | ICD-10-CM | POA: Diagnosis not present

## 2023-09-26 DIAGNOSIS — K5732 Diverticulitis of large intestine without perforation or abscess without bleeding: Secondary | ICD-10-CM | POA: Diagnosis not present

## 2023-09-26 DIAGNOSIS — N1831 Chronic kidney disease, stage 3a: Secondary | ICD-10-CM | POA: Diagnosis not present

## 2023-09-26 LAB — CBC
HCT: 27 % — ABNORMAL LOW (ref 36.0–46.0)
Hemoglobin: 8.5 g/dL — ABNORMAL LOW (ref 12.0–15.0)
MCH: 30.4 pg (ref 26.0–34.0)
MCHC: 31.5 g/dL (ref 30.0–36.0)
MCV: 96.4 fL (ref 80.0–100.0)
Platelets: 277 10*3/uL (ref 150–400)
RBC: 2.8 MIL/uL — ABNORMAL LOW (ref 3.87–5.11)
RDW: 14.4 % (ref 11.5–15.5)
WBC: 8.5 10*3/uL (ref 4.0–10.5)
nRBC: 0 % (ref 0.0–0.2)

## 2023-09-26 LAB — GLUCOSE, CAPILLARY
Glucose-Capillary: 111 mg/dL — ABNORMAL HIGH (ref 70–99)
Glucose-Capillary: 104 mg/dL — ABNORMAL HIGH (ref 70–99)
Glucose-Capillary: 92 mg/dL (ref 70–99)

## 2023-09-26 LAB — AEROBIC/ANAEROBIC CULTURE W GRAM STAIN (SURGICAL/DEEP WOUND)

## 2023-09-26 LAB — BASIC METABOLIC PANEL WITH GFR
Anion gap: 8 (ref 5–15)
BUN: 13 mg/dL (ref 8–23)
CO2: 24 mmol/L (ref 22–32)
Calcium: 8.3 mg/dL — ABNORMAL LOW (ref 8.9–10.3)
Chloride: 101 mmol/L (ref 98–111)
Creatinine, Ser: 1.16 mg/dL — ABNORMAL HIGH (ref 0.44–1.00)
GFR, Estimated: 48 mL/min — ABNORMAL LOW (ref >60)
Glucose, Bld: 85 mg/dL (ref 70–99)
Potassium: 4 mmol/L (ref 3.5–5.1)
Sodium: 133 mmol/L — ABNORMAL LOW (ref 135–145)

## 2023-09-26 LAB — MAGNESIUM: Magnesium: 1.7 mg/dL (ref 1.7–2.4)

## 2023-09-26 MED ORDER — IOHEXOL 300 MG/ML  SOLN
100.0000 mL | Freq: Once | INTRAMUSCULAR | Status: AC | PRN
  Administered 2023-09-26: 100 mL via INTRAVENOUS

## 2023-09-26 MED ORDER — MAGNESIUM SULFATE 2 GM/50ML IV SOLN
2.0000 g | Freq: Once | INTRAVENOUS | Status: AC
  Administered 2023-09-26: 2 g via INTRAVENOUS
  Filled 2023-09-26: qty 50

## 2023-09-26 NOTE — Evaluation (Signed)
 Physical Therapy Evaluation Patient Details Name: Felicia Frank MRN: [id number] DOB: [birth year] Today's Date: 09/26/2023  History of Present Illness  Felicia Frank is a 78 y.o. female with medical history significant of Hypothyroidism, htn, CKD stage1. She presents with decreased appetite, FTT for 1 month with decreasing weight -she states that she is lost 20 pounds over the last month.  She has had very little appetite.  She did have a fall a couple of days ago and has had some back pain since then.  No muscle weakness, incontinence, urinary retention.   Clinical Impression  Patient has difficulty moving legs on to/off bed due to increasing low back pain, demonstrates fair/good carryover for rolling to side and sitting up from side lying position and good return for using RW during gait training in hallway without loss of balance. Patient requested to go back to bed after therapy with her sister present. Patient will benefit from continued skilled physical therapy in hospital and recommended venue below to increase strength, balance, endurance for safe ADLs and gait.         If plan is discharge home, recommend the following: A little help with walking and/or transfers;A little help with bathing/dressing/bathroom;Assistance with cooking/housework;Help with stairs or ramp for entrance   Can travel by private vehicle        Equipment Recommendations Hospital bed;BSC/3in1  Recommendations for Other Services       Functional Status Assessment Patient has had a recent decline in their functional status and demonstrates the ability to make significant improvements in function in a reasonable and predictable amount of time.     Precautions / Restrictions Precautions Precautions: Fall Restrictions Weight Bearing Restrictions Per Provider Order: No      Mobility  Bed Mobility Overal bed mobility: Needs Assistance Bed Mobility: Rolling, Sidelying to Sit, Sit to Sidelying Rolling: Min  assist Sidelying to sit: Min assist     Sit to sidelying: Min assist General bed mobility comments: has diffiuclty moving legs onto/off of bed due to weakness, low back pain    Transfers Overall transfer level: Needs assistance Equipment used: Rolling walker (2 wheels) Transfers: Bed to chair/wheelchair/BSC, Sit to/from Stand Sit to Stand: Supervision   Step pivot transfers: Supervision       General transfer comment: slightly labored movement    Ambulation/Gait Ambulation/Gait assistance: Supervision Gait Distance (Feet): 80 Feet Assistive device: Rolling walker (2 wheels) Gait Pattern/deviations: Decreased step length - left, Decreased stance time - right, Decreased stride length Gait velocity: decreased     General Gait Details: slightly labored movement with good return for ambulating in room, hallway without loss of balance  Stairs            Wheelchair Mobility     Tilt Bed    Modified Rankin (Stroke Patients Only)       Balance Overall balance assessment: Needs assistance Sitting-balance support: Feet supported, No upper extremity supported Sitting balance-Leahy Scale: Good Sitting balance - Comments: seated at EOB   Standing balance support: During functional activity, Bilateral upper extremity supported Standing balance-Leahy Scale: Fair Standing balance comment: fair/good using RW                             Pertinent Vitals/Pain Pain Assessment Pain Assessment: Faces Faces Pain Scale: Hurts little more Pain Location: low back Pain Descriptors / Indicators: Sore Pain Intervention(s): Limited activity within patient's tolerance, Monitored during session, Repositioned    Home Living  Family/patient expects to be discharged to:: Private residence Living Arrangements: Spouse/significant other Available Help at Discharge: Family;Available PRN/intermittently Type of Home: House Home Access: Stairs to enter Entrance Stairs-Rails:  None Entrance Stairs-Number of Steps: 1   Home Layout: One level;Laundry or work area in basement;Able to live on main level with bedroom/bathroom;Full bath on main level;Other (Comment) (Patient does not go into basement) Home Equipment: Rolling Walker (2 wheels)      Prior Function Prior Level of Function : Independent/Modified Independent;Working/employed;Driving             Mobility Comments: Community ambulation without AD, drives, shops ADLs Comments: Independent     Extremity/Trunk Assessment   Upper Extremity Assessment Upper Extremity Assessment: Overall WFL for tasks assessed    Lower Extremity Assessment Lower Extremity Assessment: Generalized weakness    Cervical / Trunk Assessment Cervical / Trunk Assessment: Normal  Communication   Communication Communication: No apparent difficulties    Cognition Arousal: Alert Behavior During Therapy: WFL for tasks assessed/performed   PT - Cognitive impairments: No apparent impairments                         Following commands: Intact       Cueing Cueing Techniques: Verbal cues     General Comments      Exercises     Assessment/Plan    PT Assessment Patient needs continued PT services  PT Problem List Decreased strength;Decreased activity tolerance;Decreased balance;Decreased mobility       PT Treatment Interventions DME instruction;Gait training;Stair training;Functional mobility training;Therapeutic activities;Therapeutic exercise;Balance training;Patient/family education    PT Goals (Current goals can be found in the Care Plan section)  Acute Rehab PT Goals Patient Stated Goal: return home with family to assist PT Goal Formulation: With family Time For Goal Achievement: 10/02/23 Potential to Achieve Goals: Good    Frequency Min 3X/week     Co-evaluation               AM-PAC PT "6 Clicks" Mobility  Outcome Measure Help needed turning from your back to your side while in a  flat bed without using bedrails?: A Little Help needed moving from lying on your back to sitting on the side of a flat bed without using bedrails?: A Little Help needed moving to and from a bed to a chair (including a wheelchair)?: A Little Help needed standing up from a chair using your arms (e.g., wheelchair or bedside chair)?: None Help needed to walk in hospital room?: A Little Help needed climbing 3-5 steps with a railing? : A Little 6 Click Score: 19    End of Session   Activity Tolerance: Patient tolerated treatment well;Patient limited by fatigue Patient left: in bed;with call bell/phone within reach;with family/visitor present Nurse Communication: Mobility status PT Visit Diagnosis: Unsteadiness on feet (R26.81);Other abnormalities of gait and mobility (R26.89);Muscle weakness (generalized) (M62.81)    Time: 1610-9604 PT Time Calculation (min) (ACUTE ONLY): 20 min   Charges:   PT Evaluation $PT Eval Moderate Complexity: 1 Mod PT Treatments $Therapeutic Activity: 8-22 mins PT General Charges $$ ACUTE PT VISIT: 1 Visit         4:07 PM, 09/26/23 Walton Guppy, MPT Physical Therapist with [facility] [phone] office [phone] mobile phone

## 2023-09-26 NOTE — Progress Notes (Signed)
 Nurse at beside,patient alert and oriented times four.Patient ambulated to bathroom with front wheel walker times one assist,patient is passing gas,but no bowel movement yet.Patient c/o being nauseated this am. Plan of care on going.

## 2023-09-26 NOTE — Assessment & Plan Note (Signed)
 Continue pantoprazole.

## 2023-09-26 NOTE — Assessment & Plan Note (Addendum)
 Patient had RVR that required diltiazem  infusion.  Currently rate is better controlled with metoprolol  and oral amiodarone .  Today with bradycardia with HR in mid to high 50's, reduced metoprolol  to 50 mg daily,   Continue to hold anticoagulation due to possible surgical intervention to the abdomen.

## 2023-09-26 NOTE — Hospital Course (Addendum)
 Felicia Frank was admitted to the hospital with the working diagnosis of acute sigmoid diverticulitis with colo-enteric fistula.   78 y.o. female with medical history significant of Hypothyroidism, HTN, CKD stage 3A, and GERD, who presented with failure to thrive with 20 pound weight loss.    Repeat CT Abdomen on 09/20/2023 with perforated sigmoid diverticulitis and organizing pelvic area fluid/gas collection measuring 6 x 4.4 CM--with coloenteric fistula and small bowel obstruction with transition point within the distal small bowel adjacent to the inflamed sigmoid colon   04/23 IR consulted and drain placement without complications.

## 2023-09-26 NOTE — Assessment & Plan Note (Signed)
 Hyponatremia, hyperkalemia.   Stable renal function with serum cr at 1,19 with K at 4,0 and serum bicarbonate at 23 Na 131 and Mg 1,9   Diety has been advanced.  She is having peripheral edema, continue to hold on IV fluids for now.

## 2023-09-26 NOTE — Progress Notes (Signed)
 Subjective: Patient does have some nausea with clear liquid diet.  Is passing flatus but has not had a bowel movement over the past 24 hours.  Objective: Vital signs in last 24 hours: Temp:  [97.6 F (36.4 C)-98.5 F (36.9 C)] 98.3 F (36.8 C) (04/28 0441) Pulse Rate:  [54-75] 66 (04/28 0441) Resp:  [13-20] 20 (04/27 1228) BP: (117-149)/(39-107) 144/61 (04/28 0441) SpO2:  [96 %-100 %] 96 % (04/28 0441) Last BM Date : 09/23/23  Intake/Output from previous day: 04/27 0701 - 04/28 0700 In: 823.4 [P.O.:240; I.V.:311.5; IV Piggyback:271.9] Out: 150 [Urine:150] Intake/Output this shift: No intake/output data recorded.  General appearance: alert, cooperative, and no distress GI: Soft, nontender, nondistended.  Minimal bowel sounds appreciated.  JP drainage minimal.  Lab Results:  Recent Labs    09/25/23 0428 09/26/23 0410  WBC 9.4 8.5  HGB 8.8* 8.5*  HCT 28.0* 27.0*  PLT 250 277   BMET Recent Labs    09/25/23 0428 09/26/23 0410  NA 132* 133*  K 4.1 4.0  CL 102 101  CO2 22 24  GLUCOSE 92 85  BUN 12 13  CREATININE 1.03* 1.16*  CALCIUM 8.0* 8.3*   PT/INR No results for input(s): "LABPROT", "INR" in the last 72 hours.  Studies/Results: No results found.  Anti-infectives: Anti-infectives (From admission, onward)    Start     Dose/Rate Route Frequency Ordered Stop   09/25/23 0900  ciprofloxacin  (CIPRO ) tablet 500 mg        500 mg Oral 2 times daily 09/25/23 0811     09/25/23 0900  metroNIDAZOLE  (FLAGYL ) tablet 500 mg        500 mg Oral Every 8 hours 09/25/23 0811     09/19/23 2200  piperacillin -tazobactam (ZOSYN ) IVPB 3.375 g  Status:  Discontinued        3.375 g 12.5 mL/hr over 240 Minutes Intravenous Every 8 hours 09/19/23 1045 09/25/23 0811   09/18/23 1030  meropenem  (MERREM ) 1 g in sodium chloride  0.9 % 100 mL IVPB  Status:  Discontinued        1 g 200 mL/hr over 30 Minutes Intravenous Every 12 hours 09/18/23 0934 09/19/23 1045   09/18/23 1000   ciprofloxacin  (CIPRO ) IVPB 400 mg  Status:  Discontinued        400 mg 200 mL/hr over 60 Minutes Intravenous Every 24 hours 09/17/23 1140 09/18/23 0833   09/17/23 0600  ciprofloxacin  (CIPRO ) IVPB 400 mg  Status:  Discontinued        400 mg 200 mL/hr over 60 Minutes Intravenous Every 12 hours 09/16/23 1954 09/17/23 1140   09/17/23 0600  metroNIDAZOLE  (FLAGYL ) IVPB 500 mg  Status:  Discontinued        500 mg 100 mL/hr over 60 Minutes Intravenous Every 12 hours 09/16/23 1954 09/18/23 0833   09/16/23 1815  ciprofloxacin  (CIPRO ) IVPB 400 mg        400 mg 200 mL/hr over 60 Minutes Intravenous  Once 09/16/23 1805 09/16/23 2013   09/16/23 1815  metroNIDAZOLE  (FLAGYL ) IVPB 500 mg        500 mg 100 mL/hr over 60 Minutes Intravenous  Once 09/16/23 1805 09/16/23 2014       Assessment/Plan: Impression: Sigmoid diverticulitis with pelvic abscess and coloenteric fistula.  Minimal drainage from JP drain.  Will get follow-up CT scan of the abdomen pelvis to assess for bowel function as well as the pelvic abscess.  Further management pending those results.  Patient does state that she is having  some difficulty tolerating ciprofloxacin  and Flagyl  p.o.  LOS: 10 days    [name] 09/26/2023

## 2023-09-26 NOTE — Assessment & Plan Note (Signed)
 Complicated with colo enteric fistula.  04.22 follow up CT with organizing pelvic area fluid/ gas collection 6 x 4.4 cm with colo enteric fistula and small bowel obstruction with transition point within the distal small bowel adjacent to the inflamed sigmoid colon.   NG was removed.  Patient on antibiotic therapy with ciprofloxacin  and metronidazole    Supportive medical therapy with as needed analgesics and antiemetics.  Wbc 8,5  04/28 CT with right transgluteal percutaneous drain has evacuated the fluid component of the central pelvic diverticular abscess. Remain pocket of air 3,4 cm in diameter.  Stable dilatated small bowel loops containing fluids measuring up to 3.5 cm.   Plan of trial to advance diet, if not toleration she may need surgical interventions, discussed with Dr Larrie Po from surgery.

## 2023-09-26 NOTE — Plan of Care (Signed)
 ?  Problem: Education: ?Goal: Knowledge of General Education information will improve ?Description: Including pain rating scale, medication(s)/side effects and non-pharmacologic comfort measures ?Outcome: Progressing ?  ?Problem: Health Behavior/Discharge Planning: ?Goal: Ability to manage health-related needs will improve ?Outcome: Progressing ?  ?Problem: Coping: ?Goal: Level of anxiety will decrease ?Outcome: Progressing ?  ?

## 2023-09-26 NOTE — Assessment & Plan Note (Signed)
 Cell count has been stable  Continue to hold on anticoagulation

## 2023-09-26 NOTE — Care Management Important Message (Signed)
 Important Message  Patient Details  Name: Felicia Frank MRN: [id number] Date of Birth: Nov 30, 1945   Important Message Given:  Yes - Medicare IM     Heather Mckendree L Terie Lear 09/26/2023, 10:32 AM

## 2023-09-26 NOTE — Plan of Care (Signed)
  Problem: Acute Rehab PT Goals(only PT should resolve) Goal: Pt Will Go Supine/Side To Sit Outcome: Progressing Flowsheets (Taken 09/26/2023 1608) Pt will go Supine/Side to Sit:  with supervision  with contact guard assist Goal: Patient Will Transfer Sit To/From Stand Outcome: Progressing Flowsheets (Taken 09/26/2023 1608) Patient will transfer sit to/from stand: with modified independence Goal: Pt Will Transfer Bed To Chair/Chair To Bed Outcome: Progressing Flowsheets (Taken 09/26/2023 1608) Pt will Transfer Bed to Chair/Chair to Bed: with modified independence Goal: Pt Will Ambulate Outcome: Progressing Flowsheets (Taken 09/26/2023 1608) Pt will Ambulate:  > 125 feet  with modified independence  with rolling walker   4:09 PM, 09/26/23 Walton Guppy, MPT Physical Therapist with [facility] [phone] office [phone] mobile phone

## 2023-09-26 NOTE — Assessment & Plan Note (Signed)
Calculated BMI is 32.2  

## 2023-09-26 NOTE — Assessment & Plan Note (Signed)
Continue pain control 

## 2023-09-26 NOTE — Progress Notes (Signed)
 Progress Note   Patient: Felicia Frank WJX:[id number] DOB: 1945/12/18 DOA: 09/16/2023     10 DOS: the patient was seen and examined on 09/26/2023   Brief hospital course: Mrs. Felicia Frank was admitted to the hospital with the working diagnosis of acute sigmoid diverticulitis with colo-enteric fistula.   77 y.o. female with medical history significant of Hypothyroidism, HTN, CKD stage 3A, and GERD, who presented with failure to thrive with 20 pound weight loss.    Repeat CT Abdomen on 09/20/2023 with perforated sigmoid diverticulitis and organizing pelvic area fluid/gas collection measuring 6 x 4.4 CM--with coloenteric fistula and small bowel obstruction with transition point within the distal small bowel adjacent to the inflamed sigmoid colon   04/23 IR consulted and drain placement without complications.  Assessment and Plan: * Diverticulitis of colon with perforation Complicated with colo enteric fistula.  04.22 follow up CT with organizing pelvic area fluid/ gas collection 6 x 4.4 cm with colo enteric fistula and small bowel obstruction with transition point within the distal small bowel adjacent to the inflamed sigmoid colon.   NG was removed.  Patient on antibiotic therapy with ciprofloxacin  and metronidazole    Supportive medical therapy with as needed analgesics and antiemetics.  Wbc 8,5  04/28 CT with right transgluteal percutaneous drain has evacuated the fluid component of the central pelvic diverticular abscess. Remain pocket of air 3,4 cm in diameter.  Stable dilatated small bowel loops containing fluids measuring up to 3.5 cm.   Plan of trial to advance diet, if not toleration she may need surgical interventions, discussed with Dr Felicia Frank from surgery.    Atrial fibrillation, new onset (HCC) Patient had RVR that required diltiazem  infusion.  Currently rate is better controlled with metoprolol  and oral amiodarone .  Continue telemetry monitoring Continue to hold  anticoagulation in preparation for possible surgical intervention to the abdomen.   Essential hypertension Continue blood pressure monitoring.  Off blood pressure medications at this point.   Stage 3a chronic kidney disease (HCC) Hyponatremia, hyperkalemia.   Renal function with serum cr at 1,16 with K at 4,0 and serum bicarbonate at 24  Na 133 and Mg 1,7   Plan to add 2 g mag sulfate and continue close monitoring of electrolytes.  Diety has been advanced.  She is having peripheral edema, continue to hold on IV fluids for now.    Hypothyroidism Continue levothyroxine    Gastroesophageal reflux disease Continue pantoprazole   Compression fracture of L1 lumbar vertebra (HCC) Continue pain control.   Chronic anemia Cell count has been stable  Continue to hold on anticoagulation   Obesity, class 1 Calculated BMI is 32.2         Subjective: Patient continue to have abdominal pain and nausea, no bowel movement since Friday,   Physical Exam: Vitals:   09/26/23 0026 09/26/23 0441 09/26/23 0905 09/26/23 1439  BP: 136/60 (!) 144/61 121/73 131/62  Pulse: 63 66 91 (!) 55  Resp:    20  Temp: 98.5 F (36.9 C) 98.3 F (36.8 C) 98.7 F (37.1 C) 98.2 F (36.8 C)  TempSrc: Oral Oral Oral Oral  SpO2: 97% 96% 99% 97%  Weight:      Height:       Neurology awake and alert, deconditioned and ill looking appearing ENT with mild pallor Cardiovascular with S1 and S2 present and regular with no gallops, rubs or murmurs Respiratory with no rales or wheezing, no rhonchi  Abdomen with distention and tender to deep palpation, with no rebound or guarding  No lower extremity edema  Data Reviewed:    Family Communication: I spoke with patient's sister at the bedside, we talked in detail about patient's condition, plan of care and prognosis and all questions were addressed.   Disposition: Status is: Inpatient Remains inpatient appropriate because: IV antibiotics and surgery follow  up   Planned Discharge Destination: Home      Author: Albertus Alt, MD 09/26/2023 4:20 PM  For on call review [email or web address].

## 2023-09-26 NOTE — Discharge Instructions (Signed)
If patient is to be discharged, below are discharge instructions: - Flush each drain once daily with 5-10 cc NS flush (patient will need order for flushes upon discharge). - Record output from each drain once daily. - Change dressing every 3-5 days or sooner if needed - No showering or submerging (swimming, bathing) with drain in place. Ok to sponge bath around drain. - Follow-up at drain clinic 10-14 days after discharge for CT/possible drain injection (assess for possible drain removal)- order placed to facilitate this.

## 2023-09-26 NOTE — Assessment & Plan Note (Signed)
 Continue blood pressure monitoring.  Off blood pressure medications at this point.

## 2023-09-26 NOTE — Assessment & Plan Note (Signed)
 Continue levothyroxine

## 2023-09-26 NOTE — Progress Notes (Signed)
 Nurse at bedside,patient having n/v at this time,patient assisted to and from bathroom,back to bed. Cool wash cloth provided for comfort.Plan of care on going.Family at bedside.

## 2023-09-27 DIAGNOSIS — K5720 Diverticulitis of large intestine with perforation and abscess without bleeding: Secondary | ICD-10-CM | POA: Diagnosis not present

## 2023-09-27 DIAGNOSIS — I4891 Unspecified atrial fibrillation: Secondary | ICD-10-CM | POA: Diagnosis not present

## 2023-09-27 DIAGNOSIS — N1831 Chronic kidney disease, stage 3a: Secondary | ICD-10-CM | POA: Diagnosis not present

## 2023-09-27 DIAGNOSIS — I10 Essential (primary) hypertension: Secondary | ICD-10-CM | POA: Diagnosis not present

## 2023-09-27 LAB — BASIC METABOLIC PANEL WITH GFR
Anion gap: 8 (ref 5–15)
BUN: 13 mg/dL (ref 8–23)
CO2: 23 mmol/L (ref 22–32)
Calcium: 8.3 mg/dL — ABNORMAL LOW (ref 8.9–10.3)
Chloride: 100 mmol/L (ref 98–111)
Creatinine, Ser: 1.19 mg/dL — ABNORMAL HIGH (ref 0.44–1.00)
GFR, Estimated: 47 mL/min — ABNORMAL LOW (ref >60)
Glucose, Bld: 79 mg/dL (ref 70–99)
Potassium: 4 mmol/L (ref 3.5–5.1)
Sodium: 131 mmol/L — ABNORMAL LOW (ref 135–145)

## 2023-09-27 LAB — CBC WITH DIFFERENTIAL/PLATELET
Abs Immature Granulocytes: 0.08 10*3/uL — ABNORMAL HIGH (ref 0.00–0.07)
Basophils Absolute: 0 10*3/uL (ref 0.0–0.1)
Basophils Relative: 0 %
Eosinophils Absolute: 0.2 10*3/uL (ref 0.0–0.5)
Eosinophils Relative: 2 %
HCT: 26.4 % — ABNORMAL LOW (ref 36.0–46.0)
Hemoglobin: 8.5 g/dL — ABNORMAL LOW (ref 12.0–15.0)
Immature Granulocytes: 1 %
Lymphocytes Relative: 10 %
Lymphs Abs: 0.8 10*3/uL (ref 0.7–4.0)
MCH: 31.1 pg (ref 26.0–34.0)
MCHC: 32.2 g/dL (ref 30.0–36.0)
MCV: 96.7 fL (ref 80.0–100.0)
Monocytes Absolute: 0.6 10*3/uL (ref 0.1–1.0)
Monocytes Relative: 8 %
Neutro Abs: 5.9 10*3/uL (ref 1.7–7.7)
Neutrophils Relative %: 79 %
Platelets: 320 10*3/uL (ref 150–400)
RBC: 2.73 MIL/uL — ABNORMAL LOW (ref 3.87–5.11)
RDW: 14.6 % (ref 11.5–15.5)
WBC: 7.6 10*3/uL (ref 4.0–10.5)
nRBC: 0 % (ref 0.0–0.2)

## 2023-09-27 LAB — MAGNESIUM: Magnesium: 1.9 mg/dL (ref 1.7–2.4)

## 2023-09-27 LAB — GLUCOSE, CAPILLARY
Glucose-Capillary: 135 mg/dL — ABNORMAL HIGH (ref 70–99)
Glucose-Capillary: 83 mg/dL (ref 70–99)
Glucose-Capillary: 84 mg/dL (ref 70–99)
Glucose-Capillary: 92 mg/dL (ref 70–99)

## 2023-09-27 MED ORDER — SENNOSIDES-DOCUSATE SODIUM 8.6-50 MG PO TABS
2.0000 | ORAL_TABLET | Freq: Two times a day (BID) | ORAL | Status: DC
  Administered 2023-09-27 – 2023-09-28 (×3): 2 via ORAL
  Filled 2023-09-27 (×3): qty 2

## 2023-09-27 MED ORDER — SODIUM CHLORIDE 0.9 % IV SOLN
12.5000 mg | Freq: Four times a day (QID) | INTRAVENOUS | Status: DC | PRN
  Administered 2023-09-27 – 2023-09-28 (×2): 12.5 mg via INTRAVENOUS
  Filled 2023-09-27: qty 0.5

## 2023-09-27 MED ORDER — MAGNESIUM HYDROXIDE 400 MG/5ML PO SUSP
30.0000 mL | Freq: Once | ORAL | Status: AC
  Administered 2023-09-27: 30 mL via ORAL
  Filled 2023-09-27: qty 30

## 2023-09-27 NOTE — Progress Notes (Signed)
 Subjective: Patient continues to pass flatus.  Has not had a bowel movement yet.  Is tolerating various soft foods okay.  No nausea or vomiting noted.  Objective: Vital signs in last 24 hours: Temp:  [98 F (36.7 C)-98.7 F (37.1 C)] 98.7 F (37.1 C) (04/29 4098) Pulse Rate:  [55-72] 72 (04/29 0608) Resp:  [16-20] 16 (04/29 0608) BP: (123-138)/(54-62) 123/62 (04/29 0608) SpO2:  [96 %-98 %] 96 % (04/29 0608) Last BM Date : 09/23/23  Intake/Output from previous day: 04/28 0701 - 04/29 0700 In: 240 [P.O.:240] Out: -  Intake/Output this shift: No intake/output data recorded.  General appearance: alert, cooperative, and no distress GI: Soft with minimal discomfort to deep palpation in the lower abdomen.  Bowel sounds present.  JP drainage minimal.  Lab Results:  Recent Labs    09/26/23 0410 09/27/23 0354  WBC 8.5 7.6  HGB 8.5* 8.5*  HCT 27.0* 26.4*  PLT 277 320   BMET Recent Labs    09/26/23 0410 09/27/23 0354  NA 133* 131*  K 4.0 4.0  CL 101 100  CO2 24 23  GLUCOSE 85 79  BUN 13 13  CREATININE 1.16* 1.19*  CALCIUM 8.3* 8.3*   PT/INR No results for input(s): "LABPROT", "INR" in the last 72 hours.  Studies/Results: CT ABDOMEN PELVIS W CONTRAST Result Date: 09/26/2023 CLINICAL DATA:  Sigmoid diverticulitis with abscess. Status post percutaneous catheter drainage of diverticular abscess on 09/21/2023. EXAM: CT ABDOMEN AND PELVIS WITH CONTRAST TECHNIQUE: Multidetector CT imaging of the abdomen and pelvis was performed using the standard protocol following bolus administration of intravenous contrast. RADIATION DOSE REDUCTION: This exam was performed according to the departmental dose-optimization program which includes automated exposure control, adjustment of the mA and/or kV according to patient size and/or use of iterative reconstruction technique. CONTRAST:  OMNIPAQUE  IOHEXOL  300 MG/ML  SOLN COMPARISON:  Prior CT on 09/20/2023 and imaging at the time abscess  drainage on 09/21/2023. FINDINGS: Lower chest: Trace bilateral pleural effusions. Hepatobiliary: No focal liver abnormality is seen. Status post cholecystectomy. No biliary dilatation. Pancreas: Stable atrophic pancreas. No pancreatic ductal dilatation or surrounding inflammatory changes. Spleen: Normal in size without focal abnormality. Adrenals/Urinary Tract: Adrenal glands are unremarkable. Kidneys are normal, without renal calculi, focal lesion, or hydronephrosis. Bladder is unremarkable. Stomach/Bowel: Stable dilated small bowel loops containing fluid measuring up to 3.5 cm. Findings are consistent with persistent ileus/partial small bowel obstruction. No free air. Decrease in free fluid within the pelvis. Right transgluteal percutaneous drain has evacuated the fluid component the central pelvic diverticular abscess. Pocket of air anterior to the drainage catheter remains measuring roughly 3.4 cm in diameter. No new abscess identified. Vascular/Lymphatic: Stable aortic atherosclerosis without aneurysm. No enlarged abdominal or pelvic lymph nodes. Reproductive: Status post hysterectomy. No adnexal masses. Other: No hernias.  Stable mild edema/anasarca of the body wall. 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 IMPRESSION: 1. Right transgluteal percutaneous drain has evacuated the fluid component of the central pelvic diverticular abscess. Pocket of air anterior to the drainage catheter remains measuring roughly 3.4 cm in diameter. No new abscess identified. 2. Stable dilated small bowel loops containing fluid measuring up to 3.5 cm. Findings are consistent with persistent ileus/partial small bowel obstruction. 3. Stable mild edema/anasarca of the body wall. 4. Trace bilateral pleural effusions. 5. Stable aortic atherosclerosis. 6. 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  Electronically Signed   By: Erica Hau M.D.   On: 09/26/2023 13:15    Anti-infectives: Anti-infectives (From admission, onward)    Start     Dose/Rate Route Frequency Ordered Stop   09/25/23 0900  ciprofloxacin  (CIPRO ) tablet 500 mg        500 mg Oral 2 times daily 09/25/23 0811     09/25/23 0900  metroNIDAZOLE  (FLAGYL ) tablet 500 mg        500 mg Oral Every 8 hours 09/25/23 0811     09/19/23 2200  piperacillin -tazobactam (ZOSYN ) IVPB 3.375 g  Status:  Discontinued        3.375 g 12.5 mL/hr over 240 Minutes Intravenous Every 8 hours 09/19/23 1045 09/25/23 0811   09/18/23 1030  meropenem  (MERREM ) 1 g in sodium chloride  0.9 % 100 mL IVPB  Status:  Discontinued        1 g 200 mL/hr over 30 Minutes Intravenous Every 12 hours 09/18/23 0934 09/19/23 1045   09/18/23 1000  ciprofloxacin  (CIPRO ) IVPB 400 mg  Status:  Discontinued        400 mg 200 mL/hr over 60 Minutes Intravenous Every 24 hours 09/17/23 1140 09/18/23 0833   09/17/23 0600  ciprofloxacin  (CIPRO ) IVPB 400 mg  Status:  Discontinued        400 mg 200 mL/hr over 60 Minutes Intravenous Every 12 hours 09/16/23 1954 09/17/23 1140   09/17/23 0600  metroNIDAZOLE  (FLAGYL ) IVPB 500 mg  Status:  Discontinued        500 mg 100 mL/hr over 60 Minutes Intravenous Every 12 hours 09/16/23 1954 09/18/23 0833   09/16/23 1815  ciprofloxacin  (CIPRO ) IVPB 400 mg        400 mg 200 mL/hr over 60 Minutes Intravenous  Once 09/16/23 1805 09/16/23 2013   09/16/23 1815  metroNIDAZOLE  (FLAGYL ) IVPB 500 mg        500 mg 100 mL/hr over 60 Minutes Intravenous  Once 09/16/23 1805 09/16/23 2014       Assessment/Plan: Impression: 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  Awaiting full return of bowel function. Plan: Will add Senokot to bowel regimen.  Anticipate discharge when his bowel function fully returns.  LOS: 11 days    [name] 09/27/2023

## 2023-09-27 NOTE — Progress Notes (Signed)
 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

## 2023-09-27 NOTE — Progress Notes (Signed)
 Progress Note   Patient: Felicia Frank ZOX:[id number] DOB: 1946-03-26 DOA: 09/16/2023     11 DOS: the patient was seen and examined on 09/27/2023   Brief hospital course: Felicia Frank was admitted to the hospital with the working diagnosis of acute sigmoid diverticulitis complicated with colo-enteric fistula.   78 y.o. female with medical history significant of Hypothyroidism, HTN, CKD stage 3A, and GERD, who presented with decreased appetite and 20 pound weight loss over the last month prior to admission. Positive falls at home. Positive nausea and vomiting. Her family decided to bring her to the hospital. On her initial physical examination her blood pressure was 159/92, HR 70 and 02 saturation 91% Lungs with no wheezing or rales, heart with S1 and S2 present and regular with no gallops, abdomen with diffuse tenderness, and mild guarding, no rebound, positive lower extremity edema.   CT abdomen and pelvis with positive acute sigmoid diverticulitis with colo-enteric fistula. New superior endplate compression fracture of L1.   Patient was placed on IV fluids and IV antibiotics NG tube was placed for decompression.  Surgery was consulted.   4/22 repeat CT abdomen and pelvis with perforated sigmoid diverticulitis and organizing pelvic area fluid/gas collection measuring 6 x 4.4 CM--with coloenteric fistula and small bowel obstruction with transition point within the distal small bowel adjacent to the inflamed sigmoid colon   04/23 IR consulted and drain placement without complications. 04/28 CT abdomen and pelvis right transgluteal percutaneous drain has evacuated the fluid component of the central pelvic diverticular abscess. Pocket of air anterior of the drainage catheter remains measuring roughly 3,4 cm in diameter. No new abscess.  Stable dilatated small bowel loops containing fluid measuring up to 3,5 cm. Findings consistent with persistent ileus/ partial small bowel obstruction,   04/29  patient placed on bowel regimen, and soft diet, continue with no bowel movement.   Assessment and Plan: * Diverticulitis of colon with perforation Complicated with colo enteric fistula and ileus  Intra abdominal abscess (sp drainage, percutaneous drain in place) CT imaging with ileus as above   Patient on antibiotic therapy with ciprofloxacin  and metronidazole    Supportive medical therapy with as needed analgesics and antiemetics.  Wbc 7.6   Antibiotic therapy with ciprofloxacin  and metronidazole .  Patient now on soft diet, and placed on bowel regimen, pending recovery of bowel function. Follow up with surgery recommendations.   Atrial fibrillation, new onset (HCC) Patient had RVR that required diltiazem  infusion.  Currently rate is better controlled with metoprolol  and oral amiodarone .  Today with bradycardia with HR in mid to high 50's, reduced metoprolol  to 50 mg daily,   Continue to hold anticoagulation due to possible surgical intervention to the abdomen.   Essential hypertension Continue blood pressure monitoring.  Dose of metoprolol  reduced due to risk of bradycardia.   Stage 3a chronic kidney disease (HCC) Hyponatremia, hyperkalemia.   Stable renal function with serum cr at 1,19 with K at 4,0 and serum bicarbonate at 23 Na 131 and Mg 1,9   Diety has been advanced.  She is having peripheral edema, continue to hold on IV fluids for now.   Hypothyroidism Continue levothyroxine    Gastroesophageal reflux disease Continue pantoprazole   Compression fracture of L1 lumbar vertebra (HCC) Continue pain control.   Chronic anemia Cell count has been stable  Continue to hold on anticoagulation   Obesity, class 1 Calculated BMI is 32.2         Subjective: Patient is not feeling well, continue to have nausea  and abdominal discomfort. No chest pain or dyspnea, continue to have lower extremity edema, no bowel movement so far.   Physical Exam: Vitals:   09/26/23  2056 09/27/23 0608 09/27/23 0935 09/27/23 1424  BP: (!) 138/54 123/62 (!) 142/63 109/78  Pulse: 67 72 67 68  Resp:  16    Temp: 98 F (36.7 C) 98.7 F (37.1 C)  98.2 F (36.8 C)  TempSrc: Oral Oral  Oral  SpO2: 98% 96%  98%  Weight:      Height:       Neurology awake and alert, deconditioned and ill looking appearing ENT with positive pallor with no icterus Cardiovascular with S1 and S2 present and regular with no gallops, rubs or murmurs Respiratory with no rales or wheezing, no rhonchi Abdomen with mildly distended, but soft and non tender, no rebound or guarding  Positive lower extremity edema ++ pitting bilaterally  Data Reviewed:    Family Communication: I spoke with patient's husband and daughter at the bedside, we talked in detail about patient's condition, plan of care and prognosis and all questions were addressed.   Disposition: Status is: Inpatient Remains inpatient appropriate because: pending to recover bowel function   Planned Discharge Destination: Home      Author: Albertus Alt, MD 09/27/2023 2:27 PM  For on call review [email or web address].

## 2023-09-27 NOTE — TOC Initial Note (Deleted)
 Transition of Care [facility]) - Initial/Assessment Note    Patient Details  Name: Felicia Frank MRN: [id number] Date of Birth: 05-15-1946  [facility]) CM/SW Contact:    Linnea Richards, LCSW Phone Number: 09/27/2023, 3:10 PM  Clinical Narrative:                  Pt from home with anticipated dc soon. PT/OT recommending HH at dc along with 3in1 and pt requesting hospital bed.  Spoke with pt and family at bedside to review dc planning. CMS provider options that serve [place] reviewed. Referrals made as requested.  [facility] will follow at dc. [facility] to provide DME. Requested clinical information faxed to each agency.  TOC will follow and continue to assist with dc planning.   Expected Discharge Plan: Home w Home Health Services Barriers to Discharge: Continued Medical Work up   Patient Goals and CMS Choice Patient states their goals for this hospitalization and ongoing recovery are:: return home CMS Medicare.gov Compare Post Acute Care list provided to:: Patient Choice offered to / list presented to : Patient      Expected Discharge Plan and Services In-house Referral: Clinical Social Work   Post Acute Care Choice: Durable Medical Equipment, Home Health Living arrangements for the past 2 months: Single Family Home                 DME Arranged: 3-N-1, Hospital bed DME Agency: Other - Comment Advertising account executive) Date DME Agency Contacted: 09/27/23                Prior Living Arrangements/Services Living arrangements for the past 2 months: Single Family Home Lives with:: Spouse                   Activities of Daily Living   ADL Screening (condition at time of admission) Independently performs ADLs?: Yes (appropriate for developmental age) Is the patient deaf or have difficulty hearing?: No Does the patient have difficulty seeing, even when wearing glasses/contacts?: No Does the patient have difficulty concentrating, remembering, or making  decisions?: No  Permission Sought/Granted                  Emotional Assessment              Admission diagnosis:  Diverticulitis [K57.92] Abdominal pain, unspecified abdominal location [R10.9] Compression fracture of L1 vertebra, initial encounter (HCC) [S32.010A] 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 [K63.2, K57.32] Patient Active Problem List   Diagnosis Date Noted   Chronic anemia 09/26/2023   Obesity, 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   Atrial fibrillation, new onset (HCC) 09/18/2023   Compression fracture of L1 lumbar vertebra (HCC) 09/16/2023   Diverticulitis of colon with perforation 09/16/2023   Hyponatremia 09/16/2023   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   Normocytic anemia 10/07/2021   Constipation 10/07/2021   Dysphagia 10/07/2021   History of rectal bleeding 02/24/2021   Stage 3a chronic kidney disease (HCC) 06/09/2020   Gastroesophageal reflux disease 11/13/2019   Osteoarthritis 04/20/2019   Essential hypertension 04/20/2019   Hypothyroidism 04/20/2019   PCP:  Allana Ishikawa, MD Pharmacy:   [facility] [phone] [place], [place] - [address] [facility] [address] [place] [address] Phone: [phone] Fax: [phone]     Social Drivers of Health (SDOH) Social History: SDOH Screenings   Food Insecurity: No Food Insecurity (09/16/2023)  Housing: Low Risk  (09/16/2023)  Transportation Needs: No Transportation  Needs (09/16/2023)  Utilities: Not At Risk (09/16/2023)  Social Connections: Socially Integrated (09/16/2023)  Tobacco Use: Low Risk  (09/16/2023)   SDOH Interventions:     Readmission Risk Interventions    09/16/2023   10:04 PM 09/16/2023    9:30 PM  Readmission Risk Prevention Plan  Post Dischage Appt Complete Complete  Medication Screening Complete Complete  Transportation Screening Complete Complete

## 2023-09-27 NOTE — TOC Initial Note (Signed)
 Transition of Care [facility]) - Initial/Assessment Note    Patient Details  Name: Felicia Frank MRN: [id number] Date of Birth: 28-Mar-1946  Transition of Care [facility]) CM/SW Contact:    Linnea Richards, LCSW Phone Number: 09/27/2023, 3:13 PM  Clinical Narrative:                  Pt from home with anticipated dc soon. PT/OT recommending HH at dc along with 3in1 and pt requesting hospital bed.   Spoke with pt and family at bedside to review dc planning. CMS provider options that serve [place] reviewed. Referrals made as requested.   [facility] will follow at dc. [facility] to provide DME. Requested clinical information faxed to each agency.   TOC will follow and continue to assist with dc planning.     Expected Discharge Plan: Home w Home Health Services Barriers to Discharge: Continued Medical Work up   Patient Goals and CMS Choice Patient states their goals for this hospitalization and ongoing recovery are:: return home CMS Medicare.gov Compare Post Acute Care list provided to:: Patient Choice offered to / list presented to : Patient      Expected Discharge Plan and Services In-house Referral: Clinical Social Work   Post Acute Care Choice: Durable Medical Equipment, Home Health Living arrangements for the past 2 months: Single Family Home                 DME Arranged: 3-N-1, Hospital bed DME Agency: Other - Comment Advertising account executive) Date DME Agency Contacted: 09/27/23     HH Arranged: RN, PT, OT HH Agency: [facility] Date HH Agency Contacted: 09/27/23   Representative spoke with at [facility] Agency: Tam[facility]  Prior Living Arrangements/Services Living arrangements for the past 2 months: Single Family Home Lives with:: Spouse Patient language and need for interpreter reviewed:: Yes Do you feel safe going back to the place where you live?: Yes      Need for Family Participation in Patient Care: No (Comment) Care giver support system in place?: Yes (comment)   Criminal  Activity/Legal Involvement Pertinent to Current Situation/Hospitalization: No - Comment as needed  Activities of Daily Living   ADL Screening (condition at time of admission) Independently performs ADLs?: Yes (appropriate for developmental age) Is the patient deaf or have difficulty hearing?: No Does the patient have difficulty seeing, even when wearing glasses/contacts?: No Does the patient have difficulty concentrating, remembering, or making decisions?: No  Permission Sought/Granted Permission sought to share information with : Facility Industrial/product designer granted to share information with : Yes, Verbal Permission Granted     Permission granted to share info w AGENCY: HH, DME        Emotional Assessment Appearance:: Appears stated age Attitude/Demeanor/Rapport: Engaged Affect (typically observed): Accepting Orientation: : Oriented to Self, Oriented to Place, Oriented to  Time, Oriented to Situation Alcohol / Substance Use: Not Applicable Psych Involvement: No (comment)  Admission diagnosis:  Diverticulitis [K57.92] Abdominal pain, unspecified abdominal location [R10.9] Compression fracture of L1 vertebra, initial encounter (HCC) [S32.010A] 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 [K63.2, K57.32] Patient Active Problem List   Diagnosis Date Noted   Chronic anemia 09/26/2023   Obesity, 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   Atrial fibrillation, new onset (HCC) 09/18/2023   Compression fracture of L1 lumbar vertebra (HCC) 09/16/2023   Diverticulitis of colon with perforation 09/16/2023   Hyponatremia 09/16/2023   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 [place] [address] Phone: [phone] Fax: [phone]     Social Drivers of Health (SDOH) Social History: SDOH Screenings   Food Insecurity: No Food Insecurity (09/16/2023)  Housing: Low Risk  (09/16/2023)  Transportation Needs: No Transportation Needs (09/16/2023)  Utilities: Not At Risk (09/16/2023)  Social Connections: Socially Integrated (09/16/2023)  Tobacco Use: Low Risk  (09/16/2023)   SDOH Interventions:     Readmission Risk Interventions    09/16/2023   10:04 PM 09/16/2023    9:30 PM  Readmission Risk Prevention Plan  Post Dischage Appt Complete Complete  Medication Screening Complete Complete  Transportation Screening Complete Complete

## 2023-09-28 ENCOUNTER — Telehealth: Payer: Self-pay | Admitting: *Deleted

## 2023-09-28 DIAGNOSIS — K5720 Diverticulitis of large intestine with perforation and abscess without bleeding: Secondary | ICD-10-CM | POA: Diagnosis not present

## 2023-09-28 LAB — CBC
HCT: 27.3 % — ABNORMAL LOW (ref 36.0–46.0)
Hemoglobin: 8.7 g/dL — ABNORMAL LOW (ref 12.0–15.0)
MCH: 31 pg (ref 26.0–34.0)
MCHC: 31.9 g/dL (ref 30.0–36.0)
MCV: 97.2 fL (ref 80.0–100.0)
Platelets: 327 10*3/uL (ref 150–400)
RBC: 2.81 MIL/uL — ABNORMAL LOW (ref 3.87–5.11)
RDW: 14.6 % (ref 11.5–15.5)
WBC: 8.3 10*3/uL (ref 4.0–10.5)
nRBC: 0 % (ref 0.0–0.2)

## 2023-09-28 LAB — BASIC METABOLIC PANEL WITH GFR
Anion gap: 6 (ref 5–15)
BUN: 12 mg/dL (ref 8–23)
CO2: 25 mmol/L (ref 22–32)
Calcium: 8.2 mg/dL — ABNORMAL LOW (ref 8.9–10.3)
Chloride: 100 mmol/L (ref 98–111)
Creatinine, Ser: 1.21 mg/dL — ABNORMAL HIGH (ref 0.44–1.00)
GFR, Estimated: 46 mL/min — ABNORMAL LOW (ref >60)
Glucose, Bld: 84 mg/dL (ref 70–99)
Potassium: 3.8 mmol/L (ref 3.5–5.1)
Sodium: 131 mmol/L — ABNORMAL LOW (ref 135–145)

## 2023-09-28 LAB — GLUCOSE, CAPILLARY
Glucose-Capillary: 76 mg/dL (ref 70–99)
Glucose-Capillary: 94 mg/dL (ref 70–99)

## 2023-09-28 MED ORDER — APIXABAN 5 MG PO TABS: 5.0000 mg | ORAL_TABLET | Freq: Two times a day (BID) | ORAL | 2 refills | Status: DC

## 2023-09-28 MED ORDER — ONDANSETRON HCL 4 MG PO TABS: 4.0000 mg | ORAL_TABLET | Freq: Every day | ORAL | 1 refills | Status: DC | PRN

## 2023-09-28 MED ORDER — TRAMADOL HCL 50 MG PO TABS: 50.0000 mg | ORAL_TABLET | Freq: Two times a day (BID) | ORAL | 0 refills | Status: DC | PRN

## 2023-09-28 MED ORDER — METOPROLOL TARTRATE 50 MG PO TABS: 50.0000 mg | ORAL_TABLET | Freq: Two times a day (BID) | ORAL | 1 refills | Status: DC

## 2023-09-28 MED ORDER — AMIODARONE HCL 200 MG PO TABS: 200.0000 mg | ORAL_TABLET | Freq: Every day | ORAL | 1 refills | Status: DC

## 2023-09-28 MED ORDER — SENNOSIDES-DOCUSATE SODIUM 8.6-50 MG PO TABS: 2.0000 | ORAL_TABLET | Freq: Two times a day (BID) | ORAL | 0 refills | Status: AC

## 2023-09-28 MED ORDER — CIPROFLOXACIN HCL 500 MG PO TABS: 500.0000 mg | ORAL_TABLET | Freq: Two times a day (BID) | ORAL | 0 refills | Status: AC

## 2023-09-28 MED ORDER — APIXABAN 5 MG PO TABS
5.0000 mg | ORAL_TABLET | Freq: Two times a day (BID) | ORAL | Status: DC
  Administered 2023-09-28: 5 mg via ORAL
  Filled 2023-09-28: qty 1

## 2023-09-28 MED ORDER — METRONIDAZOLE 500 MG PO TABS: 500.0000 mg | ORAL_TABLET | Freq: Three times a day (TID) | ORAL | 0 refills | Status: AC

## 2023-09-28 NOTE — Progress Notes (Signed)
 Nurse at bedside,patient alert,and oriented times four.Blood pressure 114/50,heart rate 71,Dr Mason Sole notified,holding the metoprolol  this am. No c/o pain or discomfort noted.Pla of care on going.Family at bedside.

## 2023-09-28 NOTE — Plan of Care (Signed)
  Problem: Pain Managment: Goal: General experience of comfort will improve and/or be controlled Outcome: Progressing   Problem: Safety: Goal: Ability to remain free from injury will improve Outcome: Progressing

## 2023-09-28 NOTE — Discharge Summary (Signed)
 Physician Discharge Summary  Felicia Frank WUJ:[id number] DOB: 19-Dec-1945 DOA: 09/16/2023  PCP: Allana Ishikawa, MD  Admit date: 09/16/2023  Discharge date: 09/28/2023  Admitted From:Home  Disposition:  Home  Recommendations for Outpatient Follow-up:  Follow up with PCP in 1-2 months Follow-up with Dr. Larrie Po as requested 5/13 Continue to remain on antibiotics for 7 more days as prescribed Started on Eliquis for anticoagulation Continue blood pressure medications with amiodarone /metoprolol  as well as others as noted below  Home Health: Yes with PT/OT  Equipment/Devices: Home hospital bed, 3 n 1  Discharge Condition:Stable  CODE STATUS: Full  Diet recommendation: Heart Healthy  Brief/Interim Summary: Mrs. 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    78 y.o. female with medical history significant of Hypothyroidism, HTN, CKD stage 3A, and GERD, who presented with decreased appetite and 20 pound weight loss over the last month prior to admission. Positive falls at home. Positive nausea and vomiting. Her family decided to bring her to the hospital. On her initial physical examination her blood pressure was 159/92, HR 70 and 02 saturation 91% Lungs with no wheezing or rales, heart with S1 and S2 present and regular with no gallops, abdomen with diffuse tenderness, and mild guarding, no rebound, positive lower extremity edema.    CT abdomen and pelvis with positive acute sigmoid diverticulitis with colo-enteric fistula. New superior endplate compression fracture of L1.    Patient was placed on IV fluids and IV antibiotics NG tube was placed for decompression.  Surgery was consulted.    4/22 repeat CT abdomen and pelvis with perforated sigmoid diverticulitis and organizing pelvic area fluid/gas collection measuring 6 x 4.4 CM--with coloenteric fistula and small bowel obstruction with  transition point within the distal small bowel adjacent to the inflamed sigmoid colon    04/23 IR consulted and drain placement without complications. 04/28 CT abdomen and pelvis right transgluteal percutaneous drain has evacuated the fluid component of the central pelvic diverticular abscess. Pocket of air anterior of the drainage catheter remains measuring roughly 3,4 cm in diameter. No new abscess.  Stable dilatated small bowel loops containing fluid measuring up to 3,5 cm. Findings consistent with persistent ileus/ partial small bowel obstruction,    04/29 patient placed on bowel regimen, and soft diet, continue with no bowel movement.   4/30 patient has had bowel movement and is tolerating diet and is stable for discharge from general surgery standpoint to remain on antibiotics for 1 more week.  Okay to start on Eliquis for anticoagulation given new diagnosis of atrial fibrillation.  No other acute events or concerns noted and she is now stable for discharge.  Discharge Diagnoses:  Principal Problem:   Diverticulitis of colon with perforation Active Problems:   Atrial fibrillation, new onset (HCC)   Essential hypertension   Stage 3a chronic kidney disease (HCC)   Hypothyroidism   Gastroesophageal reflux disease   Compression fracture of L1 lumbar vertebra (HCC)   Chronic anemia   Obesity, class 1  Principal discharge diagnosis: Diverticulitis of colon with perforation and formation of intra-abdominal abscess.  Atrial fibrillation, new onset.  Discharge Instructions  Discharge Instructions     Diet - low sodium heart healthy   Complete by: As directed    Increase activity slowly   Complete by: As directed    No wound care   Complete by: As directed       Allergies as of 09/28/2023  Reactions   Amlodipine Swelling   Clonidine Rash   Rash with patch only.  Okay to take pill        Medication List     STOP taking these medications    aspirin EC 81 MG tablet    carvedilol  25 MG tablet Commonly known as: COREG    cloNIDine 0.1 MG tablet Commonly known as: CATAPRES   hydrALAZINE 25 MG tablet Commonly known as: APRESOLINE       TAKE these medications    amiodarone  200 MG tablet Commonly known as: PACERONE  Take 1 tablet (200 mg total) by mouth daily. Start taking on: Sep 29, 2023   apixaban 5 MG Tabs tablet Commonly known as: ELIQUIS Take 1 tablet (5 mg total) by mouth 2 (two) times daily.   ciprofloxacin  500 MG tablet Commonly known as: CIPRO  Take 1 tablet (500 mg total) by mouth 2 (two) times daily for 7 days.   doxazosin  8 MG tablet Commonly known as: CARDURA  Take 8 mg by mouth at bedtime.   estradiol 0.1 MG/GM vaginal cream Commonly known as: ESTRACE Place 0.5 g vaginally as needed (vaginal dryness/irritation). Twice a week PRN   levothyroxine  50 MCG tablet Commonly known as: SYNTHROID  Take 50 mcg by mouth daily.   metoprolol  tartrate 50 MG tablet Commonly known as: LOPRESSOR  Take 1 tablet (50 mg total) by mouth 2 (two) times daily.   metroNIDAZOLE  500 MG tablet Commonly known as: FLAGYL  Take 1 tablet (500 mg total) by mouth every 8 (eight) hours for 7 days.   ondansetron  4 MG tablet Commonly known as: Zofran  Take 1 tablet (4 mg total) by mouth daily as needed for nausea or vomiting.   senna-docusate 8.6-50 MG tablet Commonly known as: Senokot-S Take 2 tablets by mouth 2 (two) times daily.   traMADol 50 MG tablet Commonly known as: Ultram Take 1 tablet (50 mg total) by mouth every 12 (twelve) hours as needed.   zolpidem  10 MG tablet Commonly known as: AMBIEN  Take 10 mg by mouth at bedtime as needed for sleep.               Durable Medical Equipment  (From admission, onward)           Start     Ordered   09/27/23 1426  For home use only DME 3 n 1  Once        09/27/23 1426   09/27/23 1426  For home use only DME Hospital bed  Once       Question Answer Comment  Length of Need 12 Months   The  above medical condition requires: Patient requires the ability to reposition frequently   Head must be elevated greater than: 45 degrees   Bed type Semi-electric      09/27/23 1426            Follow-up Information     [facility] Follow up.   Why: [facility] staff will call you to schedule in home RN, PT, OT visits Contact information: [address] [place] [address] [phone]         [name], MD. Schedule an appointment as soon as possible for a visit on 10/11/2023.   Specialty: General Surgery Contact information: [address] [place] [address] [phone]         [name], [name], MD. Schedule an appointment as soon as possible for a visit in 1 month(s).   Specialty: Family Medicine Contact information: [address] [place] [address] [phone]                Allergies  Allergen Reactions   Amlodipine Swelling   Clonidine Rash    Rash with patch only.  Okay to take pill    Consultations: General surgery   Procedures/Studies: CT ABDOMEN PELVIS W CONTRAST Result Date: 09/26/2023 CLINICAL DATA:  Sigmoid diverticulitis with abscess. Status post percutaneous catheter drainage of diverticular abscess on 09/21/2023. EXAM: CT ABDOMEN AND PELVIS WITH CONTRAST TECHNIQUE: Multidetector CT imaging of the abdomen and pelvis was performed using the standard protocol following bolus administration of intravenous contrast. RADIATION DOSE REDUCTION: This exam was performed according to the departmental dose-optimization program which includes automated exposure control, adjustment of the mA and/or kV according to patient size and/or use of iterative reconstruction technique. CONTRAST:  100mL OMNIPAQUE  IOHEXOL  300 MG/ML  SOLN COMPARISON:  Prior CT on 09/20/2023 and imaging at the time abscess drainage on 09/21/2023. FINDINGS: Lower chest: Trace bilateral pleural effusions. Hepatobiliary: No focal  liver abnormality is seen. Status post cholecystectomy. No biliary dilatation. Pancreas: Stable atrophic pancreas. No pancreatic ductal dilatation or surrounding inflammatory changes. Spleen: Normal in size without focal abnormality. Adrenals/Urinary Tract: Adrenal glands are unremarkable. Kidneys are normal, without renal calculi, focal lesion, or hydronephrosis. Bladder is unremarkable. Stomach/Bowel: Stable dilated small bowel loops containing fluid measuring up to 3.5 cm. Findings are consistent with persistent ileus/partial small bowel obstruction. No free air. Decrease in free fluid within the pelvis. Right transgluteal percutaneous drain has evacuated the fluid component the central pelvic diverticular abscess. Pocket of air anterior to the drainage catheter remains measuring roughly 3.4 cm in diameter. No new abscess identified. Vascular/Lymphatic: Stable aortic atherosclerosis without aneurysm. No enlarged abdominal or pelvic lymph nodes. Reproductive: Status post hysterectomy. No adnexal masses. Other: No hernias.  Stable mild edema/anasarca of the body wall. 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 IMPRESSION: 1. Right transgluteal percutaneous drain has evacuated the fluid component of the central pelvic diverticular abscess. Pocket of air anterior to the drainage catheter remains measuring roughly 3.4 cm in diameter. No new abscess identified. 2. Stable dilated small bowel loops containing fluid measuring up to 3.5 cm. Findings are consistent with persistent ileus/partial small bowel obstruction. 3. Stable mild edema/anasarca of the body wall. 4. Trace bilateral pleural effusions. 5. Stable aortic atherosclerosis. 6. 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 Electronically Signed   By: Erica Hau M.D.   On: 09/26/2023 13:15   CT GUIDED  PERITONEAL/RETROPERITONEAL FLUID DRAIN BY PERC CATH Result Date: 09/21/2023 INDICATION: 78 year old female with history of diverticular abscess. EXAM: CT PERC DRAIN PERITONEAL ABCESS COMPARISON:  09/20/2023, 09/16/2023 MEDICATIONS: The patient is currently admitted to the hospital and receiving intravenous antibiotics. The antibiotics were administered within an appropriate time frame prior to the initiation of the procedure. ANESTHESIA/SEDATION: Moderate (conscious) sedation was employed during this procedure. A total of Versed  1 mg and Fentanyl  50 mcg was administered intravenously. Moderate Sedation Time: 16 minutes. The patient's level of consciousness and vital signs were monitored continuously by radiology nursing throughout the procedure under my direct supervision. CONTRAST:  None COMPLICATIONS: None immediate. PROCEDURE: RADIATION DOSE REDUCTION: This exam was performed according to the departmental dose-optimization program which includes automated exposure control, adjustment of the mA and/or kV according to patient size and/or use of iterative reconstruction technique. Informed written  consent was obtained from the patient after a discussion of the risks, benefits and alternatives to treatment. The patient was placed prone on the CT gantry and a pre procedural CT was performed re-demonstrating the known abscess/fluid collection within the mid pelvis. The procedure was planned. A timeout was performed prior to the initiation of the procedure. The right gluteal region was prepped and draped in the usual sterile fashion. The overlying soft tissues were anesthetized with 1% lidocaine  with epinephrine . Appropriate trajectory was planned with the use of a 22 gauge spinal needle. An 18 gauge trocar needle was advanced into the abscess/fluid collection and a short Amplatz super stiff wire was coiled within the collection. Appropriate positioning was confirmed with a limited CT scan. The tract was serially  dilated allowing placement of a 10 [place] all-purpose drainage catheter. Appropriate positioning was confirmed with a limited postprocedural CT scan. Approximately 30 ml of purulent fluid was aspirated. The tube was connected to a bulb suction and sutured in place. A dressing was placed. The patient tolerated the procedure well without immediate post procedural complication. IMPRESSION: Successful CT guided placement of a transgluteal, 10 French all purpose drain catheter into the pelvic fluid collection with aspiration of 30 mL of purulent fluid. Samples were sent for culture. Creasie Doctor, MD [facility] Electronically Signed   By: Creasie Doctor M.D.   On: 09/21/2023 16:07   ECHOCARDIOGRAM COMPLETE Result Date: 09/21/2023    ECHOCARDIOGRAM REPORT   Patient Name:   LORILYNN CATA Date of Exam: 09/21/2023 Medical Rec #:  [id number]       Height:       64.0 in Accession #:    [id number]      Weight:       188.1 lb Date of Birth:  Mar 22, 1946       BSA:          1.906 m Patient Age:    78 years        BP:           146/53 mmHg Patient Gender: F               HR:           66 bpm. Exam Location:  [facility] Procedure: 2D Echo, Cardiac Doppler and Color Doppler (Both Spectral and Color            Flow Doppler were utilized during procedure). Indications:    I48.91* Unspeicified atrial fibrillation  History:        Patient has no prior history of Echocardiogram examinations.                 Arrythmias:Atrial Fibrillation; Risk Factors:Hypertension.  Sonographer:    Andrena Bang Referring Phys: [id number] COURAGE EMOKPAE IMPRESSIONS  1. Left ventricular ejection fraction, by estimation, is 65 to 70%. The left ventricle has normal function. The left ventricle has no regional wall motion abnormalities. Left ventricular diastolic parameters are indeterminate.  2. Right ventricular systolic function is normal. The right ventricular size is normal. Tricuspid regurgitation  signal is inadequate for assessing PA pressure.  3. The mitral valve is normal in structure. No evidence of mitral valve regurgitation. No evidence of mitral stenosis.  4. The aortic valve is tricuspid. Aortic valve regurgitation is not visualized. No aortic stenosis is present. FINDINGS  Left Ventricle: Left ventricular ejection fraction, by estimation, is 65 to 70%. The left ventricle has normal function. The left ventricle has no regional wall  motion abnormalities. Definity  contrast agent was given IV to delineate the left ventricular  endocardial borders. The left ventricular internal cavity size was normal in size. There is no left ventricular hypertrophy. Left ventricular diastolic parameters are indeterminate. Right Ventricle: The right ventricular size is normal. Right vetricular wall thickness was not well visualized. Right ventricular systolic function is normal. Tricuspid regurgitation signal is inadequate for assessing PA pressure. Left Atrium: Left atrial size was normal in size. Right Atrium: Right atrial size was normal in size. Pericardium: There is no evidence of pericardial effusion. Mitral Valve: The mitral valve is normal in structure. No evidence of mitral valve regurgitation. No evidence of mitral valve stenosis. Tricuspid Valve: The tricuspid valve is normal in structure. Tricuspid valve regurgitation is trivial. No evidence of tricuspid stenosis. Aortic Valve: The aortic valve is tricuspid. Aortic valve regurgitation is not visualized. No aortic stenosis is present. Aortic valve mean gradient measures 4.0 mmHg. Aortic valve peak gradient measures 10.9 mmHg. Aortic valve area, by VTI measures 1.88  cm. Pulmonic Valve: The pulmonic valve was not well visualized. Pulmonic valve regurgitation is not visualized. No evidence of pulmonic stenosis. Aorta: The aortic root and ascending aorta are structurally normal, with no evidence of dilitation. Venous: The inferior vena cava was not well  visualized. IAS/Shunts: The interatrial septum was not well visualized.  LEFT VENTRICLE PLAX 2D LVIDd:         4.10 cm     Diastology LVIDs:         2.30 cm     LV e' medial:    6.84 cm/s LV PW:         0.80 cm     LV E/e' medial:  7.8 LV IVS:        0.90 cm     LV e' lateral:   9.14 cm/s LVOT diam:     1.70 cm     LV E/e' lateral: 5.9 LV SV:         55 LV SV Index:   29 LVOT Area:     2.27 cm  LV Volumes (MOD) LV vol d, MOD A2C: 72.6 ml LV vol d, MOD A4C: 81.7 ml LV vol s, MOD A2C: 21.2 ml LV vol s, MOD A4C: 28.1 ml LV SV MOD A2C:     51.4 ml LV SV MOD A4C:     81.7 ml LV SV MOD BP:      53.2 ml RIGHT VENTRICLE RV S prime:     19.10 cm/s TAPSE (M-mode): 1.5 cm LEFT ATRIUM             Index LA diam:        3.00 cm 1.57 cm/m LA Vol (A2C):   48.2 ml 25.29 ml/m LA Vol (A4C):   35.8 ml 18.78 ml/m LA Biplane Vol: 41.6 ml 21.83 ml/m  AORTIC VALVE AV Area (Vmax):    1.93 cm AV Area (Vmean):   1.72 cm AV Area (VTI):     1.88 cm AV Vmax:           165.00 cm/s AV Vmean:          88.200 cm/s AV VTI:            0.293 m AV Peak Grad:      10.9 mmHg AV Mean Grad:      4.0 mmHg LVOT Vmax:         140.00 cm/s LVOT Vmean:        66.800 cm/s  LVOT VTI:          0.243 m LVOT/AV VTI ratio: 0.83  AORTA Ao Root diam: 3.20 cm Ao Asc diam:  2.80 cm MITRAL VALVE MV Area (PHT): 2.65 cm    SHUNTS MV Decel Time: 286 msec    Systemic VTI:  0.24 m MV E velocity: 53.60 cm/s  Systemic Diam: 1.70 cm MV A velocity: 81.50 cm/s MV E/A ratio:  0.66 Armida Lander MD Electronically signed by Armida Lander MD Signature Date/Time: 09/21/2023/11:47:20 AM    Final    DG CHEST PORT 1 VIEW Result Date: 09/20/2023 CLINICAL DATA:  Nasogastric tube placement. EXAM: PORTABLE CHEST 1 VIEW COMPARISON:  None Available. FINDINGS: Mild cardiomegaly. Nasogastric tube tip is seen in expected position of stomach. Both lungs are clear. The visualized skeletal structures are unremarkable. IMPRESSION: Nasogastric tube tip seen in expected position of stomach.  Electronically Signed   By: Rosalene Colon M.D.   On: 09/20/2023 17:47   CT ABDOMEN PELVIS WO CONTRAST Result Date: 09/20/2023 CLINICAL DATA:  Left lower quadrant abdominal pain, diverticulitis with perforation EXAM: CT ABDOMEN AND PELVIS WITHOUT CONTRAST TECHNIQUE: Multidetector CT imaging of the abdomen and pelvis was performed following the standard protocol without IV contrast. Unenhanced CT was performed per clinician order. Lack of IV contrast limits sensitivity and specificity, especially for evaluation of abdominal/pelvic solid viscera. RADIATION DOSE REDUCTION: This exam was performed according to the departmental dose-optimization program which includes automated exposure control, adjustment of the mA and/or kV according to patient size and/or use of iterative reconstruction technique. COMPARISON:  09/16/2023 FINDINGS: Lower chest: Trace bilateral pleural effusions. No acute airspace disease. Hepatobiliary: Unremarkable unenhanced appearance of the liver. Prior cholecystectomy. Pancreas: Unremarkable unenhanced appearance. Spleen: Unremarkable unenhanced appearance. Adrenals/Urinary Tract: No urinary tract calculi or obstructive uropathy within either kidney. The adrenals and bladder are grossly unremarkable. Stomach/Bowel: The acute sigmoid diverticulitis seen previously is again identified, with enlarging gas and fluid collection adjacent to the inflamed sigmoid colon reference image 68/5, measuring 5.8 x 4.4 cm. The suspected colo enteric fistula previously identified is again noted, though slightly less pronounced on this exam, reference images 72-75 of series 5. Since the previous exam, free intraperitoneal gas is seen within the upper abdomen adjacent to the ventral margin of the liver. There is new distension of the small bowel, measuring up to 3.5 cm in diameter with multiple gas fluid levels. Transition point is seen within the pelvis adjacent to the area of inflamed sigmoid colon, consistent  with small-bowel obstruction likely secondary to adhesions. Vascular/Lymphatic: Stable atherosclerosis of the aorta. No pathologic adenopathy. Reproductive: Prior hysterectomy.  No adnexal masses. Other: Small amount of free fluid within the dependent lower pelvis, increased since prior study. As discussed above, organizing gas/fluid collection adjacent to the inflamed sigmoid colon has increased in size since prior study, now measuring 5.8 x 4.4 cm reference image 60/5. New punctate foci of pneumoperitoneum are seen within the right upper quadrant of the abdomen consistent with perforated sigmoid diverticulitis noted previously. No abdominal wall hernia. Musculoskeletal: Stable acute to subacute compression deformity superior endplate L1 vertebral body. No new acute or destructive bony abnormalities. Reconstructed images demonstrate no additional findings. IMPRESSION: 1. Perforated sigmoid diverticulitis, with enlarged organizing gas/fluid collection in the lower pelvis now measuring up to 5.8 x 4.4 cm. New punctate foci of free intraperitoneal gas in the right upper quadrant, as well as increased free fluid within the lower pelvis. The coloenteric fistula noted previously is again identified, though less  prominent on this exam. 2. Interval development of small bowel obstruction, with transition point within the distal small bowel adjacent to the inflamed sigmoid colon and organizing pelvic fluid collection described above. 3. Trace bilateral pleural effusions. 4.  Aortic Atherosclerosis (ICD10-I70.0). These results will be called to the ordering clinician or representative by the Radiologist Assistant, and communication documented in the PACS or [employer]. Electronically Signed   By: Bobbye Burrow M.D.   On: 09/20/2023 16:16   CT ABDOMEN PELVIS W CONTRAST Result Date: 09/16/2023 CLINICAL DATA:  Abdominal and back pain after fall on Wednesday. Unable to eat for 2 weeks. Emesis after eating. Recent weight  loss of 20 pounds. Back surgery in November of 2024 EXAM: CT ABDOMEN AND PELVIS WITH CONTRAST CT Lumbar Spine with contrast TECHNIQUE: Technique: Multiplanar CT images of the lumbar spine were reconstructed from contemporary CT of the Abdomen and Pelvis. Multidetector CT imaging of the abdomen and pelvis was performed using the standard protocol following bolus administration of intravenous contrast. RADIATION DOSE REDUCTION: This exam was performed according to the departmental dose-optimization program which includes automated exposure control, adjustment of the mA and/or kV according to patient size and/or use of iterative reconstruction technique. CONTRAST:  80mL OMNIPAQUE  IOHEXOL  300 MG/ML  SOLN COMPARISON:  CT abdomen pelvis 10/05/2021 and MRI lumbar spine 06/22/2023 FINDINGS: ABDOMEN/PELVIS: Lower chest: No acute abnormality. Hepatobiliary: Unremarkable liver. Cholecystectomy. No biliary dilation. Pancreas: Fatty atrophy.  No acute abnormality. Spleen: Unremarkable. Adrenals/Urinary Tract: Stable adrenal glands and kidneys. No urinary calculi or hydronephrosis. Bladder is unremarkable. Stomach/Bowel: Sigmoid diverticulosis. Wall thickening and adjacent stranding about the sigmoid colon there is a tract of gas and fluid extending from the inflamed sigmoid colon to an adjacent loop of small bowel compatible with coloenteric fistula (series 3/image 50 9-66). Normal caliber large and small bowel. Moderate colonic stool burden. The appendix is not visualized.Stomach is within normal limits. Vascular/Lymphatic: Aortic atherosclerosis. No enlarged abdominal or pelvic lymph nodes. Reproductive: Status post hysterectomy. No adnexal masses. Other: Free fluid in the pelvis along the inflamed sigmoid colon and the colo enteric fistula. Musculoskeletal: No acute fracture in the pelvis. LUMBAR SPINE: Segmentation: 5 lumbar type vertebrae. Alignment: No evidence of traumatic listhesis. Chronic retrolisthesis of L3 and L5.  Vertebrae: Superior endplate compression fracture of L1 is new since MRI 06/22/2023. There is 15 percent vertebral body height loss centrally. 2 mm of retropulsion of the superior endplate. Paraspinal and other soft tissues: See above. Disc levels: Multilevel spondylosis, disc space height loss, degenerative endplate changes greatest at L3-L4. Spinal canal narrowing is greatest at L4-L5 where it is moderate. IMPRESSION: 1. Acute sigmoid diverticulitis with coloenteric fistula. 2. Superior endplate compression fracture of L1 is new since MRI 06/22/2023. There is 15 percent vertebral body height loss centrally. 2 mm of retropulsion of the superior endplate. Electronically Signed   By: Rozell Cornet M.D.   On: 09/16/2023 17:59   CT L-SPINE NO CHARGE Result Date: 09/16/2023 CLINICAL DATA:  Abdominal and back pain after fall on Wednesday. Unable to eat for 2 weeks. Emesis after eating. Recent weight loss of 20 pounds. Back surgery in November of 2024 EXAM: CT ABDOMEN AND PELVIS WITH CONTRAST CT Lumbar Spine with contrast TECHNIQUE: Technique: Multiplanar CT images of the lumbar spine were reconstructed from contemporary CT of the Abdomen and Pelvis. Multidetector CT imaging of the abdomen and pelvis was performed using the standard protocol following bolus administration of intravenous contrast. RADIATION DOSE REDUCTION: This exam was performed according to the  departmental dose-optimization program which includes automated exposure control, adjustment of the mA and/or kV according to patient size and/or use of iterative reconstruction technique. CONTRAST:  80mL OMNIPAQUE  IOHEXOL  300 MG/ML  SOLN COMPARISON:  CT abdomen pelvis 10/05/2021 and MRI lumbar spine 06/22/2023 FINDINGS: ABDOMEN/PELVIS: Lower chest: No acute abnormality. Hepatobiliary: Unremarkable liver. Cholecystectomy. No biliary dilation. Pancreas: Fatty atrophy.  No acute abnormality. Spleen: Unremarkable. Adrenals/Urinary Tract: Stable adrenal glands  and kidneys. No urinary calculi or hydronephrosis. Bladder is unremarkable. Stomach/Bowel: Sigmoid diverticulosis. Wall thickening and adjacent stranding about the sigmoid colon there is a tract of gas and fluid extending from the inflamed sigmoid colon to an adjacent loop of small bowel compatible with coloenteric fistula (series 3/image [id number]). Normal caliber large and small bowel. Moderate colonic stool burden. The appendix is not visualized.Stomach is within normal limits. Vascular/Lymphatic: Aortic atherosclerosis. No enlarged abdominal or pelvic lymph nodes. Reproductive: Status post hysterectomy. No adnexal masses. Other: Free fluid in the pelvis along the inflamed sigmoid colon and the colo enteric fistula. Musculoskeletal: No acute fracture in the pelvis. LUMBAR SPINE: Segmentation: 5 lumbar type vertebrae. Alignment: No evidence of traumatic listhesis. Chronic retrolisthesis of L3 and L5. Vertebrae: Superior endplate compression fracture of L1 is new since MRI 06/22/2023. There is 15 percent vertebral body height loss centrally. 2 mm of retropulsion of the superior endplate. Paraspinal and other soft tissues: See above. Disc levels: Multilevel spondylosis, disc space height loss, degenerative endplate changes greatest at L3-L4. Spinal canal narrowing is greatest at L4-L5 where it is moderate. IMPRESSION: 1. Acute sigmoid diverticulitis with coloenteric fistula. 2. Superior endplate compression fracture of L1 is new since MRI 06/22/2023. There is 15 percent vertebral body height loss centrally. 2 mm of retropulsion of the superior endplate. Electronically Signed   By: Rozell Cornet M.D.   On: 09/16/2023 17:59     Discharge Exam: Vitals:   09/28/23 0622 09/28/23 0916  BP: (!) 143/65 (!) 114/50  Pulse: 69 66  Resp: 17   Temp: 98.4 F (36.9 C) 98.5 F (36.9 C)  SpO2: 97% 96%   Vitals:   09/27/23 1424 09/27/23 1943 09/28/23 0622 09/28/23 0916  BP: 109/78 (!) 149/67 (!) 143/65 (!) 114/50   Pulse: 68 73 69 66  Resp:  17 17   Temp: 98.2 F (36.8 C) 98 F (36.7 C) 98.4 F (36.9 C) 98.5 F (36.9 C)  TempSrc: Oral Oral Oral Oral  SpO2: 98% 98% 97% 96%  Weight:      Height:        General: Pt is alert, awake, not in acute distress Cardiovascular: RRR, S1/S2 +, no rubs, no gallops Respiratory: CTA bilaterally, no wheezing, no rhonchi Abdominal: Soft, NT, ND, bowel sounds + Extremities: no edema, no cyanosis    The results of significant diagnostics from this hospitalization (including imaging, microbiology, ancillary and laboratory) are listed below for reference.     Microbiology: Recent Results (from the past 240 hours)  MRSA Next Gen by PCR, Nasal     Status: None   Collection Time: 09/18/23 11:06 AM   Specimen: Nasal Mucosa; Nasal Swab  Result Value Ref Range Status   MRSA by PCR Next Gen NOT DETECTED NOT DETECTED Final    Comment: (NOTE) The GeneXpert MRSA Assay (FDA approved for NASAL specimens only), is one component of a comprehensive MRSA colonization surveillance program. It is not intended to diagnose MRSA infection nor to guide or monitor treatment for MRSA infections. Test performance is not FDA approved in patients  less than 62 years old. Performed at [facility], [address], [place], [place] [address]   Aerobic/Anaerobic Culture w Gram Stain (surgical/deep wound)     Status: None   Collection Time: 09/21/23  2:33 PM   Specimen: Abscess  Result Value Ref Range Status   Specimen Description ABSCESS  Final   Special Requests DIVERTICULUM  Final   Gram Stain   Final    MODERATE WBC PRESENT,BOTH PMN AND MONONUCLEAR MODERATE GRAM POSITIVE COCCI IN PAIRS AND CHAINS    Culture   Final    FEW PSEUDOMONAS AERUGINOSA RARE ESCHERICHIA COLI MODERATE STREPTOCOCCUS CONSTELLATUS FEW BACTEROIDES OVATUS BETA LACTAMASE POSITIVE Performed at [facility], [address], [place], [place] [address]    Report Status 09/26/2023 FINAL  Final    Organism ID, Bacteria PSEUDOMONAS AERUGINOSA  Final   Organism ID, Bacteria ESCHERICHIA COLI  Final   Organism ID, Bacteria STREPTOCOCCUS CONSTELLATUS  Final      Susceptibility   Streptococcus constellatus - MIC*    PENICILLIN <=0.06 SENSITIVE Sensitive     CEFTRIAXONE <=0.12 SENSITIVE Sensitive     ERYTHROMYCIN >=8 RESISTANT Resistant     LEVOFLOXACIN 0.5 SENSITIVE Sensitive     VANCOMYCIN 0.5 SENSITIVE Sensitive     * MODERATE STREPTOCOCCUS CONSTELLATUS   Escherichia coli - MIC*    AMPICILLIN >=32 RESISTANT Resistant     CEFEPIME <=0.12 SENSITIVE Sensitive     CEFTAZIDIME <=1 SENSITIVE Sensitive     CEFTRIAXONE <=0.25 SENSITIVE Sensitive     CIPROFLOXACIN  <=0.25 SENSITIVE Sensitive     GENTAMICIN >=16 RESISTANT Resistant     IMIPENEM <=0.25 SENSITIVE Sensitive     TRIMETH/SULFA >=320 RESISTANT Resistant     AMPICILLIN/SULBACTAM >=32 RESISTANT Resistant     PIP/TAZO 64 INTERMEDIATE Intermediate ug/mL    * RARE ESCHERICHIA COLI   Pseudomonas aeruginosa - MIC*    CEFTAZIDIME 2 SENSITIVE Sensitive     CIPROFLOXACIN  <=0.25 SENSITIVE Sensitive     GENTAMICIN <=1 SENSITIVE Sensitive     IMIPENEM <=0.25 SENSITIVE Sensitive     PIP/TAZO 8 SENSITIVE Sensitive ug/mL    CEFEPIME 2 SENSITIVE Sensitive     * FEW PSEUDOMONAS AERUGINOSA     Labs: BNP (last 3 results) No results for input(s): "BNP" in the last 8760 hours. Basic Metabolic Panel: Recent Labs  Lab 09/22/23 0646 09/23/23 0441 09/25/23 0428 09/26/23 0410 09/27/23 0354 09/28/23 0339  NA 131* 132* 132* 133* 131* 131*  K 3.8 3.9 4.1 4.0 4.0 3.8  CL 104 106 102 101 100 100  CO2 20* 18* 22 24 23 25   GLUCOSE 137* 122* 92 85 79 84  BUN 23 18 12 13 13 12   CREATININE 1.11* 1.05* 1.03* 1.16* 1.19* 1.21*  CALCIUM 7.8* 7.8* 8.0* 8.3* 8.3* 8.2*  MG 1.4* 1.5* 1.5* 1.7 1.9  --   PHOS  --  2.1* 2.1*  --   --   --    Liver Function Tests: No results for input(s): "AST", "ALT", "ALKPHOS", "BILITOT", "PROT", "ALBUMIN" in the last  168 hours. No results for input(s): "LIPASE", "AMYLASE" in the last 168 hours. No results for input(s): "AMMONIA" in the last 168 hours. CBC: Recent Labs  Lab 09/23/23 0441 09/25/23 0428 09/26/23 0410 09/27/23 0354 09/28/23 0339  WBC 12.1* 9.4 8.5 7.6 8.3  NEUTROABS  --   --   --  5.9  --   HGB 9.6* 8.8* 8.5* 8.5* 8.7*  HCT 30.5* 28.0* 27.0* 26.4* 27.3*  MCV 98.4 97.6  96.4 96.7 97.2  PLT 200 250 277 320 327   Cardiac Enzymes: No results for input(s): "CKTOTAL", "CKMB", "CKMBINDEX", "TROPONINI" in the last 168 hours. BNP: Invalid input(s): "POCBNP" CBG: Recent Labs  Lab 09/27/23 0552 09/27/23 1215 09/27/23 1813 09/28/23 0002 09/28/23 0618  GLUCAP 83 92 135* 94 76   D-Dimer No results for input(s): "DDIMER" in the last 72 hours. Hgb A1c No results for input(s): "HGBA1C" in the last 72 hours. Lipid Profile No results for input(s): "CHOL", "HDL", "LDLCALC", "TRIG", "CHOLHDL", "LDLDIRECT" in the last 72 hours. Thyroid function studies No results for input(s): "TSH", "T4TOTAL", "T3FREE", "THYROIDAB" in the last 72 hours.  Invalid input(s): "FREET3" Anemia work up No results for input(s): "VITAMINB12", "FOLATE", "FERRITIN", "TIBC", "IRON", "RETICCTPCT" in the last 72 hours. Urinalysis    Component Value Date/Time   COLORURINE YELLOW 09/16/2023 1508   APPEARANCEUR HAZY (A) 09/16/2023 1508   LABSPEC 1.015 09/16/2023 1508   PHURINE 5.0 09/16/2023 1508   GLUCOSEU NEGATIVE 09/16/2023 1508   HGBUR NEGATIVE 09/16/2023 1508   BILIRUBINUR NEGATIVE 09/16/2023 1508   KETONESUR NEGATIVE 09/16/2023 1508   PROTEINUR NEGATIVE 09/16/2023 1508   NITRITE NEGATIVE 09/16/2023 1508   LEUKOCYTESUR TRACE (A) 09/16/2023 1508   Sepsis Labs Recent Labs  Lab 09/25/23 0428 09/26/23 0410 09/27/23 0354 09/28/23 0339  WBC 9.4 8.5 7.6 8.3   Microbiology Recent Results (from the past 240 hours)  MRSA Next Gen by PCR, Nasal     Status: None   Collection Time: 09/18/23 11:06 AM    Specimen: Nasal Mucosa; Nasal Swab  Result Value Ref Range Status   MRSA by PCR Next Gen NOT DETECTED NOT DETECTED Final    Comment: (NOTE) The GeneXpert MRSA Assay (FDA approved for NASAL specimens only), is one component of a comprehensive MRSA colonization surveillance program. It is not intended to diagnose MRSA infection nor to guide or monitor treatment for MRSA infections. Test performance is not FDA approved in patients less than 18 years old. Performed at [facility], [address] [place], [place] [address]   Aerobic/Anaerobic Culture w Gram Stain (surgical/deep wound)     Status: None   Collection Time: 09/21/23  2:33 PM   Specimen: Abscess  Result Value Ref Range Status   Specimen Description ABSCESS  Final   Special Requests DIVERTICULUM  Final   Gram Stain   Final    MODERATE WBC PRESENT,BOTH PMN AND MONONUCLEAR MODERATE GRAM POSITIVE COCCI IN PAIRS AND CHAINS    Culture   Final    FEW PSEUDOMONAS AERUGINOSA RARE ESCHERICHIA COLI MODERATE STREPTOCOCCUS CONSTELLATUS FEW BACTEROIDES OVATUS BETA LACTAMASE POSITIVE Performed at [facility], [address] [place], [place] [address]    Report Status 09/26/2023 FINAL  Final   Organism ID, Bacteria PSEUDOMONAS AERUGINOSA  Final   Organism ID, Bacteria ESCHERICHIA COLI  Final   Organism ID, Bacteria STREPTOCOCCUS CONSTELLATUS  Final      Susceptibility   Streptococcus constellatus - MIC*    PENICILLIN <=0.06 SENSITIVE Sensitive     CEFTRIAXONE <=0.12 SENSITIVE Sensitive     ERYTHROMYCIN >=8 RESISTANT Resistant     LEVOFLOXACIN 0.5 SENSITIVE Sensitive     VANCOMYCIN 0.5 SENSITIVE Sensitive     * MODERATE STREPTOCOCCUS CONSTELLATUS   Escherichia coli - MIC*    AMPICILLIN >=32 RESISTANT Resistant     CEFEPIME <=0.12 SENSITIVE Sensitive     CEFTAZIDIME <=1 SENSITIVE Sensitive     CEFTRIAXONE <=0.25 SENSITIVE Sensitive     CIPROFLOXACIN  <=0.25 SENSITIVE  Sensitive     GENTAMICIN >=16 RESISTANT Resistant      IMIPENEM <=0.25 SENSITIVE Sensitive     TRIMETH/SULFA >=320 RESISTANT Resistant     AMPICILLIN/SULBACTAM >=32 RESISTANT Resistant     PIP/TAZO 64 INTERMEDIATE Intermediate ug/mL    * RARE ESCHERICHIA COLI   Pseudomonas aeruginosa - MIC*    CEFTAZIDIME 2 SENSITIVE Sensitive     CIPROFLOXACIN  <=0.25 SENSITIVE Sensitive     GENTAMICIN <=1 SENSITIVE Sensitive     IMIPENEM <=0.25 SENSITIVE Sensitive     PIP/TAZO 8 SENSITIVE Sensitive ug/mL    CEFEPIME 2 SENSITIVE Sensitive     * FEW PSEUDOMONAS AERUGINOSA     Time coordinating discharge: 35 minutes  SIGNED:   Cornelius Dill, DO [facility] 09/28/2023, 9:50 AM  If 7PM-7AM, please contact night-coverage [email or web address]

## 2023-09-28 NOTE — TOC Transition Note (Signed)
 Transition of Care [facility]) - Discharge Note   Patient Details  Name: Nadezhda Shiflet MRN: [id number] Date of Birth: 19-Jun-1945  [facility]) CM/SW Contact:  Orelia Binet, RN Phone Number: 09/28/2023, 11:22 AM   Clinical Narrative:   Patient discharging home. TOC faxed DC summary to [facility], DME will be delivered and to [facility] for home health services.     Final next level of care: Home w Home Health Services Barriers to Discharge: Barriers Resolved   Patient Goals and CMS Choice Patient states their goals for this hospitalization and ongoing recovery are:: return home CMS Medicare.gov Compare Post Acute Care list provided to:: Patient Choice offered to / list presented to : Patient      Discharge Placement                    Patient and family notified of of transfer: 09/28/23  Discharge Plan and Services Additional resources added to the After Visit Summary for   In-house Referral: Clinical Social Work   Post Acute Care Choice: Durable Medical Equipment, Home Health          DME Arranged: 3-N-1, Hospital bed DME Agency: Other - Comment Advertising account executive) Date DME Agency Contacted: 09/27/23     HH Arranged: RN, PT, OT HH Agency: [facility] Date HH Agency Contacted: 09/27/23   Representative spoke with at [facility] [place] Agency: Tammy  Social Drivers of Health (SDOH) Interventions SDOH Screenings   Food Insecurity: No Food Insecurity (09/16/2023)  Housing: Low Risk  (09/16/2023)  Transportation Needs: No Transportation Needs (09/16/2023)  Utilities: Not At Risk (09/16/2023)  Social Connections: Socially Integrated (09/16/2023)  Tobacco Use: Low Risk  (09/16/2023)     Readmission Risk Interventions    09/16/2023   10:04 PM 09/16/2023    9:30 PM  Readmission Risk Prevention Plan  Post Dischage Appt Complete Complete  Medication Screening Complete Complete  Transportation Screening Complete Complete

## 2023-09-28 NOTE — Telephone Encounter (Signed)
 Received call from patient daughter Idamae Maize [phone]~ telephone.   Reports that patient was discharged from [facility] today after treatment for sigmoid diverticulitis with a coloenteric fistula to the surrounding small intestine. Reports that drain was removed from pelvic abscess that has resolved.   Patient reports that she has stool coming from the drain access site. Advised that she may be having some purulent drainage from site as it was just removed today. Advised to keep area as clean and dry as she can. Advised to continue ABTx.   Hospital follow up appointment scheduled.

## 2023-09-28 NOTE — Progress Notes (Signed)
 Patient discharged home with instructions given on medications and follow up visits,patient and family verbalized understanding.IV discontinued,catheter intact. Prescriptions sent to Pharmacy of choice documented on AVS. Accompanied by staff to an awaiting vehicle.

## 2023-09-28 NOTE — Progress Notes (Signed)
 Subjective: Patient had a bowel movement yesterday evening.  She denies any abdominal pain.  Objective: Vital signs in last 24 hours: Temp:  [98 F (36.7 C)-98.4 F (36.9 C)] 98.4 F (36.9 C) (04/30 0622) Pulse Rate:  [67-73] 69 (04/30 0622) Resp:  [17] 17 (04/30 0622) BP: (109-149)/(63-78) 143/65 (04/30 0622) SpO2:  [97 %-98 %] 97 % (04/30 0622) Last BM Date : 09/28/23  Intake/Output from previous day: 04/29 0701 - 04/30 0700 In: 290 [P.O.:240; IV Piggyback:50] Out: 8 [Drains:8] Intake/Output this shift: No intake/output data recorded.  General appearance: alert, cooperative, and no distress GI: soft, non-tender; bowel sounds normal; no masses,  no organomegaly and pigtail catheter with minimal drainage.  This was removed.  Lab Results:  Recent Labs    09/27/23 0354 09/28/23 0339  WBC 7.6 8.3  HGB 8.5* 8.7*  HCT 26.4* 27.3*  PLT 320 327   BMET Recent Labs    09/27/23 0354 09/28/23 0339  NA 131* 131*  K 4.0 3.8  CL 100 100  CO2 23 25  GLUCOSE 79 84  BUN 13 12  CREATININE 1.19* 1.21*  CALCIUM 8.3* 8.2*   PT/INR No results for input(s): "LABPROT", "INR" in the last 72 hours.  Studies/Results: CT ABDOMEN PELVIS W CONTRAST Result Date: 09/26/2023 CLINICAL DATA:  Sigmoid diverticulitis with abscess. Status post percutaneous catheter drainage of diverticular abscess on 09/21/2023. EXAM: CT ABDOMEN AND PELVIS WITH CONTRAST TECHNIQUE: Multidetector CT imaging of the abdomen and pelvis was performed using the standard protocol following bolus administration of intravenous contrast. RADIATION DOSE REDUCTION: This exam was performed according to the departmental dose-optimization program which includes automated exposure control, adjustment of the mA and/or kV according to patient size and/or use of iterative reconstruction technique. CONTRAST:  OMNIPAQUE  IOHEXOL  300 MG/ML  SOLN COMPARISON:  Prior CT on 09/20/2023 and imaging at the time abscess drainage on  09/21/2023. FINDINGS: Lower chest: Trace bilateral pleural effusions. Hepatobiliary: No focal liver abnormality is seen. Status post cholecystectomy. No biliary dilatation. Pancreas: Stable atrophic pancreas. No pancreatic ductal dilatation or surrounding inflammatory changes. Spleen: Normal in size without focal abnormality. Adrenals/Urinary Tract: Adrenal glands are unremarkable. Kidneys are normal, without renal calculi, focal lesion, or hydronephrosis. Bladder is unremarkable. Stomach/Bowel: Stable dilated small bowel loops containing fluid measuring up to 3.5 cm. Findings are consistent with persistent ileus/partial small bowel obstruction. No free air. Decrease in free fluid within the pelvis. Right transgluteal percutaneous drain has evacuated the fluid component the central pelvic diverticular abscess. Pocket of air anterior to the drainage catheter remains measuring roughly 3.4 cm in diameter. No new abscess identified. Vascular/Lymphatic: Stable aortic atherosclerosis without aneurysm. No enlarged abdominal or pelvic lymph nodes. Reproductive: Status post hysterectomy. No adnexal masses. Other: No hernias.  Stable mild edema/anasarca of the body wall. 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 IMPRESSION: 1. Right transgluteal percutaneous drain has evacuated the fluid component of the central pelvic diverticular abscess. Pocket of air anterior to the drainage catheter remains measuring roughly 3.4 cm in diameter. No new abscess identified. 2. Stable dilated small bowel loops containing fluid measuring up to 3.5 cm. Findings are consistent with persistent ileus/partial small bowel obstruction. 3. Stable mild edema/anasarca of the body wall. 4. Trace bilateral pleural effusions. 5. Stable aortic atherosclerosis. 6. 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 Electronically  Signed   By: Erica Hau  M.D.   On: 09/26/2023 13:15    Anti-infectives: Anti-infectives (From admission, onward)    Start     Dose/Rate Route Frequency Ordered Stop   09/25/23 0900  ciprofloxacin  (CIPRO ) tablet 500 mg        500 mg Oral 2 times daily 09/25/23 0811     09/25/23 0900  metroNIDAZOLE  (FLAGYL ) tablet 500 mg        500 mg Oral Every 8 hours 09/25/23 0811     09/19/23 2200  piperacillin -tazobactam (ZOSYN ) IVPB 3.375 g  Status:  Discontinued        3.375 g 12.5 mL/hr over 240 Minutes Intravenous Every 8 hours 09/19/23 1045 09/25/23 0811   09/18/23 1030  meropenem  (MERREM ) 1 g in sodium chloride  0.9 % 100 mL IVPB  Status:  Discontinued        1 g 200 mL/hr over 30 Minutes Intravenous Every 12 hours 09/18/23 0934 09/19/23 1045   09/18/23 1000  ciprofloxacin  (CIPRO ) IVPB 400 mg  Status:  Discontinued        400 mg 200 mL/hr over 60 Minutes Intravenous Every 24 hours 09/17/23 1140 09/18/23 0833   09/17/23 0600  ciprofloxacin  (CIPRO ) IVPB 400 mg  Status:  Discontinued        400 mg 200 mL/hr over 60 Minutes Intravenous Every 12 hours 09/16/23 1954 09/17/23 1140   09/17/23 0600  metroNIDAZOLE  (FLAGYL ) IVPB 500 mg  Status:  Discontinued        500 mg 100 mL/hr over 60 Minutes Intravenous Every 12 hours 09/16/23 1954 09/18/23 0833   09/16/23 1815  ciprofloxacin  (CIPRO ) IVPB 400 mg        400 mg 200 mL/hr over 60 Minutes Intravenous  Once 09/16/23 1805 09/16/23 2013   09/16/23 1815  metroNIDAZOLE  (FLAGYL ) IVPB 500 mg        500 mg 100 mL/hr over 60 Minutes Intravenous  Once 09/16/23 1805 09/16/23 2014       Assessment/Plan: Impression: Sigmoid diverticulitis with coloenteric fistula.  She did have a pelvic abscess which has resolved.  It did grow out E. coli and Pseudomonas. Plan: Okay for discharge from surgery standpoint.  Would continue ciprofloxacin  and Flagyl  for 1 week.  This was discussed with Dr. Mason Sole.  May start anticoagulation.  Will see her in my office in  follow-up in 2 weeks.  LOS: 12 days    [name] 09/28/2023

## 2023-09-29 ENCOUNTER — Other Ambulatory Visit: Payer: Self-pay | Admitting: General Surgery

## 2023-09-29 DIAGNOSIS — K5792 Diverticulitis of intestine, part unspecified, without perforation or abscess without bleeding: Secondary | ICD-10-CM

## 2023-09-30 ENCOUNTER — Telehealth: Payer: Self-pay | Admitting: *Deleted

## 2023-09-30 NOTE — Telephone Encounter (Signed)
 Received call from patient sister and emergency contact, Cathy.  Reports that patient has increased edema to BLE most significantly her feet and ankles. Requested Rx for Lasix.   Advised to contact PCP or Nephrology.

## 2023-10-05 ENCOUNTER — Encounter (HOSPITAL_COMMUNITY): Payer: Self-pay

## 2023-10-07 ENCOUNTER — Inpatient Hospital Stay (HOSPITAL_COMMUNITY)
Admission: EM | Admit: 2023-10-07 | Discharge: 2023-10-27 | DRG: 329 | Disposition: A | Discharge status: Skilled Nursing Facility | Attending: Internal Medicine | Admitting: Internal Medicine

## 2023-10-07 ENCOUNTER — Emergency Department (HOSPITAL_COMMUNITY)

## 2023-10-07 ENCOUNTER — Other Ambulatory Visit: Payer: Self-pay

## 2023-10-07 ENCOUNTER — Encounter (HOSPITAL_COMMUNITY): Payer: Self-pay | Admitting: *Deleted

## 2023-10-07 DIAGNOSIS — Z9049 Acquired absence of other specified parts of digestive tract: Secondary | ICD-10-CM | POA: Diagnosis not present

## 2023-10-07 DIAGNOSIS — Z79890 Hormone replacement therapy (postmenopausal): Secondary | ICD-10-CM | POA: Diagnosis not present

## 2023-10-07 DIAGNOSIS — K219 Gastro-esophageal reflux disease without esophagitis: Secondary | ICD-10-CM | POA: Diagnosis present

## 2023-10-07 DIAGNOSIS — K921 Melena: Secondary | ICD-10-CM | POA: Diagnosis not present

## 2023-10-07 DIAGNOSIS — M7989 Other specified soft tissue disorders: Secondary | ICD-10-CM | POA: Diagnosis not present

## 2023-10-07 DIAGNOSIS — Z6834 Body mass index (BMI) 34.0-34.9, adult: Secondary | ICD-10-CM

## 2023-10-07 DIAGNOSIS — N189 Chronic kidney disease, unspecified: Secondary | ICD-10-CM

## 2023-10-07 DIAGNOSIS — I129 Hypertensive chronic kidney disease with stage 1 through stage 4 chronic kidney disease, or unspecified chronic kidney disease: Secondary | ICD-10-CM | POA: Diagnosis present

## 2023-10-07 DIAGNOSIS — N321 Vesicointestinal fistula: Secondary | ICD-10-CM | POA: Diagnosis present

## 2023-10-07 DIAGNOSIS — K5721 Diverticulitis of large intestine with perforation and abscess with bleeding: Principal | ICD-10-CM | POA: Diagnosis present

## 2023-10-07 DIAGNOSIS — R339 Retention of urine, unspecified: Secondary | ICD-10-CM | POA: Diagnosis not present

## 2023-10-07 DIAGNOSIS — Z7901 Long term (current) use of anticoagulants: Secondary | ICD-10-CM

## 2023-10-07 DIAGNOSIS — K5792 Diverticulitis of intestine, part unspecified, without perforation or abscess without bleeding: Secondary | ICD-10-CM | POA: Diagnosis not present

## 2023-10-07 DIAGNOSIS — K259 Gastric ulcer, unspecified as acute or chronic, without hemorrhage or perforation: Secondary | ICD-10-CM | POA: Diagnosis not present

## 2023-10-07 DIAGNOSIS — K651 Peritoneal abscess: Secondary | ICD-10-CM | POA: Diagnosis present

## 2023-10-07 DIAGNOSIS — N179 Acute kidney failure, unspecified: Secondary | ICD-10-CM | POA: Diagnosis present

## 2023-10-07 DIAGNOSIS — M4856XA Collapsed vertebra, not elsewhere classified, lumbar region, initial encounter for fracture: Secondary | ICD-10-CM | POA: Diagnosis present

## 2023-10-07 DIAGNOSIS — E66811 Obesity, class 1: Secondary | ICD-10-CM | POA: Diagnosis present

## 2023-10-07 DIAGNOSIS — R578 Other shock: Secondary | ICD-10-CM | POA: Diagnosis not present

## 2023-10-07 DIAGNOSIS — I480 Paroxysmal atrial fibrillation: Secondary | ICD-10-CM | POA: Diagnosis present

## 2023-10-07 DIAGNOSIS — K632 Fistula of intestine: Principal | ICD-10-CM | POA: Diagnosis present

## 2023-10-07 DIAGNOSIS — Z79899 Other long term (current) drug therapy: Secondary | ICD-10-CM | POA: Diagnosis not present

## 2023-10-07 DIAGNOSIS — Z888 Allergy status to other drugs, medicaments and biological substances status: Secondary | ICD-10-CM

## 2023-10-07 DIAGNOSIS — N1831 Chronic kidney disease, stage 3a: Secondary | ICD-10-CM | POA: Diagnosis present

## 2023-10-07 DIAGNOSIS — E8809 Other disorders of plasma-protein metabolism, not elsewhere classified: Secondary | ICD-10-CM | POA: Diagnosis present

## 2023-10-07 DIAGNOSIS — E781 Pure hyperglyceridemia: Secondary | ICD-10-CM | POA: Diagnosis not present

## 2023-10-07 DIAGNOSIS — E039 Hypothyroidism, unspecified: Secondary | ICD-10-CM | POA: Diagnosis present

## 2023-10-07 DIAGNOSIS — T8119XA Other postprocedural shock, initial encounter: Secondary | ICD-10-CM | POA: Diagnosis not present

## 2023-10-07 DIAGNOSIS — K5720 Diverticulitis of large intestine with perforation and abscess without bleeding: Secondary | ICD-10-CM | POA: Diagnosis not present

## 2023-10-07 DIAGNOSIS — E8770 Fluid overload, unspecified: Secondary | ICD-10-CM | POA: Diagnosis not present

## 2023-10-07 DIAGNOSIS — E876 Hypokalemia: Secondary | ICD-10-CM

## 2023-10-07 DIAGNOSIS — D72829 Elevated white blood cell count, unspecified: Secondary | ICD-10-CM | POA: Diagnosis not present

## 2023-10-07 DIAGNOSIS — I483 Typical atrial flutter: Secondary | ICD-10-CM

## 2023-10-07 DIAGNOSIS — D62 Acute posthemorrhagic anemia: Secondary | ICD-10-CM | POA: Diagnosis not present

## 2023-10-07 DIAGNOSIS — E860 Dehydration: Secondary | ICD-10-CM | POA: Diagnosis present

## 2023-10-07 DIAGNOSIS — Z90710 Acquired absence of both cervix and uterus: Secondary | ICD-10-CM | POA: Diagnosis not present

## 2023-10-07 DIAGNOSIS — K5732 Diverticulitis of large intestine without perforation or abscess without bleeding: Secondary | ICD-10-CM | POA: Diagnosis not present

## 2023-10-07 DIAGNOSIS — K56600 Partial intestinal obstruction, unspecified as to cause: Secondary | ICD-10-CM | POA: Diagnosis not present

## 2023-10-07 DIAGNOSIS — K920 Hematemesis: Secondary | ICD-10-CM | POA: Diagnosis not present

## 2023-10-07 DIAGNOSIS — K922 Gastrointestinal hemorrhage, unspecified: Secondary | ICD-10-CM | POA: Diagnosis not present

## 2023-10-07 DIAGNOSIS — I4891 Unspecified atrial fibrillation: Secondary | ICD-10-CM | POA: Diagnosis not present

## 2023-10-07 DIAGNOSIS — B379 Candidiasis, unspecified: Secondary | ICD-10-CM | POA: Diagnosis present

## 2023-10-07 HISTORY — DX: Diverticulosis of intestine, part unspecified, without perforation or abscess without bleeding: K57.90

## 2023-10-07 LAB — CBC WITH DIFFERENTIAL/PLATELET
Abs Immature Granulocytes: 0.23 10*3/uL — ABNORMAL HIGH (ref 0.00–0.07)
Basophils Absolute: 0 10*3/uL (ref 0.0–0.1)
Basophils Relative: 0 %
Eosinophils Absolute: 0 10*3/uL (ref 0.0–0.5)
Eosinophils Relative: 0 %
HCT: 33.5 % — ABNORMAL LOW (ref 36.0–46.0)
Hemoglobin: 10.6 g/dL — ABNORMAL LOW (ref 12.0–15.0)
Immature Granulocytes: 2 %
Lymphocytes Relative: 6 %
Lymphs Abs: 0.6 10*3/uL — ABNORMAL LOW (ref 0.7–4.0)
MCH: 30.5 pg (ref 26.0–34.0)
MCHC: 31.6 g/dL (ref 30.0–36.0)
MCV: 96.5 fL (ref 80.0–100.0)
Monocytes Absolute: 0.5 10*3/uL (ref 0.1–1.0)
Monocytes Relative: 5 %
Neutro Abs: 9.8 10*3/uL — ABNORMAL HIGH (ref 1.7–7.7)
Neutrophils Relative %: 87 %
Platelets: 304 10*3/uL (ref 150–400)
RBC: 3.47 MIL/uL — ABNORMAL LOW (ref 3.87–5.11)
RDW: 14.6 % (ref 11.5–15.5)
WBC: 11.2 10*3/uL — ABNORMAL HIGH (ref 4.0–10.5)
nRBC: 0 % (ref 0.0–0.2)

## 2023-10-07 LAB — URINALYSIS, ROUTINE W REFLEX MICROSCOPIC
Bacteria, UA: NONE SEEN
Bilirubin Urine: NEGATIVE
Glucose, UA: NEGATIVE mg/dL
Ketones, ur: 20 mg/dL — AB
Leukocytes,Ua: NEGATIVE
Nitrite: NEGATIVE
Protein, ur: NEGATIVE mg/dL
Specific Gravity, Urine: 1.021 (ref 1.005–1.030)
pH: 5 (ref 5.0–8.0)

## 2023-10-07 LAB — COMPREHENSIVE METABOLIC PANEL WITH GFR
ALT: 14 U/L (ref 0–44)
AST: 22 U/L (ref 15–41)
Albumin: 2.7 g/dL — ABNORMAL LOW (ref 3.5–5.0)
Alkaline Phosphatase: 53 U/L (ref 38–126)
Anion gap: 14 (ref 5–15)
BUN: 17 mg/dL (ref 8–23)
CO2: 22 mmol/L (ref 22–32)
Calcium: 8.2 mg/dL — ABNORMAL LOW (ref 8.9–10.3)
Chloride: 98 mmol/L (ref 98–111)
Creatinine, Ser: 1.91 mg/dL — ABNORMAL HIGH (ref 0.44–1.00)
GFR, Estimated: 27 mL/min — ABNORMAL LOW (ref >60)
Glucose, Bld: 128 mg/dL — ABNORMAL HIGH (ref 70–99)
Potassium: 2.9 mmol/L — ABNORMAL LOW (ref 3.5–5.1)
Sodium: 134 mmol/L — ABNORMAL LOW (ref 135–145)
Total Bilirubin: 0.7 mg/dL (ref 0.0–1.2)
Total Protein: 5.6 g/dL — ABNORMAL LOW (ref 6.5–8.1)

## 2023-10-07 LAB — LIPASE, BLOOD: Lipase: 32 U/L (ref 11–51)

## 2023-10-07 LAB — MAGNESIUM: Magnesium: 1.6 mg/dL — ABNORMAL LOW (ref 1.7–2.4)

## 2023-10-07 MED ORDER — LEVOTHYROXINE SODIUM 25 MCG PO TABS
50.0000 ug | ORAL_TABLET | Freq: Every day | ORAL | Status: DC
  Administered 2023-10-08 – 2023-10-13 (×5): 50 ug via ORAL
  Filled 2023-10-07 (×2): qty 1
  Filled 2023-10-07 (×5): qty 2

## 2023-10-07 MED ORDER — AMIODARONE HCL 200 MG PO TABS
200.0000 mg | ORAL_TABLET | Freq: Every day | ORAL | Status: DC
  Administered 2023-10-07: 200 mg via ORAL
  Filled 2023-10-07 (×2): qty 1

## 2023-10-07 MED ORDER — ACETAMINOPHEN 325 MG PO TABS: 650.0000 mg | ORAL_TABLET | Freq: Four times a day (QID) | ORAL | Status: DC | PRN

## 2023-10-07 MED ORDER — ACETAMINOPHEN 650 MG RE SUPP: 650.0000 mg | Freq: Four times a day (QID) | RECTAL | Status: DC | PRN

## 2023-10-07 MED ORDER — MAGNESIUM SULFATE IN D5W 1-5 GM/100ML-% IV SOLN
1.0000 g | Freq: Once | INTRAVENOUS | Status: AC
  Administered 2023-10-07: 1 g via INTRAVENOUS
  Filled 2023-10-07: qty 100

## 2023-10-07 MED ORDER — FENTANYL CITRATE PF 50 MCG/ML IJ SOSY
50.0000 ug | PREFILLED_SYRINGE | Freq: Once | INTRAMUSCULAR | Status: AC
  Administered 2023-10-07: 50 ug via INTRAVENOUS
  Filled 2023-10-07: qty 1

## 2023-10-07 MED ORDER — DOXAZOSIN MESYLATE 2 MG PO TABS
8.0000 mg | ORAL_TABLET | Freq: Every day | ORAL | Status: DC
  Administered 2023-10-08 – 2023-10-09 (×2): 8 mg via ORAL
  Filled 2023-10-07 (×3): qty 4

## 2023-10-07 MED ORDER — ONDANSETRON HCL 4 MG/2ML IJ SOLN
4.0000 mg | Freq: Four times a day (QID) | INTRAMUSCULAR | Status: DC | PRN
Start: 2023-10-07 — End: 2023-10-27
  Administered 2023-10-07 – 2023-10-22 (×17): 4 mg via INTRAVENOUS
  Filled 2023-10-07 (×18): qty 2

## 2023-10-07 MED ORDER — ONDANSETRON HCL 4 MG/2ML IJ SOLN
4.0000 mg | Freq: Once | INTRAMUSCULAR | Status: AC
  Administered 2023-10-07: 4 mg via INTRAVENOUS
  Filled 2023-10-07: qty 2

## 2023-10-07 MED ORDER — ONDANSETRON HCL 4 MG PO TABS: 4.0000 mg | ORAL_TABLET | Freq: Four times a day (QID) | ORAL | Status: DC | PRN

## 2023-10-07 MED ORDER — ZOLPIDEM TARTRATE 5 MG PO TABS
5.0000 mg | ORAL_TABLET | Freq: Every evening | ORAL | Status: DC | PRN
  Administered 2023-10-09 – 2023-10-12 (×4): 5 mg via ORAL
  Filled 2023-10-07 (×4): qty 1

## 2023-10-07 MED ORDER — SODIUM CHLORIDE 0.9 % IV SOLN: INTRAVENOUS | Status: AC

## 2023-10-07 MED ORDER — HYDROMORPHONE HCL 1 MG/ML IJ SOLN
1.0000 mg | INTRAMUSCULAR | Status: DC | PRN
  Administered 2023-10-07 – 2023-10-13 (×11): 1 mg via INTRAVENOUS
  Filled 2023-10-07 (×12): qty 1

## 2023-10-07 MED ORDER — PIPERACILLIN-TAZOBACTAM 3.375 G IVPB
3.3750 g | Freq: Three times a day (TID) | INTRAVENOUS | Status: AC
  Administered 2023-10-07 – 2023-10-19 (×35): 3.375 g via INTRAVENOUS
  Filled 2023-10-07 (×36): qty 50

## 2023-10-07 MED ORDER — POTASSIUM CHLORIDE 10 MEQ/100ML IV SOLN
10.0000 meq | Freq: Once | INTRAVENOUS | Status: AC
  Administered 2023-10-07: 10 meq via INTRAVENOUS
  Filled 2023-10-07: qty 100

## 2023-10-07 MED ORDER — METOPROLOL TARTRATE 25 MG PO TABS
25.0000 mg | ORAL_TABLET | Freq: Two times a day (BID) | ORAL | Status: DC
  Administered 2023-10-07 – 2023-10-08 (×3): 25 mg via ORAL
  Filled 2023-10-07 (×3): qty 1

## 2023-10-07 MED ORDER — SODIUM CHLORIDE 0.9 % IV BOLUS
1000.0000 mL | Freq: Once | INTRAVENOUS | Status: AC
  Administered 2023-10-07: 1000 mL via INTRAVENOUS

## 2023-10-07 MED ORDER — HEPARIN (PORCINE) 25000 UT/250ML-% IV SOLN
1100.0000 [IU]/h | INTRAVENOUS | Status: DC
  Administered 2023-10-07: 1100 [IU]/h via INTRAVENOUS
  Filled 2023-10-07: qty 250

## 2023-10-07 MED ORDER — POTASSIUM CHLORIDE CRYS ER 20 MEQ PO TBCR
40.0000 meq | EXTENDED_RELEASE_TABLET | ORAL | Status: AC
  Administered 2023-10-07 – 2023-10-08 (×2): 40 meq via ORAL
  Filled 2023-10-07 (×2): qty 2

## 2023-10-07 NOTE — Progress Notes (Signed)
 PHARMACY - ANTICOAGULATION CONSULT NOTE  Pharmacy Consult for heparin  Indication: atrial fibrillation  Allergies  Allergen Reactions   Amlodipine Swelling   Clonidine Rash    Rash with patch only.  Okay to take pill    Patient Measurements: Height: 5\' 4"  (162.6 cm) Weight: 85.3 kg (188 lb 0.8 oz) IBW/kg (Calculated) : 54.7 HEPARIN  DW (KG): 73.5  Vital Signs: Temp: 97.6 F (36.4 C) (05/09 0914) Temp Source: Oral (05/09 0914) BP: 133/52 (05/09 1400) Pulse Rate: 66 (05/09 1400)  Labs: Recent Labs    10/07/23 0919  HGB 10.6*  HCT 33.5*  PLT 304  CREATININE 1.91*    Estimated Creatinine Clearance: 25.6 mL/min (A) (by C-G formula based on SCr of 1.91 mg/dL (H)).   Medical History: Past Medical History:  Diagnosis Date   Diverticulosis    HTN (hypertension)    Hypothyroidism     Medications:  (Not in a hospital admission)   Assessment: Pharmacy consulted to dose heparin  in patient with atrial fibrillation. Patient is on Eliquis  prior to admission with last dose 5/8 @ 2030.  CBC WNL  Goal of Therapy:  Heparin  level 0.3-0.7 units/ml aPTT 66-102 seconds Monitor platelets by anticoagulation protocol: Yes   Plan:  No bolus Start heparin  infusion at 1100 units/hr Check anti-Xa level in 6-8 hours and daily while on heparin  Continue to monitor H&H and platelets  Cliffton Dama, PharmD Clinical Pharmacist 10/07/2023 2:48 PM

## 2023-10-07 NOTE — Progress Notes (Signed)
   10/07/23 2206  TOC Brief Assessment  Insurance and Status Reviewed  Patient has primary care physician Yes  Home environment has been reviewed From home  Prior level of function: Independent  Prior/Current Home Services Current home services  Social Drivers of Health Review SDOH reviewed no interventions necessary  Readmission risk has been reviewed Yes  Transition of care needs no transition of care needs at this time   Possible surgery 10/10/23. Pt active [facility] [place], [place] and [place], [employer], [place] for DME. Transition of Care Department [facility]) has reviewed patient and will continue to monitor.

## 2023-10-07 NOTE — ED Provider Notes (Signed)
 [place] EMERGENCY DEPARTMENT AT [facility] Provider Note   CSN: [id number] Arrival date & time: 10/07/23  0831     History  Chief Complaint  Patient presents with   Abdominal Pain    Felicia Frank is a 78 y.o. female with a history including hypertension, GERD, hypothyroidism, chronic kidney disease and history of diverticulitis who was admitted here on April 18 secondary to diverticulitis with abscess and enterovesicular fistula who underwent percutaneous abscess drainage, discharged home with antibiotics, states her pain has never improved, has had nausea with emesis and has generalized weakness, dehydration.  She had a very small soft stool ytd. She was seen at [facility] 2 days ago at which time she was diagnosed with a UTI and placed on Macrobid, was also told she had a yeast infection, has been unable to tolerate any p.o. intake in the past several days.  Denies fevers.  Was scheduled to follow-up with Dr. Larrie Po in 4 days.  The history is provided by the patient and a relative.       Home Medications Prior to Admission medications   Medication Sig Start Date End Date Taking? Authorizing Provider  amiodarone  (PACERONE ) 200 MG tablet Take 1 tablet (200 mg total) by mouth daily. 09/29/23 11/28/23 Yes Shah, Pratik D, DO  apixaban  (ELIQUIS ) 5 MG TABS tablet Take 1 tablet (5 mg total) by mouth 2 (two) times daily. 09/28/23  Yes Shah, Pratik D, DO  doxazosin  (CARDURA ) 8 MG tablet Take 8 mg by mouth at bedtime. 12/02/20  Yes [provider]  levothyroxine  (SYNTHROID ) 50 MCG tablet Take 50 mcg by mouth daily. 03/15/17  Yes [provider]  metoprolol  tartrate (LOPRESSOR ) 50 MG tablet Take 1 tablet (50 mg total) by mouth 2 (two) times daily. 09/28/23 11/27/23 Yes Shah, Pratik D, DO  nitrofurantoin, macrocrystal-monohydrate, (MACROBID) 100 MG capsule Take 100 mg by mouth 2 (two) times daily. 10/06/23  Yes [provider]  ondansetron  (ZOFRAN ) 4 MG  tablet Take 1 tablet (4 mg total) by mouth daily as needed for nausea or vomiting. 09/28/23 09/27/24 Yes Shah, Pratik D, DO  senna-docusate (SENOKOT-S) 8.6-50 MG tablet Take 2 tablets by mouth 2 (two) times daily. 09/28/23 10/28/23 Yes Shah, Pratik D, DO  traMADol  (ULTRAM ) 50 MG tablet Take 1 tablet (50 mg total) by mouth every 12 (twelve) hours as needed. 09/28/23  Yes [name], MD  zolpidem  (AMBIEN ) 10 MG tablet Take 10 mg by mouth at bedtime as needed for sleep. 12/11/20  Yes [provider]  estradiol (ESTRACE) 0.1 MG/GM vaginal cream Place 0.5 g vaginally as needed (vaginal dryness/irritation). Twice a week PRN 11/06/12   [provider]  furosemide (LASIX) 20 MG tablet Take 1 tablet every day by oral route for 30 days. Patient not taking: Reported on 10/07/2023 09/30/23   [provider]      Allergies    Amlodipine and Clonidine    Review of Systems   Review of Systems  Constitutional:  Positive for fatigue. Negative for chills and fever.  HENT:  Negative for congestion and sore throat.   Eyes: Negative.   Respiratory:  Negative for chest tightness and shortness of breath.   Cardiovascular:  Negative for chest pain.  Gastrointestinal:  Positive for abdominal distention, abdominal pain, nausea and vomiting.  Genitourinary: Negative.   Musculoskeletal:  Negative for arthralgias, joint swelling and neck pain.  Skin: Negative.  Negative for rash and wound.  Neurological:  Positive for weakness. Negative for dizziness,  light-headedness, numbness and headaches.  Psychiatric/Behavioral: Negative.      Physical Exam Updated Vital Signs BP (!) 133/52   Pulse 66   Temp 97.6 F (36.4 C) (Oral)   Resp 18   Ht 5\' 4"  (1.626 m)   Wt 85.3 kg   SpO2 98%   BMI 32.28 kg/m  Physical Exam Vitals and nursing note reviewed.  Constitutional:      Appearance: She is well-developed.  HENT:     Head: Normocephalic and atraumatic.     Mouth/Throat:     Mouth: Mucous  membranes are dry.  Eyes:     Conjunctiva/sclera: Conjunctivae normal.  Cardiovascular:     Rate and Rhythm: Normal rate and regular rhythm.     Heart sounds: Normal heart sounds.  Pulmonary:     Effort: Pulmonary effort is normal.     Breath sounds: Normal breath sounds. No wheezing.  Abdominal:     General: Bowel sounds are normal. There is distension.     Palpations: Abdomen is soft.     Tenderness: There is generalized abdominal tenderness. There is guarding.  Musculoskeletal:        General: Normal range of motion.     Cervical back: Normal range of motion.  Skin:    General: Skin is warm and dry.  Neurological:     Mental Status: She is alert.     ED Results / Procedures / Treatments   Labs (all labs ordered are listed, but only abnormal results are displayed) Labs Reviewed  CBC WITH DIFFERENTIAL/PLATELET - Abnormal; Notable for the following components:      Result Value   WBC 11.2 (*)    RBC 3.47 (*)    Hemoglobin 10.6 (*)    HCT 33.5 (*)    Neutro Abs 9.8 (*)    Lymphs Abs 0.6 (*)    Abs Immature Granulocytes 0.23 (*)    All other components within normal limits  COMPREHENSIVE METABOLIC PANEL WITH GFR - Abnormal; Notable for the following components:   Sodium 134 (*)    Potassium 2.9 (*)    Glucose, Bld 128 (*)    Creatinine, Ser 1.91 (*)    Calcium 8.2 (*)    Total Protein 5.6 (*)    Albumin 2.7 (*)    GFR, Estimated 27 (*)    All other components within normal limits  URINALYSIS, ROUTINE W REFLEX MICROSCOPIC - Abnormal; Notable for the following components:   Hgb urine dipstick MODERATE (*)    Ketones, ur 20 (*)    All other components within normal limits  LIPASE, BLOOD    EKG None  Radiology CT ABDOMEN PELVIS WO CONTRAST Result Date: 10/07/2023 CLINICAL DATA:  Diverticulitis EXAM: CT ABDOMEN AND PELVIS WITHOUT CONTRAST TECHNIQUE: Multidetector CT imaging of the abdomen and pelvis was performed following the standard protocol without IV contrast.  RADIATION DOSE REDUCTION: This exam was performed according to the departmental dose-optimization program which includes automated exposure control, adjustment of the mA and/or kV according to patient size and/or use of iterative reconstruction technique. COMPARISON:  CT of the abdomen and pelvis performed September 26, 2023 FINDINGS: Lower chest: Trace pleural effusions. Hepatobiliary: No focal liver lesion. The gallbladder surgically absent. Pancreas: Unremarkable. No pancreatic ductal dilatation or surrounding inflammatory changes. Spleen: Normal in size without focal abnormality. Adrenals/Urinary Tract: Persistent nephrograms. Right lower pole renal cyst. No hydronephrosis. Residual contrast agent is present within the urinary bladder. Stomach/Bowel: Small hiatal hernia. Stomach is nondilated. The proximal small bowel  is decompressed. Within the mid small bowel, there is mild diffuse gaseous distension. There is a transition from gaseous distension to decompressed in the central pelvis which is annotated on image 70 of series 2. Beyond this level, the small-bowel D comes decompressed and small in caliber. The terminal ileum is nondilated. Liquid and contrast containing stool material is than present within the right and transverse colon. This is moderate to large in volume. Contrast containing stool material is present in the distal colon. The rectum is largely decompressed. There is a small central air and fluid containing collection which is best seen on image 66 of series 2 which is estimated at 3.9 x 3.6 cm. This is adjacent to the prior catheter site (and previously contained air). Vascular/Lymphatic: Not significantly changed. Reproductive: Unchanged Other: Diffuse anasarca. Musculoskeletal: Degenerative changes in the imaged osseous structures, stable. IMPRESSION: 1. Mild diffuse gaseous distension of central loops of small bowel with a transition point in the pelvis, just proximal to the terminal ileum  suggestive of partial obstruction. 2. There is a small air and fluid containing collection in the central pelvis, adjacent to the previous drainage catheter, which measures 3.9 x 3.6 cm. This is not readily amenable to percutaneous access secondary to the central location and absence of access window. 3. A large volume of liquid stool material is present in the right and transverse colon. 4. Persistent nephrograms which can be observed in the setting of renal dysfunction. Electronically Signed   By: Reagan Camera M.D.   On: 10/07/2023 13:07    Procedures Procedures    Medications Ordered in ED Medications  piperacillin -tazobactam (ZOSYN ) IVPB 3.375 g (3.375 g Intravenous New Bag/Given 10/07/23 1420)  fentaNYL  (SUBLIMAZE ) injection 50 mcg (50 mcg Intravenous Given 10/07/23 1013)  ondansetron  (ZOFRAN ) injection 4 mg (4 mg Intravenous Given 10/07/23 1013)  sodium chloride  0.9 % bolus 1,000 mL (0 mLs Intravenous Stopped 10/07/23 1123)  potassium chloride 10 mEq in 100 mL IVPB (0 mEq Intravenous Stopped 10/07/23 1239)    ED Course/ Medical Decision Making/ A&P Clinical Course as of 10/07/23 1422  Fri Oct 07, 2023  1410 Creatinine(!): 1.91 Worsened from prior, baseline around 1.1-1.2, give IVF [SG]    Clinical Course User Index [SG] Teddi Favors, DO                                 Medical Decision Making Pt with known diverticulitis with complicating enterovesical fistula with persistent pain, n/v,  increasing weakness, presenting with dehydration,  acute kidney injury and no improvement since last admission. Seen at [facility] 2 days ago - uti, unable to keep abx down secondary to n/v.  Plan was to arrange elective surgery, but is not tolerating sx at home.  Pt will need admission.    Amount and/or Complexity of Data Reviewed Labs: ordered. Decision-making details documented in ED Course. Radiology: ordered. Discussion of management or test interpretation with external provider(s): Spoke  with Dr. Larrie Po who will see pt in am (Saturday) and plan surgery Monday.  In interim,  IV zosyn  after review of prior cultures.  She was diagnosed with new afib with last hospitalization - will need to be off eliquis .  Consider heparin  instead of eliquis  in anticipation of surgery.  Hospitalist admission.  Call placed to hospitalist. Discussed with Dr. Michaelene Admire who accepts pt for admission.  Risk Prescription drug management. Decision regarding hospitalization.  Final Clinical Impression(s) / ED Diagnoses Final diagnoses:  Acute diverticulitis  Fistula of large intestine  Hypokalemia  Acute kidney injury [facility])    Rx / DC Orders ED Discharge Orders     None         Katherine Pancake, PA-C 10/07/23 1423    Teddi Favors, DO 10/08/23 [id number]

## 2023-10-07 NOTE — ED Triage Notes (Signed)
 Pt c/o abdominal pain and vomiting that is ongoing since her last hospital visit. Granddaughter reports pt was recently in the hospital for diverticulitis, discharged about a week ago. Pt was seen at [facility] ED yesterday and told she had a UTI and yeast infection.

## 2023-10-07 NOTE — Plan of Care (Signed)

## 2023-10-07 NOTE — H&P (Signed)
 History and Physical    Patient: Felicia Frank:[id number] DOB: 23-Jul-1945 DOA: 10/07/2023 DOS: the patient was seen and examined on 10/07/2023 PCP: Allana Ishikawa, MD   Patient coming from: Home  Chief Complaint:  Chief Complaint  Patient presents with   Abdominal Pain   HPI: Felicia Frank is a 78 y.o. female with medical history significant of hypothyroidism, hypertension, chronic kidney disease stage IIIa, class I obesity, atrial fibrillation and recent hospitalization secondary to diverticulitis with perforation and contained abscess status post drain placement and enteral fistula formation; who presented to the hospital secondary to still ongoing abdominal pain, associated nausea/intermittent vomiting and difficulty keeping things down.  Workup in the ED demonstrating elevated WBCs and abnormal CT scan with concern for enteral fistula formation, contained abscess and diverticulitis.  Unfortunately given location of this new contained abscess no ability or safe margin for placement percutaneous drain.  Case was discussed with general surgery who recommended admission for IV antibiotics and anticipated surgical intervention on 10/10/2023.   Review of Systems: As mentioned in the history of present illness. All other systems reviewed and are negative.  Past Medical History:  Diagnosis Date   Diverticulosis    HTN (hypertension)    Hypothyroidism    Past Surgical History:  Procedure Laterality Date   ABDOMINAL HYSTERECTOMY     BACK SURGERY     CHOLECYSTECTOMY     TONSILLECTOMY     Social History:  reports that she has never smoked. She has never used smokeless tobacco. She reports that she does not drink alcohol and does not use drugs.  Allergies  Allergen Reactions   Amlodipine Swelling   Clonidine Rash    Rash with patch only.  Okay to take pill    Family History  Problem Relation Age of Onset   Colon cancer Neg Hx    Colon polyps Neg Hx     Prior to  Admission medications   Medication Sig Start Date End Date Taking? Authorizing Provider  amiodarone  (PACERONE ) 200 MG tablet Take 1 tablet (200 mg total) by mouth daily. 09/29/23 11/28/23 Yes Shah, Pratik D, DO  apixaban  (ELIQUIS ) 5 MG TABS tablet Take 1 tablet (5 mg total) by mouth 2 (two) times daily. 09/28/23  Yes Shah, Pratik D, DO  doxazosin  (CARDURA ) 8 MG tablet Take 8 mg by mouth at bedtime. 12/02/20  Yes [provider]  levothyroxine  (SYNTHROID ) 50 MCG tablet Take 50 mcg by mouth daily. 03/15/17  Yes [provider]  metoprolol  tartrate (LOPRESSOR ) 50 MG tablet Take 1 tablet (50 mg total) by mouth 2 (two) times daily. 09/28/23 11/27/23 Yes Shah, Pratik D, DO  ondansetron  (ZOFRAN ) 4 MG tablet Take 1 tablet (4 mg total) by mouth daily as needed for nausea or vomiting. 09/28/23 09/27/24 Yes Shah, Pratik D, DO  senna-docusate (SENOKOT-S) 8.6-50 MG tablet Take 2 tablets by mouth 2 (two) times daily. 09/28/23 10/28/23 Yes Shah, Pratik D, DO  traMADol  (ULTRAM ) 50 MG tablet Take 1 tablet (50 mg total) by mouth every 12 (twelve) hours as needed. 09/28/23  Yes [name], MD  zolpidem  (AMBIEN ) 10 MG tablet Take 10 mg by mouth at bedtime as needed for sleep. 12/11/20  Yes [provider]  estradiol (ESTRACE) 0.1 MG/GM vaginal cream Place 0.5 g vaginally as needed (vaginal dryness/irritation). Twice a week PRN 11/06/12   [provider]  furosemide (LASIX) 20 MG tablet Take 1 tablet every day by oral route for 30 days. Patient not taking: Reported on 10/07/2023  09/30/23   [provider]    Physical Exam: Vitals:   10/07/23 0911 10/07/23 0914 10/07/23 1100 10/07/23 1400  BP:  (!) 124/59 (!) 129/56 (!) 133/52  Pulse:  63 66 66  Resp:  18 18 18   Temp:  97.6 F (36.4 C)    TempSrc:  Oral    SpO2:  97% 98% 98%  Weight: 85.3 kg     Height: 5\' 4"  (1.626 m)      General exam: Alert, awake, oriented x 3; in no major distress.  Afebrile. Respiratory system: Clear to  auscultation. Respiratory effort normal.  Good saturation on room air. Cardiovascular system:RRR. No murmurs, rubs, gallops. Gastrointestinal system: Abdomen is nondistended, soft and nontender. No organomegaly or masses felt. Normal bowel sounds heard. Central nervous system: Alert and oriented. No focal neurological deficits. Extremities: No C/C/E, +pedal pulses Skin: No rashes, lesions or ulcers Psychiatry: Judgement and insight appear normal. Mood & affect appropriate.    Data Reviewed: CBC: WBCs 11.2, hemoglobin 10.6 and platelet count 304K Comprehensive metabolic panel: Sodium 134, potassium 2.9, chloride 98, bicarb 22, BUN 17, creatinine 1.91 and GFR 27 Lipase: 32 Magnesium : 1.6 Urinalysis: Demonstrating the presence of some mucus, no bacteria, negative nitrite, negative protein and +11-20 red blood cells per campus.  Assessment and Plan: 1-diverticulitis of the colon with perforation and enterocolitis fistula - Full liquid diet in order to provide some bowel rest will be provided. - Continue as needed analgesics and as needed antiemetics - IV antibiotic has been started - Gentle fluid resuscitation will be provided - Continue electrolyte repletion and follow clinical response. - General Surgery has been consulted with plans for surgical intervention on 10/10/2023.  2-paroxysmal atrial fibrillation - Rate control, stable and currently sinus - Will use heparin  for secondary prevention; holding Eliquis  in anticipation for surgery and to allow washout - Continue treatment with metoprolol  and amiodarone  - Telemetry monitoring has been ordered.  3-hypothyroidism - Continue Synthroid .  4-essential hypertension - Stable and well-controlled - Continue current antihypertensive agents - Follow vital signs.  5-acute kidney injury on chronic kidney disease stage IIIa - Continue minimizing nephrotoxic agent - Avoid the use of contrast and also avoid hypotension - Will provide fluid  resuscitation and follow renal function trend.  6-GERD - Continue PPI.  7-compression fracture of L1 - Continue as needed analgesics.  8-class I obesity -Body mass index is 32.28 kg/m. -Low-calorie diet and portion control discussed with patient.    Advance Care Planning:   Code Status: Full Code   Consults: General Surgery  Family Communication: No family at bedside.  Severity of Illness: The appropriate patient status for this patient is INPATIENT. Inpatient status is judged to be reasonable and necessary in order to provide the required intensity of service to ensure the patient's safety. The patient's presenting symptoms, physical exam findings, and initial radiographic and laboratory data in the context of their chronic comorbidities is felt to place them at high risk for further clinical deterioration. Furthermore, it is not anticipated that the patient will be medically stable for discharge from the hospital within 2 midnights of admission.   * I certify that at the point of admission it is my clinical judgment that the patient will require inpatient hospital care spanning beyond 2 midnights from the point of admission due to high intensity of service, high risk for further deterioration and high frequency of surveillance required.*  Author: Justina Oman, MD 10/07/2023 2:42 PM  For on call review [email or web address].

## 2023-10-08 DIAGNOSIS — N179 Acute kidney failure, unspecified: Secondary | ICD-10-CM | POA: Diagnosis not present

## 2023-10-08 DIAGNOSIS — K5792 Diverticulitis of intestine, part unspecified, without perforation or abscess without bleeding: Secondary | ICD-10-CM

## 2023-10-08 DIAGNOSIS — K5720 Diverticulitis of large intestine with perforation and abscess without bleeding: Secondary | ICD-10-CM | POA: Diagnosis not present

## 2023-10-08 DIAGNOSIS — K632 Fistula of intestine: Secondary | ICD-10-CM | POA: Diagnosis not present

## 2023-10-08 LAB — BASIC METABOLIC PANEL WITH GFR
Anion gap: 9 (ref 5–15)
BUN: 18 mg/dL (ref 8–23)
CO2: 24 mmol/L (ref 22–32)
Calcium: 7.8 mg/dL — ABNORMAL LOW (ref 8.9–10.3)
Chloride: 104 mmol/L (ref 98–111)
Creatinine, Ser: 1.6 mg/dL — ABNORMAL HIGH (ref 0.44–1.00)
GFR, Estimated: 33 mL/min — ABNORMAL LOW (ref >60)
Glucose, Bld: 113 mg/dL — ABNORMAL HIGH (ref 70–99)
Potassium: 3.8 mmol/L (ref 3.5–5.1)
Sodium: 137 mmol/L (ref 135–145)

## 2023-10-08 LAB — CBC
HCT: 30.5 % — ABNORMAL LOW (ref 36.0–46.0)
Hemoglobin: 9.3 g/dL — ABNORMAL LOW (ref 12.0–15.0)
MCH: 30.3 pg (ref 26.0–34.0)
MCHC: 30.5 g/dL (ref 30.0–36.0)
MCV: 99.3 fL (ref 80.0–100.0)
Platelets: 281 10*3/uL (ref 150–400)
RBC: 3.07 MIL/uL — ABNORMAL LOW (ref 3.87–5.11)
RDW: 15 % (ref 11.5–15.5)
WBC: 10.8 10*3/uL — ABNORMAL HIGH (ref 4.0–10.5)
nRBC: 0 % (ref 0.0–0.2)

## 2023-10-08 LAB — APTT
aPTT: >200 s (ref 24–36)
aPTT: >200 s (ref 24–36)
aPTT: 53 s — ABNORMAL HIGH (ref 24–36)

## 2023-10-08 LAB — HEPARIN LEVEL (UNFRACTIONATED): Heparin Unfractionated: >1.1 [IU]/mL — ABNORMAL HIGH (ref 0.30–0.70)

## 2023-10-08 MED ORDER — HEPARIN (PORCINE) 25000 UT/250ML-% IV SOLN
550.0000 [IU]/h | INTRAVENOUS | Status: AC
  Administered 2023-10-11: 550 [IU]/h via INTRAVENOUS
  Filled 2023-10-08 (×2): qty 250

## 2023-10-08 MED ORDER — HEPARIN (PORCINE) 25000 UT/250ML-% IV SOLN
800.0000 [IU]/h | INTRAVENOUS | Status: DC
  Administered 2023-10-08: 800 [IU]/h via INTRAVENOUS

## 2023-10-08 MED ORDER — PROCHLORPERAZINE EDISYLATE 10 MG/2ML IJ SOLN
10.0000 mg | Freq: Four times a day (QID) | INTRAMUSCULAR | Status: DC | PRN
  Administered 2023-10-08 – 2023-10-24 (×11): 10 mg via INTRAVENOUS
  Filled 2023-10-08 (×11): qty 2

## 2023-10-08 MED ORDER — MILK AND MOLASSES ENEMA
1.0000 | Freq: Once | RECTAL | Status: AC
  Administered 2023-10-08: 240 mL via RECTAL

## 2023-10-08 NOTE — Consult Note (Signed)
 Reason for Consult: Sigmoid diverticulitis Referring Physician: Dr. Aubrey Leaf Felicia Frank is an 78 y.o. female.  HPI: Patient is a 78 year old white female well-known to me who presents back with fatigue and generalized malaise.  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  While at home, she states she has not had any significant bowel movements and has had decreased appetite.  She was nauseated.  She had previously seen in emergency room in [place] and then subsequently came here on 10/07/2023.  A repeat CT scan of the abdomen revealed a small fluid collection around the sigmoid colon, but is not approachable by IR.  Patient states that her appetite is decreased and she is not able to move around as well due to a recently diagnosed compression fracture of L1.  She had been taking her ciprofloxacin  and Flagyl  as an outpatient for E. coli and Pseudomonas that grew out of her pelvic abscess.  She has been taking her Eliquis  for her atrial fibrillation.  Past Medical History:  Diagnosis Date   Diverticulosis    HTN (hypertension)    Hypothyroidism     Past Surgical History:  Procedure Laterality Date   ABDOMINAL HYSTERECTOMY     BACK SURGERY     CHOLECYSTECTOMY     TONSILLECTOMY      Family History  Problem Relation Age of Onset   Colon cancer Neg Hx    Colon polyps Neg Hx     Social History:  reports that she has never smoked. She has never used smokeless tobacco. She reports that she does not drink alcohol and does not use drugs.  Allergies:  Allergies  Allergen Reactions   Amlodipine Swelling   Clonidine Rash    Rash with patch only.  Okay to take pill    Medications: I have reviewed the patient's current medications. Prior to Admission:  Medications Prior to Admission  Medication Sig Dispense Refill Last Dose/Taking    amiodarone  (PACERONE ) 200 MG tablet Take 1 tablet (200 mg total) by mouth daily. 30 tablet 1 10/06/2023   apixaban  (ELIQUIS ) 5 MG TABS tablet Take 1 tablet (5 mg total) by mouth 2 (two) times daily. 60 tablet 2 10/06/2023 at  8:30 PM   doxazosin  (CARDURA ) 8 MG tablet Take 8 mg by mouth at bedtime.   10/06/2023 Bedtime   levothyroxine  (SYNTHROID ) 50 MCG tablet Take 50 mcg by mouth daily.   10/06/2023 Morning   metoprolol  tartrate (LOPRESSOR ) 50 MG tablet Take 1 tablet (50 mg total) by mouth 2 (two) times daily. 60 tablet 1 10/06/2023 Evening   ondansetron  (ZOFRAN ) 4 MG tablet Take 1 tablet (4 mg total) by mouth daily as needed for nausea or vomiting. 30 tablet 1 Taking As Needed   senna-docusate (SENOKOT-S) 8.6-50 MG tablet Take 2 tablets by mouth 2 (two) times daily. 120 tablet 0 10/06/2023 Evening   traMADol  (ULTRAM ) 50 MG tablet Take 1 tablet (50 mg total) by mouth every 12 (twelve) hours as needed. 15 tablet 0 Taking As Needed   zolpidem  (AMBIEN ) 10 MG tablet Take 10 mg by mouth at bedtime as needed for sleep.   10/06/2023 Bedtime   estradiol (ESTRACE) 0.1 MG/GM vaginal cream Place 0.5 g vaginally as needed (vaginal dryness/irritation). Twice a week PRN      furosemide (LASIX) 20 MG tablet Take 1 tablet every day by oral route for 30 days. (  Patient not taking: Reported on 10/07/2023)   Not Taking    Results for orders placed or performed during the hospital encounter of 10/07/23 (from the past 48 hours)  CBC with Differential     Status: Abnormal   Collection Time: 10/07/23  9:19 AM  Result Value Ref Range   WBC 11.2 (H) 4.0 - 10.5 K/uL   RBC 3.47 (L) 3.87 - 5.11 MIL/uL   Hemoglobin 10.6 (L) 12.0 - 15.0 g/dL   HCT 60.4 (L) 54.0 - 98.1 %   MCV 96.5 80.0 - 100.0 fL   MCH 30.5 26.0 - 34.0 pg   MCHC 31.6 30.0 - 36.0 g/dL   RDW 19.1 47.8 - 29.5 %   Platelets 304 150 - 400 K/uL   nRBC 0.0 0.0 - 0.2 %   Neutrophils Relative % 87 %   Neutro Abs 9.8 (H) 1.7 - 7.7 K/uL   Lymphocytes Relative 6 %   Lymphs Abs  0.6 (L) 0.7 - 4.0 K/uL   Monocytes Relative 5 %   Monocytes Absolute 0.5 0.1 - 1.0 K/uL   Eosinophils Relative 0 %   Eosinophils Absolute 0.0 0.0 - 0.5 K/uL   Basophils Relative 0 %   Basophils Absolute 0.0 0.0 - 0.1 K/uL   Immature Granulocytes 2 %   Abs Immature Granulocytes 0.23 (H) 0.00 - 0.07 K/uL    Comment: Performed at [facility], [address], [place], [place] [address]  Comprehensive metabolic panel     Status: Abnormal   Collection Time: 10/07/23  9:19 AM  Result Value Ref Range   Sodium 134 (L) 135 - 145 mmol/L   Potassium 2.9 (L) 3.5 - 5.1 mmol/L   Chloride 98 98 - 111 mmol/L   CO2 22 22 - 32 mmol/L   Glucose, Bld 128 (H) 70 - 99 mg/dL    Comment: Glucose reference range applies only to samples taken after fasting for at least 8 hours.   BUN 17 8 - 23 mg/dL   Creatinine, Ser 8.65 (H) 0.44 - 1.00 mg/dL   Calcium 8.2 (L) 8.9 - 10.3 mg/dL   Total Protein 5.6 (L) 6.5 - 8.1 g/dL   Albumin 2.7 (L) 3.5 - 5.0 g/dL   AST 22 15 - 41 U/L   ALT 14 0 - 44 U/L   Alkaline Phosphatase 53 38 - 126 U/L   Total Bilirubin 0.7 0.0 - 1.2 mg/dL   GFR, Estimated 27 (L) >60 mL/min    Comment: (NOTE) Calculated using the CKD-EPI Creatinine Equation (2021)    Anion gap 14 5 - 15    Comment: Performed at [facility], [address], [place], [place] [address]  Lipase, blood     Status: None   Collection Time: 10/07/23  9:19 AM  Result Value Ref Range   Lipase 32 11 - 51 U/L    Comment: Performed at [facility], [address], [place], [place] [address]  Magnesium      Status: Abnormal   Collection Time: 10/07/23  9:19 AM  Result Value Ref Range   Magnesium  1.6 (L) 1.7 - 2.4 mg/dL    Comment: Performed at [facility], [address] [place], [place] [address]  Urinalysis, Routine w reflex microscopic -Urine, Clean Catch     Status: Abnormal   Collection Time: 10/07/23 12:38 PM  Result Value Ref Range   Color, Urine YELLOW YELLOW   APPearance CLEAR CLEAR   Specific Gravity, Urine  1.021 1.005 - 1.030   pH 5.0  5.0 - 8.0   Glucose, UA NEGATIVE NEGATIVE mg/dL   Hgb urine dipstick MODERATE (A) NEGATIVE   Bilirubin Urine NEGATIVE NEGATIVE   Ketones, ur 20 (A) NEGATIVE mg/dL   Protein, ur NEGATIVE NEGATIVE mg/dL   Nitrite NEGATIVE NEGATIVE   Leukocytes,Ua NEGATIVE NEGATIVE   RBC / HPF 11-20 0 - 5 RBC/hpf   WBC, UA 6-10 0 - 5 WBC/hpf   Bacteria, UA NONE SEEN NONE SEEN   Squamous Epithelial / HPF 0-5 0 - 5 /HPF   Mucus PRESENT     Comment: Performed at [facility], [address] [place], [place] [address]  APTT     Status: Abnormal   Collection Time: 10/07/23 10:44 PM  Result Value Ref Range   aPTT >200 (HH) 24 - 36 seconds    Comment:        IF BASELINE aPTT IS ELEVATED, SUGGEST PATIENT RISK ASSESSMENT BE USED TO DETERMINE APPROPRIATE ANTICOAGULANT THERAPY. REPEATED TO VERIFY CRITICAL RESULT CALLED TO, READ BACK BY AND VERIFIED WITH: HAIRSTON,D @ 0100 ON 10/08/23 BY JUW Performed at [facility], [address], [place], [place] [address]   Basic metabolic panel     Status: Abnormal   Collection Time: 10/08/23  3:56 AM  Result Value Ref Range   Sodium 137 135 - 145 mmol/L   Potassium 3.8 3.5 - 5.1 mmol/L   Chloride 104 98 - 111 mmol/L   CO2 24 22 - 32 mmol/L   Glucose, Bld 113 (H) 70 - 99 mg/dL    Comment: Glucose reference range applies only to samples taken after fasting for at least 8 hours.   BUN 18 8 - 23 mg/dL   Creatinine, Ser 0.98 (H) 0.44 - 1.00 mg/dL   Calcium 7.8 (L) 8.9 - 10.3 mg/dL   GFR, Estimated 33 (L) >60 mL/min    Comment: (NOTE) Calculated using the CKD-EPI Creatinine Equation (2021)    Anion gap 9 5 - 15    Comment: Performed at [facility], [address] [place], [place] [address]  CBC     Status: Abnormal   Collection Time: 10/08/23  3:56 AM  Result Value Ref Range   WBC 10.8 (H) 4.0 - 10.5 K/uL   RBC 3.07 (L) 3.87 - 5.11 MIL/uL   Hemoglobin 9.3 (L) 12.0 - 15.0 g/dL   HCT 78.2 (L) 95.6 - 21.3 %   MCV 99.3 80.0 - 100.0 fL    MCH 30.3 26.0 - 34.0 pg   MCHC 30.5 30.0 - 36.0 g/dL   RDW 08.6 57.8 - 46.9 %   Platelets 281 150 - 400 K/uL   nRBC 0.0 0.0 - 0.2 %    Comment: Performed at [facility], [address] [place], [place] [address]    CT ABDOMEN PELVIS WO CONTRAST Result Date: 10/07/2023 CLINICAL DATA:  Diverticulitis EXAM: CT ABDOMEN AND PELVIS WITHOUT CONTRAST TECHNIQUE: Multidetector CT imaging of the abdomen and pelvis was performed following the standard protocol without IV contrast. RADIATION DOSE REDUCTION: This exam was performed according to the departmental dose-optimization program which includes automated exposure control, adjustment of the mA and/or kV according to patient size and/or use of iterative reconstruction technique. COMPARISON:  CT of the abdomen and pelvis performed September 26, 2023 FINDINGS: Lower chest: Trace pleural effusions. Hepatobiliary: No focal liver lesion. The gallbladder surgically absent. Pancreas: Unremarkable. No pancreatic ductal dilatation or surrounding inflammatory changes. Spleen: Normal in size without focal abnormality. Adrenals/Urinary Tract: Persistent nephrograms. Right lower pole renal cyst. No hydronephrosis.  Residual contrast agent is present within the urinary bladder. Stomach/Bowel: Small hiatal hernia. Stomach is nondilated. The proximal small bowel is decompressed. Within the mid small bowel, there is mild diffuse gaseous distension. There is a transition from gaseous distension to decompressed in the central pelvis which is annotated on image 70 of series 2. Beyond this level, the small-bowel D comes decompressed and small in caliber. The terminal ileum is nondilated. Liquid and contrast containing stool material is than present within the right and transverse colon. This is moderate to large in volume. Contrast containing stool material is present in the distal colon. The rectum is largely decompressed. There is a small central air and fluid containing collection  which is best seen on image 66 of series 2 which is estimated at 3.9 x 3.6 cm. This is adjacent to the prior catheter site (and previously contained air). Vascular/Lymphatic: Not significantly changed. Reproductive: Unchanged Other: Diffuse anasarca. Musculoskeletal: Degenerative changes in the imaged osseous structures, stable. IMPRESSION: 1. Mild diffuse gaseous distension of central loops of small bowel with a transition point in the pelvis, just proximal to the terminal ileum suggestive of partial obstruction. 2. There is a small air and fluid containing collection in the central pelvis, adjacent to the previous drainage catheter, which measures 3.9 x 3.6 cm. This is not readily amenable to percutaneous access secondary to the central location and absence of access window. 3. A large volume of liquid stool material is present in the right and transverse colon. 4. Persistent nephrograms which can be observed in the setting of renal dysfunction. Electronically Signed   By: Reagan Camera M.D.   On: 10/07/2023 13:07    ROS:  Pertinent items are noted in HPI.  Blood pressure (!) 101/57, pulse 63, temperature (!) 97.5 F (36.4 C), temperature source Oral, resp. rate 18, height 5\' 4"  (1.626 m), weight 85.3 kg, SpO2 97%. Physical Exam: Pleasant white female in no acute distress.  She appears fatigued. Head is normocephalic, atraumatic Lungs clear to auscultation with equal breath sounds bilaterally Heart examination reveals a regular rate and rhythm Abdomen is soft with no particular tenderness noted.  No distention is noted.  Minimal bowel sounds appreciated.  CT scan images personally reviewed Assessment/Plan: Impression: Known sigmoid diverticulitis with colocutaneous fistula.  Patient failed outpatient conservative therapy.  Will plan on doing a partial colectomy with possible colostomy, small bowel resection on 10/10/2023.  The risks and benefits of the procedure including bleeding, infection,  cardiopulmonary difficulties, and the strong possibility of a colostomy were fully explained to the patient and family, who gave informed consent.  Will order enema today in order to facilitate cleansing of her bowel.  May continue heparin  drip until tomorrow evening.  Patient will be in ICU postoperatively to monitor her cardiopulmonary status.  Discussed with Dr. Michaelene Admire.  Felicia Frank 10/08/2023, 7:53 AM

## 2023-10-08 NOTE — Progress Notes (Signed)
 PHARMACY - ANTICOAGULATION CONSULT NOTE  Pharmacy Consult for heparin  Indication: atrial fibrillation  Allergies  Allergen Reactions   Amlodipine Swelling   Clonidine Rash    Rash with patch only.  Okay to take pill    Patient Measurements: Height: 5\' 4"  (162.6 cm) Weight: 85.3 kg (188 lb 0.8 oz) IBW/kg (Calculated) : 54.7 HEPARIN  DW (KG): 73.5  Vital Signs: Temp: 97.7 F (36.5 C) (05/10 2013) Temp Source: Oral (05/10 2013) BP: 110/44 (05/10 2148) Pulse Rate: 65 (05/10 2148)  Labs: Recent Labs    10/07/23 0919 10/07/23 2244 10/08/23 0356 10/08/23 0940 10/08/23 2222  HGB 10.6*  --  9.3*  --   --   HCT 33.5*  --  30.5*  --   --   PLT 304  --  281  --   --   APTT  --  >200*  --  >200* 53*  HEPARINUNFRC  --   --   --  >1.10*  --   CREATININE 1.91*  --  1.60*  --   --     Estimated Creatinine Clearance: 30.6 mL/min (A) (by C-G formula based on SCr of 1.6 mg/dL (H)).   Medical History: Past Medical History:  Diagnosis Date   Diverticulosis    HTN (hypertension)    Hypothyroidism    Assessment: Pharmacy consulted to dose heparin  in patient with atrial fibrillation. Patient is on Eliquis  prior to admission with last dose 5/8 @ 2030.  aPTT now below goal after being held for 1.5 hours and infusion restarting at 500 units/hr. No bleeding issues per RN. Heparin  was paused ~10 minutes prior to level being drawn given IV was leaking.  Goal of Therapy:  Heparin  level 0.3-0.7 units/ml aPTT 66-102 seconds Monitor platelets by anticoagulation protocol: Yes   Plan:  Increase heparin  gtt slightly to 600 units/hr 8h aPTT Monitor with aPTT until correlates with heparin  levels CBC daily  Young Hensen, PharmD, BCPS Clinical Pharmacist 10/08/2023 11:13 PM

## 2023-10-08 NOTE — Progress Notes (Signed)
 PHARMACY - ANTICOAGULATION CONSULT NOTE  Pharmacy Consult for heparin  Indication: atrial fibrillation  Labs: Recent Labs    10/07/23 0919  HGB 10.6*  HCT 33.5*  PLT 304  CREATININE 1.91*   Assessment: 78yo female supratherapeutic on heparin  with initial dosing while DOAC on hold (PTT >200 per verbal confirmation with lab, drawn from opposite arm as heparin  infusion per RN); no infusion issues or signs of bleeding per RN.  Goal of Therapy:  aPTT 66-102 seconds   Plan:  Hold heparin  infusion x1h then decrease rate by 4 units/kgABW/hr to 800 units/hr. Check PTT in 8 hours.   Lonnie Roberts, PharmD, BCPS 10/08/2023 12:54 AM

## 2023-10-08 NOTE — Progress Notes (Signed)
 PHARMACY - ANTICOAGULATION CONSULT NOTE  Pharmacy Consult for heparin  Indication: atrial fibrillation  Allergies  Allergen Reactions   Amlodipine Swelling   Clonidine Rash    Rash with patch only.  Okay to take pill    Patient Measurements: Height: 5\' 4"  (162.6 cm) Weight: 85.3 kg (188 lb 0.8 oz) IBW/kg (Calculated) : 54.7 HEPARIN  DW (KG): 73.5  Vital Signs: Temp: 97.5 F (36.4 C) (05/10 0402) Temp Source: Oral (05/10 0402) BP: 101/57 (05/10 0402) Pulse Rate: 63 (05/10 0402)  Labs: Recent Labs    10/07/23 0919 10/07/23 2244 10/08/23 0356  HGB 10.6*  --  9.3*  HCT 33.5*  --  30.5*  PLT 304  --  281  APTT  --  >200*  --   CREATININE 1.91*  --  1.60*    Estimated Creatinine Clearance: 30.6 mL/min (A) (by C-G formula based on SCr of 1.6 mg/dL (H)).   Medical History: Past Medical History:  Diagnosis Date   Diverticulosis    HTN (hypertension)    Hypothyroidism    Assessment: Pharmacy consulted to dose heparin  in patient with atrial fibrillation. Patient is on Eliquis  prior to admission with last dose 5/8 @ 2030.  Heparin  level and aptt above detectable levels, will hold for 1.5 hours and restart infusion at 500 units/hr. Recheck aptt tonight. No bleeding issues noted.   Goal of Therapy:  Heparin  level 0.3-0.7 units/ml aPTT 66-102 seconds Monitor platelets by anticoagulation protocol: Yes   Plan:  Hold heparin  until 2p then restart at 500 units/hr Recheck aptt tonight  Audra Blend PharmD., BCPS Clinical Pharmacist 10/08/2023 2:14 PM

## 2023-10-08 NOTE — Progress Notes (Signed)
 Progress Note   Patient: Felicia Frank RUE:[id number] DOB: [birth year] DOA: 10/07/2023     1 DOS: the patient was seen and examined on 10/08/2023   Brief hospital admission narrative course: Felicia Frank is a 78 y.o. female with medical history significant of hypothyroidism, hypertension, chronic kidney disease stage IIIa, class I obesity, atrial fibrillation and recent hospitalization secondary to diverticulitis with perforation and contained abscess status post drain placement and enteral fistula formation; who presented to the hospital secondary to still ongoing abdominal pain, associated nausea/intermittent vomiting and difficulty keeping things down.   Workup in the ED demonstrating elevated WBCs and abnormal CT scan with concern for enteral fistula formation, contained abscess and diverticulitis.  Unfortunately given location of this new contained abscess no ability or safe margin for placement percutaneous drain.   Case was discussed with general surgery who recommended admission for IV antibiotics and anticipated surgical intervention on 10/10/2023.  Assessment and plan 1-diverticulitis of the colon with perforation and enterocolonic fistula -Continue IV antibiotics - As needed analgesics and antiemetics - Following general surgery recommendations plan will be for bowel prep as tolerated and intention to proceed with surgical repair on 10/10/2023.  2-chronic paroxysmal atrial fibrillation - Rate controlled - Will continue treatment with metoprolol  and amiodarone  - Heparin  drip while allowing Eliquis  washout.  3-essential hypertension - Continue current antihypertensive agents-follow vital signs.  4-hypothyroidism - Continue Synthroid .  5-acute kidney injury in the setting of chronic kidney disease stage IIIa - Creatinine improving with fluid resuscitation and currently down to 1.6 from 1.9 at time of admission - Will continue to maintain adequate hydration and follow renal  function trend - Minimize nephrotoxic agents.  6-hypokalemia - Continue to replete electrolyte with alcohol, potassium around 4 as much as possible - Potassium improved from 2.9-3.8 currently.  7-GERD - Continue PPI  8-compression fracture of L1 - Continue as needed analgesia - Will recommend continued use of TLSO brace at discharge.  9-class I obesity -Body mass index is 32.28 kg/m. - Low-calorie diet and portion control discussed with patient.  Physical Exam: Vitals:   10/08/23 0402 10/08/23 0922 10/08/23 1228 10/08/23 1623  BP: (!) 101/57 (!) 122/47 (!) 118/57 (!) 126/57  Pulse: 63 64  66  Resp: 18 18  20   Temp: (!) 97.5 F (36.4 C) (!) 97.5 F (36.4 C)  98.9 F (37.2 C)  TempSrc: Oral Oral  Oral  SpO2: 97% 97%  98%  Weight:      Height:       General exam: Alert, awake, oriented x 3; in no major distress.  Reported intermittent nausea and some abdominal discomfort but able to tolerate some clear liquids. Respiratory system: Good aeration on room air. Cardiovascular system: Denies palpitations; rate control, no rubs, no gallops, no JVD. Gastrointestinal system: Abdomen is nondistended, soft and without guarding.  Positive bowel sounds appreciated Central nervous system: Moving 4 limbs spontaneously.  No focal neurological deficits. Extremities: No diagnosis or clubbing. Skin: No petechiae. Psychiatry: Judgement and insight appear normal.  Flat affect appreciated on exam.  Data Reviewed: CBC: WBCs 10.8, hemoglobin 9.3 and platelet count 281K Basic metabolic panel: Sodium 137, potassium 3.8, chloride 104, bicarb 24, BUN 18, creatinine 1.6 and GFR 33  Family Communication: Daughter at bedside.  Disposition: Status is: Inpatient Remains inpatient appropriate because: Continue IV therapy.  Will follow patient's response after surgical intervention to determine the need of any rehabilitation prior to returning home.  Time spent: 50 minutes  Author: Justina Oman, MD 10/08/2023  6:06 PM  For on call review [email or web address].

## 2023-10-08 NOTE — Plan of Care (Signed)
 Abdominal pain controlled with iv dilaudid . Vomited once during the shift-small amount.  Problem: Activity: Goal: Risk for activity intolerance will decrease Outcome: Not Progressing   Problem: Nutrition: Goal: Adequate nutrition will be maintained Outcome: Not Progressing

## 2023-10-09 ENCOUNTER — Inpatient Hospital Stay (HOSPITAL_COMMUNITY)

## 2023-10-09 ENCOUNTER — Other Ambulatory Visit: Payer: Self-pay

## 2023-10-09 DIAGNOSIS — I480 Paroxysmal atrial fibrillation: Secondary | ICD-10-CM | POA: Diagnosis not present

## 2023-10-09 DIAGNOSIS — N179 Acute kidney failure, unspecified: Secondary | ICD-10-CM | POA: Diagnosis not present

## 2023-10-09 DIAGNOSIS — K5720 Diverticulitis of large intestine with perforation and abscess without bleeding: Secondary | ICD-10-CM | POA: Diagnosis not present

## 2023-10-09 DIAGNOSIS — E876 Hypokalemia: Secondary | ICD-10-CM | POA: Diagnosis not present

## 2023-10-09 LAB — CBC
HCT: 27.6 % — ABNORMAL LOW (ref 36.0–46.0)
Hemoglobin: 8.7 g/dL — ABNORMAL LOW (ref 12.0–15.0)
MCH: 31.2 pg (ref 26.0–34.0)
MCHC: 31.5 g/dL (ref 30.0–36.0)
MCV: 98.9 fL (ref 80.0–100.0)
Platelets: 224 10*3/uL (ref 150–400)
RBC: 2.79 MIL/uL — ABNORMAL LOW (ref 3.87–5.11)
RDW: 15.2 % (ref 11.5–15.5)
WBC: 10.8 10*3/uL — ABNORMAL HIGH (ref 4.0–10.5)
nRBC: 0 % (ref 0.0–0.2)

## 2023-10-09 LAB — ABO/RH: ABO/RH(D): A POS

## 2023-10-09 LAB — BASIC METABOLIC PANEL WITH GFR
Anion gap: 7 (ref 5–15)
BUN: 25 mg/dL — ABNORMAL HIGH (ref 8–23)
CO2: 22 mmol/L (ref 22–32)
Calcium: 7.7 mg/dL — ABNORMAL LOW (ref 8.9–10.3)
Chloride: 106 mmol/L (ref 98–111)
Creatinine, Ser: 2 mg/dL — ABNORMAL HIGH (ref 0.44–1.00)
GFR, Estimated: 25 mL/min — ABNORMAL LOW (ref >60)
Glucose, Bld: 88 mg/dL (ref 70–99)
Potassium: 3.6 mmol/L (ref 3.5–5.1)
Sodium: 135 mmol/L (ref 135–145)

## 2023-10-09 LAB — PHOSPHORUS: Phosphorus: 2.8 mg/dL (ref 2.5–4.6)

## 2023-10-09 LAB — MAGNESIUM: Magnesium: 1.9 mg/dL (ref 1.7–2.4)

## 2023-10-09 LAB — HEPARIN LEVEL (UNFRACTIONATED): Heparin Unfractionated: >1.1 [IU]/mL — ABNORMAL HIGH (ref 0.30–0.70)

## 2023-10-09 LAB — APTT
aPTT: 73 s — ABNORMAL HIGH (ref 24–36)
aPTT: 78 s — ABNORMAL HIGH (ref 24–36)

## 2023-10-09 MED ORDER — AMIODARONE HCL IN DEXTROSE 360-4.14 MG/200ML-% IV SOLN
30.0000 mg/h | INTRAVENOUS | Status: AC
  Administered 2023-10-09 – 2023-10-23 (×28): 30 mg/h via INTRAVENOUS
  Filled 2023-10-09 (×28): qty 200

## 2023-10-09 MED ORDER — METOPROLOL TARTRATE 5 MG/5ML IV SOLN
5.0000 mg | Freq: Once | INTRAVENOUS | Status: AC
  Administered 2023-10-09: 5 mg via INTRAVENOUS
  Filled 2023-10-09: qty 5

## 2023-10-09 MED ORDER — METOPROLOL TARTRATE 5 MG/5ML IV SOLN
2.5000 mg | Freq: Three times a day (TID) | INTRAVENOUS | Status: DC
  Filled 2023-10-09: qty 5

## 2023-10-09 MED ORDER — METOPROLOL TARTRATE 5 MG/5ML IV SOLN: 5.0000 mg | Freq: Once | INTRAVENOUS | Status: DC

## 2023-10-09 MED ORDER — SODIUM CHLORIDE 0.9 % IV BOLUS
500.0000 mL | Freq: Once | INTRAVENOUS | Status: AC
  Administered 2023-10-09: 500 mL via INTRAVENOUS

## 2023-10-09 MED ORDER — METOPROLOL TARTRATE 5 MG/5ML IV SOLN
2.5000 mg | Freq: Three times a day (TID) | INTRAVENOUS | Status: DC
  Administered 2023-10-09 – 2023-10-10 (×3): 2.5 mg via INTRAVENOUS
  Filled 2023-10-09 (×3): qty 5

## 2023-10-09 MED ORDER — AMIODARONE LOAD VIA INFUSION
150.0000 mg | Freq: Once | INTRAVENOUS | Status: AC
  Administered 2023-10-09: 150 mg via INTRAVENOUS
  Filled 2023-10-09: qty 83.34

## 2023-10-09 MED ORDER — AMIODARONE HCL IN DEXTROSE 360-4.14 MG/200ML-% IV SOLN
60.0000 mg/h | INTRAVENOUS | Status: AC
  Administered 2023-10-09 (×2): 60 mg/h via INTRAVENOUS
  Filled 2023-10-09 (×2): qty 200

## 2023-10-09 MED ORDER — CHLORHEXIDINE GLUCONATE CLOTH 2 % EX PADS
6.0000 | MEDICATED_PAD | Freq: Every day | CUTANEOUS | Status: DC
  Administered 2023-10-09 – 2023-10-13 (×4): 6 via TOPICAL

## 2023-10-09 MED ORDER — SODIUM CHLORIDE 0.9% FLUSH
10.0000 mL | Freq: Two times a day (BID) | INTRAVENOUS | Status: DC
  Administered 2023-10-09 – 2023-10-14 (×10): 10 mL
  Administered 2023-10-15: 20 mL
  Administered 2023-10-15 – 2023-10-21 (×13): 10 mL
  Administered 2023-10-21 – 2023-10-22 (×2): 40 mL
  Administered 2023-10-22: 10 mL
  Administered 2023-10-23: 20 mL
  Administered 2023-10-23 – 2023-10-25 (×4): 10 mL
  Administered 2023-10-25: 20 mL
  Administered 2023-10-26 – 2023-10-27 (×3): 10 mL

## 2023-10-09 MED ORDER — SODIUM CHLORIDE 0.9% FLUSH: 10.0000 mL | INTRAVENOUS | Status: DC | PRN

## 2023-10-09 NOTE — Progress Notes (Signed)
 Progress Note   Patient: Felicia Frank:[id number] DOB: 1946/04/19 DOA: 10/07/2023     2 DOS: the patient was seen and examined on 10/09/2023   Brief hospital admission narrative course: Felicia Frank is a 78 y.o. Felicia with medical history significant of hypothyroidism, hypertension, chronic kidney disease stage IIIa, class I obesity, atrial fibrillation and recent hospitalization secondary to diverticulitis with perforation and contained abscess status post drain placement and enteral fistula formation; who presented to the hospital secondary to still ongoing abdominal pain, associated nausea/intermittent vomiting and difficulty keeping things down.   Workup in the ED demonstrating elevated WBCs and abnormal CT scan with concern for enteral fistula formation, contained abscess and diverticulitis.  Unfortunately given location of this new contained abscess no ability or safe margin for placement percutaneous drain.   Case was discussed with general surgery who recommended admission for IV antibiotics and anticipated surgical intervention on 10/10/2023.  Assessment and plan 1-diverticulitis of the colon with perforation and enterocolonic fistula -Continue IV antibiotics - Continue as needed analgesics and antiemetics - Following general surgery recommendations plan will be for bowel prep as tolerated and intention to proceed with surgical repair on 10/11/2023. - No fever unstable WBCs appreciated  2-chronic paroxysmal atrial fibrillation with RVR - Will continue the use of adjusted dose IV metoprolol  and SR amiodarone  drip with loading dose - Continue heparin  - Holding Eliquis  with anticipated surgical intervention on 10/11/2023   3-essential hypertension - Blood pressures currently soft - Continue adjusted antihypertensive agents and follow vital signs.  4-hypothyroidism - Continue Synthroid . - If unable to take p.o.'s we will transition to IV route (half of current Synthroid  dose  when given IV).  5-acute kidney injury in the setting of chronic kidney disease stage IIIa - Creatinine improving with fluid resuscitation and currently down to 1.6 from 1.9 at time of admission - Will continue to maintain adequate hydration and follow renal function trend - Minimize nephrotoxic agents.  6-hypokalemia - Continue to replete electrolyte with alcohol, potassium around 4 as much as possible - Potassium improved from 2.9-3.8 currently.  7-GERD - Continue PPI  8-compression fracture of L1 - Continue as needed analgesia - Will recommend continued use of TLSO brace at discharge.  9-class I obesity -Body mass index is 32.28 kg/m. - Low-calorie diet and portion control discussed with patient.  Physical Exam:  Subjective: Afebrile, no active vomiting.  Complaining of intermittent nausea and abdominal pain.  Overnight with soft blood pressure and presence of A-fib with RVR.  Vitals:   10/09/23 0921 10/09/23 0922 10/09/23 0930 10/09/23 1030  BP: (!) 112/56 (!) 90/48 (!) 96/57 109/71  Pulse: (!) 125 (!) 126 (!) 124 (!) 121  Resp: (!) 24 (!) 25 15 (!) 21  Temp:      TempSrc:      SpO2: 98% 97% 98% 98%  Weight:      Height:       General exam: Reports intermittent nausea, abdominal pain and overnight.  Soft blood pressure along with A-fib with RVR requiring patient to be transferred to stepdown unit. Respiratory system: No requiring oxygen supplementation. Cardiovascular system: Patient denies chest pain; irregularly irregular, no JVD, no gallops. Gastrointestinal system: Abdomen is soft, with positive bowel sounds; reporting intermittent left lower quadrant discomfort. Central nervous system: In 4 limbs spontaneously.  No focal neurological deficits. Extremities: No cyanosis or clubbing. Skin: No petechiae. Psychiatry: Judgement and insight appear normal.  Flat affect appreciated on exam. Latest data Reviewed: CBC: WBCs 10.8, hemoglobin 8.7 and  platelet count  224K Basic metabolic panel:Sodium 135, potassium 3.6, chloride 106, bicarb 22, BUN 25, creatinine 2.0 and GFR 25 Magnesium : 1.9  Family Communication: Daughter at bedside.  Disposition: Status is: Inpatient Remains inpatient appropriate because: Continue IV antibiotics, metoprolol  and amiodarone  drip; follow electrolytes and replete as needed maintain adequate hydration.  Will continue to follow general surgery recommendations and will place PICC line to facilitate blood draws and infusions.  Planning for surgical intervention on 10/11/2023.  Will follow patient's response after surgical intervention to determine the need of any rehabilitation prior to returning home.  CRITICAL CARE Performed by: Justina Oman   Total critical care time: 55 minutes  Critical care time was exclusive of separately billable procedures and treating other patients.  Critical care was necessary to treat or prevent imminent or life-threatening deterioration.  Critical care was time spent personally by me on the following activities: development of treatment plan with patient and/or surrogate as well as nursing, discussions with consultants, evaluation of patient's response to treatment, examination of patient, obtaining history from patient or surrogate, ordering and performing treatments and interventions, ordering and review of laboratory studies, ordering and review of radiographic studies, pulse oximetry and re-evaluation of patient's condition.   For on call review [email or web address].

## 2023-10-09 NOTE — Plan of Care (Signed)
  Problem: Education: Goal: Knowledge of General Education information will improve Description: Including pain rating scale, medication(s)/side effects and non-pharmacologic comfort measures Outcome: Progressing   Problem: Health Behavior/Discharge Planning: Goal: Ability to manage health-related needs will improve Outcome: Progressing   Problem: Clinical Measurements: Goal: Ability to maintain clinical measurements within normal limits will improve Outcome: Progressing Goal: Will remain free from infection Outcome: Progressing Goal: Diagnostic test results will improve Outcome: Progressing Goal: Respiratory complications will improve Outcome: Progressing Goal: Cardiovascular complication will be avoided Outcome: Not Progressing   Problem: Activity: Goal: Risk for activity intolerance will decrease Outcome: Not Progressing   Problem: Nutrition: Goal: Adequate nutrition will be maintained Outcome: Not Progressing   Problem: Coping: Goal: Level of anxiety will decrease Outcome: Progressing   Problem: Elimination: Goal: Will not experience complications related to bowel motility Outcome: Progressing Goal: Will not experience complications related to urinary retention Outcome: Not Progressing   Problem: Pain Managment: Goal: General experience of comfort will improve and/or be controlled Outcome: Progressing   Problem: Safety: Goal: Ability to remain free from injury will improve Outcome: Progressing   Problem: Skin Integrity: Goal: Risk for impaired skin integrity will decrease Outcome: Progressing

## 2023-10-09 NOTE — Progress Notes (Signed)
 Patient developed atrial fibrillation with RVR 120 to 140, hypotension with blood pressure 75/35 mmHg Ordered IV fluid boluses 500 cc x2 with improvement in blood pressure at 101/79 with persistent tachycardia 120 to 140 bpm, atrial fibrillation, ordered metoprolol  5 mg IV.  Follow up HR still 120 to 140, and blood pressure 82/64  Her abdominal pain has been stable.   Plan to give another 500 cc IV bolus and check KUB. Transfer to stepdown unit, may amiodarone  infusion.

## 2023-10-09 NOTE — Progress Notes (Signed)
 Subjective: Events of last night noted.  Patient currently still with rapid atrial fibrillation.  She has had multiple bowel movements since the enema.  She denies any significant abdominal pain.  Objective: Vital signs in last 24 hours: Temp:  [97.5 F (36.4 C)-98.9 F (37.2 C)] 98.1 F (36.7 C) (05/11 0743) Pulse Rate:  [64-142] 140 (05/11 0800) Resp:  [14-21] 17 (05/11 0800) BP: (75-126)/(35-79) 90/60 (05/11 0800) SpO2:  [97 %-98 %] 98 % (05/11 0800) Last BM Date : 10/05/23  Intake/Output from previous day: 05/10 0701 - 05/11 0700 In: 1543.4 [P.O.:480; I.V.:923.9; IV Piggyback:139.4] Out: 200 [Urine:200] Intake/Output this shift: No intake/output data recorded.  General appearance: alert, cooperative, and no distress GI: soft, non-tender; bowel sounds normal; no masses,  no organomegaly  Lab Results:  Recent Labs    10/08/23 0356 10/09/23 0440  WBC 10.8* 10.8*  HGB 9.3* 8.7*  HCT 30.5* 27.6*  PLT 281 224   BMET Recent Labs    10/08/23 0356 10/09/23 0440  NA 137 135  K 3.8 3.6  CL 104 106  CO2 24 22  GLUCOSE 113* 88  BUN 18 25*  CREATININE 1.60* 2.00*  CALCIUM 7.8* 7.7*   PT/INR No results for input(s): "LABPROT", "INR" in the last 72 hours.  Studies/Results: US  EKG SITE RITE Result Date: 10/09/2023 If Site Rite image not attached, placement could not be confirmed due to current cardiac rhythm.  CT ABDOMEN PELVIS WO CONTRAST Result Date: 10/07/2023 CLINICAL DATA:  Diverticulitis EXAM: CT ABDOMEN AND PELVIS WITHOUT CONTRAST TECHNIQUE: Multidetector CT imaging of the abdomen and pelvis was performed following the standard protocol without IV contrast. RADIATION DOSE REDUCTION: This exam was performed according to the departmental dose-optimization program which includes automated exposure control, adjustment of the mA and/or kV according to patient size and/or use of iterative reconstruction technique. COMPARISON:  CT of the abdomen and pelvis performed  September 26, 2023 FINDINGS: Lower chest: Trace pleural effusions. Hepatobiliary: No focal liver lesion. The gallbladder surgically absent. Pancreas: Unremarkable. No pancreatic ductal dilatation or surrounding inflammatory changes. Spleen: Normal in size without focal abnormality. Adrenals/Urinary Tract: Persistent nephrograms. Right lower pole renal cyst. No hydronephrosis. Residual contrast agent is present within the urinary bladder. Stomach/Bowel: Small hiatal hernia. Stomach is nondilated. The proximal small bowel is decompressed. Within the mid small bowel, there is mild diffuse gaseous distension. There is a transition from gaseous distension to decompressed in the central pelvis which is annotated on image 70 of series 2. Beyond this level, the small-bowel D comes decompressed and small in caliber. The terminal ileum is nondilated. Liquid and contrast containing stool material is than present within the right and transverse colon. This is moderate to large in volume. Contrast containing stool material is present in the distal colon. The rectum is largely decompressed. There is a small central air and fluid containing collection which is best seen on image 66 of series 2 which is estimated at 3.9 x 3.6 cm. This is adjacent to the prior catheter site (and previously contained air). Vascular/Lymphatic: Not significantly changed. Reproductive: Unchanged Other: Diffuse anasarca. Musculoskeletal: Degenerative changes in the imaged osseous structures, stable. IMPRESSION: 1. Mild diffuse gaseous distension of central loops of small bowel with a transition point in the pelvis, just proximal to the terminal ileum suggestive of partial obstruction. 2. There is a small air and fluid containing collection in the central pelvis, adjacent to the previous drainage catheter, which measures 3.9 x 3.6 cm. This is not readily amenable to percutaneous  access secondary to the central location and absence of access window. 3. A large  volume of liquid stool material is present in the right and transverse colon. 4. Persistent nephrograms which can be observed in the setting of renal dysfunction. Electronically Signed   By: Reagan Camera M.D.   On: 10/07/2023 13:07    Anti-infectives: Anti-infectives (From admission, onward)    Start     Dose/Rate Route Frequency Ordered Stop   10/07/23 1500  piperacillin -tazobactam (ZOSYN ) IVPB 3.375 g        3.375 g 12.5 mL/hr over 240 Minutes Intravenous Every 8 hours 10/07/23 1356         Assessment/Plan: Impression: Recurrent rapid atrial fibrillation.  She is on an amiodarone  drip but is still not under control.  Her bowel function has returned and she is having multiple bowel movements. Plan: Will delay surgical intervention tomorrow due to the rapid atrial fibrillation.  Discussed with Dr. Michaelene Admire.  Patient aware.  LOS: 2 days    [name] 10/09/2023

## 2023-10-09 NOTE — Progress Notes (Signed)
 Peripherally Inserted Central Catheter Placement  The IV Nurse has discussed with the patient and/or persons authorized to consent for the patient, the purpose of this procedure and the potential benefits and risks involved with this procedure.  The benefits include less needle sticks, lab draws from the catheter, and the patient may be discharged home with the catheter. Risks include, but not limited to, infection, bleeding, blood clot (thrombus formation), and puncture of an artery; nerve damage and irregular heartbeat and possibility to perform a PICC exchange if needed/ordered by physician.  Alternatives to this procedure were also discussed.  Bard Power PICC patient education guide, fact sheet on infection prevention and patient information card has been provided to patient /or left at bedside.    PICC Placement Documentation  PICC Double Lumen 10/09/23 Right Brachial 37 cm 0 cm (Active)  Indication for Insertion or Continuance of Line Vasoactive infusions;Poor Vasculature-patient has had multiple peripheral attempts or PIVs lasting less than 24 hours 10/09/23 1618  Exposed Catheter (cm) 0 cm 10/09/23 1618  Site Assessment Clean, Dry, Intact 10/09/23 1618  Lumen #1 Status Flushed;Saline locked;Blood return noted 10/09/23 1618  Lumen #2 Status Flushed;Saline locked;Blood return noted 10/09/23 1618  Dressing Type Transparent;Securing device 10/09/23 1618  Dressing Status Antimicrobial disc/dressing in place;Clean, Dry, Intact 10/09/23 1618  Line Care Connections checked and tightened 10/09/23 1618  Line Adjustment (NICU/IV Team Only) No 10/09/23 1618  Dressing Intervention New dressing;Adhesive placed at insertion site (IV team only);Adhesive placed around edges of dressing (IV team/ICU RN only) 10/09/23 1618  Dressing Change Due 10/16/23 10/09/23 1618       Felicia Frank 10/09/2023, 4:18 PM

## 2023-10-09 NOTE — Progress Notes (Signed)
 CXR confirmation from radiology received and confirmed with Dr Michaelene Admire.  Routine orders placed and RN notified.

## 2023-10-09 NOTE — Progress Notes (Signed)
 Patient ID: Felicia Frank, female   DOB: Aug 23, 1945, 78 y.o.   MRN: [id number] I was contacted by Dr. Michaelene Admire to discuss this patient's case and the need for renal clearance before placement of PICC line. Based on her advanced age, current renal function (CKD 3A with baseline GFR around 46 mL/minute) and current comorbidities including difficulties with intravenous access patency, I approve placement of the PICC line for intravenous fluid therapy/labs on her DOMINANT arm.  Clevester Dally MD [facility]. Office # [phone] Pager # [phone] 10:33 AM

## 2023-10-09 NOTE — Progress Notes (Addendum)
 PHARMACY - ANTICOAGULATION CONSULT NOTE  Pharmacy Consult for heparin  Indication: atrial fibrillation  Allergies  Allergen Reactions   Amlodipine Swelling   Clonidine Rash    Rash with patch only.  Okay to take pill    Patient Measurements: Height: 5\' 4"  (162.6 cm) Weight: 85.3 kg (188 lb 0.8 oz) IBW/kg (Calculated) : 54.7 HEPARIN  DW (KG): 73.5  Vital Signs: Temp: 98.1 F (36.7 C) (05/11 0743) Temp Source: Oral (05/11 0743) BP: 90/60 (05/11 0800) Pulse Rate: 140 (05/11 0800)  Labs: Recent Labs    10/07/23 0919 10/07/23 2244 10/08/23 0356 10/08/23 0940 10/08/23 2222 10/09/23 0440 10/09/23 0826  HGB 10.6*  --  9.3*  --   --  8.7*  --   HCT 33.5*  --  30.5*  --   --  27.6*  --   PLT 304  --  281  --   --  224  --   APTT  --    < >  --  >200* 53*  --  73*  HEPARINUNFRC  --   --   --  >1.10*  --   --  >1.10*  CREATININE 1.91*  --  1.60*  --   --  2.00*  --    < > = values in this interval not displayed.    Estimated Creatinine Clearance: 24.5 mL/min (A) (by C-G formula based on SCr of 2 mg/dL (H)).   Medical History: Past Medical History:  Diagnosis Date   Diverticulosis    HTN (hypertension)    Hypothyroidism    Assessment: Pharmacy consulted to dose heparin  in patient with atrial fibrillation. Patient is on Eliquis  prior to admission with last dose 5/8 @ 2030.  Heparin  level still >1 due to recent apixaban  use. Aptt now at goal x2 on 600 units/hour. No bleeding issues noted however hemoglobin continues to trend down to 8.7 this morning. Patient also received fluid boluses overnight so could be seeing some dilution. Plan is for surgery in am.   Goal of Therapy:  Heparin  level 0.3-0.7 units/ml aPTT 66-102 seconds Monitor platelets by anticoagulation protocol: Yes   Plan:  Continue heparin  at 600 units/hr Recheck aptt/HL in am  Audra Blend PharmD., BCPS Clinical Pharmacist 10/09/2023 8:59 AM

## 2023-10-10 ENCOUNTER — Encounter (HOSPITAL_COMMUNITY)
Admission: EM | Disposition: A | Discharge status: Skilled Nursing Facility | Payer: Self-pay | Source: Home / Self Care | Attending: Internal Medicine

## 2023-10-10 DIAGNOSIS — K5720 Diverticulitis of large intestine with perforation and abscess without bleeding: Secondary | ICD-10-CM | POA: Diagnosis not present

## 2023-10-10 DIAGNOSIS — K632 Fistula of intestine: Secondary | ICD-10-CM | POA: Diagnosis not present

## 2023-10-10 DIAGNOSIS — N179 Acute kidney failure, unspecified: Secondary | ICD-10-CM | POA: Diagnosis not present

## 2023-10-10 DIAGNOSIS — K5792 Diverticulitis of intestine, part unspecified, without perforation or abscess without bleeding: Secondary | ICD-10-CM | POA: Diagnosis not present

## 2023-10-10 LAB — HEPARIN LEVEL (UNFRACTIONATED): Heparin Unfractionated: >1.1 [IU]/mL — ABNORMAL HIGH (ref 0.30–0.70)

## 2023-10-10 LAB — CBC
HCT: 24.5 % — ABNORMAL LOW (ref 36.0–46.0)
Hemoglobin: 7.9 g/dL — ABNORMAL LOW (ref 12.0–15.0)
MCH: 31.5 pg (ref 26.0–34.0)
MCHC: 32.2 g/dL (ref 30.0–36.0)
MCV: 97.6 fL (ref 80.0–100.0)
Platelets: 206 10*3/uL (ref 150–400)
RBC: 2.51 MIL/uL — ABNORMAL LOW (ref 3.87–5.11)
RDW: 15.2 % (ref 11.5–15.5)
WBC: 7.2 10*3/uL (ref 4.0–10.5)
nRBC: 0 % (ref 0.0–0.2)

## 2023-10-10 LAB — APTT
aPTT: 105 s — ABNORMAL HIGH (ref 24–36)
aPTT: 54 s — ABNORMAL HIGH (ref 24–36)

## 2023-10-10 LAB — BASIC METABOLIC PANEL WITH GFR
Anion gap: 10 (ref 5–15)
BUN: 20 mg/dL (ref 8–23)
CO2: 21 mmol/L — ABNORMAL LOW (ref 22–32)
Calcium: 7.7 mg/dL — ABNORMAL LOW (ref 8.9–10.3)
Chloride: 101 mmol/L (ref 98–111)
Creatinine, Ser: 1.63 mg/dL — ABNORMAL HIGH (ref 0.44–1.00)
GFR, Estimated: 32 mL/min — ABNORMAL LOW (ref >60)
Glucose, Bld: 156 mg/dL — ABNORMAL HIGH (ref 70–99)
Potassium: 2.9 mmol/L — ABNORMAL LOW (ref 3.5–5.1)
Sodium: 132 mmol/L — ABNORMAL LOW (ref 135–145)

## 2023-10-10 SURGERY — COLECTOMY, SIGMOID, OPEN
Anesthesia: General

## 2023-10-10 MED ORDER — DOXAZOSIN MESYLATE 2 MG PO TABS
4.0000 mg | ORAL_TABLET | Freq: Every day | ORAL | Status: DC
  Administered 2023-10-10 – 2023-10-12 (×3): 4 mg via ORAL
  Filled 2023-10-10 (×3): qty 2

## 2023-10-10 MED ORDER — POTASSIUM CHLORIDE CRYS ER 20 MEQ PO TBCR
40.0000 meq | EXTENDED_RELEASE_TABLET | ORAL | Status: AC
Start: 2023-10-10 — End: 2023-10-10
  Administered 2023-10-10 (×2): 40 meq via ORAL
  Filled 2023-10-10 (×2): qty 2

## 2023-10-10 MED ORDER — METOPROLOL TARTRATE 5 MG/5ML IV SOLN
2.5000 mg | Freq: Two times a day (BID) | INTRAVENOUS | Status: DC
  Administered 2023-10-10 – 2023-10-16 (×11): 2.5 mg via INTRAVENOUS
  Filled 2023-10-10 (×12): qty 5

## 2023-10-10 NOTE — Plan of Care (Signed)

## 2023-10-10 NOTE — Progress Notes (Signed)
 MD Michaelene Admire made aware of pts hypotension and that pt has sustained NSR throughout shift. Questioned continuing IV amio, per MD Michaelene Admire continue for now as pt will be NPO tomorrow, plans to reevaluate metoprolol  dosing due to hypotension. Pt resting comfortably at this time, will continue to monitor.

## 2023-10-10 NOTE — TOC Initial Note (Signed)
 Transition of Care [facility]) - Initial/Assessment Note    Patient Details  Name: Felicia Frank MRN: [id number] Date of Birth: 15-Jan-1946  [facility] CM/SW Contact:    Grandville Lax, LCSWA Phone Number: 10/10/2023, 11:30 AM  Clinical Narrative:                 Pt is high risk at readmission. CSW spoke with pt at bedside to complete assessment. Pt states she lives with her spouse. Pt is normally independent in completing her ADLs. Pt normally is able to drive when needed. Pt was recently admitted and set up with a hospital bed and 3N1. Pt also has a walker to use when ambulating. Pt was set up with [facility] PT/OT/RN services through [facility] at last admission, will need new orders if needed at this hospital D/C. TOC to follow.   Expected Discharge Plan: Home w Home Health Services Barriers to Discharge: Continued Medical Work up   Patient Goals and CMS Choice Patient states their goals for this hospitalization and ongoing recovery are:: get better CMS Medicare.gov Compare Post Acute Care list provided to:: Patient Choice offered to / list presented to : Patient      Expected Discharge Plan and Services In-house Referral: Clinical Social Work Discharge Planning Services: CM Consult Post Acute Care Choice: Home Health Living arrangements for the past 2 months: Single Family Home                                      Prior Living Arrangements/Services Living arrangements for the past 2 months: Single Family Home Lives with:: Spouse Patient language and need for interpreter reviewed:: Yes Do you feel safe going back to the place where you live?: Yes      Need for Family Participation in Patient Care: No (Comment) Care giver support system in place?: Yes (comment) Current home services: DME Criminal Activity/Legal Involvement Pertinent to Current Situation/Hospitalization: No - Comment as needed  Activities of Daily Living   ADL Screening (condition at time of  admission) Independently performs ADLs?: Yes (appropriate for developmental age) Is the patient deaf or have difficulty hearing?: No Does the patient have difficulty seeing, even when wearing glasses/contacts?: No Does the patient have difficulty concentrating, remembering, or making decisions?: No  Permission Sought/Granted                  Emotional Assessment Appearance:: Appears stated age Attitude/Demeanor/Rapport: Engaged Affect (typically observed): Accepting Orientation: : Oriented to Self, Oriented to Place, Oriented to  Time, Oriented to Situation Alcohol / Substance Use: Not Applicable Psych Involvement: No (comment)  Admission diagnosis:  Hypokalemia [E87.6] Diverticulitis of colon with perforation [K57.20] Acute kidney injury (HCC) [N17.9] Acute diverticulitis [K57.92] 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] Patient Active Problem List   Diagnosis Date Noted   Chronic anemia 09/26/2023   Obesity, 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   Atrial fibrillation, new onset (HCC) 09/18/2023   Compression fracture of L1 lumbar vertebra (HCC) 09/16/2023   Diverticulitis of colon with perforation 09/16/2023   Hyponatremia 09/16/2023   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   Normocytic anemia 10/07/2021   Constipation 10/07/2021   Dysphagia 10/07/2021   History of rectal bleeding 02/24/2021   Stage 3a chronic kidney disease (HCC) 06/09/2020   Gastroesophageal reflux disease 11/13/2019   Osteoarthritis 04/20/2019   Essential hypertension 04/20/2019   Hypothyroidism 04/20/2019   PCP:  Josephus Nida  S, MD Pharmacy:   [facility] [phone] [place], [place] - [address] [facility] [address] [place] [address] Phone: [phone] Fax: [phone]     Social Drivers of Health (SDOH) Social History: SDOH Screenings   Food Insecurity: No Food Insecurity (10/08/2023)  Housing: Low Risk  (10/08/2023)  Transportation Needs: No  Transportation Needs (10/08/2023)  Utilities: Not At Risk (10/08/2023)  Social Connections: Socially Integrated (10/08/2023)  Tobacco Use: Low Risk  (10/07/2023)   SDOH Interventions:     Readmission Risk Interventions    10/10/2023   11:26 AM 10/07/2023   10:06 PM 09/16/2023   10:04 PM  Readmission Risk Prevention Plan  Post Dischage Appt   Complete  Medication Screening   Complete  Transportation Screening Complete Complete Complete  PCP or Specialist Appt within 5-7 Days  Complete   Home Care Screening  Complete   Medication Review (RN CM)  Complete   HRI or Home Care Consult Complete    Social Work Consult for Recovery Care Planning/Counseling Complete    Palliative Care Screening Not Applicable    Medication Review Oceanographer) Complete

## 2023-10-10 NOTE — Plan of Care (Signed)

## 2023-10-10 NOTE — Progress Notes (Signed)
 PHARMACY - ANTICOAGULATION CONSULT NOTE  Pharmacy Consult for heparin  Indication: atrial fibrillation  Allergies  Allergen Reactions   Amlodipine Swelling   Clonidine Rash    Rash with patch only.  Okay to take pill    Patient Measurements: Height: 5\' 4"  (162.6 cm) Weight: 85.3 kg (188 lb 0.8 oz) IBW/kg (Calculated) : 54.7 HEPARIN  DW (KG): 73.5  Vital Signs: Temp: 97.7 F (36.5 C) (05/12 0730) Temp Source: Axillary (05/12 0730) BP: 100/52 (05/12 0730) Pulse Rate: 58 (05/12 0730)  Labs: Recent Labs    10/08/23 0356 10/08/23 0940 10/08/23 2222 10/09/23 0440 10/09/23 0826 10/09/23 1347 10/10/23 0701  HGB 9.3*  --   --  8.7*  --   --  7.9*  HCT 30.5*  --   --  27.6*  --   --  24.5*  PLT 281  --   --  224  --   --  206  APTT  --  >200*   < >  --  73* 78* 105*  HEPARINUNFRC  --  >1.10*  --   --  >1.10*  --  >1.10*  CREATININE 1.60*  --   --  2.00*  --   --  1.63*   < > = values in this interval not displayed.    Estimated Creatinine Clearance: 30 mL/min (A) (by C-G formula based on SCr of 1.63 mg/dL (H)).   Medical History: Past Medical History:  Diagnosis Date   Diverticulosis    HTN (hypertension)    Hypothyroidism    Assessment: Pharmacy consulted to dose heparin  in patient with atrial fibrillation. Patient is on Eliquis  prior to admission with last dose 5/8 @ 2030.  Heparin  level still >1 due to recent apixaban  use. Aptt at goal but trended up to upper end of range at 105s on 600  units/hour. No bleeding issues noted however hemoglobin continues to trend down to 7.9 this morning. Surgery delayed until 5/14 due to afib  Goal of Therapy:  Heparin  level 0.3-0.7 units/ml aPTT 66-102 seconds Monitor platelets by anticoagulation protocol: Yes   Plan:  Decrease heparin  to 500 units/hr Recheck aptt this afternoon to confirm still within range  Audra Blend PharmD., BCPS Clinical Pharmacist 10/10/2023 9:37 AM

## 2023-10-10 NOTE — Progress Notes (Signed)
  Subjective: Bowel movements have decreased in frequency.  Minimal abdominal discomfort noted.  No nausea noted.  Objective: Vital signs in last 24 hours: Temp:  [97.5 F (36.4 C)-97.9 F (36.6 C)] 97.7 F (36.5 C) (05/12 1223) Pulse Rate:  [58-141] 72 (05/12 1452) Resp:  [9-33] 18 (05/12 1452) BP: (84-146)/(37-96) 84/40 (05/12 1452) SpO2:  [52 %-100 %] 100 % (05/12 1452) Last BM Date : 10/10/23  Intake/Output from previous day: 05/11 0701 - 05/12 0700 In: 1789.5 [P.O.:240; I.V.:548.4; IV Piggyback:1001] Out: 400 [Urine:400] Intake/Output this shift: No intake/output data recorded.  General appearance: alert, cooperative, and no distress GI: Soft, nondistended.  Minimal discomfort to palpation noted.  Lab Results:  Recent Labs    10/09/23 0440 10/10/23 0701  WBC 10.8* 7.2  HGB 8.7* 7.9*  HCT 27.6* 24.5*  PLT 224 206   BMET Recent Labs    10/09/23 0440 10/10/23 0701  NA 135 132*  K 3.6 2.9*  CL 106 101  CO2 22 21*  GLUCOSE 88 156*  BUN 25* 20  CREATININE 2.00* 1.63*  CALCIUM 7.7* 7.7*   PT/INR No results for input(s): "LABPROT", "INR" in the last 72 hours.  Studies/Results: DG CHEST PORT 1 VIEW Result Date: 10/09/2023 CLINICAL DATA:  [id number] PICC (peripherally inserted central catheter) in place [id number] EXAM: PORTABLE CHEST 1 VIEW COMPARISON:  Chest x-ray 09/20/2023 FINDINGS: Right PICC with tip overlying the right atrium, likely in appropriate position along the superior cavoatrial junction given low lung volumes. The heart and mediastinal contours are unchanged. Low lung volumes. No focal consolidation. No pulmonary edema. No pleural effusion. No pneumothorax. No acute osseous abnormality. IMPRESSION: 1. Right PICC with tip overlying the right atrium, likely in appropriate position along the superior cavoatrial junction given low lung volumes. 2. Low lung volumes. Electronically Signed   By: Morgane  Naveau M.D.   On: 10/09/2023 18:08   DG Abd 1 View Result  Date: 10/09/2023 CLINICAL DATA:  Abdominal distension EXAM: ABDOMEN - 1 VIEW COMPARISON:  CT abdomen pelvis 10/07/2023 FINDINGS: Diffuse gaseous distension of the colon and small bowel. No dilated small bowel loops IMPRESSION: Diffuse gaseous distension of the colon and small bowel, likely ileus. Electronically Signed   By: Juanetta Nordmann M.D.   On: 10/09/2023 11:33   US  EKG SITE RITE Result Date: 10/09/2023 If Site Rite image not attached, placement could not be confirmed due to current cardiac rhythm.   Anti-infectives: Anti-infectives (From admission, onward)    Start     Dose/Rate Route Frequency Ordered Stop   10/07/23 1500  piperacillin -tazobactam (ZOSYN ) IVPB 3.375 g        3.375 g 12.5 mL/hr over 240 Minutes Intravenous Every 8 hours 10/07/23 1356         Assessment/Plan: Impression: Sigmoid diverticulitis with coloenteral fistula.  Patient with history of rapid atrial fibrillation which seems to be under control. Plan: Patient is scheduled for the partial colectomy with possible colostomy, possible small bowel resection on 10/12/2023.  Will see patient in morning to order bowel preparation.  Patient fully aware.  LOS: 3 days    [name] 10/10/2023

## 2023-10-10 NOTE — Progress Notes (Signed)
 PHARMACY - ANTICOAGULATION CONSULT NOTE  Pharmacy Consult for heparin  Indication: atrial fibrillation  Allergies  Allergen Reactions   Amlodipine Swelling   Clonidine Rash    Rash with patch only.  Okay to take pill    Patient Measurements: Height: 5\' 4"  (162.6 cm) Weight: 85.3 kg (188 lb 0.8 oz) IBW/kg (Calculated) : 54.7 HEPARIN  DW (KG): 73.5  Vital Signs: Temp: 97.5 F (36.4 C) (05/12 1600) Temp Source: Oral (05/12 1600) BP: 110/44 (05/12 1600) Pulse Rate: 78 (05/12 1600)  Labs: Recent Labs    10/08/23 0356 10/08/23 0940 10/08/23 2222 10/09/23 0440 10/09/23 0826 10/09/23 1347 10/10/23 0701 10/10/23 1818  HGB 9.3*  --   --  8.7*  --   --  7.9*  --   HCT 30.5*  --   --  27.6*  --   --  24.5*  --   PLT 281  --   --  224  --   --  206  --   APTT  --  >200*   < >  --  73* 78* 105* 54*  HEPARINUNFRC  --  >1.10*  --   --  >1.10*  --  >1.10*  --   CREATININE 1.60*  --   --  2.00*  --   --  1.63*  --    < > = values in this interval not displayed.    Estimated Creatinine Clearance: 30 mL/min (A) (by C-G formula based on SCr of 1.63 mg/dL (H)).   Medical History: Past Medical History:  Diagnosis Date   Diverticulosis    HTN (hypertension)    Hypothyroidism    Assessment: Pharmacy consulted to dose heparin  in patient with atrial fibrillation. Patient is on Eliquis  prior to admission with last dose 5/8 @ 2030.  Heparin  level still >1 due to recent apixaban  use. Aptt now below goal this evening. No bleeding issues noted however hemoglobin continues to trend down to 7.9 this morning. Surgery delayed until 5/14 due to afib  Goal of Therapy:  Heparin  level 0.3-0.7 units/ml aPTT 66-102 seconds Monitor platelets by anticoagulation protocol: Yes   Plan:  Increase heparin  to 550 units/hr Daily aptt/HL  Audra Blend PharmD., BCPS Clinical Pharmacist 10/10/2023 6:40 PM

## 2023-10-10 NOTE — Progress Notes (Signed)
 Progress Note   Patient: Felicia Frank ZHY:[id number] DOB: [birth year] DOA: 10/07/2023     3 DOS: the patient was seen and examined on 10/10/2023   Brief hospital admission narrative course: Felicia Frank is a 78 y.o. female with medical history significant of hypothyroidism, hypertension, chronic kidney disease stage IIIa, class I obesity, atrial fibrillation and recent hospitalization secondary to diverticulitis with perforation and contained abscess status post drain placement and enteral fistula formation; who presented to the hospital secondary to still ongoing abdominal pain, associated nausea/intermittent vomiting and difficulty keeping things down.   Workup in the ED demonstrating elevated WBCs and abnormal CT scan with concern for enteral fistula formation, contained abscess and diverticulitis.  Unfortunately given location of this new contained abscess no ability or safe margin for placement percutaneous drain.   Case was discussed with general surgery who recommended admission for IV antibiotics and anticipated surgical intervention on 10/10/2023.  Assessment and plan 1-diverticulitis of the colon with perforation and enterocolonic fistula -Continue current IV antibiotics - Continue as needed analgesics and antiemetics - Following general surgery recommendations plan will be for bowel prep as tolerated and intention to proceed with surgical repair on 10/12/2023. - No fever appreciated and patient's WBCs now within normal limits.  2-chronic paroxysmal atrial fibrillation with RVR - Will continue the use of adjusted dose IV metoprolol  and SR amiodarone  drip with loading dose - Continue heparin  for now and follow hemoglobin trend. - Continue holding Eliquis  with anticipated surgical intervention on 10/12/2023  3-essential hypertension - Blood pressures currently soft - Continue adjusted antihypertensive agents and follow vital signs.  4-hypothyroidism - Continue Synthroid . - If  unable to take p.o.'s we will transition to IV route (half of current Synthroid  dose when given IV).  5-acute kidney injury in the setting of chronic kidney disease stage IIIa - Creatinine improving with fluid resuscitation and currently down to 1.6 from 1.9 at time of admission - Will continue to maintain adequate hydration and follow renal function trend - Continue to minimize nephrotoxic agents.  6-hypokalemia - Continue to replete electrolyte with alcohol, potassium around 4 as much as possible - Potassium improved from 2.9-3.8 currently.  7-GERD - Continue PPI  8-compression fracture of L1 - Continue as needed analgesia - Will recommend continued use of TLSO brace at discharge.  9-class I obesity -Body mass index is 32.28 kg/m. - Low-calorie diet and portion control discussed with patient.  10-hypokalemia - In the setting of decreased oral intake and bowel prep - Replete electrolytes and follow trend.  Physical Exam:  Subjective: In no major distress; good saturation on room air.  Tolerating full liquid diet.  Patient is afebrile.  Still experiencing intermittent nausea and expressing intermittent left lower quadrant abdominal discomfort.  Vitals:   10/10/23 1200 10/10/23 1223 10/10/23 1300 10/10/23 1452  BP: (!) 127/53  (!) 126/37 (!) 84/40  Pulse: 69  (!) 121 72  Resp: (!) 26  (!) 21 18  Temp:  97.7 F (36.5 C)    TempSrc:  Oral    SpO2: 99%  (!) 52% 100%  Weight:      Height:       General exam: Alert, awake, oriented x 3; no fever, tolerating full liquid diet and reporting no nausea vomiting at time of evaluation.  Still experiencing intermittent left lower quadrant abdominal discomfort Respiratory system: Good saturation on room air; not using accessory muscles. Cardiovascular system: Rate controlled and currently sinus; no rubs or gallops. Gastrointestinal system: Abdomen is nondistended, soft and  mild discomfort in her left lower quadrant with deep  palpation.  Positive bowel sounds appreciated on exam. Central nervous system: Moving 4 limbs spontaneously.  No focal neurological deficits. Extremities: No cyanosis or clubbing. Skin: No petechiae. Psychiatry: Judgement and insight appear normal.  Flat affect appreciated on exam.  Latest data Reviewed: CBC: WBCs 7.2, hemoglobin 7.9 and platelet count 206K Magnesium : 1.9 Basic metabolic panel: Sodium 132, potassium 2.9, chloride 101, bicarb 21, BUN 20, creatinine 1.63 and GFR 32  Family Communication: Significant other at bedside.  Disposition: Status is: Inpatient Remains inpatient appropriate because: Continue IV antibiotics, metoprolol  and amiodarone  drip; follow electrolytes and replete as needed maintain adequate hydration.  Will continue to follow general surgery recommendations and will place PICC line to facilitate blood draws and infusions.  Planning for surgical intervention on 10/11/2023.  Will follow patient's response after surgical intervention to determine the need of any rehabilitation prior to returning home.   For on call review [email or web address].

## 2023-10-11 ENCOUNTER — Ambulatory Visit: Admitting: General Surgery

## 2023-10-11 DIAGNOSIS — K5720 Diverticulitis of large intestine with perforation and abscess without bleeding: Secondary | ICD-10-CM | POA: Diagnosis not present

## 2023-10-11 DIAGNOSIS — K5792 Diverticulitis of intestine, part unspecified, without perforation or abscess without bleeding: Secondary | ICD-10-CM | POA: Diagnosis not present

## 2023-10-11 DIAGNOSIS — K632 Fistula of intestine: Secondary | ICD-10-CM | POA: Diagnosis not present

## 2023-10-11 DIAGNOSIS — N179 Acute kidney failure, unspecified: Secondary | ICD-10-CM | POA: Diagnosis not present

## 2023-10-11 LAB — CBC
HCT: 24.1 % — ABNORMAL LOW (ref 36.0–46.0)
Hemoglobin: 7.9 g/dL — ABNORMAL LOW (ref 12.0–15.0)
MCH: 31 pg (ref 26.0–34.0)
MCHC: 32.8 g/dL (ref 30.0–36.0)
MCV: 94.5 fL (ref 80.0–100.0)
Platelets: 190 10*3/uL (ref 150–400)
RBC: 2.55 MIL/uL — ABNORMAL LOW (ref 3.87–5.11)
RDW: 15.3 % (ref 11.5–15.5)
WBC: 5.6 10*3/uL (ref 4.0–10.5)
nRBC: 0 % (ref 0.0–0.2)

## 2023-10-11 LAB — BASIC METABOLIC PANEL WITH GFR
Anion gap: 5 (ref 5–15)
BUN: 17 mg/dL (ref 8–23)
CO2: 23 mmol/L (ref 22–32)
Calcium: 8 mg/dL — ABNORMAL LOW (ref 8.9–10.3)
Chloride: 109 mmol/L (ref 98–111)
Creatinine, Ser: 1.74 mg/dL — ABNORMAL HIGH (ref 0.44–1.00)
GFR, Estimated: 30 mL/min — ABNORMAL LOW (ref >60)
Glucose, Bld: 79 mg/dL (ref 70–99)
Potassium: 4 mmol/L (ref 3.5–5.1)
Sodium: 137 mmol/L (ref 135–145)

## 2023-10-11 LAB — HEPARIN LEVEL (UNFRACTIONATED): Heparin Unfractionated: 0.65 [IU]/mL (ref 0.30–0.70)

## 2023-10-11 LAB — MAGNESIUM: Magnesium: 1.8 mg/dL (ref 1.7–2.4)

## 2023-10-11 LAB — APTT: aPTT: 92 s — ABNORMAL HIGH (ref 24–36)

## 2023-10-11 MED ORDER — METRONIDAZOLE 500 MG PO TABS
500.0000 mg | ORAL_TABLET | Freq: Three times a day (TID) | ORAL | Status: AC
  Administered 2023-10-12 (×3): 500 mg via ORAL
  Filled 2023-10-11 (×3): qty 1

## 2023-10-11 MED ORDER — METRONIDAZOLE 500 MG PO TABS: 500.0000 mg | ORAL_TABLET | Freq: Three times a day (TID) | ORAL | Status: DC

## 2023-10-11 MED ORDER — NEOMYCIN SULFATE 500 MG PO TABS
1000.0000 mg | ORAL_TABLET | Freq: Three times a day (TID) | ORAL | Status: AC
  Administered 2023-10-12 (×3): 1000 mg via ORAL
  Filled 2023-10-11 (×4): qty 2

## 2023-10-11 MED ORDER — MAGNESIUM HYDROXIDE 400 MG/5ML PO SUSP: 30.0000 mL | Freq: Two times a day (BID) | ORAL | Status: DC

## 2023-10-11 MED ORDER — NEOMYCIN SULFATE 500 MG PO TABS
1000.0000 mg | ORAL_TABLET | Freq: Three times a day (TID) | ORAL | Status: DC
  Filled 2023-10-11 (×5): qty 2

## 2023-10-11 MED ORDER — MAGNESIUM HYDROXIDE 400 MG/5ML PO SUSP
30.0000 mL | Freq: Two times a day (BID) | ORAL | Status: AC
  Administered 2023-10-12 (×2): 30 mL via ORAL
  Filled 2023-10-11 (×2): qty 30

## 2023-10-11 NOTE — Progress Notes (Signed)
 PHARMACY - ANTICOAGULATION CONSULT NOTE  Pharmacy Consult for heparin  Indication: atrial fibrillation  Allergies  Allergen Reactions   Amlodipine Swelling   Clonidine Rash    Rash with patch only.  Okay to take pill    Patient Measurements: Height: 5\' 4"  (162.6 cm) Weight: 85.3 kg (188 lb 0.8 oz) IBW/kg (Calculated) : 54.7 HEPARIN  DW (KG): 73.5  Vital Signs: Temp: 97.6 F (36.4 C) (05/13 0750) Temp Source: Oral (05/13 0750) BP: 95/60 (05/13 0730) Pulse Rate: 77 (05/13 0730)  Labs: Recent Labs    10/09/23 0440 10/09/23 0826 10/09/23 1347 10/10/23 0701 10/10/23 1818 10/11/23 0526  HGB 8.7*  --   --  7.9*  --  7.9*  HCT 27.6*  --   --  24.5*  --  24.1*  PLT 224  --   --  206  --  190  APTT  --  73*   < > 105* 54* 92*  HEPARINUNFRC  --  >1.10*  --  >1.10*  --  0.65  CREATININE 2.00*  --   --  1.63*  --   --    < > = values in this interval not displayed.    Estimated Creatinine Clearance: 30 mL/min (A) (by C-G formula based on SCr of 1.63 mg/dL (H)).   Medical History: Past Medical History:  Diagnosis Date   Diverticulosis    HTN (hypertension)    Hypothyroidism    Assessment: Pharmacy consulted to dose heparin  in patient with atrial fibrillation. Patient is on Eliquis  prior to admission with last dose 5/8 @ 2030.  Heparin  level still >1 due to recent apixaban  use. Aptt now at goal this morning. Levels now appear to be correlating. No bleeding issues noted however hemoglobin stable at 7.9 this morning. Surgery delayed until 5/14 due to afib  Goal of Therapy:  Heparin  level 0.3-0.7 units/ml aPTT 66-102 seconds Monitor platelets by anticoagulation protocol: Yes   Plan:  Continue heparin  at 550 units/hr Daily aptt/HL  Audra Blend PharmD., BCPS Clinical Pharmacist 10/11/2023 8:01 AM

## 2023-10-11 NOTE — Plan of Care (Signed)
  Problem: Clinical Measurements: Goal: Ability to maintain clinical measurements within normal limits will improve Outcome: Progressing Goal: Will remain free from infection Outcome: Progressing Goal: Diagnostic test results will improve Outcome: Progressing Goal: Respiratory complications will improve Outcome: Progressing   Problem: Activity: Goal: Risk for activity intolerance will decrease Outcome: Progressing   Problem: Coping: Goal: Level of anxiety will decrease Outcome: Progressing   Problem: Elimination: Goal: Will not experience complications related to bowel motility Outcome: Progressing Goal: Will not experience complications related to urinary retention Outcome: Progressing   Problem: Pain Managment: Goal: General experience of comfort will improve and/or be controlled Outcome: Progressing

## 2023-10-11 NOTE — Progress Notes (Signed)
 Progress Note   Patient: Felicia Frank WJX:[id number] DOB: 02/15/1946 DOA: 10/07/2023     4 DOS: the patient was seen and examined on 10/11/2023   Brief hospital admission narrative course: Felicia Frank is a 78 y.o. female with medical history significant of hypothyroidism, hypertension, chronic kidney disease stage IIIa, class I obesity, atrial fibrillation and recent hospitalization secondary to diverticulitis with perforation and contained abscess status post drain placement and enteral fistula formation; who presented to the hospital secondary to still ongoing abdominal pain, associated nausea/intermittent vomiting and difficulty keeping things down.   Workup in the ED demonstrating elevated WBCs and abnormal CT scan with concern for enteral fistula formation, contained abscess and diverticulitis.  Unfortunately given location of this new contained abscess no ability or safe margin for placement percutaneous drain.   Case was discussed with general surgery who recommended admission for IV antibiotics and anticipated surgical intervention on 10/10/2023.  Assessment and plan 1-diverticulitis of the colon with perforation and enterocolonic fistula -Continue current IV antibiotics - Continue as needed analgesics and antiemetics - Following general surgery recommendations plan will be for bowel prep as tolerated and intention to proceed with surgical repair on 5/14 vs 5/15 given water situation in the hospital. - No fever appreciated and patient's WBCs now within normal limits.  2-chronic paroxysmal atrial fibrillation with RVR - Will continue the use of adjusted dose IV metoprolol  and SR amiodarone  drip with loading dose - Continue heparin  for now and follow hemoglobin trend. - Continue holding Eliquis  with anticipated surgical intervention on 5/14 vs 5/15.  3-essential hypertension - Blood pressures currently soft - Continue adjusted antihypertensive agents and follow vital  signs.  4-hypothyroidism - Continue Synthroid . - If unable to take p.o.'s we will transition to IV route (half of current Synthroid  dose when given IV).  5-acute kidney injury in the setting of chronic kidney disease stage IIIa - Creatinine improving with fluid resuscitation and currently down to 1.6 from 1.9 at time of admission - Will continue to maintain adequate hydration and follow renal function trend - Continue to minimize nephrotoxic agents.  6-hypokalemia - Continue to replete electrolyte with alcohol, potassium around 4 as much as possible - Potassium improved from 2.9-3.8 currently.  7-GERD - Continue PPI  8-compression fracture of L1 - Continue as needed analgesia - Will recommend continued use of TLSO brace at discharge.  9-class I obesity -Body mass index is 32.28 kg/m. - Low-calorie diet and portion control discussed with patient.  10-hypokalemia - In the setting of decreased oral intake and bowel prep - Replete electrolytes and follow trend.  Physical Exam:  Subjective: In no distress; no overnight events.  Afebrile and expressing no chest pain or shortness of breath.  Vitals:   10/11/23 1539 10/11/23 1600 10/11/23 1615 10/11/23 1630  BP:  (!) 121/56 116/60 (!) 115/51  Pulse:  78 77 81  Resp:  (!) 21 (!) 25 (!) 29  Temp: 98.1 F (36.7 C)     TempSrc: Oral     SpO2:  100% 100% 100%  Weight:      Height:       General exam: Alert, awake, oriented x 3; in not acute distress.  Afebrile Respiratory system: Clear to auscultation. Respiratory effort normal.  Good saturation on room air. Cardiovascular system: Rate controlled. No rubs or gallops.  Currently sinus rhythm. Gastrointestinal system: Abdomen is nondistended, soft and nontender.  Positive bowel sounds appreciated. Central nervous system: Alert and oriented. No focal neurological deficits. Extremities: No cyanosis or  clubbing. Skin: No petechiae. Psychiatry: Judgement and insight appear normal.  Mood & affect appropriate.   Latest data Reviewed: CBC: WBCs 7.2, hemoglobin 7.9 and platelet count 206K Magnesium : 1.9 Basic metabolic panel: Sodium 132, potassium 2.9, chloride 101, bicarb 21, BUN 20, creatinine 1.63 and GFR 32  Family Communication: Significant other at bedside.  Disposition: Status is: Inpatient Remains inpatient appropriate because: Continue IV antibiotics, metoprolol  and amiodarone  drip; follow electrolytes and replete as needed maintain adequate hydration.  Will continue to follow general surgery recommendations and will place PICC line to facilitate blood draws and infusions.  Planning for surgical intervention on 5/14 or 5/15 given water problem in the hospital.  Will follow patient's response after surgical intervention to determine the need of any rehabilitation prior to returning home.   For on call review [email or web address].

## 2023-10-11 NOTE — Progress Notes (Signed)
 Patient will be prepped for surgery tomorrow if the OR is open.  Currently, there is a water main break in [place] and all surgeries are on hold at the present time.  Will go ahead and prep the patient's bowel later on today.  The risks and benefits of the surgery including bleeding, infection, cardiopulmonary difficulties, the possibility of a colostomy, and the possibility of a blood transfusion were fully explained to the patient, who gave informed consent.

## 2023-10-12 ENCOUNTER — Other Ambulatory Visit

## 2023-10-12 DIAGNOSIS — K5720 Diverticulitis of large intestine with perforation and abscess without bleeding: Secondary | ICD-10-CM | POA: Diagnosis not present

## 2023-10-12 LAB — CBC
HCT: 24 % — ABNORMAL LOW (ref 36.0–46.0)
Hemoglobin: 7.9 g/dL — ABNORMAL LOW (ref 12.0–15.0)
MCH: 31.2 pg (ref 26.0–34.0)
MCHC: 32.9 g/dL (ref 30.0–36.0)
MCV: 94.9 fL (ref 80.0–100.0)
Platelets: 178 10*3/uL (ref 150–400)
RBC: 2.53 MIL/uL — ABNORMAL LOW (ref 3.87–5.11)
RDW: 15.5 % (ref 11.5–15.5)
WBC: 4.5 10*3/uL (ref 4.0–10.5)
nRBC: 0 % (ref 0.0–0.2)

## 2023-10-12 LAB — PREPARE RBC (CROSSMATCH)

## 2023-10-12 LAB — HEPARIN LEVEL (UNFRACTIONATED): Heparin Unfractionated: 0.36 [IU]/mL (ref 0.30–0.70)

## 2023-10-12 MED ORDER — CHLORHEXIDINE GLUCONATE CLOTH 2 % EX PADS
6.0000 | MEDICATED_PAD | Freq: Once | CUTANEOUS | Status: AC
  Administered 2023-10-12: 6 via TOPICAL

## 2023-10-12 MED ORDER — SODIUM CHLORIDE 0.9 % IV SOLN
2.0000 g | INTRAVENOUS | Status: AC
  Administered 2023-10-13: 2 g via INTRAVENOUS
  Filled 2023-10-12: qty 2

## 2023-10-12 MED ORDER — ALVIMOPAN 12 MG PO CAPS
12.0000 mg | ORAL_CAPSULE | ORAL | Status: AC
  Administered 2023-10-13: 12 mg via ORAL
  Filled 2023-10-12: qty 1

## 2023-10-12 MED ORDER — TRAMADOL HCL 50 MG PO TABS
50.0000 mg | ORAL_TABLET | Freq: Once | ORAL | Status: AC
  Administered 2023-10-12: 50 mg via ORAL
  Filled 2023-10-12: qty 1

## 2023-10-12 NOTE — Progress Notes (Signed)
 PROGRESS NOTE    Felicia Frank  ZOX:[id number] DOB: 01/15/1946 DOA: 10/07/2023 PCP: Allana Ishikawa, MD   Brief Narrative:    Felicia Frank is a 78 y.o. female with medical history significant of hypothyroidism, hypertension, chronic kidney disease stage IIIa, class I obesity, atrial fibrillation and recent hospitalization secondary to diverticulitis with perforation and contained abscess status post drain placement and enteral fistula formation; who presented to the hospital secondary to still ongoing abdominal pain, associated nausea/intermittent vomiting and difficulty keeping things down.  Patient has been admitted with diverticulitis of the colon with perforation and enterocolonic fistula.  General surgery planning for surgical repair 5/15.  Assessment & Plan:   Principal Problem:   Diverticulitis of colon with perforation  Assessment and Plan:   1-diverticulitis of the colon with perforation and enterocolonic fistula -Continue current IV antibiotics - Continue as needed analgesics and antiemetics - Following general surgery recommendations plan will be for bowel prep as tolerated and intention to proceed with surgical repair on 5/15 given water situation in the hospital. - No fever appreciated and patient's WBCs now within normal limits.   2-chronic paroxysmal atrial fibrillation with RVR currently in sinus rhythm - Will continue the use of adjusted dose IV metoprolol  and SR amiodarone  drip with loading dose - Continue heparin  for now and follow hemoglobin trend.  Plan to discontinue heparin  at midnight. - Continue holding Eliquis  with anticipated surgical intervention on 5/15.   3-essential hypertension - Blood pressures currently soft - Continue adjusted antihypertensive agents and follow vital signs.   4-hypothyroidism - Continue Synthroid . - If unable to take p.o.'s we will transition to IV route (half of current Synthroid  dose when given IV).   5-acute kidney  injury in the setting of chronic kidney disease stage IIIa - Creatinine improving with fluid resuscitation and currently down to 1.6 from 1.9 at time of admission - Will continue to maintain adequate hydration and follow renal function trend - Continue to minimize nephrotoxic agents.   6-hypokalemia - Continue to replete electrolyte with alcohol, potassium around 4 as much as possible - Potassium improved from 2.9-3.8 currently.   7-GERD - Continue PPI   8-compression fracture of L1 - Continue as needed analgesia - Will recommend continued use of TLSO brace at discharge.   9-class I obesity -Body mass index is 32.28 kg/m. - Low-calorie diet and portion control discussed with patient.    DVT prophylaxis: Heparin  drip Code Status: Full Family Communication: None at bedside Disposition Plan:  Status is: Inpatient Remains inpatient appropriate because: Need for IV medications and surgical procedure.  Consultants:  General Surgery  Procedures:  None  Antimicrobials:  Anti-infectives (From admission, onward)    Start     Dose/Rate Route Frequency Ordered Stop   10/12/23 1300  metroNIDAZOLE  (FLAGYL ) tablet 500 mg        500 mg Oral 3 times daily 10/11/23 1212 10/13/23 1259   10/12/23 1300  neomycin (MYCIFRADIN) tablet 1,000 mg        1,000 mg Oral 3 times daily 10/11/23 1212 10/13/23 1259   10/11/23 1300  neomycin (MYCIFRADIN) tablet 1,000 mg  Status:  Discontinued        1,000 mg Oral 3 times daily 10/11/23 0839 10/11/23 1212   10/11/23 1300  metroNIDAZOLE  (FLAGYL ) tablet 500 mg  Status:  Discontinued        500 mg Oral 3 times daily 10/11/23 0839 10/11/23 1212   10/07/23 1500  piperacillin -tazobactam (ZOSYN ) IVPB 3.375 g  3.375 g 12.5 mL/hr over 240 Minutes Intravenous Every 8 hours 10/07/23 1356         Subjective: Patient seen and evaluated today with no new acute complaints or concerns. No acute concerns or events noted overnight.  Objective: Vitals:    10/12/23 0530 10/12/23 0600 10/12/23 0630 10/12/23 0700  BP: (!) 108/47 (!) 91/46 112/62 (!) 134/53  Pulse: 64 (!) 56 63 66  Resp: 10 11 (!) 9 12  Temp: (!) 97.5 F (36.4 C)     TempSrc: Axillary     SpO2: 100% 99% 97% 97%  Weight:      Height:       No intake or output data in the 24 hours ending 10/12/23 0810 Filed Weights   10/07/23 0911  Weight: 85.3 kg    Examination:  General exam: Appears calm and comfortable  Respiratory system: Clear to auscultation. Respiratory effort normal. Cardiovascular system: S1 & S2 heard, RRR.  Gastrointestinal system: Abdomen is soft Central nervous system: Alert and awake Extremities: No edema Skin: No significant lesions noted Psychiatry: Flat affect.    Data Reviewed: I have personally reviewed following labs and imaging studies  CBC: Recent Labs  Lab 10/07/23 0919 10/08/23 0356 10/09/23 0440 10/10/23 0701 10/11/23 0526 10/12/23 0427  WBC 11.2* 10.8* 10.8* 7.2 5.6 4.5  NEUTROABS 9.8*  --   --   --   --   --   HGB 10.6* 9.3* 8.7* 7.9* 7.9* 7.9*  HCT 33.5* 30.5* 27.6* 24.5* 24.1* 24.0*  MCV 96.5 99.3 98.9 97.6 94.5 94.9  PLT 304 281 224 206 190 178   Basic Metabolic Panel: Recent Labs  Lab 10/07/23 0919 10/08/23 0356 10/09/23 0440 10/10/23 0701 10/11/23 0526  NA 134* 137 135 132* 137  K 2.9* 3.8 3.6 2.9* 4.0  CL 98 104 106 101 109  CO2 22 24 22  21* 23  GLUCOSE 128* 113* 88 156* 79  BUN 17 18 25* 20 17  CREATININE 1.91* 1.60* 2.00* 1.63* 1.74*  CALCIUM 8.2* 7.8* 7.7* 7.7* 8.0*  MG 1.6*  --  1.9  --  1.8  PHOS  --   --  2.8  --   --    GFR: Estimated Creatinine Clearance: 28.1 mL/min (A) (by C-G formula based on SCr of 1.74 mg/dL (H)). Liver Function Tests: Recent Labs  Lab 10/07/23 0919  AST 22  ALT 14  ALKPHOS 53  BILITOT 0.7  PROT 5.6*  ALBUMIN 2.7*   Recent Labs  Lab 10/07/23 0919  LIPASE 32   No results for input(s): "AMMONIA" in the last 168 hours. Coagulation Profile: No results for  input(s): "INR", "PROTIME" in the last 168 hours. Cardiac Enzymes: No results for input(s): "CKTOTAL", "CKMB", "CKMBINDEX", "TROPONINI" in the last 168 hours. BNP (last 3 results) No results for input(s): "PROBNP" in the last 8760 hours. HbA1C: No results for input(s): "HGBA1C" in the last 72 hours. CBG: No results for input(s): "GLUCAP" in the last 168 hours. Lipid Profile: No results for input(s): "CHOL", "HDL", "LDLCALC", "TRIG", "CHOLHDL", "LDLDIRECT" in the last 72 hours. Thyroid Function Tests: No results for input(s): "TSH", "T4TOTAL", "FREET4", "T3FREE", "THYROIDAB" in the last 72 hours. Anemia Panel: No results for input(s): "VITAMINB12", "FOLATE", "FERRITIN", "TIBC", "IRON", "RETICCTPCT" in the last 72 hours. Sepsis Labs: No results for input(s): "PROCALCITON", "LATICACIDVEN" in the last 168 hours.  No results found for this or any previous visit (from the past 240 hours).       Radiology Studies: No results  found.      Scheduled Meds:  Chlorhexidine  Gluconate Cloth  6 each Topical Daily   doxazosin   4 mg Oral QHS   levothyroxine   50 mcg Oral Daily   magnesium  hydroxide  30 mL Oral q12n4p   metoprolol  tartrate  2.5 mg Intravenous Q12H   metroNIDAZOLE   500 mg Oral TID   neomycin  1,000 mg Oral TID   sodium chloride  flush  10-40 mL Intracatheter Q12H   Continuous Infusions:  amiodarone  30 mg/hr (10/12/23 0704)   heparin  550 Units/hr (10/11/23 0205)   piperacillin -tazobactam (ZOSYN )  IV 3.375 g (10/12/23 0607)     LOS: 5 days    Time spent: 55 minutes    Evelin Cake D Mason Sole, DO [facility]  If 7PM-7AM, please contact night-coverage [email or web address] 10/12/2023, 8:10 AM

## 2023-10-12 NOTE — H&P (View-Only) (Signed)
  Subjective: No new complaints.  Objective: Vital signs in last 24 hours: Temp:  [97.5 F (36.4 C)-98.2 F (36.8 C)] 97.5 F (36.4 C) (05/14 0530) Pulse Rate:  [56-81] 63 (05/14 0800) Resp:  [9-29] 11 (05/14 0800) BP: (89-158)/(30-72) 124/65 (05/14 0800) SpO2:  [96 %-100 %] 98 % (05/14 0800) Last BM Date : 10/11/23  Intake/Output from previous day: No intake/output data recorded. Intake/Output this shift: No intake/output data recorded.  General appearance: alert, cooperative, and no distress Cardio: regular rate and rhythm, S1, S2 normal, no murmur, click, rub or gallop GI: soft, non-tender; bowel sounds normal; no masses,  no organomegaly  Lab Results:  Recent Labs    10/11/23 0526 10/12/23 0427  WBC 5.6 4.5  HGB 7.9* 7.9*  HCT 24.1* 24.0*  PLT 190 178   BMET Recent Labs    10/10/23 0701 10/11/23 0526  NA 132* 137  K 2.9* 4.0  CL 101 109  CO2 21* 23  GLUCOSE 156* 79  BUN 20 17  CREATININE 1.63* 1.74*  CALCIUM 7.7* 8.0*   PT/INR No results for input(s): "LABPROT", "INR" in the last 72 hours.  Studies/Results: No results found.  Anti-infectives: Anti-infectives (From admission, onward)    Start     Dose/Rate Route Frequency Ordered Stop   10/12/23 1300  metroNIDAZOLE  (FLAGYL ) tablet 500 mg        500 mg Oral 3 times daily 10/11/23 1212 10/13/23 1259   10/12/23 1300  neomycin (MYCIFRADIN) tablet 1,000 mg        1,000 mg Oral 3 times daily 10/11/23 1212 10/13/23 1259   10/11/23 1300  neomycin (MYCIFRADIN) tablet 1,000 mg  Status:  Discontinued        1,000 mg Oral 3 times daily 10/11/23 0839 10/11/23 1212   10/11/23 1300  metroNIDAZOLE  (FLAGYL ) tablet 500 mg  Status:  Discontinued        500 mg Oral 3 times daily 10/11/23 0839 10/11/23 1212   10/07/23 1500  piperacillin -tazobactam (ZOSYN ) IVPB 3.375 g        3.375 g 12.5 mL/hr over 240 Minutes Intravenous Every 8 hours 10/07/23 1356         Assessment/Plan: Impression: Sigmoid diverticulitis  with coloenteric fistula.  Currently in normal sinus rhythm on heparin  drip. Plan: Patient is scheduled for partial colectomy with possible colostomy, possible small bowel resection tomorrow.  She will undergo bowel prep today.  Risks and benefits of the procedure including bleeding, infection, cardiopulmonary difficulties, the possibility of a colostomy, and the possibility of a blood transfusion were fully explained to the patient, who gave informed consent.  I have contacted pharmacy and her heparin  drip will be stopped at midnight tonight.  LOS: 5 days    [name] 10/12/2023

## 2023-10-12 NOTE — Progress Notes (Signed)
  Subjective: No new complaints.  Objective: Vital signs in last 24 hours: Temp:  [97.5 F (36.4 C)-98.2 F (36.8 C)] 97.5 F (36.4 C) (05/14 0530) Pulse Rate:  [56-81] 63 (05/14 0800) Resp:  [9-29] 11 (05/14 0800) BP: (89-158)/(30-72) 124/65 (05/14 0800) SpO2:  [96 %-100 %] 98 % (05/14 0800) Last BM Date : 10/11/23  Intake/Output from previous day: No intake/output data recorded. Intake/Output this shift: No intake/output data recorded.  General appearance: alert, cooperative, and no distress Cardio: regular rate and rhythm, S1, S2 normal, no murmur, click, rub or gallop GI: soft, non-tender; bowel sounds normal; no masses,  no organomegaly  Lab Results:  Recent Labs    10/11/23 0526 10/12/23 0427  WBC 5.6 4.5  HGB 7.9* 7.9*  HCT 24.1* 24.0*  PLT 190 178   BMET Recent Labs    10/10/23 0701 10/11/23 0526  NA 132* 137  K 2.9* 4.0  CL 101 109  CO2 21* 23  GLUCOSE 156* 79  BUN 20 17  CREATININE 1.63* 1.74*  CALCIUM 7.7* 8.0*   PT/INR No results for input(s): "LABPROT", "INR" in the last 72 hours.  Studies/Results: No results found.  Anti-infectives: Anti-infectives (From admission, onward)    Start     Dose/Rate Route Frequency Ordered Stop   10/12/23 1300  metroNIDAZOLE  (FLAGYL ) tablet 500 mg        500 mg Oral 3 times daily 10/11/23 1212 10/13/23 1259   10/12/23 1300  neomycin (MYCIFRADIN) tablet 1,000 mg        1,000 mg Oral 3 times daily 10/11/23 1212 10/13/23 1259   10/11/23 1300  neomycin (MYCIFRADIN) tablet 1,000 mg  Status:  Discontinued        1,000 mg Oral 3 times daily 10/11/23 0839 10/11/23 1212   10/11/23 1300  metroNIDAZOLE  (FLAGYL ) tablet 500 mg  Status:  Discontinued        500 mg Oral 3 times daily 10/11/23 0839 10/11/23 1212   10/07/23 1500  piperacillin -tazobactam (ZOSYN ) IVPB 3.375 g        3.375 g 12.5 mL/hr over 240 Minutes Intravenous Every 8 hours 10/07/23 1356         Assessment/Plan: Impression: Sigmoid diverticulitis  with coloenteric fistula.  Currently in normal sinus rhythm on heparin  drip. Plan: Patient is scheduled for partial colectomy with possible colostomy, possible small bowel resection tomorrow.  She will undergo bowel prep today.  Risks and benefits of the procedure including bleeding, infection, cardiopulmonary difficulties, the possibility of a colostomy, and the possibility of a blood transfusion were fully explained to the patient, who gave informed consent.  I have contacted pharmacy and her heparin  drip will be stopped at midnight tonight.  LOS: 5 days    [name] 10/12/2023

## 2023-10-12 NOTE — Progress Notes (Signed)
 PHARMACY - ANTICOAGULATION CONSULT NOTE  Pharmacy Consult for heparin  Indication: atrial fibrillation  Allergies  Allergen Reactions   Amlodipine Swelling   Clonidine Rash    Rash with patch only.  Okay to take pill    Patient Measurements: Height: 5\' 4"  (162.6 cm) Weight: 85.3 kg (188 lb 0.8 oz) IBW/kg (Calculated) : 54.7 HEPARIN  DW (KG): 73.5  Vital Signs: Temp: 97.5 F (36.4 C) (05/14 0530) Temp Source: Axillary (05/14 0530) BP: 124/65 (05/14 0800) Pulse Rate: 63 (05/14 0800)  Labs: Recent Labs    10/10/23 0701 10/10/23 1818 10/11/23 0526 10/12/23 0427  HGB 7.9*  --  7.9* 7.9*  HCT 24.5*  --  24.1* 24.0*  PLT 206  --  190 178  APTT 105* 54* 92*  --   HEPARINUNFRC >1.10*  --  0.65 0.36  CREATININE 1.63*  --  1.74*  --     Estimated Creatinine Clearance: 28.1 mL/min (A) (by C-G formula based on SCr of 1.74 mg/dL (H)).   Medical History: Past Medical History:  Diagnosis Date   Diverticulosis    HTN (hypertension)    Hypothyroidism    Assessment: Pharmacy consulted to dose heparin  in patient with atrial fibrillation. Patient is on Eliquis  prior to admission with last dose 5/8 @ 2030.  Heparin  level 0.36, therapeutic.  No bleeding issues noted ,  hemoglobin stable at 7.9 this morning. Surgery planned for  5/15 hemicolectomy.  MD wants to stop heparin  at midnight for procedure in AM  Goal of Therapy:  Heparin  level 0.3-0.7 units/ml aPTT 66-102 seconds Monitor platelets by anticoagulation protocol: Yes   Plan:  Continue heparin  at 550 units/hr until midnight 10/12/23 F/u plans post procedure 5/15  Sterlin Knightly, BS Pharm D, BCPS Clinical Pharmacist \\5 /14/2025 8:30 AM

## 2023-10-13 ENCOUNTER — Other Ambulatory Visit: Payer: Self-pay

## 2023-10-13 ENCOUNTER — Encounter (HOSPITAL_COMMUNITY): Payer: Self-pay | Admitting: Internal Medicine

## 2023-10-13 ENCOUNTER — Inpatient Hospital Stay (HOSPITAL_COMMUNITY): Payer: Self-pay | Admitting: Certified Registered Nurse Anesthetist

## 2023-10-13 ENCOUNTER — Encounter (HOSPITAL_COMMUNITY)
Admission: EM | Disposition: A | Discharge status: Skilled Nursing Facility | Payer: Self-pay | Source: Home / Self Care | Attending: Internal Medicine

## 2023-10-13 DIAGNOSIS — K5720 Diverticulitis of large intestine with perforation and abscess without bleeding: Secondary | ICD-10-CM | POA: Diagnosis not present

## 2023-10-13 DIAGNOSIS — N1831 Chronic kidney disease, stage 3a: Secondary | ICD-10-CM | POA: Diagnosis not present

## 2023-10-13 DIAGNOSIS — K5732 Diverticulitis of large intestine without perforation or abscess without bleeding: Secondary | ICD-10-CM

## 2023-10-13 DIAGNOSIS — E039 Hypothyroidism, unspecified: Secondary | ICD-10-CM | POA: Diagnosis not present

## 2023-10-13 DIAGNOSIS — I129 Hypertensive chronic kidney disease with stage 1 through stage 4 chronic kidney disease, or unspecified chronic kidney disease: Secondary | ICD-10-CM

## 2023-10-13 DIAGNOSIS — K632 Fistula of intestine: Secondary | ICD-10-CM | POA: Diagnosis not present

## 2023-10-13 LAB — CBC
HCT: 23.1 % — ABNORMAL LOW (ref 36.0–46.0)
HCT: 28.7 % — ABNORMAL LOW (ref 36.0–46.0)
Hemoglobin: 7.6 g/dL — ABNORMAL LOW (ref 12.0–15.0)
Hemoglobin: 10 g/dL — ABNORMAL LOW (ref 12.0–15.0)
MCH: 31.1 pg (ref 26.0–34.0)
MCH: 31 pg (ref 26.0–34.0)
MCHC: 32.9 g/dL (ref 30.0–36.0)
MCHC: 34.8 g/dL (ref 30.0–36.0)
MCV: 94.7 fL (ref 80.0–100.0)
MCV: 88.9 fL (ref 80.0–100.0)
Platelets: 163 10*3/uL (ref 150–400)
Platelets: 145 10*3/uL — ABNORMAL LOW (ref 150–400)
RBC: 2.44 MIL/uL — ABNORMAL LOW (ref 3.87–5.11)
RBC: 3.23 MIL/uL — ABNORMAL LOW (ref 3.87–5.11)
RDW: 15.5 % (ref 11.5–15.5)
RDW: 17.3 % — ABNORMAL HIGH (ref 11.5–15.5)
WBC: 4.2 10*3/uL (ref 4.0–10.5)
WBC: 23.1 10*3/uL — ABNORMAL HIGH (ref 4.0–10.5)
nRBC: 0 % (ref 0.0–0.2)
nRBC: 0 % (ref 0.0–0.2)

## 2023-10-13 LAB — BPAM RBC
Blood Product Expiration Date: 202505272359
Blood Product Expiration Date: 202505272359
ISSUE DATE / TIME: 202505151444
ISSUE DATE / TIME: 202505151444
Unit Type and Rh: 6200
Unit Type and Rh: 6200

## 2023-10-13 LAB — BASIC METABOLIC PANEL WITH GFR
Anion gap: 8 (ref 5–15)
BUN: 14 mg/dL (ref 8–23)
CO2: 22 mmol/L (ref 22–32)
Calcium: 7.8 mg/dL — ABNORMAL LOW (ref 8.9–10.3)
Chloride: 106 mmol/L (ref 98–111)
Creatinine, Ser: 1.45 mg/dL — ABNORMAL HIGH (ref 0.44–1.00)
GFR, Estimated: 37 mL/min — ABNORMAL LOW (ref >60)
Glucose, Bld: 70 mg/dL (ref 70–99)
Potassium: 3.5 mmol/L (ref 3.5–5.1)
Sodium: 136 mmol/L (ref 135–145)

## 2023-10-13 LAB — TYPE AND SCREEN
ABO/RH(D): A POS
Antibody Screen: NEGATIVE
Unit division: 0
Unit division: 0
Unit division: 0

## 2023-10-13 LAB — PREPARE RBC (CROSSMATCH)

## 2023-10-13 LAB — MAGNESIUM: Magnesium: 1.6 mg/dL — ABNORMAL LOW (ref 1.7–2.4)

## 2023-10-13 SURGERY — COLECTOMY, WITH COLOSTOMY CREATION
Anesthesia: General | Site: Abdomen

## 2023-10-13 MED ORDER — SODIUM CHLORIDE 0.9 % IV SOLN
INTRAVENOUS | Status: AC
  Filled 2023-10-13: qty 2

## 2023-10-13 MED ORDER — EPHEDRINE 5 MG/ML INJ
INTRAVENOUS | Status: AC
  Filled 2023-10-13: qty 5

## 2023-10-13 MED ORDER — CHLORHEXIDINE GLUCONATE CLOTH 2 % EX PADS: 6.0000 | MEDICATED_PAD | Freq: Once | CUTANEOUS | Status: DC

## 2023-10-13 MED ORDER — ALBUMIN HUMAN 5 % IV SOLN
INTRAVENOUS | Status: AC
  Filled 2023-10-13: qty 250

## 2023-10-13 MED ORDER — CHLORHEXIDINE GLUCONATE 0.12 % MT SOLN
15.0000 mL | Freq: Once | OROMUCOSAL | Status: AC
  Administered 2023-10-13: 15 mL via OROMUCOSAL

## 2023-10-13 MED ORDER — FUROSEMIDE 10 MG/ML IJ SOLN: 20.0000 mg | Freq: Two times a day (BID) | INTRAMUSCULAR | Status: DC

## 2023-10-13 MED ORDER — HYDROMORPHONE HCL 1 MG/ML IJ SOLN
1.0000 mg | INTRAMUSCULAR | Status: DC | PRN
  Administered 2023-10-13 – 2023-10-24 (×29): 1 mg via INTRAVENOUS
  Filled 2023-10-13 (×31): qty 1

## 2023-10-13 MED ORDER — PROPOFOL 10 MG/ML IV BOLUS
INTRAVENOUS | Status: DC | PRN
  Administered 2023-10-13: 35 ug/kg/min via INTRAVENOUS
  Administered 2023-10-13: 150 mg via INTRAVENOUS

## 2023-10-13 MED ORDER — POVIDONE-IODINE 10 % EX OINT
TOPICAL_OINTMENT | CUTANEOUS | Status: AC
  Filled 2023-10-13: qty 28.4

## 2023-10-13 MED ORDER — BUPIVACAINE HCL (PF) 0.5 % IJ SOLN
INTRAMUSCULAR | Status: DC | PRN
Start: 2023-10-13 — End: 2023-10-13
  Administered 2023-10-13: 30 mL

## 2023-10-13 MED ORDER — SODIUM CHLORIDE 0.9 % IR SOLN
Status: DC | PRN
  Administered 2023-10-13: 1000 mL

## 2023-10-13 MED ORDER — PROPOFOL 500 MG/50ML IV EMUL
INTRAVENOUS | Status: AC
  Filled 2023-10-13: qty 100

## 2023-10-13 MED ORDER — PHENYLEPHRINE 80 MCG/ML (10ML) SYRINGE FOR IV PUSH (FOR BLOOD PRESSURE SUPPORT)
PREFILLED_SYRINGE | INTRAVENOUS | Status: AC
  Filled 2023-10-13: qty 10

## 2023-10-13 MED ORDER — ALVIMOPAN 12 MG PO CAPS: 12.0000 mg | ORAL_CAPSULE | ORAL | Status: DC

## 2023-10-13 MED ORDER — VASOPRESSIN 20 UNIT/ML IV SOLN
INTRAVENOUS | Status: DC | PRN
  Administered 2023-10-13: 2 [IU] via INTRAVENOUS

## 2023-10-13 MED ORDER — SODIUM CHLORIDE (PF) 0.9 % IJ SOLN
INTRAMUSCULAR | Status: AC
  Filled 2023-10-13: qty 20

## 2023-10-13 MED ORDER — PANTOPRAZOLE SODIUM 40 MG IV SOLR
40.0000 mg | Freq: Every day | INTRAVENOUS | Status: DC
  Administered 2023-10-13 – 2023-10-18 (×6): 40 mg via INTRAVENOUS
  Filled 2023-10-13 (×6): qty 10

## 2023-10-13 MED ORDER — MIDAZOLAM HCL 2 MG/2ML IJ SOLN
INTRAMUSCULAR | Status: AC
  Filled 2023-10-13: qty 2

## 2023-10-13 MED ORDER — SCOPOLAMINE 1 MG/3DAYS TD PT72
1.0000 | MEDICATED_PATCH | TRANSDERMAL | Status: DC
  Administered 2023-10-13: 1.5 mg via TRANSDERMAL

## 2023-10-13 MED ORDER — ALVIMOPAN 12 MG PO CAPS
ORAL_CAPSULE | ORAL | Status: AC
  Filled 2023-10-13: qty 1

## 2023-10-13 MED ORDER — OXYCODONE HCL 5 MG/5ML PO SOLN: 5.0000 mg | Freq: Once | ORAL | Status: DC | PRN

## 2023-10-13 MED ORDER — PHENYLEPHRINE HCL-NACL 20-0.9 MG/250ML-% IV SOLN: INTRAVENOUS | Status: DC | PRN

## 2023-10-13 MED ORDER — ACETAMINOPHEN 10 MG/ML IV SOLN
INTRAVENOUS | Status: AC
  Filled 2023-10-13: qty 100

## 2023-10-13 MED ORDER — SUCCINYLCHOLINE CHLORIDE 20 MG/ML IJ SOLN
INTRAMUSCULAR | Status: DC | PRN
  Administered 2023-10-13: 160 mg via INTRAVENOUS

## 2023-10-13 MED ORDER — SODIUM CHLORIDE 0.9% IV SOLUTION: Freq: Once | INTRAVENOUS | Status: DC

## 2023-10-13 MED ORDER — EPHEDRINE SULFATE-NACL 50-0.9 MG/10ML-% IV SOSY
PREFILLED_SYRINGE | INTRAVENOUS | Status: DC | PRN
  Administered 2023-10-13: 5 mg via INTRAVENOUS
  Administered 2023-10-13: 10 mg via INTRAVENOUS

## 2023-10-13 MED ORDER — ROCURONIUM BROMIDE 10 MG/ML (PF) SYRINGE
PREFILLED_SYRINGE | INTRAVENOUS | Status: AC
  Filled 2023-10-13: qty 10

## 2023-10-13 MED ORDER — LEVOTHYROXINE SODIUM 25 MCG PO TABS
50.0000 ug | ORAL_TABLET | Freq: Every day | ORAL | Status: DC
  Administered 2023-10-15 – 2023-10-27 (×13): 50 ug via ORAL
  Filled 2023-10-13 (×13): qty 2

## 2023-10-13 MED ORDER — CHLORHEXIDINE GLUCONATE CLOTH 2 % EX PADS
6.0000 | MEDICATED_PAD | Freq: Once | CUTANEOUS | Status: DC
Start: 2023-10-13 — End: 2023-10-13

## 2023-10-13 MED ORDER — DEXAMETHASONE SODIUM PHOSPHATE 10 MG/ML IJ SOLN
INTRAMUSCULAR | Status: DC | PRN
  Administered 2023-10-13: 10 mg via INTRAVENOUS

## 2023-10-13 MED ORDER — BUPIVACAINE HCL (PF) 0.5 % IJ SOLN
INTRAMUSCULAR | Status: AC
  Filled 2023-10-13: qty 30

## 2023-10-13 MED ORDER — FENTANYL CITRATE (PF) 100 MCG/2ML IJ SOLN
INTRAMUSCULAR | Status: DC | PRN
  Administered 2023-10-13 (×2): 50 ug via INTRAVENOUS

## 2023-10-13 MED ORDER — ROCURONIUM BROMIDE 100 MG/10ML IV SOLN
INTRAVENOUS | Status: DC | PRN
  Administered 2023-10-13: 35 mg via INTRAVENOUS
  Administered 2023-10-13: 30 mg via INTRAVENOUS
  Administered 2023-10-13: 40 mg via INTRAVENOUS

## 2023-10-13 MED ORDER — PHENYLEPHRINE 80 MCG/ML (10ML) SYRINGE FOR IV PUSH (FOR BLOOD PRESSURE SUPPORT)
PREFILLED_SYRINGE | INTRAVENOUS | Status: DC | PRN
  Administered 2023-10-13 (×2): 160 ug via INTRAVENOUS
  Administered 2023-10-13: 80 ug via INTRAVENOUS

## 2023-10-13 MED ORDER — ONDANSETRON HCL 4 MG/2ML IJ SOLN
INTRAMUSCULAR | Status: DC | PRN
  Administered 2023-10-13: 4 mg via INTRAVENOUS

## 2023-10-13 MED ORDER — HYDROMORPHONE HCL 1 MG/ML IJ SOLN
INTRAMUSCULAR | Status: AC
  Filled 2023-10-13: qty 1

## 2023-10-13 MED ORDER — VASOPRESSIN 20 UNIT/ML IV SOLN
INTRAVENOUS | Status: AC
  Filled 2023-10-13: qty 1

## 2023-10-13 MED ORDER — SUGAMMADEX SODIUM 200 MG/2ML IV SOLN
INTRAVENOUS | Status: DC | PRN
  Administered 2023-10-13: 200 mg via INTRAVENOUS

## 2023-10-13 MED ORDER — ACETAMINOPHEN 10 MG/ML IV SOLN
INTRAVENOUS | Status: DC | PRN
Start: 2023-10-13 — End: 2023-10-13
  Administered 2023-10-13: 1000 mg via INTRAVENOUS

## 2023-10-13 MED ORDER — PHENYLEPHRINE HCL-NACL 20-0.9 MG/250ML-% IV SOLN
INTRAVENOUS | Status: AC
  Filled 2023-10-13: qty 250

## 2023-10-13 MED ORDER — LACTATED RINGERS IV SOLN: INTRAVENOUS | Status: DC

## 2023-10-13 MED ORDER — HYDROMORPHONE HCL 1 MG/ML IJ SOLN
INTRAMUSCULAR | Status: DC | PRN
  Administered 2023-10-13 (×2): .5 mg via INTRAVENOUS

## 2023-10-13 MED ORDER — FENTANYL CITRATE (PF) 100 MCG/2ML IJ SOLN
INTRAMUSCULAR | Status: AC
  Filled 2023-10-13: qty 2

## 2023-10-13 MED ORDER — MAGNESIUM SULFATE 2 GM/50ML IV SOLN
2.0000 g | Freq: Once | INTRAVENOUS | Status: AC
  Administered 2023-10-13: 2 g via INTRAVENOUS
  Filled 2023-10-13: qty 50

## 2023-10-13 MED ORDER — CHLORHEXIDINE GLUCONATE 0.12 % MT SOLN
OROMUCOSAL | Status: AC
  Filled 2023-10-13: qty 15

## 2023-10-13 MED ORDER — OXYCODONE HCL 5 MG PO TABS: 5.0000 mg | ORAL_TABLET | Freq: Once | ORAL | Status: DC | PRN

## 2023-10-13 MED ORDER — HEMOSTATIC AGENTS (NO CHARGE) OPTIME
TOPICAL | Status: DC | PRN
  Administered 2023-10-13: 1 via TOPICAL

## 2023-10-13 MED ORDER — SCOPOLAMINE 1 MG/3DAYS TD PT72
MEDICATED_PATCH | TRANSDERMAL | Status: AC
  Filled 2023-10-13: qty 1

## 2023-10-13 MED ORDER — PHENYLEPHRINE HCL-NACL 20-0.9 MG/250ML-% IV SOLN
INTRAVENOUS | Status: DC | PRN
  Administered 2023-10-13: 30 ug/min via INTRAVENOUS

## 2023-10-13 MED ORDER — ALBUMIN HUMAN 5 % IV SOLN: INTRAVENOUS | Status: DC | PRN

## 2023-10-13 MED ORDER — CHLORHEXIDINE GLUCONATE CLOTH 2 % EX PADS
6.0000 | MEDICATED_PAD | Freq: Every day | CUTANEOUS | Status: DC
  Administered 2023-10-13 – 2023-10-27 (×16): 6 via TOPICAL

## 2023-10-13 MED ORDER — SODIUM CHLORIDE 0.9 % IV SOLN: 10.0000 mL/h | Freq: Once | INTRAVENOUS | Status: DC

## 2023-10-13 MED ORDER — ORAL CARE MOUTH RINSE: 15.0000 mL | Freq: Once | OROMUCOSAL | Status: AC

## 2023-10-13 MED ORDER — ONDANSETRON HCL 4 MG/2ML IJ SOLN: 4.0000 mg | Freq: Once | INTRAMUSCULAR | Status: DC | PRN

## 2023-10-13 MED ORDER — SODIUM CHLORIDE 0.9 % IV SOLN: INTRAVENOUS | Status: DC | PRN

## 2023-10-13 MED ORDER — FENTANYL CITRATE PF 50 MCG/ML IJ SOSY: 25.0000 ug | PREFILLED_SYRINGE | INTRAMUSCULAR | Status: DC | PRN

## 2023-10-13 MED ORDER — PROPOFOL 500 MG/50ML IV EMUL
INTRAVENOUS | Status: AC
  Filled 2023-10-13: qty 50

## 2023-10-13 SURGICAL SUPPLY — 54 items
BENZOIN TINCTURE PRP APPL 2/3 (GAUZE/BANDAGES/DRESSINGS) ×1 IMPLANT
CHLORAPREP W/TINT 26 (MISCELLANEOUS) ×1 IMPLANT
COVER LIGHT HANDLE STERIS (MISCELLANEOUS) ×2 IMPLANT
DRAPE WARM FLUID 44X44 (DRAPES) ×1 IMPLANT
DRSG OPSITE POSTOP 4X10 (GAUZE/BANDAGES/DRESSINGS) IMPLANT
ELECT BLADE 6 FLAT ULTRCLN (ELECTRODE) ×1 IMPLANT
ELECTRODE REM PT RTRN 9FT ADLT (ELECTROSURGICAL) ×1 IMPLANT
EVACUATOR DRAINAGE 10X20 100CC (DRAIN) IMPLANT
GAUZE 4X4 16PLY ~~LOC~~+RFID DBL (SPONGE) IMPLANT
GAUZE SPONGE 4X4 12PLY STRL (GAUZE/BANDAGES/DRESSINGS) ×1 IMPLANT
GLOVE BIO SURGEON STRL SZ7 (GLOVE) IMPLANT
GLOVE BIOGEL PI IND STRL 7.0 (GLOVE) ×2 IMPLANT
GLOVE BIOGEL PI IND STRL 7.5 (GLOVE) IMPLANT
GLOVE ECLIPSE 6.5 STRL STRAW (GLOVE) IMPLANT
GLOVE SURG SS PI 6.5 STRL IVOR (GLOVE) IMPLANT
GLOVE SURG SS PI 7.5 STRL IVOR (GLOVE) ×2 IMPLANT
GOWN STRL REUS W/TWL LRG LVL3 (GOWN DISPOSABLE) ×3 IMPLANT
HANDLE SUCTION POOLE (INSTRUMENTS) ×1 IMPLANT
INST SET MAJOR GENERAL (KITS) ×1 IMPLANT
KIT TURNOVER KIT A (KITS) ×1 IMPLANT
LIGASURE IMPACT 36 18CM CVD LR (INSTRUMENTS) ×1 IMPLANT
MANIFOLD NEPTUNE II (INSTRUMENTS) ×1 IMPLANT
NDL HYPO 21X1.5 SAFETY (NEEDLE) ×1 IMPLANT
NEEDLE HYPO 21X1.5 SAFETY (NEEDLE) ×1 IMPLANT
NS IRRIG 1000ML POUR BTL (IV SOLUTION) ×2 IMPLANT
PACK ABDOMINAL MAJOR (CUSTOM PROCEDURE TRAY) ×1 IMPLANT
PAD ARMBOARD POSITIONER FOAM (MISCELLANEOUS) ×1 IMPLANT
PENCIL HANDSWITCHING (ELECTRODE) IMPLANT
PENCIL SMOKE EVACUATOR (MISCELLANEOUS) ×1 IMPLANT
POSITIONER HEAD 8X9X4 ADT (SOFTGOODS) ×1 IMPLANT
POUCH OSTOMY 2 PC DRNBL 2.75 (WOUND CARE) IMPLANT
POWDER SURGICEL 3.0 GRAM (HEMOSTASIS) IMPLANT
RELOAD PROXIMATE 75MM BLUE (ENDOMECHANICALS) ×8 IMPLANT
RELOAD PROXIMATE TA60MM BLUE (ENDOMECHANICALS) ×1 IMPLANT
RELOAD STAPLE 60 BLU REG PROX (ENDOMECHANICALS) IMPLANT
RELOAD STAPLE 75 3.8 BLU REG (ENDOMECHANICALS) IMPLANT
RETRACTOR WND ALEXIS-O 25 LRG (MISCELLANEOUS) ×1 IMPLANT
RETRACTOR WOUND ALXS 25CM LRG (MISCELLANEOUS) ×1 IMPLANT
SET BASIN LINEN APH (SET/KITS/TRAYS/PACK) ×1 IMPLANT
SPIKE FLUID TRANSFER (MISCELLANEOUS) IMPLANT
SPONGE DRAIN TRACH 4X4 STRL 2S (GAUZE/BANDAGES/DRESSINGS) IMPLANT
SPONGE T-LAP 18X18 ~~LOC~~+RFID (SPONGE) ×2 IMPLANT
STAPLER CVD CUT GN 40 RELOAD (ENDOMECHANICALS) ×1 IMPLANT
STAPLER CVD CUT GRN 40 RELOAD (ENDOMECHANICALS) IMPLANT
STAPLER PROXIMATE 75MM BLUE (STAPLE) IMPLANT
STAPLER VISISTAT (STAPLE) ×1 IMPLANT
SUT CHROMIC 3 0 PS 2 (SUTURE) ×4 IMPLANT
SUT PDS AB 0 CTX 60 (SUTURE) ×2 IMPLANT
SUT SILK 2-0 18XBRD TIE 12 (SUTURE) IMPLANT
SUT SILK 3 0 SH CR/8 (SUTURE) ×1 IMPLANT
SWAB CULTURE ESWAB REG 1ML (MISCELLANEOUS) IMPLANT
SYR 30ML LL (SYRINGE) ×2 IMPLANT
TAPE CLOTH SOFT 2X10 (GAUZE/BANDAGES/DRESSINGS) IMPLANT
TRAY FOLEY SLVR 16FR LF STAT (SET/KITS/TRAYS/PACK) ×1 IMPLANT

## 2023-10-13 NOTE — Progress Notes (Signed)
 PHARMACY - ANTICOAGULATION CONSULT NOTE  Pharmacy Consult for heparin  Indication: atrial fibrillation  Allergies  Allergen Reactions   Amlodipine Swelling   Clonidine Rash    Rash with patch only.  Okay to take pill    Patient Measurements: Height: 5\' 4"  (162.6 cm) Weight: 85.3 kg (188 lb 0.8 oz) IBW/kg (Calculated) : 54.7 HEPARIN  DW (KG): 73.5  Vital Signs: Temp: 97.6 F (36.4 C) (05/15 0400) Temp Source: Oral (05/15 0400) BP: 136/68 (05/15 0700) Pulse Rate: 69 (05/15 0700)  Labs: Recent Labs    10/10/23 1818 10/11/23 0526 10/11/23 0526 10/12/23 0427 10/13/23 0431  HGB  --  7.9*   < > 7.9* 7.6*  HCT  --  24.1*  --  24.0* 23.1*  PLT  --  190  --  178 163  APTT 54* 92*  --   --   --   HEPARINUNFRC  --  0.65  --  0.36  --   CREATININE  --  1.74*  --   --   --    < > = values in this interval not displayed.    Estimated Creatinine Clearance: 28.1 mL/min (A) (by C-G formula based on SCr of 1.74 mg/dL (H)).   Medical History: Past Medical History:  Diagnosis Date   Diverticulosis    HTN (hypertension)    Hypothyroidism    Assessment: Pharmacy consulted to dose heparin  in patient with atrial fibrillation. Patient is on Eliquis  prior to admission with last dose 5/8 @ 2030.  Heparin  stopped at midnight for surgery today 5/15. No bleeding issues noted, hemoglobin down slightly to 7.6 this morning.   Goal of Therapy:  Heparin  level 0.3-0.7 units/ml aPTT 66-102 seconds Monitor platelets by anticoagulation protocol: Yes   Plan:  Follow up after procedure 5/15  Audra Blend PharmD., BCPS Clinical Pharmacist 10/13/2023 7:46 AM

## 2023-10-13 NOTE — Progress Notes (Signed)
 Ostomy leaking bloody drainage from side of dressing, reinforced with microfoam tape.

## 2023-10-13 NOTE — Progress Notes (Signed)
 Called patients daughter, Idamae Maize, with patients permission to update her.  Patients vital signs remain stable but she is still cool and we are working on getting her body temperature up with the bair hugger warming system.  Patients daughter voiced understanding and thanked me for calling her.

## 2023-10-13 NOTE — Progress Notes (Signed)
 Patient resting.  Trying to get Temperature.  Warming air and warm blankets.

## 2023-10-13 NOTE — Op Note (Addendum)
 Patient:  Felicia Frank  DOB:  [birth year]  MRN:  [id number]   Preop Diagnosis: Perforated sigmoid diverticulitis with coloenteral fistula  Postop Diagnosis: Same  Procedure: Partial colectomy with end colostomy (Hartman's procedure), ileocecectomy, partial small bowel resection  Surgeon: [name], MD  Assistant:  Alvester Johnson, DO  Anes: General Endotracheal  Indications: Patient is a 78 year old white female recently diagnosed with distal sigmoid colon diverticulitis with a fistula to the small intestine, status post percutaneous drainage of the pelvic abscess who failed medical therapy and now presents for a partial colectomy with possible colostomy, partial small bowel resection.  The risks and benefits of the procedure including bleeding, infection, cardiopulmonary difficulties, the possibility of a blood transfusion, and the possibility of a colostomy were fully explained to the patient, who gave informed consent.  Procedure note: The patient was placed in the supine position.  After induction of general endotracheal anesthesia, the abdomen was prepped and draped using the usual sterile technique with ChloraPrep.  Surgical site confirmation was performed.  Midline incision was made from the umbilicus to the suprapubic region.  The peritoneal cavity was entered into without difficulty.  Upon initial exploration, multiple loops of small bowel were noted to be adherent Calot in the pelvis at the colorectal juncture.  This was along the right side of the colon.  Initially I mobilized the sigmoid colon along the peritoneal reflection down distally.  I was able to identify the left ureter.  I then attempted to free up the pelvis of small bowel.  2 loops were noted to be acutely adherent to the colorectal juncture.  An abscess was found.  Aerobic and anaerobic cultures were taken and sent to microbiology.  There was free pus within the pelvis along with the perforated area of the  colorectal juncture.  I was able to free up both a proximal small bowel limb but also there was terminal ileum that was also closely adherent to the inflamed region.  I was able to then run the bowel from the ligament of Treitz to the terminal ileum.  Initially, the small bowel that was acutely adherent to the colorectal juncture which contained the fistula had to be resected.  An approximate 12 cm segment of small bowel in the midportion was resected.  A GIA stapler was placed proximally and distally across the small bowel and fired.  Mesentery was divided using the LigaSure.  The specimen was sent to pathology for further examination.  A side-to-side enteroenterostomy was done using a GIA 75 stapler.  The enterotomy was closed using a TA 60 stapler.  The staple line was bolstered using 3-0 silk Lembert sutures.  The mesenteric defect was closed using a 2-0 Chromic Gut running suture.  The affected area of the terminal ileum was too close to the cecum, thus it was elected to proceed with an ileocecectomy.  The right colon was mobilized along its peritoneal reflection.  A GIA stapler was placed across the distal ileum and fired.  This was likewise done just above the cecum.  The mesentery was divided using a LigaSure.  The specimen was sent to pathology for further examination.  A side-to-side ileocolic anastomosis was performed using a GIA 75 stapler.  The colotomy was closed using a TA 60 stapler.  The staple line was bolstered using 3-0 silk Lembert sutures.  I then turned my attention to the colorectal juncture.  I was able to mobilize the segment just below the colorectal juncture and above the peritoneal reflection.  A contour TA stapler was placed across the rectum and fired.  The mesentery of that was then divided back up to the midportion of the sigmoid colon using a LigaSure.  Care was taken to avoid the ureters.  A GIA 75 stapler was then placed across the mid sigmoid colon and fired.  A suture was placed  proximally for orientation purposes.  The specimen was sent to pathology for further examination.  An end colostomy was then brought out just to the left of the umbilicus using a cruciate incision.  The pelvis was inspected and copiously irrigated with normal saline.  I left approximately 3 cm of the rectum above the peritoneal reflection.  A bleeding was controlled using Bovie electrocautery.  Surgicel powder was placed in the pelvis.  #10 flat Jackson-Pratt drain was placed in the pelvis and brought through separate stab wound to the right of the incision.  It was secured at the skin level using a 3-0 nylon interrupted suture.  The bowel was returned into the abdominal cavity in an orderly fashion.  All operating personnel then changed their gloves.  Both anastomoses were then again inspected and noted to be patent.  All the small bowel was then inspected and no other injuries were noted.  The bowel was returned into the abdominal cavity in an orderly fashion.  The fascia was reapproximated using a looped 0 PDS running suture.  The subcutaneous layer was irrigated with normal saline.  0.5% Sensorcaine  was instilled into the surrounding wound.  The skin was closed using staples.  Betadine  ointment and dry sterile dressing were applied.  The left side colostomy was matured using 3-0 Chromic Gut interrupted sutures.  An ostomy bag was applied.  All tape and needle counts were correct at the end of the procedure.  The patient did receive 2 units of packed red blood cells during the surgery.  She was extubated at the end of the procedure but went back to ICU in guarded but stable condition.  Complications: None  EBL: 500 cc  Specimen: Small bowel, terminal ileum and cecum, distal sigmoid colon and rectum (suture proximal)  Culture of pelvic abscess  Addendum:  Dr. Cherilyn Corn was present for the whole procedure, especially of the critical parts of the bowel resection and colostomy formation as no other  assistance was available.

## 2023-10-13 NOTE — Interval H&P Note (Signed)
 History and Physical Interval Note:  10/13/2023 1:01 PM  Felicia Frank  has presented today for surgery, with the diagnosis of sigmoid diverticulitis.  The various methods of treatment have been discussed with the patient and family. After consideration of risks, benefits and other options for treatment, the patient has consented to  Procedure(s): COLECTOMY, WITH COLOSTOMY CREATION (N/A) as a surgical intervention.  The patient's history has been reviewed, patient examined, no change in status, stable for surgery.  I have reviewed the patient's chart and labs.  Questions were answered to the patient's satisfaction.     [name]

## 2023-10-13 NOTE — Progress Notes (Signed)
 PROGRESS NOTE    Felicia Frank  XBM:[id number] DOB: Oct 04, 1945 DOA: 10/07/2023 PCP: Allana Ishikawa, MD   Brief Narrative:    Felicia Frank is a 78 y.o. female with medical history significant of hypothyroidism, hypertension, chronic kidney disease stage IIIa, class I obesity, atrial fibrillation and recent hospitalization secondary to diverticulitis with perforation and contained abscess status post drain placement and enteral fistula formation; who presented to the hospital secondary to still ongoing abdominal pain, associated nausea/intermittent vomiting and difficulty keeping things down.  Patient has been admitted with diverticulitis of the colon with perforation and enterocolonic fistula.  General surgery planning for surgical repair 5/15.  Assessment & Plan:   Principal Problem:   Diverticulitis of colon with perforation  Assessment and Plan:   1-diverticulitis of the colon with perforation and enterocolonic fistula -Continue current IV antibiotics - Continue as needed analgesics and antiemetics - Following general surgery recommendations plan will be for bowel prep as tolerated and intention to proceed with surgical repair on 5/15 given water situation in the hospital. - No fever appreciated and patient's WBCs now within normal limits.   2-chronic paroxysmal atrial fibrillation with RVR currently in sinus rhythm - Will continue the use of adjusted dose IV metoprolol  and SR amiodarone  drip with loading dose - Hold heparin  infusion for anticipated surgical repair today   3-essential hypertension - Blood pressures currently soft - Continue adjusted antihypertensive agents and follow vital signs.   4-hypothyroidism - Continue Synthroid . - If unable to take p.o.'s we will transition to IV route (half of current Synthroid  dose when given IV).   5-acute kidney injury in the setting of chronic kidney disease stage IIIa - Creatinine improving with fluid resuscitation and  currently down to 1.4 from 1.9 at time of admission - Will continue to maintain adequate hydration and follow renal function trend - Continue to minimize nephrotoxic agents. - Now appears volume overloaded, avoid further IV fluids and plan for diuresis status post procedure   6-hypomagnesemia -Replete and reevaluate in a.m.   7-GERD - Continue PPI   8-compression fracture of L1 - Continue as needed analgesia - Will recommend continued use of TLSO brace at discharge.   9-class I obesity -Body mass index is 32.28 kg/m. - Low-calorie diet and portion control discussed with patient.    DVT prophylaxis: Heparin  drip currently held Code Status: Full Family Communication: Granddaughter at bedside 5/15 Disposition Plan:  Status is: Inpatient Remains inpatient appropriate because: Need for IV medications and surgical procedure.  Consultants:  General Surgery  Procedures:  None  Antimicrobials:  Anti-infectives (From admission, onward)    Start     Dose/Rate Route Frequency Ordered Stop   10/13/23 1200  cefoTEtan (CEFOTAN) 2 g in sodium chloride  0.9 % 100 mL IVPB        2 g 200 mL/hr over 30 Minutes Intravenous On call to O.R. 10/12/23 0836 10/14/23 0559   10/12/23 1300  metroNIDAZOLE  (FLAGYL ) tablet 500 mg        500 mg Oral 3 times daily 10/11/23 1212 10/12/23 2116   10/12/23 1300  neomycin (MYCIFRADIN) tablet 1,000 mg        1,000 mg Oral 3 times daily 10/11/23 1212 10/12/23 2136   10/11/23 1300  neomycin (MYCIFRADIN) tablet 1,000 mg  Status:  Discontinued        1,000 mg Oral 3 times daily 10/11/23 0839 10/11/23 1212   10/11/23 1300  metroNIDAZOLE  (FLAGYL ) tablet 500 mg  Status:  Discontinued  500 mg Oral 3 times daily 10/11/23 0839 10/11/23 1212   10/07/23 1500  piperacillin -tazobactam (ZOSYN ) IVPB 3.375 g        3.375 g 12.5 mL/hr over 240 Minutes Intravenous Every 8 hours 10/07/23 1356         Subjective: Patient seen and evaluated today with no new acute  complaints or concerns. No acute concerns or events noted overnight.  She is eager to have her operation today and does complain of some mild swelling to her upper and lower extremities.  Objective: Vitals:   10/13/23 0600 10/13/23 0700 10/13/23 0804 10/13/23 0900  BP: (!) 133/44 136/68  (!) 134/46  Pulse: 69 69  80  Resp: 10 14  19   Temp:   (!) 97.3 F (36.3 C)   TempSrc:   Axillary   SpO2: 98% 97%  98%  Weight:      Height:        Intake/Output Summary (Last 24 hours) at 10/13/2023 1101 Last data filed at 10/13/2023 1610 Gross per 24 hour  Intake 1670.66 ml  Output --  Net 1670.66 ml   Filed Weights   10/07/23 0911  Weight: 85.3 kg    Examination:  General exam: Appears calm and comfortable  Respiratory system: Clear to auscultation. Respiratory effort normal. Cardiovascular system: S1 & S2 heard, RRR.  Gastrointestinal system: Abdomen is soft Central nervous system: Alert and awake Extremities: Diffuse edema to upper and lower extremities noted, nonpitting Skin: No significant lesions noted Psychiatry: Flat affect.    Data Reviewed: I have personally reviewed following labs and imaging studies  CBC: Recent Labs  Lab 10/07/23 0919 10/08/23 0356 10/09/23 0440 10/10/23 0701 10/11/23 0526 10/12/23 0427 10/13/23 0431  WBC 11.2*   < > 10.8* 7.2 5.6 4.5 4.2  NEUTROABS 9.8*  --   --   --   --   --   --   HGB 10.6*   < > 8.7* 7.9* 7.9* 7.9* 7.6*  HCT 33.5*   < > 27.6* 24.5* 24.1* 24.0* 23.1*  MCV 96.5   < > 98.9 97.6 94.5 94.9 94.7  PLT 304   < > 224 206 190 178 163   < > = values in this interval not displayed.   Basic Metabolic Panel: Recent Labs  Lab 10/07/23 0919 10/08/23 0356 10/09/23 0440 10/10/23 0701 10/11/23 0526 10/13/23 0431  NA 134* 137 135 132* 137 136  K 2.9* 3.8 3.6 2.9* 4.0 3.5  CL 98 104 106 101 109 106  CO2 22 24 22  21* 23 22  GLUCOSE 128* 113* 88 156* 79 70  BUN 17 18 25* 20 17 14   CREATININE 1.91* 1.60* 2.00* 1.63* 1.74* 1.45*   CALCIUM 8.2* 7.8* 7.7* 7.7* 8.0* 7.8*  MG 1.6*  --  1.9  --  1.8 1.6*  PHOS  --   --  2.8  --   --   --    GFR: Estimated Creatinine Clearance: 33.8 mL/min (A) (by C-G formula based on SCr of 1.45 mg/dL (H)). Liver Function Tests: Recent Labs  Lab 10/07/23 0919  AST 22  ALT 14  ALKPHOS 53  BILITOT 0.7  PROT 5.6*  ALBUMIN 2.7*   Recent Labs  Lab 10/07/23 0919  LIPASE 32   No results for input(s): "AMMONIA" in the last 168 hours. Coagulation Profile: No results for input(s): "INR", "PROTIME" in the last 168 hours. Cardiac Enzymes: No results for input(s): "CKTOTAL", "CKMB", "CKMBINDEX", "TROPONINI" in the last 168 hours. BNP (last  3 results) No results for input(s): "PROBNP" in the last 8760 hours. HbA1C: No results for input(s): "HGBA1C" in the last 72 hours. CBG: No results for input(s): "GLUCAP" in the last 168 hours. Lipid Profile: No results for input(s): "CHOL", "HDL", "LDLCALC", "TRIG", "CHOLHDL", "LDLDIRECT" in the last 72 hours. Thyroid Function Tests: No results for input(s): "TSH", "T4TOTAL", "FREET4", "T3FREE", "THYROIDAB" in the last 72 hours. Anemia Panel: No results for input(s): "VITAMINB12", "FOLATE", "FERRITIN", "TIBC", "IRON", "RETICCTPCT" in the last 72 hours. Sepsis Labs: No results for input(s): "PROCALCITON", "LATICACIDVEN" in the last 168 hours.  No results found for this or any previous visit (from the past 240 hours).       Radiology Studies: No results found.      Scheduled Meds:  Chlorhexidine  Gluconate Cloth  6 each Topical Daily   doxazosin   4 mg Oral QHS   levothyroxine   50 mcg Oral Daily   metoprolol  tartrate  2.5 mg Intravenous Q12H   sodium chloride  flush  10-40 mL Intracatheter Q12H   Continuous Infusions:  amiodarone  30 mg/hr (10/13/23 0822)   cefoTEtan (CEFOTAN) IV     magnesium  sulfate bolus IVPB     piperacillin -tazobactam (ZOSYN )  IV 12.5 mL/hr at 10/13/23 0652     LOS: 6 days    Time spent: 55  minutes    Felicia Frank D Mason Sole, DO [facility]  If 7PM-7AM, please contact night-coverage [email or web address] 10/13/2023, 11:01 AM

## 2023-10-13 NOTE — Anesthesia Procedure Notes (Signed)
 Procedure Name: Intubation Date/Time: 10/13/2023 1:26 PM  Performed by: Coretha Dew, MDPre-anesthesia Checklist: Patient identified, Suction available, Emergency Drugs available, Patient being monitored and Timeout performed Patient Re-evaluated:Patient Re-evaluated prior to induction Oxygen Delivery Method: Circle system utilized Preoxygenation: Pre-oxygenation with 100% oxygen Induction Type: IV induction, Rapid sequence and Cricoid Pressure applied Ventilation: Mask ventilation without difficulty Laryngoscope Size: Miller and 2 Grade View: Grade II Tube type: Oral Tube size: 7.0 mm Number of attempts: 2 (1st attempt grade 2 B with Miller 2, ETT placed in esophagus. ETT was removed OPA inserted and mask ventilated. 2nd attempt miller 2 with grade 2A with cricoid pressure) Airway Equipment and Method: Oral airway and Stylet Placement Confirmation: ETT inserted through vocal cords under direct vision, positive ETCO2 and breath sounds checked- equal and bilateral Secured at: 21 cm Tube secured with: Tape Dental Injury: Teeth and Oropharynx as per pre-operative assessment

## 2023-10-13 NOTE — Anesthesia Preprocedure Evaluation (Signed)
 Anesthesia Evaluation  Patient identified by MRN, date of birth, ID band Patient awake    Reviewed: Allergy & Precautions, H&P , NPO status , Patient's Chart, lab work & pertinent test results, reviewed documented beta blocker date and time   Airway Mallampati: II  TM Distance: >3 FB Neck ROM: full    Dental no notable dental hx.    Pulmonary neg pulmonary ROS   Pulmonary exam normal breath sounds clear to auscultation       Cardiovascular Exercise Tolerance: Good hypertension,  Rhythm:regular Rate:Normal     Neuro/Psych negative neurological ROS  negative psych ROS   GI/Hepatic Neg liver ROS,GERD  ,,  Endo/Other  Hypothyroidism    Renal/GU Renal disease  negative genitourinary   Musculoskeletal   Abdominal   Peds  Hematology  (+) Blood dyscrasia, anemia   Anesthesia Other Findings   Reproductive/Obstetrics negative OB ROS                             Anesthesia Physical Anesthesia Plan  ASA: 3  Anesthesia Plan: General and General ETT   Post-op Pain Management:    Induction:   PONV Risk Score and Plan: Ondansetron   Airway Management Planned:   Additional Equipment:   Intra-op Plan:   Post-operative Plan:   Informed Consent: I have reviewed the patients History and Physical, chart, labs and discussed the procedure including the risks, benefits and alternatives for the proposed anesthesia with the patient or authorized representative who has indicated his/her understanding and acceptance.     Dental Advisory Given  Plan Discussed with: CRNA  Anesthesia Plan Comments:        Anesthesia Quick Evaluation

## 2023-10-13 NOTE — Transfer of Care (Signed)
 Immediate Anesthesia Transfer of Care Note  Patient: Felicia Frank  Procedure(s) Performed: PARTIAL COLECTOMY, WITH COLOSTOMY CREATION, PARTIAL SMALL BOWEL RESECTION, ILEOCECECTOMY (Abdomen)  Patient Location: PACU  Anesthesia Type:General  Level of Consciousness: drowsy and patient cooperative  Airway & Oxygen Therapy: Patient Spontanous Breathing and Patient connected to face mask oxygen  Post-op Assessment: Report given to RN and Post -op Vital signs reviewed and stable  Post vital signs: Reviewed and stable  Last Vitals:  Vitals Value Taken Time  BP 115/53 10/13/23 1630  Temp    Pulse 69 10/13/23 1633  Resp 21 10/13/23 1633  SpO2 100 % 10/13/23 1633  Vitals shown include unfiled device data.  Last Pain:  Vitals:   10/13/23 1159  TempSrc: Oral  PainSc: 3       Patients Stated Pain Goal: 4 (10/13/23 1159)  Complications: No notable events documented.

## 2023-10-13 NOTE — Plan of Care (Signed)

## 2023-10-13 NOTE — Progress Notes (Signed)
 Spoke to [employer] nurse, for assistance with transporting patient to ICU.  She said she would be about 10 minutes as she was starting an IV.

## 2023-10-14 ENCOUNTER — Encounter (HOSPITAL_COMMUNITY): Payer: Self-pay | Admitting: General Surgery

## 2023-10-14 DIAGNOSIS — K5720 Diverticulitis of large intestine with perforation and abscess without bleeding: Secondary | ICD-10-CM | POA: Diagnosis not present

## 2023-10-14 LAB — TYPE AND SCREEN
Unit division: 0
Unit division: 0

## 2023-10-14 LAB — MAGNESIUM: Magnesium: 1.6 mg/dL — ABNORMAL LOW (ref 1.7–2.4)

## 2023-10-14 LAB — BPAM RBC
Blood Product Expiration Date: 202505272359
Blood Product Expiration Date: 202505272359
ISSUE DATE / TIME: 202505151440
ISSUE DATE / TIME: 202505151440
Unit Type and Rh: 6200
Unit Type and Rh: 6200

## 2023-10-14 LAB — COMPREHENSIVE METABOLIC PANEL WITH GFR
ALT: 14 U/L (ref 0–44)
AST: 18 U/L (ref 15–41)
Albumin: 1.9 g/dL — ABNORMAL LOW (ref 3.5–5.0)
Alkaline Phosphatase: 29 U/L — ABNORMAL LOW (ref 38–126)
Anion gap: 6 (ref 5–15)
BUN: 16 mg/dL (ref 8–23)
CO2: 22 mmol/L (ref 22–32)
Calcium: 7.7 mg/dL — ABNORMAL LOW (ref 8.9–10.3)
Chloride: 108 mmol/L (ref 98–111)
Creatinine, Ser: 1.4 mg/dL — ABNORMAL HIGH (ref 0.44–1.00)
GFR, Estimated: 39 mL/min — ABNORMAL LOW (ref >60)
Glucose, Bld: 144 mg/dL — ABNORMAL HIGH (ref 70–99)
Potassium: 4 mmol/L (ref 3.5–5.1)
Sodium: 136 mmol/L (ref 135–145)
Total Bilirubin: 0.5 mg/dL (ref 0.0–1.2)
Total Protein: 3.8 g/dL — ABNORMAL LOW (ref 6.5–8.1)

## 2023-10-14 LAB — PHOSPHORUS: Phosphorus: 3.1 mg/dL (ref 2.5–4.6)

## 2023-10-14 LAB — CBC
HCT: 26.7 % — ABNORMAL LOW (ref 36.0–46.0)
Hemoglobin: 8.9 g/dL — ABNORMAL LOW (ref 12.0–15.0)
MCH: 29.5 pg (ref 26.0–34.0)
MCHC: 33.3 g/dL (ref 30.0–36.0)
MCV: 88.4 fL (ref 80.0–100.0)
Platelets: 153 10*3/uL (ref 150–400)
RBC: 3.02 MIL/uL — ABNORMAL LOW (ref 3.87–5.11)
RDW: 17.8 % — ABNORMAL HIGH (ref 11.5–15.5)
WBC: 18.1 10*3/uL — ABNORMAL HIGH (ref 4.0–10.5)
nRBC: 0 % (ref 0.0–0.2)

## 2023-10-14 MED ORDER — ALBUMIN HUMAN 25 % IV SOLN
50.0000 g | Freq: Once | INTRAVENOUS | Status: AC
  Administered 2023-10-14: 50 g via INTRAVENOUS
  Filled 2023-10-14: qty 200

## 2023-10-14 MED ORDER — TRAVASOL 10 % IV SOLN: INTRAVENOUS | Status: DC

## 2023-10-14 MED ORDER — ACETAMINOPHEN 500 MG PO TABS
1000.0000 mg | ORAL_TABLET | Freq: Four times a day (QID) | ORAL | Status: DC
  Administered 2023-10-14 – 2023-10-27 (×43): 1000 mg via ORAL
  Filled 2023-10-14 (×46): qty 2

## 2023-10-14 MED ORDER — MAGNESIUM SULFATE 2 GM/50ML IV SOLN
2.0000 g | Freq: Once | INTRAVENOUS | Status: AC
  Administered 2023-10-14: 2 g via INTRAVENOUS
  Filled 2023-10-14: qty 50

## 2023-10-14 MED ORDER — INSULIN ASPART 100 UNIT/ML IJ SOLN
0.0000 [IU] | Freq: Three times a day (TID) | INTRAMUSCULAR | Status: DC
  Administered 2023-10-15 – 2023-10-18 (×9): 2 [IU] via SUBCUTANEOUS
  Administered 2023-10-18: 3 [IU] via SUBCUTANEOUS
  Administered 2023-10-19: 2 [IU] via SUBCUTANEOUS

## 2023-10-14 MED ORDER — OXYCODONE HCL 5 MG PO TABS
5.0000 mg | ORAL_TABLET | ORAL | Status: DC | PRN
  Administered 2023-10-14 – 2023-10-25 (×9): 5 mg via ORAL
  Filled 2023-10-14 (×11): qty 1

## 2023-10-14 MED ORDER — TRAVASOL 10 % IV SOLN
INTRAVENOUS | Status: AC
  Filled 2023-10-14: qty 432

## 2023-10-14 MED ORDER — FUROSEMIDE 10 MG/ML IJ SOLN
20.0000 mg | Freq: Two times a day (BID) | INTRAMUSCULAR | Status: DC
  Administered 2023-10-14 – 2023-10-17 (×7): 20 mg via INTRAVENOUS
  Filled 2023-10-14 (×7): qty 2

## 2023-10-14 NOTE — Care Management Important Message (Signed)
 Important Message  Patient Details  Name: Felicia Frank MRN: [id number] Date of Birth: [birth year]   Important Message Given:  Yes - Medicare IM     Abagayle Klutts L Zauria Dombek 10/14/2023, 3:40 PM

## 2023-10-14 NOTE — Evaluation (Signed)
 Physical Therapy Evaluation Patient Details Name: Felicia Frank MRN: [id number] DOB: Jan 03, 1946 Today's Date: 10/14/2023  History of Present Illness  Felicia Frank is a 78 y.o. 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     Workup in the ED demonstrating elevated WBCs and abnormal CT scan with concern for enteral fistula formation, contained abscess and diverticulitis.  Unfortunately given location of this new contained abscess no ability or safe margin for placement percutaneous drain.     Case was discussed with general surgery who recommended admission for IV antibiotics and anticipated surgical intervention on 10/10/2023.   Clinical Impression  Patient demonstrates slow labored movement for sitting up at bedside with c/o mild increase in abdominal pain, increased time for scooting to EOB and limited to a few steps forward/backwards at bedside before having to sit due to fatigue and generalized weakness. Patient tolerated sitting up in chair after therapy with spouse present. Patient will benefit from continued skilled physical therapy in hospital and recommended venue below to increase strength, balance, endurance for safe ADLs and gait.           If plan is discharge home, recommend the following: A lot of help with bathing/dressing/bathroom;A lot of help with walking and/or transfers;Help with stairs or ramp for entrance;Assistance with cooking/housework   Can travel by  private vehicle   No    Equipment Recommendations None recommended by PT  Recommendations for Other Services       Functional Status Assessment Patient has had a recent decline in their functional status and demonstrates the ability to make significant improvements in function in a reasonable and predictable amount of time.     Precautions / Restrictions Precautions Precautions: Fall Restrictions Weight Bearing Restrictions Per Provider Order: No      Mobility  Bed Mobility Overal bed mobility: Needs Assistance Bed Mobility: Supine to Sit     Supine to sit: Mod assist     General bed mobility comments: increased time with labored movement and mild increase in abdominal pain    Transfers Overall transfer level: Needs assistance Equipment used: Rolling walker (2 wheels) Transfers: Sit to/from Stand, Bed to chair/wheelchair/BSC Sit to Stand: Min assist, Mod assist   Step pivot transfers: Min assist, Mod assist       General transfer comment: unsteady labored movement    Ambulation/Gait Ambulation/Gait assistance: Mod assist, Min assist Gait Distance (Feet): 8 Feet Assistive device: Rolling walker (2 wheels) Gait Pattern/deviations: Decreased step length - right, Decreased step length - left, Decreased stride length Gait velocity: slow     General Gait Details: limited to a few slow labored side steps and steps forward/backwards before having to sit due to c/o fatigue, weakness  Stairs            Wheelchair Mobility     Tilt Bed    Modified Rankin (Stroke Patients Only)       Balance Overall balance assessment: Needs assistance Sitting-balance support: Feet supported, No upper extremity supported Sitting balance-Leahy Scale: Fair Sitting balance - Comments: fair/good seated at EOB   Standing balance support: During functional activity, Bilateral upper extremity supported,  Reliant on assistive device for balance Standing balance-Leahy Scale:  Poor Standing balance comment: fair/poor using RW                             Pertinent Vitals/Pain Pain Assessment Pain Assessment: Faces Faces Pain Scale: Hurts a little bit Pain Location: stomach at site of surgery Pain Descriptors / Indicators: Sore, Discomfort Pain Intervention(s): Limited activity within patient's tolerance, Monitored during session, Repositioned    Home Living Family/patient expects to be discharged to:: Private residence Living Arrangements: Spouse/significant other Available Help at Discharge: Family;Available PRN/intermittently Type of Home: House Home Access: Stairs to enter Entrance Stairs-Rails: None Entrance Stairs-Number of Steps: 1   Home Layout: One level;Laundry or work area in basement;Able to live on main level with bedroom/bathroom;Full bath on main level;Other (Comment) Home Equipment: Rolling Walker (2 wheels)      Prior Function Prior Level of Function : Independent/Modified Independent;Working/employed;Driving             Mobility Comments: Community ambulation without AD, drives, shops ADLs Comments: Independent     Extremity/Trunk Assessment   Upper Extremity Assessment Upper Extremity Assessment: Generalized weakness    Lower Extremity Assessment Lower Extremity Assessment: Generalized weakness    Cervical / Trunk Assessment Cervical / Trunk Assessment: Normal  Communication   Communication Communication: No apparent difficulties    Cognition Arousal: Alert Behavior During Therapy: WFL for tasks assessed/performed   PT - Cognitive impairments: No apparent impairments                         Following commands: Intact       Cueing Cueing Techniques: Verbal cues     General Comments      Exercises     Assessment/Plan    PT Assessment Patient needs continued PT services  PT Problem List Decreased strength;Decreased activity tolerance;Decreased balance;Decreased mobility        PT Treatment Interventions DME instruction;Gait training;Stair training;Functional mobility training;Therapeutic activities;Therapeutic exercise;Balance training;Patient/family education    PT Goals (Current goals can be found in the Care Plan section)  Acute Rehab PT Goals Patient Stated Goal: return home after rehab PT Goal Formulation: With patient/family Time For Goal Achievement: 10/28/23 Potential to Achieve Goals: Good    Frequency Min 3X/week     Co-evaluation               AM-PAC PT "6 Clicks" Mobility  Outcome Measure Help needed turning from your back to your side while in a flat bed without using bedrails?: A Lot Help needed moving from lying on your back to sitting on the side of a flat bed without using bedrails?: A Lot Help needed moving to and from a bed to a chair (including a wheelchair)?: A Lot Help needed standing up from a chair using your arms (e.g., wheelchair or bedside chair)?: A Little Help needed to walk in hospital room?: A Lot Help needed climbing 3-5 steps with a railing? : A Lot 6 Click Score: 13    End of Session   Activity Tolerance: Patient tolerated treatment well;Patient limited by fatigue Patient left: in chair;with call bell/phone within reach;with family/visitor present Nurse Communication: Mobility status PT Visit Diagnosis: Unsteadiness on feet (R26.81);Other abnormalities of gait and mobility (R26.89);Muscle weakness (generalized) (M62.81)    Time: 1020-1050 PT Time Calculation (min) (ACUTE ONLY): 30 min   Charges:   PT Evaluation $PT Eval Moderate Complexity: 1 Mod  PT Treatments $Therapeutic Activity: 23-37 mins PT General Charges $$ ACUTE PT VISIT: 1 Visit         2:46 PM, 10/14/23 Walton Guppy, MPT Physical Therapist with [facility] 336 [phone] office [phone] mobile phone

## 2023-10-14 NOTE — Progress Notes (Addendum)
 Initial Nutrition Assessment  DOCUMENTATION CODES:   Not applicable  INTERVENTION:   TPN to meet 100% of estimated nutrition needs until patient is tolerating solid foods with good intake.  Monitor magnesium , potassium, and phosphorus BID for at least 3 days, MD to replete as needed, as pt is at risk for refeeding syndrome given minimal intake x 7 days since admission. Recommend add Thiamine 100 mg daily for 7 days  NUTRITION DIAGNOSIS:   Increased nutrient needs related to post-op healing as evidenced by estimated needs.  GOAL:   Patient will meet greater than or equal to 90% of their needs  MONITOR:   I & O's, Diet advancement  REASON FOR ASSESSMENT:   Consult New TPN/TNA  ASSESSMENT:   78 yo female admitted with diverticulitis of colon with perforation. PMH includes HTN, hypothyroidism, diverticulosis.  5/9 - 5/14 patient was on full liquids, minimal intake. 5/14: changed to clear liquids. 5/15: NPO for surgery. 5/15: S/P partial colectomy with colostomy creation, partial small bowel resection, ileocecectomy.    TPN being initiated today at 40 ml/h to provide 941 kcal and 43 gm protein daily.   Patient is at risk for refeeding syndrome d/t minimal intake x 7 days since admission.   Labs reviewed. Mag 1.6  Medications reviewed and include lasix, novolog, protonix. Received mag sulfate 2g IV this morning for repletion.   Weight history reviewed. No significant weight loss noted. Current weight (96.8 kg) is above admit weight (85.3 kg) likely r/t edema. Patient with very deep pitting edema to BLE, moderate pitting edema to BUE, and generalized moderate pitting edema per RN assessment.   NG tube in place with 20 ml output documented x 24 hours. JP drain in abdomen with 155 ml output x 24 hours.   NUTRITION - FOCUSED PHYSICAL EXAM:  Unable to complete  Diet Order:   Diet Order     None       EDUCATION NEEDS:   No education needs have been identified at  this time  Skin:  Skin Assessment: Skin Integrity Issues: Skin Integrity Issues:: Other (Comment), Incisions Incisions: abdomen Other: pin hole sized red open area to R buttock  Last BM:  5/15  Height:   Ht Readings from Last 1 Encounters:  10/13/23 5\' 4"  (1.626 m)    Weight:   Wt Readings from Last 1 Encounters:  10/14/23 96.8 kg    Ideal Body Weight:  54.5 kg  BMI:  Body mass index is 36.63 kg/m.  Estimated Nutritional Needs:   Kcal:  1700-1900  Protein:  90-110 gm  Fluid:  1.7-1.9 L   Barnet Boots RD, LDN, CNSC Contact via secure chat. If unavailable, use group chat "RD Inpatient."

## 2023-10-14 NOTE — Consult Note (Signed)
 WOC Nurse ostomy consult note Stoma type/location: LLQ, colostomy Stomal assessment/size: 1 3/4" round, budded, pink, moist Peristomal assessment: NA Treatment options for stomal/peristomal skin: will need 2" barrier ring Output bloody Ostomy pouching: 2pc.  Education provided:  Patient up in chair, brother is at bedside and leaves the room when I assessed her pouch.  She is extremely edematous and requires full care to even take ice chips. She still has NG tube.  Feel that 2pc will work for her. Left educational materials in the room. Supplies in the room if needed by staff. She is not engaged today to learn. She is planning/hoping to go to rehab.  Explained creation of stoma and role of WOC nursing. She lives with husband but he is disabled and will not be able to assist her in any of her care.   Enrolled patient in [place] [employer] DC program: Yes  WOC Nurse will follow along with you for continued support with ostomy teaching and care Alphonse Asbridge [facility] MSN, RN, [place], CNS, [place] [phone]

## 2023-10-14 NOTE — TOC Progression Note (Signed)
 Transition of Care [facility]) - Progression Note    Patient Details  Name: Felicia Frank MRN: [id number] Date of Birth: [birth year]  Transition of Care [facility]) CM/SW Contact  [place]ville Lax, [place] Phone Number: 10/14/2023, 11:50 AM  Clinical Narrative:    CSW updated that PT is recommending SNF for pt at D/C. CSW met with pt and brother at bedside to review recommendation. Pt states she is agreeable to SNF referral being sent to [facility]. CSW to complete referral and send out for review. TOC to follow.   Expected Discharge Plan: Home w Home Health Services Barriers to Discharge: Continued Medical Work up  Expected Discharge Plan and Services In-house Referral: Clinical Social Work Discharge Planning Services: CM Consult Post Acute Care Choice: Home Health Living arrangements for the past 2 months: Single Family Home                                       Social Determinants of Health (SDOH) Interventions SDOH Screenings   Food Insecurity: No Food Insecurity (10/08/2023)  Housing: Low Risk  (10/08/2023)  Transportation Needs: No Transportation Needs (10/08/2023)  Utilities: Not At Risk (10/08/2023)  Social Connections: Socially Integrated (10/08/2023)  Tobacco Use: Low Risk  (10/13/2023)    Readmission Risk Interventions    10/10/2023   11:26 AM 10/07/2023   10:06 PM 09/16/2023   10:04 PM  Readmission Risk Prevention Plan  Post Dischage Appt   Complete  Medication Screening   Complete  Transportation Screening Complete Complete Complete  PCP or Specialist Appt within 5-7 Days  Complete   Home Care Screening  Complete   Medication Review (RN CM)  Complete   HRI or Home Care Consult Complete    Social Work Consult for Recovery Care Planning/Counseling Complete    Palliative Care Screening Not Applicable    Medication Review Oceanographer) Complete

## 2023-10-14 NOTE — NC FL2 (Signed)
 [place]  MEDICAID FL2 LEVEL OF CARE FORM     IDENTIFICATION  Patient Name: Felicia Frank Birthdate: 07/19/1945 Sex: female Admission Date (Current Location): 10/07/2023  [facility] and [place] Number:  [employer] and Address:  [facility],  [address], [place] Frank [address]      Provider Number: [id number]  Attending Physician Name and Address:  Cornelius Dill, DO  Relative Name and Phone Number:       Current Level of Care: Hospital Recommended Level of Care: Skilled Nursing Facility Prior Approval Number:    Date Approved/Denied:   PASRR Number:    Discharge Plan: SNF    Current Diagnoses: Patient Active Problem List   Diagnosis Date Noted   Chronic anemia 09/26/2023   Obesity, 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   Atrial fibrillation, new onset (HCC) 09/18/2023   Compression fracture of L1 lumbar vertebra (HCC) 09/16/2023   Diverticulitis of colon with perforation 09/16/2023   Hyponatremia 09/16/2023   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   Normocytic anemia 10/07/2021   Constipation 10/07/2021   Dysphagia 10/07/2021   History of rectal bleeding 02/24/2021   Stage 3a chronic kidney disease (HCC) 06/09/2020   Gastroesophageal reflux disease 11/13/2019   Osteoarthritis 04/20/2019   Essential hypertension 04/20/2019   Hypothyroidism 04/20/2019    Orientation RESPIRATION BLADDER Height & Weight     Self, Time, Situation, Place  Normal Indwelling catheter Weight: 213 lb 6.5 oz (96.8 kg) Height:  5\' 4"  (162.6 cm)  BEHAVIORAL SYMPTOMS/MOOD NEUROLOGICAL BOWEL NUTRITION STATUS      Colostomy Diet (See D/C summary)  AMBULATORY STATUS COMMUNICATION OF NEEDS Skin   Extensive Assist Verbally Normal                       Personal Care Assistance Level of Assistance  Bathing, Feeding, Dressing Bathing Assistance: Limited assistance Feeding assistance: Independent Dressing Assistance: Limited assistance     Functional Limitations Info  Sight,  Hearing, Speech Sight Info: Impaired Hearing Info: Adequate Speech Info: Adequate    SPECIAL CARE FACTORS FREQUENCY  PT (By licensed PT), OT (By licensed OT)     PT Frequency: 5 times weekly OT Frequency: 5 times weekly            Contractures Contractures Info: Not present    Additional Factors Info  Code Status, Allergies Code Status Info: FULL Allergies Info: Amlodipine, Clonidine           Current Medications (10/14/2023):  This is the current hospital active medication list Current Facility-Administered Medications  Medication Dose Route Frequency Provider Last Rate Last Admin   0.9 %  sodium chloride  infusion (Manually program via Guardrails IV Fluids)   Intravenous Once [name], MD       acetaminophen (TYLENOL) tablet 1,000 mg  1,000 mg Oral Q6H Pappayliou, Catherine A, DO       amiodarone  (NEXTERONE  PREMIX) 360-4.14 MG/200ML-% (1.8 mg/mL) IV infusion  30 mg/hr Intravenous Continuous [name], MD 16.67 mL/hr at 10/14/23 1054 30 mg/hr at 10/14/23 1054   Chlorhexidine  Gluconate Cloth 2 % PADS 6 each  6 each Topical Daily [name], MD   6 each at 10/13/23 2010   furosemide (LASIX) injection 20 mg  20 mg Intravenous Q12H Shah, Pratik D, DO   20 mg at 10/14/23 0845   HYDROmorphone  (DILAUDID ) injection 1 mg  1 mg Intravenous Q3H PRN [name], MD   1 mg at 10/14/23 0845   [START ON 10/15/2023] insulin aspart (  novoLOG) injection 0-15 Units  0-15 Units Subcutaneous Q8H Madueme, Elvira C, RPH       levothyroxine  (SYNTHROID ) tablet 50 mcg  50 mcg Oral Daily [name], MD       metoprolol  tartrate (LOPRESSOR ) injection 2.5 mg  2.5 mg Intravenous Q12H [name], MD   2.5 mg at 10/14/23 1027   ondansetron  (ZOFRAN ) tablet 4 mg  4 mg Oral Q6H PRN [name], MD       Or   ondansetron  (ZOFRAN ) injection 4 mg  4 mg Intravenous Q6H PRN [name], MD   4 mg at 10/10/23 1453   oxyCODONE  (Oxy IR/ROXICODONE ) immediate release tablet 5 mg  5 mg Oral Q4H PRN  Pappayliou, Catherine A, DO       pantoprazole (PROTONIX) injection 40 mg  40 mg Intravenous QHS [name], MD   40 mg at 10/13/23 2210   piperacillin -tazobactam (ZOSYN ) IVPB 3.375 g  3.375 g Intravenous Q8H [name], MD 12.5 mL/hr at 10/14/23 0655 Infusion Verify at 10/14/23 0655   prochlorperazine  (COMPAZINE ) injection 10 mg  10 mg Intravenous Q6H PRN [name], MD   10 mg at 10/08/23 2250   sodium chloride  flush (NS) 0.9 % injection 10-40 mL  10-40 mL Intracatheter Q12H [name], MD   10 mL at 10/14/23 1027   sodium chloride  flush (NS) 0.9 % injection 10-40 mL  10-40 mL Intracatheter PRN [name], MD       TPN ADULT (ION)   Intravenous Continuous TPN Madueme, Elvira C, RPH         Discharge Medications: Please see discharge summary for a list of discharge medications.  Relevant Imaging Results:  Relevant Lab Results:   Additional Information SSN: [id number], LCSWA

## 2023-10-14 NOTE — Plan of Care (Signed)
  Problem: Acute Rehab PT Goals(only PT should resolve) Goal: Pt Will Go Supine/Side To Sit Outcome: Progressing Flowsheets (Taken 10/14/2023 1447) Pt will go Supine/Side to Sit: with minimal assist Goal: Patient Will Transfer Sit To/From Stand Outcome: Progressing Flowsheets (Taken 10/14/2023 1447) Patient will transfer sit to/from stand:  with contact guard assist  with minimal assist Goal: Pt Will Transfer Bed To Chair/Chair To Bed Outcome: Progressing Flowsheets (Taken 10/14/2023 1447) Pt will Transfer Bed to Chair/Chair to Bed:  with contact guard assist  with min assist Goal: Pt Will Ambulate Outcome: Progressing Flowsheets (Taken 10/14/2023 1447) Pt will Ambulate:  25 feet  with minimal assist  with rolling walker   2:48 PM, 10/14/23 Walton Guppy, MPT Physical Therapist with [facility] [phone] office [phone] mobile phone

## 2023-10-14 NOTE — Progress Notes (Signed)
 PROGRESS NOTE    Felicia Frank  VHQ:[id number] DOB: November 25, 1945 DOA: 10/07/2023 PCP: Allana Ishikawa, MD   Brief Narrative:    Felicia Frank is a 78 y.o. female with medical history significant of hypothyroidism, hypertension, chronic kidney disease stage IIIa, class I obesity, atrial fibrillation and recent hospitalization secondary to diverticulitis with perforation and contained abscess status post drain placement and enteral fistula formation; who presented to the hospital secondary to still ongoing abdominal pain, associated nausea/intermittent vomiting and difficulty keeping things down.  Patient has been admitted with diverticulitis of the colon with perforation and enterocolonic fistula.  She is status post Hartman's procedure with small bowel resection and ileocecectomy on 5/15.  Patient's to start TPN as well as Lasix.  Assessment & Plan:   Principal Problem:   Diverticulitis of colon with perforation Active Problems:   Fistula of large intestine  Assessment and Plan:   1-diverticulitis of the colon with perforation and enterocolonic fistula status post Hartman's procedure with small bowel resection and ileocecectomy 5/15. -Continue current IV antibiotics - Appreciate general surgery recommendations to continue n.p.o. status and start TPN -Plan to start Lasix for volume overload -Okay to clamp NG tube for medication administration -Continue Foley catheter for diuresis   2-chronic paroxysmal atrial fibrillation with RVR currently in sinus rhythm - Will continue the use of adjusted dose IV metoprolol  and SR amiodarone  drip with loading dose - Hold heparin  infusion for now per general surgery   3-essential hypertension - Blood pressures currently soft - Continue adjusted antihypertensive agents and follow vital signs.   4-hypothyroidism - Continue Synthroid . - Taking p.o.   5-acute kidney injury in the setting of chronic kidney disease stage IIIa - Creatinine  improving with fluid resuscitation and currently down to 1.4 from 1.9 at time of admission - Will continue to maintain adequate hydration and follow renal function trend - Continue to minimize nephrotoxic agents. - Now appears volume overloaded, avoid further IV fluids and plan for diuresis status post procedure   6-hypomagnesemia -Replete and reevaluate in a.m.   7-GERD - Continue PPI   8-compression fracture of L1 - Continue as needed analgesia - Will recommend continued use of TLSO brace at discharge.   9-class I obesity -Body mass index is 32.28 kg/m. - Low-calorie diet and portion control discussed with patient.    DVT prophylaxis: Heparin  drip currently held Code Status: Full Family Communication: Brother at bedside 5/16 Disposition Plan:  Status is: Inpatient Remains inpatient appropriate because: Need for IV medications and surgical procedure.  Consultants:  General Surgery  Procedures:  Hartman's procedure with small bowel resection and ileocecectomy 5/16  Antimicrobials:  Anti-infectives (From admission, onward)    Start     Dose/Rate Route Frequency Ordered Stop   10/13/23 1200  cefoTEtan (CEFOTAN) 2 g in sodium chloride  0.9 % 100 mL IVPB        2 g 200 mL/hr over 30 Minutes Intravenous On call to O.R. 10/12/23 0836 10/13/23 1331   10/13/23 1128  sodium chloride  0.9 % with cefoTEtan (CEFOTAN) ADS Med       Note to Pharmacy: Gabino Joe S: cabinet override      10/13/23 1128 10/13/23 1357   10/12/23 1300  metroNIDAZOLE  (FLAGYL ) tablet 500 mg        500 mg Oral 3 times daily 10/11/23 1212 10/12/23 2116   10/12/23 1300  neomycin (MYCIFRADIN) tablet 1,000 mg        1,000 mg Oral 3 times daily 10/11/23 1212 10/12/23 2136  10/11/23 1300  neomycin (MYCIFRADIN) tablet 1,000 mg  Status:  Discontinued        1,000 mg Oral 3 times daily 10/11/23 0839 10/11/23 1212   10/11/23 1300  metroNIDAZOLE  (FLAGYL ) tablet 500 mg  Status:  Discontinued        500 mg Oral 3  times daily 10/11/23 0839 10/11/23 1212   10/07/23 1500  piperacillin -tazobactam (ZOSYN ) IVPB 3.375 g        3.375 g 12.5 mL/hr over 240 Minutes Intravenous Every 8 hours 10/07/23 1356         Subjective: Patient seen and evaluated today with no new acute complaints or concerns. No acute concerns or events noted overnight.  Her pain is well-controlled and she denies any nausea or vomiting.  No output noted from ostomy.  Objective: Vitals:   10/14/23 0200 10/14/23 0300 10/14/23 0400 10/14/23 0431  BP: (!) 123/54 (!) 131/52 (!) 102/50 (!) 144/50  Pulse: 88 92 87 85  Resp:      Temp:   98.3 F (36.8 C)   TempSrc:   Axillary   SpO2: 96% 97% 98% 97%  Weight:   96.8 kg   Height:        Intake/Output Summary (Last 24 hours) at 10/14/2023 1246 Last data filed at 10/14/2023 0655 Gross per 24 hour  Intake 3395.61 ml  Output 1175 ml  Net 2220.61 ml   Filed Weights   10/07/23 0911 10/13/23 1159 10/14/23 0400  Weight: 85.3 kg 85.3 kg 96.8 kg    Examination:  General exam: Appears calm and comfortable  Respiratory system: Clear to auscultation. Respiratory effort normal. Cardiovascular system: S1 & S2 heard, RRR.  Gastrointestinal system: Abdomen with JP drain and ostomy with honeycomb dressing Central nervous system: Alert and awake Extremities: Diffuse edema to upper and lower extremities noted, nonpitting Skin: No significant lesions noted Psychiatry: Flat affect. Foley with clear, yellow urine output noted    Data Reviewed: I have personally reviewed following labs and imaging studies  CBC: Recent Labs  Lab 10/11/23 0526 10/12/23 0427 10/13/23 0431 10/13/23 2050 10/14/23 0443  WBC 5.6 4.5 4.2 23.1* 18.1*  HGB 7.9* 7.9* 7.6* 10.0* 8.9*  HCT 24.1* 24.0* 23.1* 28.7* 26.7*  MCV 94.5 94.9 94.7 88.9 88.4  PLT 190 178 163 145* 153   Basic Metabolic Panel: Recent Labs  Lab 10/09/23 0440 10/10/23 0701 10/11/23 0526 10/13/23 0431 10/14/23 0443  NA 135 132* 137 136  136  K 3.6 2.9* 4.0 3.5 4.0  CL 106 101 109 106 108  CO2 22 21* 23 22 22   GLUCOSE 88 156* 79 70 144*  BUN 25* 20 17 14 16   CREATININE 2.00* 1.63* 1.74* 1.45* 1.40*  CALCIUM 7.7* 7.7* 8.0* 7.8* 7.7*  MG 1.9  --  1.8 1.6* 1.6*  PHOS 2.8  --   --   --  3.1   GFR: Estimated Creatinine Clearance: 37.4 mL/min (A) (by C-G formula based on SCr of 1.4 mg/dL (H)). Liver Function Tests: Recent Labs  Lab 10/14/23 0443  AST 18  ALT 14  ALKPHOS 29*  BILITOT 0.5  PROT 3.8*  ALBUMIN 1.9*   No results for input(s): "LIPASE", "AMYLASE" in the last 168 hours.  No results for input(s): "AMMONIA" in the last 168 hours. Coagulation Profile: No results for input(s): "INR", "PROTIME" in the last 168 hours. Cardiac Enzymes: No results for input(s): "CKTOTAL", "CKMB", "CKMBINDEX", "TROPONINI" in the last 168 hours. BNP (last 3 results) No results for input(s): "PROBNP" in  the last 8760 hours. HbA1C: No results for input(s): "HGBA1C" in the last 72 hours. CBG: No results for input(s): "GLUCAP" in the last 168 hours. Lipid Profile: No results for input(s): "CHOL", "HDL", "LDLCALC", "TRIG", "CHOLHDL", "LDLDIRECT" in the last 72 hours. Thyroid Function Tests: No results for input(s): "TSH", "T4TOTAL", "FREET4", "T3FREE", "THYROIDAB" in the last 72 hours. Anemia Panel: No results for input(s): "VITAMINB12", "FOLATE", "FERRITIN", "TIBC", "IRON", "RETICCTPCT" in the last 72 hours. Sepsis Labs: No results for input(s): "PROCALCITON", "LATICACIDVEN" in the last 168 hours.  Recent Results (from the past 240 hours)  Aerobic/Anaerobic Culture w Gram Stain (surgical/deep wound)     Status: None (Preliminary result)   Collection Time: 10/13/23  3:58 PM   Specimen: Path fluid; GI  Result Value Ref Range Status   Specimen Description ABSCESS  Final   Special Requests INTRAABDOMINAL  Final   Gram Stain   Final    FEW WBC PRESENT, PREDOMINANTLY PMN NO ORGANISMS SEEN    Culture   Final    NO GROWTH < 12  HOURS Performed at [facility], [address] [place], [place] [address]    Report Status PENDING  Incomplete         Radiology Studies: No results found.      Scheduled Meds:  sodium chloride    Intravenous Once   acetaminophen  1,000 mg Oral Q6H   Chlorhexidine  Gluconate Cloth  6 each Topical Daily   furosemide  20 mg Intravenous Q12H   [START ON 10/15/2023] insulin aspart  0-15 Units Subcutaneous Q8H   levothyroxine   50 mcg Oral Daily   metoprolol  tartrate  2.5 mg Intravenous Q12H   pantoprazole (PROTONIX) IV  40 mg Intravenous QHS   sodium chloride  flush  10-40 mL Intracatheter Q12H   Continuous Infusions:  amiodarone  30 mg/hr (10/14/23 1054)   piperacillin -tazobactam (ZOSYN )  IV 12.5 mL/hr at 10/14/23 0655   TPN ADULT (ION)       LOS: 7 days    Time spent: 55 minutes    Mosie Angus Loran Rock, DO [facility]  If 7PM-7AM, please contact night-coverage [email or web address] 10/14/2023, 12:46 PM

## 2023-10-14 NOTE — Progress Notes (Signed)
 [facility] Progress Note  1 Day Post-Op  Subjective: Patient seen and examined.  She is resting comfortably in chair.  She states that her pain is controlled with oral medications, and similar to the pain she was experiencing preoperatively.  She denies nausea and vomiting.  She has not had any output from her ostomy.  NG tube is had minimal clear drainage.  JP drain in right lower quadrant with 155 cc of thin, dark sanguinous output.  Objective: Vital signs in last 24 hours: Temp:  [97.4 F (36.3 C)-98.3 F (36.8 C)] 98.3 F (36.8 C) (05/16 0400) Pulse Rate:  [67-92] 85 (05/16 0431) Resp:  [10-22] 11 (05/15 2300) BP: (94-144)/(43-80) 144/50 (05/16 0431) SpO2:  [92 %-100 %] 97 % (05/16 0431) Weight:  [96.8 kg] 96.8 kg (05/16 0400) Last BM Date : 10/12/23  Intake/Output from previous day: 05/15 0701 - 05/16 0700 In: 3395.6 [I.V.:2351.6; Blood:590; IV Piggyback:454.1] Out: 1175 [Urine:500; Drains:155; Blood:500] Intake/Output this shift: No intake/output data recorded.  General appearance: alert, cooperative, and no distress Nose: NG in place with minimal clear drainage in the canister GI: Abdomen soft, nondistended, no percussion tenderness, incisional tenderness to palpation; no rigidity, guarding, rebound tenderness; midline incision C/D/I with honeycomb dressing in place, left-sided ostomy hyperemic with some surrounding clotted blood, right lower quadrant JP drain in place with thin dark sanguinous output  Lab Results:  Recent Labs    10/13/23 2050 10/14/23 0443  WBC 23.1* 18.1*  HGB 10.0* 8.9*  HCT 28.7* 26.7*  PLT 145* 153   BMET Recent Labs    10/13/23 0431 10/14/23 0443  NA 136 136  K 3.5 4.0  CL 106 108  CO2 22 22  GLUCOSE 70 144*  BUN 14 16  CREATININE 1.45* 1.40*  CALCIUM 7.8* 7.7*   PT/INR No results for input(s): "LABPROT", "INR" in the last 72 hours.  Studies/Results: No results found.  Anti-infectives: Anti-infectives  (From admission, onward)    Start     Dose/Rate Route Frequency Ordered Stop   10/13/23 1200  cefoTEtan (CEFOTAN) 2 g in sodium chloride  0.9 % 100 mL IVPB        2 g 200 mL/hr over 30 Minutes Intravenous On call to O.R. 10/12/23 0836 10/13/23 1331   10/13/23 1128  sodium chloride  0.9 % with cefoTEtan (CEFOTAN) ADS Med       Note to Pharmacy: Gabino Joe S: cabinet override      10/13/23 1128 10/13/23 1357   10/12/23 1300  metroNIDAZOLE  (FLAGYL ) tablet 500 mg        500 mg Oral 3 times daily 10/11/23 1212 10/12/23 2116   10/12/23 1300  neomycin (MYCIFRADIN) tablet 1,000 mg        1,000 mg Oral 3 times daily 10/11/23 1212 10/12/23 2136   10/11/23 1300  neomycin (MYCIFRADIN) tablet 1,000 mg  Status:  Discontinued        1,000 mg Oral 3 times daily 10/11/23 0839 10/11/23 1212   10/11/23 1300  metroNIDAZOLE  (FLAGYL ) tablet 500 mg  Status:  Discontinued        500 mg Oral 3 times daily 10/11/23 0839 10/11/23 1212   10/07/23 1500  piperacillin -tazobactam (ZOSYN ) IVPB 3.375 g        3.375 g 12.5 mL/hr over 240 Minutes Intravenous Every 8 hours 10/07/23 1356         Assessment/Plan:  Patient is a 78 year old female who was 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  She is  status post Hartman's procedure with small bowel resection and ileocecectomy on 5/16.  - Patient with increase in her leukocytosis to 18.1 from 14.2.  Suspect likely component of this is reactive.  Patient with an abscess noted at the time of surgery, so would continue IV antibiotics - Continue IV Zosyn  - Gram stain from surgery with no organisms noted.  Await culture results - Continue NPO, NG to LIS until patient with return of bowel function - Suspect patient may develop a postoperative ileus given the extent of her surgery - TPN ordered in addition to Lasix given patient's poor nutritional status and volume overload - Okay to clamp NG tube for medication  administration.  Also okay for some of her IV medications to be transitioned to oral from surgical standpoint - Monitor for return of bowel function - Ostomy nurse consulted, appreciate recommendations - JP drain care - Plan to maintain Foley currently given Lasix administration.  Okay for Foley removal from a surgical standpoint whenever the hospitalist deems the Foley is no longer necessary - Patient's as needed Tylenol changed to scheduled.  Oral oxycodone  also ordered as needed - Would hold heparin  drip currently, as patient had to receive 2 units PRBCs intraoperatively.  Hemoglobin 8.9 this morning.  Will continue to monitor with daily labs and JP drain output - Appreciate hospitalist and consultants recommendations   LOS: 7 days    Felicia Frank A Sarath Privott 10/14/2023  Note: Portions of this report may have been transcribed using voice recognition software. Every effort has been made to ensure accuracy; however, inadvertent computerized transcription errors may still be present.

## 2023-10-14 NOTE — Progress Notes (Signed)
 PHARMACY - TOTAL PARENTERAL NUTRITION CONSULT NOTE   Indication:  S/p partial colectomy, ileocecectomy, partial resection.  Patient Measurements: Height: 5\' 4"  (162.6 cm) Weight: 96.8 kg (213 lb 6.5 oz) IBW/kg (Calculated) : 54.7 TPN AdjBW (KG): 62.4 Body mass index is 36.63 kg/m. Usual Weight: Unknown  Assessment: 78 year old female hx Afib, CKD, obesity admitted with perforated sigmoid diverticulitis now POD 1 partial colectomy with end colostomy, ileocecectomy, partial small bowel resection   Glucose / Insulin: No prior Hx DM, CBGs 144 Electrolytes: WNL x Mg 1.6 (repleted). Alb 1.8 with corrected calcium 9.4 Renal: Hx CKD, Scr 1.4 Hepatic: AlkPhos 29 Intake / Output; MIVF: + 2,220. No MIVF GI Imaging: GI Surgeries / Procedures:  5/15 partial colectomy/small bowel resection  Central access: Double lumen PICC since 5/11 TPN start date: 5/16  Nutritional Goals: Awaiting RD recommendations. For now will start at the following Goal TPN rate is 80 mL/hr (provides 86 g of protein and 1880 kcals per day)  RD Assessment: Pending    Current Nutrition:  NPO  Plan:  Start TPN at 40 mL/hr at 1800 Electrolytes in TPN: Na 17mEq/L, K 50mEq/L, Ca 71mEq/L, Mg 55mEq/L, and Phos 15mmol/L. Cl:Ac 1:1 Add standard MVI and trace elements to TPN Initiate Moderate q8h SSI and adjust as needed  Monitor TPN labs on Mon/Thurs, f/u am labs  Ulyses Gandy, PharmD Clinical Pharmacist 10/14/2023 9:32 AM

## 2023-10-14 NOTE — Anesthesia Postprocedure Evaluation (Signed)
 Anesthesia Post Note  Patient: Felicia Frank  Procedure(s) Performed: PARTIAL COLECTOMY, WITH COLOSTOMY CREATION, PARTIAL SMALL BOWEL RESECTION, ILEOCECECTOMY (Abdomen)  Patient location during evaluation: Phase II Anesthesia Type: General Level of consciousness: awake Pain management: pain level controlled Vital Signs Assessment: post-procedure vital signs reviewed and stable Respiratory status: spontaneous breathing and respiratory function stable Cardiovascular status: blood pressure returned to baseline and stable Postop Assessment: no headache and no apparent nausea or vomiting Anesthetic complications: no Comments: Late entry   No notable events documented.   Last Vitals:  Vitals:   10/14/23 2027 10/14/23 2100  BP:  (!) 138/54  Pulse:  73  Resp:  17  Temp: (!) 36.4 C   SpO2:  97%    Last Pain:  Vitals:   10/14/23 2027  TempSrc: Oral  PainSc:                  Felicia Frank

## 2023-10-14 NOTE — Consult Note (Signed)
 WOC team consulted for new colostomy placed by Dr. Larrie Po 10/13/2023. Patient currently in ICU setting.    WOC team will follow ostomy for education and support.   Thank you,    Ronni Colace MSN, RN-BC, [employer] [phone]

## 2023-10-15 DIAGNOSIS — K5720 Diverticulitis of large intestine with perforation and abscess without bleeding: Secondary | ICD-10-CM | POA: Diagnosis not present

## 2023-10-15 LAB — COMPREHENSIVE METABOLIC PANEL WITH GFR
ALT: 12 U/L (ref 0–44)
AST: 14 U/L — ABNORMAL LOW (ref 15–41)
Albumin: 2.8 g/dL — ABNORMAL LOW (ref 3.5–5.0)
Alkaline Phosphatase: 23 U/L — ABNORMAL LOW (ref 38–126)
Anion gap: 7 (ref 5–15)
BUN: 23 mg/dL (ref 8–23)
CO2: 24 mmol/L (ref 22–32)
Calcium: 8.2 mg/dL — ABNORMAL LOW (ref 8.9–10.3)
Chloride: 101 mmol/L (ref 98–111)
Creatinine, Ser: 1.63 mg/dL — ABNORMAL HIGH (ref 0.44–1.00)
GFR, Estimated: 32 mL/min — ABNORMAL LOW (ref >60)
Glucose, Bld: 122 mg/dL — ABNORMAL HIGH (ref 70–99)
Potassium: 3.5 mmol/L (ref 3.5–5.1)
Sodium: 132 mmol/L — ABNORMAL LOW (ref 135–145)
Total Bilirubin: 0.3 mg/dL (ref 0.0–1.2)
Total Protein: 4.5 g/dL — ABNORMAL LOW (ref 6.5–8.1)

## 2023-10-15 LAB — CBC
HCT: 20.7 % — ABNORMAL LOW (ref 36.0–46.0)
Hemoglobin: 6.8 g/dL — CL (ref 12.0–15.0)
MCH: 29.6 pg (ref 26.0–34.0)
MCHC: 32.9 g/dL (ref 30.0–36.0)
MCV: 90 fL (ref 80.0–100.0)
Platelets: 135 10*3/uL — ABNORMAL LOW (ref 150–400)
RBC: 2.3 MIL/uL — ABNORMAL LOW (ref 3.87–5.11)
RDW: 17.5 % — ABNORMAL HIGH (ref 11.5–15.5)
WBC: 10.1 10*3/uL (ref 4.0–10.5)
nRBC: 0 % (ref 0.0–0.2)

## 2023-10-15 LAB — MAGNESIUM: Magnesium: 1.9 mg/dL (ref 1.7–2.4)

## 2023-10-15 LAB — GLUCOSE, CAPILLARY
Glucose-Capillary: 109 mg/dL — ABNORMAL HIGH (ref 70–99)
Glucose-Capillary: 129 mg/dL — ABNORMAL HIGH (ref 70–99)
Glucose-Capillary: 130 mg/dL — ABNORMAL HIGH (ref 70–99)

## 2023-10-15 LAB — HEMOGLOBIN AND HEMATOCRIT, BLOOD
HCT: 21.5 % — ABNORMAL LOW (ref 36.0–46.0)
HCT: 30 % — ABNORMAL LOW (ref 36.0–46.0)
Hemoglobin: 7.2 g/dL — ABNORMAL LOW (ref 12.0–15.0)
Hemoglobin: 10.8 g/dL — ABNORMAL LOW (ref 12.0–15.0)

## 2023-10-15 LAB — PREPARE RBC (CROSSMATCH)

## 2023-10-15 MED ORDER — TRAVASOL 10 % IV SOLN
INTRAVENOUS | Status: AC
  Filled 2023-10-15: qty 720

## 2023-10-15 MED ORDER — MELATONIN 3 MG PO TABS
6.0000 mg | ORAL_TABLET | Freq: Once | ORAL | Status: AC
  Administered 2023-10-15: 6 mg via ORAL
  Filled 2023-10-15: qty 2

## 2023-10-15 MED ORDER — SODIUM CHLORIDE 0.9% IV SOLUTION: Freq: Once | INTRAVENOUS | Status: AC

## 2023-10-15 NOTE — Progress Notes (Signed)
 PROGRESS NOTE    Felicia Frank  FAO:[id number] DOB: February 08, 1946 DOA: 10/07/2023 PCP: Allana Ishikawa, MD   Brief Narrative:    Felicia Frank is a 78 y.o. female with medical history significant of hypothyroidism, hypertension, chronic kidney disease stage IIIa, class I obesity, atrial fibrillation and recent hospitalization secondary to diverticulitis with perforation and contained abscess status post drain placement and enteral fistula formation; who presented to the hospital secondary to still ongoing abdominal pain, associated nausea/intermittent vomiting and difficulty keeping things down.  Patient has been admitted with diverticulitis of the colon with perforation and enterocolonic fistula.  She is status post Hartman's procedure with small bowel resection and ileocecectomy on 5/15.  Patient's to start TPN as well as Lasix .  Assessment & Plan:   Principal Problem:   Diverticulitis of colon with perforation Active Problems:   Fistula of large intestine  Assessment and Plan:   diverticulitis of the colon with perforation and enterocolonic fistula status post Hartman's procedure with small bowel resection and ileocecectomy 5/15. -Continue current IV antibiotics - Appreciate general surgery recommendations to continue n.p.o. status and start TPN - Started on Lasix  5/16 and -1 L fluid balance noted in the last 24 hours with stable creatinine levels -Okay to clamp NG tube for medication administration -Continue Foley catheter for diuresis   chronic paroxysmal atrial fibrillation with RVR currently in sinus rhythm - Will continue the use of adjusted dose IV metoprolol  and SR amiodarone  drip with loading dose - Hold heparin  infusion for now per general surgery and until hemoglobin levels stabilize  Worsening anemia -Plan for 2 unit PRBC transfusion today   essential hypertension - Blood pressures currently soft - Continue adjusted antihypertensive agents and follow vital  signs.   hypothyroidism - Continue Synthroid . - Taking p.o.   acute kidney injury in the setting of chronic kidney disease stage IIIa - Creatinine improving with fluid resuscitation and currently down to 1.4 from 1.9 at time of admission - Will continue to maintain adequate hydration and follow renal function trend - Continue to minimize nephrotoxic agents. - Now appears volume overloaded, avoid further IV fluids and plan for diuresis status post procedure   GERD - Continue PPI   compression fracture of L1 - Continue as needed analgesia - Will recommend continued use of TLSO brace at discharge.   class I obesity -Body mass index is 32.28 kg/m. - Low-calorie diet and portion control discussed with patient.    DVT prophylaxis: Heparin  drip currently held Code Status: Full Family Communication: Brother at bedside 5/16 Disposition Plan:  Status is: Inpatient Remains inpatient appropriate because: Need for IV medications and surgical procedure.  Consultants:  General Surgery  Procedures:  Hartman's procedure with small bowel resection and ileocecectomy 5/16  Antimicrobials:  Anti-infectives (From admission, onward)    Start     Dose/Rate Route Frequency Ordered Stop   10/13/23 1200  cefoTEtan  (CEFOTAN ) 2 g in sodium chloride  0.9 % 100 mL IVPB        2 g 200 mL/hr over 30 Minutes Intravenous On call to O.R. 10/12/23 0836 10/13/23 1401   10/13/23 1128  sodium chloride  0.9 % with cefoTEtan  (CEFOTAN ) ADS Med       Note to Pharmacy: Gabino Joe S: cabinet override      10/13/23 1128 10/13/23 1357   10/12/23 1300  metroNIDAZOLE  (FLAGYL ) tablet 500 mg        500 mg Oral 3 times daily 10/11/23 1212 10/12/23 2116   10/12/23 1300  neomycin  (MYCIFRADIN )  tablet 1,000 mg        1,000 mg Oral 3 times daily 10/11/23 1212 10/12/23 2136   10/11/23 1300  neomycin  (MYCIFRADIN ) tablet 1,000 mg  Status:  Discontinued        1,000 mg Oral 3 times daily 10/11/23 0839 10/11/23 1212    10/11/23 1300  metroNIDAZOLE  (FLAGYL ) tablet 500 mg  Status:  Discontinued        500 mg Oral 3 times daily 10/11/23 0839 10/11/23 1212   10/07/23 1500  piperacillin -tazobactam (ZOSYN ) IVPB 3.375 g        3.375 g 12.5 mL/hr over 240 Minutes Intravenous Every 8 hours 10/07/23 1356         Subjective: Patient seen and evaluated today with no new acute complaints or concerns. No acute concerns or events noted overnight.  Her pain is well-controlled and she denies any nausea or vomiting.  She is noted to have drop in hemoglobin levels this morning, but with no significant overt bleeding.  Objective: Vitals:   10/15/23 0900 10/15/23 1000 10/15/23 1008 10/15/23 1023  BP: (!) 130/38 (!) 126/38  (!) 132/38  Pulse: 65 68 66 67  Resp: (!) 7 20 12 15   Temp:  (!) 97.5 F (36.4 C)  (!) 97.4 F (36.3 C)  TempSrc:  Oral  Oral  SpO2: 98% 98% 96% 98%  Weight:      Height:        Intake/Output Summary (Last 24 hours) at 10/15/2023 1054 Last data filed at 10/15/2023 0739 Gross per 24 hour  Intake 784.79 ml  Output 2415 ml  Net -1630.21 ml   Filed Weights   10/13/23 1159 10/14/23 0400 10/15/23 0702  Weight: 85.3 kg 96.8 kg 97.4 kg    Examination:  General exam: Appears calm and comfortable  Respiratory system: Clear to auscultation. Respiratory effort normal. Cardiovascular system: S1 & S2 heard, RRR.  Gastrointestinal system: Abdomen with JP drain and ostomy with honeycomb dressing Central nervous system: Alert and awake Extremities: Diffuse edema to upper and lower extremities noted, nonpitting Skin: No significant lesions noted Psychiatry: Flat affect. Foley with clear, yellow urine output noted    Data Reviewed: I have personally reviewed following labs and imaging studies  CBC: Recent Labs  Lab 10/12/23 0427 10/13/23 0431 10/13/23 2050 10/14/23 0443 10/15/23 0456 10/15/23 0719  WBC 4.5 4.2 23.1* 18.1* 10.1  --   HGB 7.9* 7.6* 10.0* 8.9* 6.8* 7.2*  HCT 24.0* 23.1*  28.7* 26.7* 20.7* 21.5*  MCV 94.9 94.7 88.9 88.4 90.0  --   PLT 178 163 145* 153 135*  --    Basic Metabolic Panel: Recent Labs  Lab 10/09/23 0440 10/10/23 0701 10/11/23 0526 10/13/23 0431 10/14/23 0443 10/15/23 0456  NA 135 132* 137 136 136 132*  K 3.6 2.9* 4.0 3.5 4.0 3.5  CL 106 101 109 106 108 101  CO2 22 21* 23 22 22 24   GLUCOSE 88 156* 79 70 144* 122*  BUN 25* 20 17 14 16 23   CREATININE 2.00* 1.63* 1.74* 1.45* 1.40* 1.63*  CALCIUM 7.7* 7.7* 8.0* 7.8* 7.7* 8.2*  MG 1.9  --  1.8 1.6* 1.6* 1.9  PHOS 2.8  --   --   --  3.1  --    GFR: Estimated Creatinine Clearance: 32.2 mL/min (A) (by C-G formula based on SCr of 1.63 mg/dL (H)). Liver Function Tests: Recent Labs  Lab 10/14/23 0443 10/15/23 0456  AST 18 14*  ALT 14 12  ALKPHOS 29* 23*  BILITOT 0.5 0.3  PROT 3.8* 4.5*  ALBUMIN  1.9* 2.8*   No results for input(s): "LIPASE", "AMYLASE" in the last 168 hours.  No results for input(s): "AMMONIA" in the last 168 hours. Coagulation Profile: No results for input(s): "INR", "PROTIME" in the last 168 hours. Cardiac Enzymes: No results for input(s): "CKTOTAL", "CKMB", "CKMBINDEX", "TROPONINI" in the last 168 hours. BNP (last 3 results) No results for input(s): "PROBNP" in the last 8760 hours. HbA1C: No results for input(s): "HGBA1C" in the last 72 hours. CBG: Recent Labs  Lab 10/15/23 0029 10/15/23 0727  GLUCAP 130* 129*   Lipid Profile: No results for input(s): "CHOL", "HDL", "LDLCALC", "TRIG", "CHOLHDL", "LDLDIRECT" in the last 72 hours. Thyroid Function Tests: No results for input(s): "TSH", "T4TOTAL", "FREET4", "T3FREE", "THYROIDAB" in the last 72 hours. Anemia Panel: No results for input(s): "VITAMINB12", "FOLATE", "FERRITIN", "TIBC", "IRON", "RETICCTPCT" in the last 72 hours. Sepsis Labs: No results for input(s): "PROCALCITON", "LATICACIDVEN" in the last 168 hours.  Recent Results (from the past 240 hours)  Aerobic/Anaerobic Culture w Gram Stain  (surgical/deep wound)     Status: None (Preliminary result)   Collection Time: 10/13/23  3:58 PM   Specimen: Path fluid; GI  Result Value Ref Range Status   Specimen Description ABSCESS  Final   Special Requests INTRAABDOMINAL  Final   Gram Stain   Final    FEW WBC PRESENT, PREDOMINANTLY PMN NO ORGANISMS SEEN    Culture   Final    RARE YEAST CULTURE REINCUBATED FOR BETTER GROWTH Performed at [facility], [address], [place], [place] [address]    Report Status PENDING  Incomplete         Radiology Studies: No results found.      Scheduled Meds:  sodium chloride    Intravenous Once   acetaminophen   1,000 mg Oral Q6H   Chlorhexidine  Gluconate Cloth  6 each Topical Daily   furosemide   20 mg Intravenous Q12H   insulin  aspart  0-15 Units Subcutaneous Q8H   levothyroxine   50 mcg Oral Daily   metoprolol  tartrate  2.5 mg Intravenous Q12H   pantoprazole  (PROTONIX ) IV  40 mg Intravenous QHS   sodium chloride  flush  10-40 mL Intracatheter Q12H   Continuous Infusions:  amiodarone  30 mg/hr (10/15/23 0319)   piperacillin -tazobactam (ZOSYN )  IV 3.375 g (10/15/23 0518)   TPN ADULT (ION) 40 mL/hr at 10/15/23 0319   TPN ADULT (ION)       LOS: 8 days    Time spent: 55 minutes    Muneeb Veras D Mason Sole, DO [facility]  If 7PM-7AM, please contact night-coverage [email or web address] 10/15/2023, 10:54 AM

## 2023-10-15 NOTE — Progress Notes (Signed)
 [facility] Progress Note  2 Days Post-Op  Subjective: Patient seen and examined.  She is resting comfortably in bed.  Her NG tube is had minimal output.  JP drain has had 65 cc of dark sanguineous output in the last 24 hours.  She complains of abdominal pain, slightly worse on the right.  Denies any ostomy output.  Denies nausea and vomiting.  Objective: Vital signs in last 24 hours: Temp:  [97.4 F (36.3 C)-98 F (36.7 C)] 97.5 F (36.4 C) (05/17 1146) Pulse Rate:  [59-87] 66 (05/17 1200) Resp:  [6-25] 14 (05/17 1200) BP: (126-161)/(38-58) 135/48 (05/17 1200) SpO2:  [95 %-100 %] 98 % (05/17 1200) Weight:  [97.4 kg] 97.4 kg (05/17 0702) Last BM Date : 10/12/23  Intake/Output from previous day: 05/16 0701 - 05/17 0700 In: 784.8 [I.V.:666; IV Piggyback:118.8] Out: 1915 [Urine:1850; Drains:65] Intake/Output this shift: Total I/O In: 452.3 [I.V.:438.2; IV Piggyback:14.1] Out: 1350 [Urine:1350]  General appearance: alert, cooperative, and no distress Nose: NG tube in place with minimal bilious output in canister GI: Abdomen soft, nondistended, no percussion tenderness, incisional and right-sided abdominal tenderness; no rigidity, guarding, rebound tenderness; midline incision C/D/I with honeycomb dressing in place, left-sided ostomy pink and patent with old clots around ostomy and bowel sweat in the bag, no evidence of active bleeding; JP drain in right lower quadrant with dark sanguinous output, serous drainage from around the drain  Lab Results:  Recent Labs    10/14/23 0443 10/15/23 0456 10/15/23 0719  WBC 18.1* 10.1  --   HGB 8.9* 6.8* 7.2*  HCT 26.7* 20.7* 21.5*  PLT 153 135*  --    BMET Recent Labs    10/14/23 0443 10/15/23 0456  NA 136 132*  K 4.0 3.5  CL 108 101  CO2 22 24  GLUCOSE 144* 122*  BUN 16 23  CREATININE 1.40* 1.63*  CALCIUM 7.7* 8.2*   PT/INR No results for input(s): "LABPROT", "INR" in the last 72  hours.  Studies/Results: No results found.  Anti-infectives: Anti-infectives (From admission, onward)    Start     Dose/Rate Route Frequency Ordered Stop   10/13/23 1200  cefoTEtan  (CEFOTAN ) 2 g in sodium chloride  0.9 % 100 mL IVPB        2 g 200 mL/hr over 30 Minutes Intravenous On call to O.R. 10/12/23 0836 10/13/23 1401   10/13/23 1128  sodium chloride  0.9 % with cefoTEtan  (CEFOTAN ) ADS Med       Note to Pharmacy: Gabino Joe S: cabinet override      10/13/23 1128 10/13/23 1357   10/12/23 1300  metroNIDAZOLE  (FLAGYL ) tablet 500 mg        500 mg Oral 3 times daily 10/11/23 1212 10/12/23 2116   10/12/23 1300  neomycin  (MYCIFRADIN ) tablet 1,000 mg        1,000 mg Oral 3 times daily 10/11/23 1212 10/12/23 2136   10/11/23 1300  neomycin  (MYCIFRADIN ) tablet 1,000 mg  Status:  Discontinued        1,000 mg Oral 3 times daily 10/11/23 0839 10/11/23 1212   10/11/23 1300  metroNIDAZOLE  (FLAGYL ) tablet 500 mg  Status:  Discontinued        500 mg Oral 3 times daily 10/11/23 0839 10/11/23 1212   10/07/23 1500  piperacillin -tazobactam (ZOSYN ) IVPB 3.375 g        3.375 g 12.5 mL/hr over 240 Minutes Intravenous Every 8 hours 10/07/23 1356         Assessment/Plan:  Patient is  a 78 year old female who was 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  She is status post Hartman's procedure with small bowel resection and ileocecectomy on 5/16.   - Patient's leukocytosis has resolved - Continue IV Zosyn  - Gram stain from surgery with no organisms noted.  Culture with rare yeast, await final results - Continue NPO, NG to LIS until patient with return of bowel function - Continue TPN - Okay to clamp NG tube for medication administration - Monitor for return of bowel function - Appreciate ostomy nurse recommendations - JP drain care.  Surrounding dressing may need to be changed multiple times throughout the day and evening, as she is having  serous drainage around her drain.  Also discussed importance of stripping the drain - Okay for Foley removal from a surgical standpoint whenever the hospitalist deems the Foley is no longer necessary -Patient's hemoglobin dropped from 8.9 to 6.8/7.2 this morning.  2 units PRBCs ordered.  Suspect patient likely with equilibration after her surgery.  Continue to monitor with daily labs and JP drain output - Would hold heparin  drip currently - Appreciate hospitalist and consultants recommendations   LOS: 8 days    Kristle Wesch A Saachi Zale 10/15/2023  Note: Portions of this report may have been transcribed using voice recognition software. Every effort has been made to ensure accuracy; however, inadvertent computerized transcription errors may still be present.

## 2023-10-15 NOTE — Progress Notes (Signed)
 Critical results documentation  10/15/23 0600  Provider Notification  Provider Name/Title Elyse Hand, MD  Date Provider Notified 10/15/23  Time Provider Notified [phone]  Method of Notification Page (secure chat)  Notification Reason Critical Result  Test performed and critical result Hemoglobin 6.8  Date Critical Result Received 10/15/23  Time Critical Result Received 0624  Provider response Other (Comment) (awaiting)  Date of Provider Response 10/15/23  Time of Provider Response [phone]

## 2023-10-15 NOTE — Progress Notes (Signed)
 PHARMACY - TOTAL PARENTERAL NUTRITION CONSULT NOTE   Indication:  S/p partial colectomy, ileocecectomy, partial resection.  Patient Measurements: Height: 5\' 4"  (162.6 cm) Weight: 97.4 kg (214 lb 11.7 oz) IBW/kg (Calculated) : 54.7 TPN AdjBW (KG): 62.4 Body mass index is 36.86 kg/m. Usual Weight: Unknown  Assessment: 78 year old female hx Afib, CKD, obesity admitted with perforated sigmoid diverticulitis now POD 2 partial colectomy with end colostomy, ileocecectomy, partial small bowel resection. At risk for refeeding d/t minimal intake since admission.  Glucose / Insulin : No prior Hx DM, CBGs 122-130, 2 units Electrolytes: Na 132, K 3.5, Mag 1.9. Corrected Ca 9.2 Renal: Hx CKD, Scr 1.4>1.6 Hepatic: AlkPhos 24, AST 14 Intake / Output; MIVF: - 1130. No MIVF. Noted bloody drainage with drop in Hgb GI Imaging: GI Surgeries / Procedures:  5/15 partial colectomy/small bowel resection  Central access: Double lumen PICC since 5/11 TPN start date: 5/16  Nutritional Goals: Goal TPN rate is 75 ml/hr over 24 hours (provides 90 g of protein and 1800 kcals per day)   RD Assessment: Estimated Needs Total Energy Estimated Needs: 1700-1900 Total Protein Estimated Needs: 90-110 gm Total Fluid Estimated Needs: 1.7-1.9 L  Current Nutrition:  NPO  Plan:  Increase TPN to 60 mL/hr at 1800 Electrolytes in TPN: Na 88mEq/L, K 49mEq/L, Ca 45mEq/L, Mg 21mEq/L, and Phos 15mmol/L. Cl:Ac 1:2 Add standard MVI and trace elements to TPN Add Thiamine 100mg  Continue Moderate q8h SSI and adjust as needed  Monitor TPN labs on Mon/Thurs, f/u am labs  Ulyses Gandy, PharmD Clinical Pharmacist 10/15/2023 8:01 AM

## 2023-10-15 NOTE — Progress Notes (Signed)
 Patient's drain site dressing looks blood soaked, reinforced with ABDs, drain 35ml for the shift from JP drain, no remarkable output from ielostomy, some bloody output, endorsed to daytime RN, hemoglobin dropping, MD made aware. Repeat Hemoglobin ordered. Awaiting results.

## 2023-10-16 DIAGNOSIS — K5720 Diverticulitis of large intestine with perforation and abscess without bleeding: Secondary | ICD-10-CM | POA: Diagnosis not present

## 2023-10-16 LAB — TYPE AND SCREEN
ABO/RH(D): A POS
Antibody Screen: NEGATIVE
Unit division: 0
Unit division: 0

## 2023-10-16 LAB — GLUCOSE, CAPILLARY
Glucose-Capillary: 117 mg/dL — ABNORMAL HIGH (ref 70–99)
Glucose-Capillary: 129 mg/dL — ABNORMAL HIGH (ref 70–99)
Glucose-Capillary: 138 mg/dL — ABNORMAL HIGH (ref 70–99)
Glucose-Capillary: 146 mg/dL — ABNORMAL HIGH (ref 70–99)

## 2023-10-16 LAB — BPAM RBC
Blood Product Expiration Date: 202505282359
Blood Product Expiration Date: 202505312359
ISSUE DATE / TIME: 202505170955
ISSUE DATE / TIME: 202505171418
Unit Type and Rh: 6200
Unit Type and Rh: 6200

## 2023-10-16 LAB — CBC
HCT: 29 % — ABNORMAL LOW (ref 36.0–46.0)
Hemoglobin: 10.1 g/dL — ABNORMAL LOW (ref 12.0–15.0)
MCH: 30.8 pg (ref 26.0–34.0)
MCHC: 34.8 g/dL (ref 30.0–36.0)
MCV: 88.4 fL (ref 80.0–100.0)
Platelets: 128 K/uL — ABNORMAL LOW (ref 150–400)
RBC: 3.28 MIL/uL — ABNORMAL LOW (ref 3.87–5.11)
RDW: 16.6 % — ABNORMAL HIGH (ref 11.5–15.5)
WBC: 8.4 K/uL (ref 4.0–10.5)
nRBC: 0 % (ref 0.0–0.2)

## 2023-10-16 LAB — BASIC METABOLIC PANEL WITH GFR
Anion gap: 6 (ref 5–15)
BUN: 28 mg/dL — ABNORMAL HIGH (ref 8–23)
CO2: 28 mmol/L (ref 22–32)
Calcium: 7.8 mg/dL — ABNORMAL LOW (ref 8.9–10.3)
Chloride: 96 mmol/L — ABNORMAL LOW (ref 98–111)
Creatinine, Ser: 1.56 mg/dL — ABNORMAL HIGH (ref 0.44–1.00)
GFR, Estimated: 34 mL/min — ABNORMAL LOW
Glucose, Bld: 108 mg/dL — ABNORMAL HIGH (ref 70–99)
Potassium: 3.8 mmol/L (ref 3.5–5.1)
Sodium: 130 mmol/L — ABNORMAL LOW (ref 135–145)

## 2023-10-16 LAB — MAGNESIUM: Magnesium: 1.6 mg/dL — ABNORMAL LOW (ref 1.7–2.4)

## 2023-10-16 MED ORDER — AMIODARONE IV BOLUS ONLY 150 MG/100ML
150.0000 mg | Freq: Once | INTRAVENOUS | Status: AC
  Administered 2023-10-16: 150 mg via INTRAVENOUS

## 2023-10-16 MED ORDER — MAGNESIUM SULFATE 2 GM/50ML IV SOLN
2.0000 g | Freq: Once | INTRAVENOUS | Status: AC
  Administered 2023-10-16: 2 g via INTRAVENOUS
  Filled 2023-10-16: qty 50

## 2023-10-16 MED ORDER — METOPROLOL TARTRATE 5 MG/5ML IV SOLN
5.0000 mg | Freq: Four times a day (QID) | INTRAVENOUS | Status: DC
  Administered 2023-10-16 – 2023-10-23 (×27): 5 mg via INTRAVENOUS
  Filled 2023-10-16 (×27): qty 5

## 2023-10-16 MED ORDER — TRAVASOL 10 % IV SOLN
INTRAVENOUS | Status: AC
  Filled 2023-10-16: qty 900

## 2023-10-16 NOTE — Plan of Care (Signed)
   Problem: Health Behavior/Discharge Planning: Goal: Ability to manage health-related needs will improve Outcome: Progressing   Problem: Clinical Measurements: Goal: Ability to maintain clinical measurements within normal limits will improve Outcome: Progressing Goal: Will remain free from infection Outcome: Progressing Goal: Diagnostic test results will improve Outcome: Progressing

## 2023-10-16 NOTE — Plan of Care (Signed)

## 2023-10-16 NOTE — Progress Notes (Signed)
 Ostomy bag burped multiple times through out the night, due to excessive amount of gas filling up ostomy bag. Positive bowel sounds.

## 2023-10-16 NOTE — Progress Notes (Signed)
 PROGRESS NOTE    Felicia Frank  ZOX:[id number] DOB: Aug 21, 1945 DOA: 10/07/2023 PCP: Allana Ishikawa, MD   Brief Narrative:    Felicia Frank is a 78 y.o. female with medical history significant of hypothyroidism, hypertension, chronic kidney disease stage IIIa, class I obesity, atrial fibrillation and recent hospitalization secondary to diverticulitis with perforation and contained abscess status post drain placement and enteral fistula formation; who presented to the hospital secondary to still ongoing abdominal pain, associated nausea/intermittent vomiting and difficulty keeping things down.  Patient has been admitted with diverticulitis of the colon with perforation and enterocolonic fistula.  She is status post Hartman's procedure with small bowel resection and ileocecectomy on 5/15.  Patient remains on TPN and Lasix  and continues to have some elevated heart rates.  Assessment & Plan:   Principal Problem:   Diverticulitis of colon with perforation Active Problems:   Fistula of large intestine  Assessment and Plan:   diverticulitis of the colon with perforation and enterocolonic fistula status post Hartman's procedure with small bowel resection and ileocecectomy 5/15. -Continue current IV antibiotics with Zosyn  for 5 days postoperatively discontinue after final doses on 5/20 - Appreciate general surgery recommendations to continue n.p.o. status and start TPN - Started on Lasix  5/16 and -1.7 L fluid balance noted in the last 24 hours with stable creatinine levels with -2.7 L fluid balance since initiation of Lasix   -Okay to clamp NG tube for medication administration -Continue Foley catheter for diuresis   chronic paroxysmal atrial fibrillation with RVR currently in sinus rhythm - Will continue the use of adjusted dose IV metoprolol  and SR amiodarone  drip with loading dose - Hold heparin  infusion for now per general surgery and until hemoglobin levels stabilize  Worsening  anemia - Status post 2 unit PRBC transfusion 5/17 and currently stable -Continue to monitor CBC   essential hypertension - Continue to monitor while on aggressive diuresis   hypothyroidism - Continue Synthroid . - Taking p.o.   acute kidney injury in the setting of chronic kidney disease stage IIIa - Creatinine improving with fluid resuscitation and currently down to 1.4 from 1.9 at time of admission - Will continue to maintain adequate hydration and follow renal function trend - Continue to minimize nephrotoxic agents. - Now appears volume overloaded, avoid further IV fluids and plan for diuresis status post procedure   GERD - Continue PPI   compression fracture of L1 - Continue as needed analgesia - Will recommend continued use of TLSO brace at discharge.   class I obesity -Body mass index is 32.28 kg/m. - Low-calorie diet and portion control discussed with patient.    DVT prophylaxis: Heparin  drip currently held Code Status: Full Family Communication: Brother at bedside 5/16 Disposition Plan:  Status is: Inpatient Remains inpatient appropriate because: Need for IV medications and surgical procedure.  Consultants:  General Surgery  Procedures:  Hartman's procedure with small bowel resection and ileocecectomy 5/16  Antimicrobials:  Anti-infectives (From admission, onward)    Start     Dose/Rate Route Frequency Ordered Stop   10/13/23 1200  cefoTEtan  (CEFOTAN ) 2 g in sodium chloride  0.9 % 100 mL IVPB        2 g 200 mL/hr over 30 Minutes Intravenous On call to O.R. 10/12/23 0836 10/13/23 1401   10/13/23 1128  sodium chloride  0.9 % with cefoTEtan  (CEFOTAN ) ADS Med       Note to Pharmacy: Gabino Joe S: cabinet override      10/13/23 1128 10/13/23 1357   10/12/23 1300  metroNIDAZOLE  (FLAGYL ) tablet 500 mg        500 mg Oral 3 times daily 10/11/23 1212 10/12/23 2116   10/12/23 1300  neomycin  (MYCIFRADIN ) tablet 1,000 mg        1,000 mg Oral 3 times daily  10/11/23 1212 10/12/23 2136   10/11/23 1300  neomycin  (MYCIFRADIN ) tablet 1,000 mg  Status:  Discontinued        1,000 mg Oral 3 times daily 10/11/23 0839 10/11/23 1212   10/11/23 1300  metroNIDAZOLE  (FLAGYL ) tablet 500 mg  Status:  Discontinued        500 mg Oral 3 times daily 10/11/23 0839 10/11/23 1212   10/07/23 1500  piperacillin -tazobactam (ZOSYN ) IVPB 3.375 g        3.375 g 12.5 mL/hr over 240 Minutes Intravenous Every 8 hours 10/07/23 1356         Subjective: Patient seen and evaluated today with no new acute complaints or concerns.  She was moved into a chair earlier this morning and has had no acute overnight events noted.  NG tube with minimal output and no gas or stool in colostomy.  Minimal serosanguineous drainage noted.  She has noted to be more tachycardic this a.m.  Objective: Vitals:   10/16/23 0600 10/16/23 0700 10/16/23 0803 10/16/23 1129  BP: (!) 109/36 (!) 109/44    Pulse: 77 85    Resp: 12 18    Temp:   98.1 F (36.7 C) 98.4 F (36.9 C)  TempSrc:   Oral Oral  SpO2: 95% 96%    Weight:      Height:        Intake/Output Summary (Last 24 hours) at 10/16/2023 1147 Last data filed at 10/16/2023 1100 Gross per 24 hour  Intake 1826.83 ml  Output 2730 ml  Net -903.17 ml   Filed Weights   10/14/23 0400 10/15/23 0702 10/16/23 0500  Weight: 96.8 kg 97.4 kg 87.6 kg    Examination:  General exam: Appears calm and comfortable  Respiratory system: Clear to auscultation. Respiratory effort normal. Cardiovascular system: S1 & S2 heard, irregular tachycardic Gastrointestinal system: Abdomen with JP drain and ostomy with honeycomb dressing Central nervous system: Alert and awake Extremities: Diffuse edema to upper and lower extremities noted, nonpitting Skin: No significant lesions noted Psychiatry: Flat affect. Foley with clear, yellow urine output noted    Data Reviewed: I have personally reviewed following labs and imaging studies  CBC: Recent Labs  Lab  10/13/23 0431 10/13/23 2050 10/14/23 0443 10/15/23 0456 10/15/23 0719 10/15/23 1818 10/16/23 0620  WBC 4.2 23.1* 18.1* 10.1  --   --  8.4  HGB 7.6* 10.0* 8.9* 6.8* 7.2* 10.8* 10.1*  HCT 23.1* 28.7* 26.7* 20.7* 21.5* 30.0* 29.0*  MCV 94.7 88.9 88.4 90.0  --   --  88.4  PLT 163 145* 153 135*  --   --  128*   Basic Metabolic Panel: Recent Labs  Lab 10/11/23 0526 10/13/23 0431 10/14/23 0443 10/15/23 0456 10/16/23 0620  NA 137 136 136 132* 130*  K 4.0 3.5 4.0 3.5 3.8  CL 109 106 108 101 96*  CO2 23 22 22 24 28   GLUCOSE 79 70 144* 122* 108*  BUN 17 14 16 23  28*  CREATININE 1.74* 1.45* 1.40* 1.63* 1.56*  CALCIUM 8.0* 7.8* 7.7* 8.2* 7.8*  MG 1.8 1.6* 1.6* 1.9 1.6*  PHOS  --   --  3.1  --   --    GFR: Estimated Creatinine Clearance: 31.9 mL/min (A) (by  C-G formula based on SCr of 1.56 mg/dL (H)). Liver Function Tests: Recent Labs  Lab 10/14/23 0443 10/15/23 0456  AST 18 14*  ALT 14 12  ALKPHOS 29* 23*  BILITOT 0.5 0.3  PROT 3.8* 4.5*  ALBUMIN  1.9* 2.8*   No results for input(s): "LIPASE", "AMYLASE" in the last 168 hours.  No results for input(s): "AMMONIA" in the last 168 hours. Coagulation Profile: No results for input(s): "INR", "PROTIME" in the last 168 hours. Cardiac Enzymes: No results for input(s): "CKTOTAL", "CKMB", "CKMBINDEX", "TROPONINI" in the last 168 hours. BNP (last 3 results) No results for input(s): "PROBNP" in the last 8760 hours. HbA1C: No results for input(s): "HGBA1C" in the last 72 hours. CBG: Recent Labs  Lab 10/15/23 0029 10/15/23 0727 10/15/23 1824 10/16/23 0006 10/16/23 0802  GLUCAP 130* 129* 109* 117* 129*   Lipid Profile: No results for input(s): "CHOL", "HDL", "LDLCALC", "TRIG", "CHOLHDL", "LDLDIRECT" in the last 72 hours. Thyroid Function Tests: No results for input(s): "TSH", "T4TOTAL", "FREET4", "T3FREE", "THYROIDAB" in the last 72 hours. Anemia Panel: No results for input(s): "VITAMINB12", "FOLATE", "FERRITIN", "TIBC",  "IRON", "RETICCTPCT" in the last 72 hours. Sepsis Labs: No results for input(s): "PROCALCITON", "LATICACIDVEN" in the last 168 hours.  Recent Results (from the past 240 hours)  Aerobic/Anaerobic Culture w Gram Stain (surgical/deep wound)     Status: None (Preliminary result)   Collection Time: 10/13/23  3:58 PM   Specimen: Path fluid; GI  Result Value Ref Range Status   Specimen Description ABSCESS  Final   Special Requests INTRAABDOMINAL  Final   Gram Stain   Final    FEW WBC PRESENT, PREDOMINANTLY PMN NO ORGANISMS SEEN Performed at [facility], [address], [place], [place] [address]    Culture   Final    RARE YEAST CULTURE REINCUBATED FOR BETTER GROWTH NO ANAEROBES ISOLATED; CULTURE IN PROGRESS FOR 5 DAYS    Report Status PENDING  Incomplete         Radiology Studies: No results found.      Scheduled Meds:  sodium chloride    Intravenous Once   acetaminophen   1,000 mg Oral Q6H   Chlorhexidine  Gluconate Cloth  6 each Topical Daily   furosemide   20 mg Intravenous Q12H   insulin  aspart  0-15 Units Subcutaneous Q8H   levothyroxine   50 mcg Oral Daily   metoprolol  tartrate  5 mg Intravenous Q6H   pantoprazole  (PROTONIX ) IV  40 mg Intravenous QHS   sodium chloride  flush  10-40 mL Intracatheter Q12H   Continuous Infusions:  amiodarone  30 mg/hr (10/16/23 1039)   piperacillin -tazobactam (ZOSYN )  IV 3.375 g (10/16/23 0602)   TPN ADULT (ION) 60 mL/hr at 10/15/23 2359   TPN ADULT (ION)       LOS: 9 days    Time spent: 55 minutes    Taylor Spilde D Mason Sole, DO [facility]  If 7PM-7AM, please contact night-coverage [email or web address] 10/16/2023, 11:47 AM

## 2023-10-16 NOTE — Progress Notes (Signed)
 PHARMACY - TOTAL PARENTERAL NUTRITION CONSULT NOTE   Indication:  S/p partial colectomy, ileocecectomy, partial resection.  Patient Measurements: Height: 5\' 4"  (162.6 cm) Weight: 87.6 kg (193 lb 2 oz) IBW/kg (Calculated) : 54.7 TPN AdjBW (KG): 62.4 Body mass index is 33.15 kg/m. Usual Weight: Unknown  Assessment: 78 year old female hx Afib, CKD, obesity admitted with perforated sigmoid diverticulitis now POD 2 partial colectomy with end colostomy, ileocecectomy, partial small bowel resection. At risk for refeeding d/t minimal intake since admission.  Glucose / Insulin : No prior Hx DM, CBGs 108-112 (no added insulin ) Electrolytes: Na 130, K 3.8, Mag 1.6. Corrected Ca 8.8 Renal: Hx CKD, Scr 1.4>1.6>1.56 Hepatic: AlkPhos 24, AST 14 Intake / Output; MIVF: - 1740. No MIVF.  GI Imaging: GI Surgeries / Procedures:  5/15 partial colectomy/small bowel resection  Central access: Double lumen PICC since 5/11 TPN start date: 5/16  Nutritional Goals: Goal TPN rate is 75 ml/hr over 24 hours (provides 90 g of protein and 1800 kcals per day)   RD Assessment: Estimated Needs Total Energy Estimated Needs: 1700-1900 Total Protein Estimated Needs: 90-110 gm Total Fluid Estimated Needs: 1.7-1.9 L  Current Nutrition:  NPO. 100% nutritional needs from TPN  Plan:  Increase TPN to goal 75 mL/hr at 1800 Electrolytes in TPN: Na 75 mEq/L, K 65mEq/L, Ca 35mEq/L, Mg 8 mEq/L, and Phos 58mmol/L. Change Cl:Ac 1:1 Magnesium  2gm IVPB x1 Add standard MVI and trace elements to TPN Add Thiamine 100mg  to TPN Continue Moderate q8h SSI and adjust as needed  Monitor TPN labs on Mon/Thurs, f/u am labs  Ulyses Gandy, PharmD Clinical Pharmacist 10/16/2023 7:50 AM

## 2023-10-16 NOTE — Progress Notes (Signed)
 [facility] Progress Note  3 Days Post-Op  Subjective: Patient seen and examined.  She is resting comfortably in bed.  She was sitting in the chair for 4 hours earlier today.  NG tube had minimal output.  She has not had any gas or stool from her colostomy.  She has abdominal pain.   Objective: Vital signs in last 24 hours: Temp:  [97.5 F (36.4 C)-98.5 F (36.9 C)] 98.4 F (36.9 C) (05/18 1129) Pulse Rate:  [66-85] 85 (05/18 0700) Resp:  [10-27] 18 (05/18 0700) BP: (96-157)/(34-53) 109/44 (05/18 0700) SpO2:  [94 %-98 %] 96 % (05/18 0700) Weight:  [87.6 kg] 87.6 kg (05/18 0500) Last BM Date : 10/16/23  Intake/Output from previous day: 05/17 0701 - 05/18 0700 In: 2279.1 [I.V.:1444.6; Blood:751.8; IV Piggyback:82.6] Out: 4020 [Urine:3850; Drains:120; Stool:50] Intake/Output this shift: Total I/O In: -  Out: 60 [Drains:60]  General appearance: alert, cooperative, and no distress Nose: NG tube in place with minimal bilious output in canister GI: Abdomen soft, nondistended, no percussion tenderness, incisional and right sided abdominal tenderness to palpation; no rigidity, guarding, rebound tenderness; midline incision C/D/I with honeycomb dressing in place, left-sided ostomy pink and patent with old clots and bowel sweat in bag, no evidence of active bleeding; JP drain in right lower quadrant with dark serosanguineous output  Lab Results:  Recent Labs    10/15/23 0456 10/15/23 0719 10/15/23 1818 10/16/23 0620  WBC 10.1  --   --  8.4  HGB 6.8*   < > 10.8* 10.1*  HCT 20.7*   < > 30.0* 29.0*  PLT 135*  --   --  128*   < > = values in this interval not displayed.   BMET Recent Labs    10/15/23 0456 10/16/23 0620  NA 132* 130*  K 3.5 3.8  CL 101 96*  CO2 24 28  GLUCOSE 122* 108*  BUN 23 28*  CREATININE 1.63* 1.56*  CALCIUM 8.2* 7.8*   PT/INR No results for input(s): "LABPROT", "INR" in the last 72 hours.  Studies/Results: No results  found.  Anti-infectives: Anti-infectives (From admission, onward)    Start     Dose/Rate Route Frequency Ordered Stop   10/13/23 1200  cefoTEtan  (CEFOTAN ) 2 g in sodium chloride  0.9 % 100 mL IVPB        2 g 200 mL/hr over 30 Minutes Intravenous On call to O.R. 10/12/23 0836 10/13/23 1401   10/13/23 1128  sodium chloride  0.9 % with cefoTEtan  (CEFOTAN ) ADS Med       Note to Pharmacy: Gabino Joe S: cabinet override      10/13/23 1128 10/13/23 1357   10/12/23 1300  metroNIDAZOLE  (FLAGYL ) tablet 500 mg        500 mg Oral 3 times daily 10/11/23 1212 10/12/23 2116   10/12/23 1300  neomycin  (MYCIFRADIN ) tablet 1,000 mg        1,000 mg Oral 3 times daily 10/11/23 1212 10/12/23 2136   10/11/23 1300  neomycin  (MYCIFRADIN ) tablet 1,000 mg  Status:  Discontinued        1,000 mg Oral 3 times daily 10/11/23 0839 10/11/23 1212   10/11/23 1300  metroNIDAZOLE  (FLAGYL ) tablet 500 mg  Status:  Discontinued        500 mg Oral 3 times daily 10/11/23 0839 10/11/23 1212   10/07/23 1500  piperacillin -tazobactam (ZOSYN ) IVPB 3.375 g        3.375 g 12.5 mL/hr over 240 Minutes Intravenous Every 8 hours 10/07/23 1356  Assessment/Plan:  Patient is a 78 year old female who was 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  She is status post Hartman's procedure with small bowel resection and ileocecectomy on 5/16.   - Continue IV Zosyn  for 5 days postoperatively - Gram stain from surgery with no organisms noted.  Culture with rare yeast, await final results - Continue NPO, NG to LIS until patient with return of bowel function - Continue TPN - Okay to clamp NG tube for medication administration - Monitor for return of bowel function - Appreciate ostomy nurse recommendations - JP drain care.  Surrounding dressing may need to be changed multiple times throughout the day and evening.  Also discussed importance of stripping the drain - Okay for Foley  removal from a surgical standpoint whenever the hospitalist deems the Foley is no longer necessary - Hemoglobin stable this morning, 10.1 after receiving 2 units of PRBCs yesterday.  Continue to monitor with daily labs - Would hold heparin  drip currently - Appreciate hospitalist and consultants recommendations   LOS: 9 days    Kyanne Rials A Marquette Blodgett 10/16/2023  Note: Portions of this report may have been transcribed using voice recognition software. Every effort has been made to ensure accuracy; however, inadvertent computerized transcription errors may still be present.

## 2023-10-17 DIAGNOSIS — K5720 Diverticulitis of large intestine with perforation and abscess without bleeding: Secondary | ICD-10-CM | POA: Diagnosis not present

## 2023-10-17 LAB — COMPREHENSIVE METABOLIC PANEL WITH GFR
ALT: 11 U/L (ref 0–44)
AST: 14 U/L — ABNORMAL LOW (ref 15–41)
Albumin: 2.3 g/dL — ABNORMAL LOW (ref 3.5–5.0)
Alkaline Phosphatase: 33 U/L — ABNORMAL LOW (ref 38–126)
Anion gap: 4 — ABNORMAL LOW (ref 5–15)
BUN: 33 mg/dL — ABNORMAL HIGH (ref 8–23)
CO2: 32 mmol/L (ref 22–32)
Calcium: 8 mg/dL — ABNORMAL LOW (ref 8.9–10.3)
Chloride: 96 mmol/L — ABNORMAL LOW (ref 98–111)
Creatinine, Ser: 1.36 mg/dL — ABNORMAL HIGH (ref 0.44–1.00)
GFR, Estimated: 40 mL/min — ABNORMAL LOW (ref >60)
Glucose, Bld: 123 mg/dL — ABNORMAL HIGH (ref 70–99)
Potassium: 3.8 mmol/L (ref 3.5–5.1)
Sodium: 132 mmol/L — ABNORMAL LOW (ref 135–145)
Total Bilirubin: 0.5 mg/dL (ref 0.0–1.2)
Total Protein: 4.8 g/dL — ABNORMAL LOW (ref 6.5–8.1)

## 2023-10-17 LAB — CBC
HCT: 32.4 % — ABNORMAL LOW (ref 36.0–46.0)
Hemoglobin: 10.9 g/dL — ABNORMAL LOW (ref 12.0–15.0)
MCH: 30.1 pg (ref 26.0–34.0)
MCHC: 33.6 g/dL (ref 30.0–36.0)
MCV: 89.5 fL (ref 80.0–100.0)
Platelets: 131 10*3/uL — ABNORMAL LOW (ref 150–400)
RBC: 3.62 MIL/uL — ABNORMAL LOW (ref 3.87–5.11)
RDW: 16.5 % — ABNORMAL HIGH (ref 11.5–15.5)
WBC: 7.5 10*3/uL (ref 4.0–10.5)
nRBC: 0 % (ref 0.0–0.2)

## 2023-10-17 LAB — SURGICAL PATHOLOGY

## 2023-10-17 LAB — GLUCOSE, CAPILLARY
Glucose-Capillary: 140 mg/dL — ABNORMAL HIGH (ref 70–99)
Glucose-Capillary: 141 mg/dL — ABNORMAL HIGH (ref 70–99)
Glucose-Capillary: 153 mg/dL — ABNORMAL HIGH (ref 70–99)

## 2023-10-17 LAB — TRIGLYCERIDES: Triglycerides: 122 mg/dL (ref <150)

## 2023-10-17 LAB — MAGNESIUM: Magnesium: 1.9 mg/dL (ref 1.7–2.4)

## 2023-10-17 LAB — HEPARIN LEVEL (UNFRACTIONATED): Heparin Unfractionated: 0.12 [IU]/mL — ABNORMAL LOW (ref 0.30–0.70)

## 2023-10-17 LAB — PHOSPHORUS: Phosphorus: 2.4 mg/dL — ABNORMAL LOW (ref 2.5–4.6)

## 2023-10-17 MED ORDER — TRAVASOL 10 % IV SOLN
INTRAVENOUS | Status: AC
  Filled 2023-10-17: qty 900

## 2023-10-17 MED ORDER — HEPARIN (PORCINE) 25000 UT/250ML-% IV SOLN
950.0000 [IU]/h | INTRAVENOUS | Status: DC
  Administered 2023-10-17: 550 [IU]/h via INTRAVENOUS
  Filled 2023-10-17 (×2): qty 250

## 2023-10-17 MED ORDER — AMIODARONE IV BOLUS ONLY 150 MG/100ML
150.0000 mg | Freq: Once | INTRAVENOUS | Status: AC
  Administered 2023-10-17: 150 mg via INTRAVENOUS
  Filled 2023-10-17: qty 100

## 2023-10-17 NOTE — Consult Note (Addendum)
 Cardiology Consultation   Patient ID: Felicia Frank MRN: [id number]; DOB: [birth year]  Admit date: 10/07/2023 Date of Consult: 10/17/2023  PCP:  Allana Ishikawa, MD   [facility] Providers Cardiologist:  None      New consult completed by Dr Amanda Jungling  Patient Profile:   Felicia Frank is a 78 y.o. female with a hx of hypothyroidism, primary hypertension, chronic kidney disease stage IIIa, class I obesity, paroxysmal atrial fibrillation with recent hospitalization secondary to diverticulitis with perforation and contained abscess status post drain placement and internal fistula formation,  who is being seen 10/17/2023 for the evaluation of atrial fibrillation with RVR at the request of Dr Mason Sole.  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  CT abdomen pelvis with positive for acute sigmoid diverticulitis with Khalia-enteric fistula.  New superior endplate compression fracture of L1.  Patient was placed on IV fluids and IV antibiotics.  NG tube was placed for decompression.  Surgery was consulted.  On/20/25 she got into atrial fibrillation with RVR which was new onset.  She was treated with diltiazem  drip.  Serial EKGs continue to reveal atrial fibrillation with RVR.  She remained tachycardic with heart rates 150s to 170s.  She ended up being transferred to stepdown for continuous IV diltiazem  infusion.  She was treated with magnesium , Lokelma  for elevated potassium send.  Repeat CT of the abdomen pelvis revealed gas collection/fluid measuring 6 x 4.4 cm.  On 4/23 interventional radiology was consulted and she had a drain placed without immediate complications.  4/28 CT of the abdomen pelvis and a right transgluteal percutaneous drain has evacuated the fluid component of central pelvic diverticular abscess.  Pocket of air anterior to the drainage catheter remain remained  roughly 3.4 cm in diameter.  No abscess.  Stable dilated small bowel loops containing fluid measuring up to 3.5 cm.  On 4/30 she had bowel movements tolerating diet was stable for discharge from the general surgery standpoint to remain on antibiotics for 1 more week.  Okay to start on apixaban  for anticoagulation given new diagnoses of atrial fibrillation during hospitalization.  No other acute events or concerns were noted.  She returned to [facility] emergency department on 10/07/2023 with complaints of abdominal pain.  She had previously just been discharged home with antibiotic therapy.  Stated that her pain and never improved.  She did have drain placed by interventional radiology.  She had previously been seen at [facility] 2 days prior at that time she was diagnosed with UTI was placed on Macrobid and was also told that she had a yeast infection.  Unfortunately she was unable to tolerate any p.o. intake in the past several days.  She was scheduled for follow-up with Dr. Larrie Po within the next couple of days.  She had continued with persistent abdominal pain, nausea vomiting, increased weakness, presented with dehydration, acute kidney injury and no improvement since last admission.  She denied any chest pain, palpitations, or chest tightness.  She did have some occasional associated shortness of breath and swelling to her bilateral lower extremities.  Initial vital signs revealed a blood pressure 133/52, pulse was 66, temperature of 97.6, respirations of 18  Pertinent labs revealed WBCs 11.2, hemoglobin 10.6, hematocrit 33.5, sodium of 134, potassium 2.9, blood glucose 128, serum creatinine 1.91, calcium 8.2, albumin  of 2.7  Imaging: CT of the abdomen pelvis revealed mild diffuse gaseous distention of  central loops of the small bowel with transition point in the pelvis, just proximal to the terminal ileum suggestive of partial obstruction, there is small air-fluid containing collection in  the central pelvis, adjacent to previous drainage catheter, which measured 3.9 x 3.6 cm, large volume of liquid stool material was present in the right and transverse colon, persistent nephrograms which can be observed in the setting of renal dysfunction; abdominal flatplate revealed diffuse gaseous distention of the colon and small bowel likely ileus; chest x-ray revealed right PICC with tip overlying the right atrium, likely in appropriate position along the superior cavoatrial junction given low lung volumes  Medications administered in the emergency department: Zosyn  3.375 g IVPB, fentanyl  50 mcg IVP, Zofran  4 mg IVP, sodium chloride  1 L bolus, and potassium chloride  10 mEq IVPB  On 10/13/2023 she underwent partial colectomy with end colostomy, ileocecectomy, partial small bowel resection.  She did receive 2 units of packed RBCs intraoperatively.  No immediate complications were documented.  She was continued on IV Zosyn , continue with n.p.o. with NG tube to low intermittent suction until return of bowel function.  TPN was started in addition to Lasix  given for poor oral nutritional status and volume overload.  Hemoglobin 8.9 postop day 1.  Patient continued since surgery 10 pounds of elevated heart rate was continued on amiodarone  infusion and IV Lopressor .  Heparin  infusion remains on hold per general surgery until hemoglobin level stabilized.  She did have worsening anemia on 5/17 and was given 2 units of PRBCs.  On 10/16/2023 she had bouts of elevated heart rates with activity having moved out of bed.  Continued on amiodarone  and metoprolol .  About/19/25 showed atrial fibrillation with RVR heart rates up to 140s and 150s on amiodarone  infusion.  Cardiology was consulted this morning for concerns of atrial fibrillation with RVR.  Past Medical History:  Diagnosis Date   Diverticulosis    HTN (hypertension)    Hypothyroidism     Past Surgical History:  Procedure Laterality Date   ABDOMINAL  HYSTERECTOMY     BACK SURGERY     CHOLECYSTECTOMY     COLECTOMY WITH COLOSTOMY CREATION/HARTMANN PROCEDURE N/A 10/13/2023   Procedure: PARTIAL COLECTOMY, WITH COLOSTOMY CREATION, PARTIAL SMALL BOWEL RESECTION, ILEOCECECTOMY;  Surgeon: [name], MD;  Location: [facility];  Service: General;  Laterality: N/A;   TONSILLECTOMY       Home Medications:  Prior to Admission medications   Medication Sig Start Date End Date Taking? Authorizing Provider  amiodarone  (PACERONE ) 200 MG tablet Take 1 tablet (200 mg total) by mouth daily. 09/29/23 11/28/23 Yes Shah, Pratik D, DO  apixaban  (ELIQUIS ) 5 MG TABS tablet Take 1 tablet (5 mg total) by mouth 2 (two) times daily. 09/28/23  Yes Shah, Pratik D, DO  doxazosin  (CARDURA ) 8 MG tablet Take 8 mg by mouth at bedtime. 12/02/20  Yes [provider]  levothyroxine  (SYNTHROID ) 50 MCG tablet Take 50 mcg by mouth daily. 03/15/17  Yes [provider]  metoprolol  tartrate (LOPRESSOR ) 50 MG tablet Take 1 tablet (50 mg total) by mouth 2 (two) times daily. 09/28/23 11/27/23 Yes Shah, Pratik D, DO  ondansetron  (ZOFRAN ) 4 MG tablet Take 1 tablet (4 mg total) by mouth daily as needed for nausea or vomiting. 09/28/23 09/27/24 Yes Shah, Pratik D, DO  senna-docusate (SENOKOT-S) 8.6-50 MG tablet Take 2 tablets by mouth 2 (two) times daily. 09/28/23 10/28/23 Yes Shah, Pratik D, DO  traMADol  (ULTRAM ) 50 MG tablet Take 1 tablet (50 mg total) by  mouth every 12 (twelve) hours as needed. 09/28/23  Yes [name], MD  zolpidem  (AMBIEN ) 10 MG tablet Take 10 mg by mouth at bedtime as needed for sleep. 12/11/20  Yes [provider]  estradiol (ESTRACE) 0.1 MG/GM vaginal cream Place 0.5 g vaginally as needed (vaginal dryness/irritation). Twice a week PRN 11/06/12   [provider]  furosemide  (LASIX ) 20 MG tablet Take 1 tablet every day by oral route for 30 days. Patient not taking: Reported on 10/07/2023 09/30/23   [provider]    Inpatient  Medications: Scheduled Meds:  sodium chloride    Intravenous Once   acetaminophen   1,000 mg Oral Q6H   Chlorhexidine  Gluconate Cloth  6 each Topical Daily   insulin  aspart  0-15 Units Subcutaneous Q8H   levothyroxine   50 mcg Oral Daily   metoprolol  tartrate  5 mg Intravenous Q6H   pantoprazole  (PROTONIX ) IV  40 mg Intravenous QHS   sodium chloride  flush  10-40 mL Intracatheter Q12H   Continuous Infusions:  amiodarone  30 mg/hr (10/17/23 1040)   piperacillin -tazobactam (ZOSYN )  IV 3.375 g (10/17/23 0612)   TPN ADULT (ION) 75 mL/hr at 10/16/23 1842   TPN ADULT (ION)     PRN Meds: HYDROmorphone  (DILAUDID ) injection, ondansetron  **OR** ondansetron  (ZOFRAN ) IV, oxyCODONE , prochlorperazine , sodium chloride  flush  Allergies:    Allergies  Allergen Reactions   Amlodipine Swelling   Clonidine Rash    Rash with patch only.  Okay to take pill    Social History:   Social History   Socioeconomic History   Marital status: Married    Spouse name: Not on file   Number of children: Not on file   Years of education: Not on file   Highest education level: Not on file  Occupational History   Not on file  Tobacco Use   Smoking status: Never   Smokeless tobacco: Never  Vaping Use   Vaping status: Never Used  Substance and Sexual Activity   Alcohol use: Never   Drug use: Never   Sexual activity: Not on file  Other Topics Concern   Not on file  Social History Narrative   Not on file   Social Drivers of Health   Financial Resource Strain: Not on file  Food Insecurity: No Food Insecurity (10/08/2023)   Hunger Vital Sign    Worried About Running Out of Food in the Last Year: Never true    Ran Out of Food in the Last Year: Never true  Transportation Needs: No Transportation Needs (10/08/2023)   PRAPARE - Administrator, Civil Service (Medical): No    Lack of Transportation (Non-Medical): No  Physical Activity: Not on file  Stress: Not on file  Social Connections: Socially  Integrated (10/08/2023)   Social Connection and Isolation Panel [NHANES]    Frequency of Communication with Friends and Family: More than three times a week    Frequency of Social Gatherings with Friends and Family: More than three times a week    Attends Religious Services: More than 4 times per year    Active Member of [employer] or Organizations: Yes    Attends Banker Meetings: More than 4 times per year    Marital Status: Married  Catering manager Violence: Not At Risk (10/08/2023)   Humiliation, Afraid, Rape, and Kick questionnaire    Fear of Current or Ex-Partner: No    Emotionally Abused: No    Physically Abused: No    Sexually Abused: No  Family History:    Family History  Problem Relation Age of Onset   Colon cancer Neg Hx    Colon polyps Neg Hx      ROS:  Please see the history of present illness.  Review of Systems  Constitutional:  Positive for malaise/fatigue and weight loss.  Eyes:  Positive for blurred vision.  Respiratory:  Positive for shortness of breath.   Cardiovascular:  Positive for leg swelling.  Gastrointestinal:  Positive for abdominal pain, nausea and vomiting.  Neurological:  Positive for weakness.     All other ROS reviewed and negative.     Physical Exam/Data:   Vitals:   10/17/23 0900 10/17/23 1000 10/17/23 1100 10/17/23 1141  BP:   121/73   Pulse: 66 (!) 141 (!) 124   Resp: 14 20 19    Temp:    97.9 F (36.6 C)  TempSrc:    Axillary  SpO2: 97% 98% 98%   Weight:      Height:        Intake/Output Summary (Last 24 hours) at 10/17/2023 1202 Last data filed at 10/17/2023 8295 Gross per 24 hour  Intake 949.77 ml  Output 6015 ml  Net -5065.23 ml      10/17/2023    5:00 AM 10/16/2023    5:00 AM 10/15/2023    7:02 AM  Last 3 Weights  Weight (lbs) 192 lb 10.9 oz 193 lb 2 oz 214 lb 11.7 oz  Weight (kg) 87.4 kg 87.6 kg 97.4 kg     Body mass index is 33.07 kg/m.  General: Frail chronically ill-appearing, in no acute distress,  pale HEENT: normal Neck: unable to determine JVD due to positioning Vascular: No carotid bruits; Distal pulses 2+ bilaterally Cardiac:  normal S1, S2; RRR; no murmur  Lungs:  clear to auscultation bilaterally, no wheezing, rhonchi or rales  Abd: soft, tender, no hepatomegaly, JP drain and ostomy intact Ext:  2+ edema to the bilateral upper and lower extremities Musculoskeletal:  No deformities, BUE and BLE strength normal and equal Skin: warm and dry  Neuro:  CNs 2-12 intact, no focal abnormalities noted Psych:  Flat affect   EKG:  The EKG was personally reviewed and demonstrates: Sinus rhythm at a rate of 68, large quantity of artifact, no acute change on 10/07/2023 Telemetry:  Telemetry was personally reviewed and demonstrates: Atrial fibrillation with RVR rates 130, converted at 0944 on telemetry, upon walking into the patient's room she had converted to normal sinus with a rate of 82  Relevant CV Studies: 2D echo 09/21/2023 1. Left ventricular ejection fraction, by estimation, is 65 to 70%. The  left ventricle has normal function. The left ventricle has no regional  wall motion abnormalities. Left ventricular diastolic parameters are  indeterminate.   2. Right ventricular systolic function is normal. The right ventricular  size is normal. Tricuspid regurgitation signal is inadequate for assessing  PA pressure.   3. The mitral valve is normal in structure. No evidence of mitral valve  regurgitation. No evidence of mitral stenosis.   4. The aortic valve is tricuspid. Aortic valve regurgitation is not  visualized. No aortic stenosis is present.    Laboratory Data:  High Sensitivity Troponin:  No results for input(s): "TROPONINIHS" in the last 720 hours.   Chemistry Recent Labs  Lab 10/15/23 0456 10/16/23 0620 10/17/23 0407  NA 132* 130* 132*  K 3.5 3.8 3.8  CL 101 96* 96*  CO2 24 28 32  GLUCOSE 122* 108* 123*  BUN 23 28* 33*  CREATININE 1.63* 1.56* 1.36*  CALCIUM 8.2*  7.8* 8.0*  MG 1.9 1.6* 1.9  GFRNONAA 32* 34* 40*  ANIONGAP 7 6 4*    Recent Labs  Lab 10/14/23 0443 10/15/23 0456 10/17/23 0407  PROT 3.8* 4.5* 4.8*  ALBUMIN  1.9* 2.8* 2.3*  AST 18 14* 14*  ALT 14 12 11   ALKPHOS 29* 23* 33*  BILITOT 0.5 0.3 0.5   Lipids  Recent Labs  Lab 10/17/23 0407  TRIG 122    Hematology Recent Labs  Lab 10/15/23 0456 10/15/23 0719 10/15/23 1818 10/16/23 0620 10/17/23 0407  WBC 10.1  --   --  8.4 7.5  RBC 2.30*  --   --  3.28* 3.62*  HGB 6.8*   < > 10.8* 10.1* 10.9*  HCT 20.7*   < > 30.0* 29.0* 32.4*  MCV 90.0  --   --  88.4 89.5  MCH 29.6  --   --  30.8 30.1  MCHC 32.9  --   --  34.8 33.6  RDW 17.5*  --   --  16.6* 16.5*  PLT 135*  --   --  128* 131*   < > = values in this interval not displayed.   Thyroid No results for input(s): "TSH", "FREET4" in the last 168 hours.  BNPNo results for input(s): "BNP", "PROBNP" in the last 168 hours.  DDimer No results for input(s): "DDIMER" in the last 168 hours.   Radiology/Studies:  No results found.   Assessment and Plan:   Atrial fibrillation with RVR with a history of paroxysmal atrial fibrillation -With several episodes of RVR -Sinus rhythm on telemetry upon walking in patient's room this morning -Likely exacerbated from anemia and fluid overload during surgery, and moving from the bed to the chair -Patient diagnosed with atrial fibrillation on previous admission April 2025 -Currently on IV amiodarone  infusion -Given additional amiodarone  bolus this morning for elevated heart rates of 130-150 bpm -She is continued on metoprolol  5 mg IV push every 6 hours -Heparin  infusion continues to remain on hold for CHA2DS2-VASc score of at least 4 for stroke prophylaxis as hemoglobin has just improved status post multiple transfusions - Restart heparin  infusion once stable per general surgery -TSH 4.316 -Commend keeping potassium closer to 4 less than 5 magnesium  2 - Transition IV metoprolol  back to  p.o. metoprolol  once tolerating oral medications -Not ideal candidate for digoxin with elevated serum creatinine - Continue on telemetry monitoring  Diverticulitis of the colon with perforation status post partial resection -Recently underwent small bowel resection, ileocecectomy, and colostomy, on 5/15 -Continued on current IV antibiotics, TPN -Currently remains n.p.o. with NG tube intact - -4.1 L output since admission -Patient has undergone aggressive diuresis since surgery, furosemide  previously placed on hold -Supportive care -Ongoing management per IM and general surgery  Postoperative anemia -Hemoglobin 10.9 -Continued improvement after blood transfusion -Monitor for signs of active bleeding as she is postoperative -Daily CBC -Recommend keeping hemoglobin greater than 8  AKI -Serum creatinine 1.36 -Baseline serum creatinine -Monitor urine output -Monitor/trend/replete electrolytes as needed -Daily BMP -Avoid nephrotoxic agents were able  Primary hypertension -Blood pressure 121/73 - Continued on metoprolol  tartrate 5 mg IV every 6 hours and amiodarone  infusion - Vital signs per unit protocol  Hypothyroidism -TSH 4.316 - Restart levothyroxine  when taking oral medications - Ongoing management per IM    Risk Assessment/Risk Scores:          CHA2DS2-VASc Score = 4   This indicates a  4.8% annual risk of stroke. The patient's score is based upon: CHF History: 0 HTN History: 1 Diabetes History: 0 Stroke History: 0 Vascular Disease History: 0 Age Score: 2 Gender Score: 1         For questions or updates, please contact [facility] Please consult [email or web address] for contact info under    Signed, SHERI HAMMOCK, NP  10/17/2023 12:02 PM  Attending Note     Patient seen and discussed with NP Hammock, I agree with her documentation. 78 yo female history of HTN, CKD IIIA, hyothyroidism, PAF, diverticulitis with prior peforation and abscess managed  with drain, admitted  10/07/23 with abdominal pain, nausea, vomiting. Managed for diverticulitis again this admission with perforation and enterocolonic fistula, s/p Hartman's procedure with small bowel resection and ileocecetomy 5/15, on IV abx per primary team. In this setting recurrent issues with afib with RVR.    Afib was new diagnosis during 08/2023 admission with perforated diverticulitis. Managed by primary team with IV cardizem  and amiodarone , eventually transitioned to oral amio.        Mg 1.9 K 3.8 BUN 33 Cr 1.36 WBC 7.5 Hgb 10.9 Plt 131   08/2021 echo: LVEF 65-70%, no WMAs, normal RV       1.PAF - Afib was new diagnosis during 08/2023 admission with perforated diverticulitis. Managed my medicine team that admission. Discharged and home regimen was amio 200mg  daily, lopressor  50mg  bid. Anticoag not started in case surgery was needed in near future for bowel perforation   - recurrent issues with afib with RVR in setting of ongoing bowel perforation, she had surgery 5/15 and remains on antiobiotics for this. Has been NPO, on IV amio gtt this admission. Has been in and out of afib through admission.  - recurrent RVR this morning, rebolused amio 150mg  and continued on drip. She is also lopressor  5mg  every 6 hours.  - not on anticoag with recent abdominal surgery   - continue amio gtt, IV lopressor . Ongoing severe systemic stress from surgery and infection, as systemic illness resolves drive for tachycardia will decrease. When able to take PO transition to oral regimen. BP's are fine, if needed an additional PRN agent could give 10mg  of IV diltiazem .  - just got the amio bolus 45 min ago, due for repeat lopressor  at noon. Monitor rates, if ongong tachycardia later in the afternoon would given 10mg  of IV diltiazem  depending on bp.  - keep K at 4, Mg at 2.      Armida Lander MD

## 2023-10-17 NOTE — Progress Notes (Signed)
 PHARMACY - ANTICOAGULATION CONSULT NOTE  Pharmacy Consult for heparin  Indication: atrial fibrillation  Allergies  Allergen Reactions   Amlodipine Swelling   Clonidine Rash    Rash with patch only.  Okay to take pill    Patient Measurements: Height: 5\' 4"  (162.6 cm) Weight: 87.4 kg (192 lb 10.9 oz) IBW/kg (Calculated) : 54.7 HEPARIN  DW (KG): 73.5  Vital Signs: Temp: 97.9 F (36.6 C) (05/19 1141) Temp Source: Axillary (05/19 1141) BP: 121/73 (05/19 1100) Pulse Rate: 124 (05/19 1100)  Labs: Recent Labs    10/15/23 0456 10/15/23 0719 10/15/23 1818 10/16/23 0620 10/17/23 0407  HGB 6.8*   < > 10.8* 10.1* 10.9*  HCT 20.7*   < > 30.0* 29.0* 32.4*  PLT 135*  --   --  128* 131*  CREATININE 1.63*  --   --  1.56* 1.36*   < > = values in this interval not displayed.    Estimated Creatinine Clearance: 36.5 mL/min (A) (by C-G formula based on SCr of 1.36 mg/dL (H)).   Medical History: Past Medical History:  Diagnosis Date   Diverticulosis    HTN (hypertension)    Hypothyroidism     Medications:  Medications Prior to Admission  Medication Sig Dispense Refill Last Dose/Taking   amiodarone  (PACERONE ) 200 MG tablet Take 1 tablet (200 mg total) by mouth daily. 30 tablet 1 10/06/2023   apixaban  (ELIQUIS ) 5 MG TABS tablet Take 1 tablet (5 mg total) by mouth 2 (two) times daily. 60 tablet 2 10/06/2023 at  8:30 PM   doxazosin  (CARDURA ) 8 MG tablet Take 8 mg by mouth at bedtime.   10/06/2023 Bedtime   levothyroxine  (SYNTHROID ) 50 MCG tablet Take 50 mcg by mouth daily.   10/06/2023 Morning   metoprolol  tartrate (LOPRESSOR ) 50 MG tablet Take 1 tablet (50 mg total) by mouth 2 (two) times daily. 60 tablet 1 10/06/2023 Evening   ondansetron  (ZOFRAN ) 4 MG tablet Take 1 tablet (4 mg total) by mouth daily as needed for nausea or vomiting. 30 tablet 1 Taking As Needed   senna-docusate (SENOKOT-S) 8.6-50 MG tablet Take 2 tablets by mouth 2 (two) times daily. 120 tablet 0 10/06/2023 Evening   traMADol   (ULTRAM ) 50 MG tablet Take 1 tablet (50 mg total) by mouth every 12 (twelve) hours as needed. 15 tablet 0 Taking As Needed   zolpidem  (AMBIEN ) 10 MG tablet Take 10 mg by mouth at bedtime as needed for sleep.   10/06/2023 Bedtime   estradiol (ESTRACE) 0.1 MG/GM vaginal cream Place 0.5 g vaginally as needed (vaginal dryness/irritation). Twice a week PRN      furosemide  (LASIX ) 20 MG tablet Take 1 tablet every day by oral route for 30 days. (Patient not taking: Reported on 10/07/2023)   Not Taking    Assessment: Pharmacy consulted to dose heparin  in patient with atrial fibrillation.  Patient is on Eliquis  prior to admission with last dose 5/8- anticoagulation has been held following procedure per surgery.  Heparin  level previously therapeutic on 550 units/hr.  Goal of Therapy:  Heparin  level 0.3-0.7 units/ml Monitor platelets by anticoagulation protocol: Yes   Plan:  Start heparin  infusion at 550 units/hr Check anti-Xa level in 8 hours and daily while on heparin  Continue to monitor H&H and platelets  Cliffton Dama, PharmD Clinical Pharmacist 10/17/2023 12:35 PM

## 2023-10-17 NOTE — TOC Progression Note (Signed)
 Transition of Care [facility]) - Progression Note    Patient Details  Name: Felicia Frank MRN: [id number] Date of Birth: 03/13/1946  Transition of Care [facility]) CM/SW Contact  [place] Lax, [place] Phone Number: 10/17/2023, 11:22 AM  Clinical Narrative:    CSW spoke with pt at bedside to review bed offer from [facility], pt accepts bed offer. CSW updated [name] with admissions of bed acceptance. Insurance [employer] will be started closer to pt being medically stable. TOC to follow.   Expected Discharge Plan: Home w Home Health Services Barriers to Discharge: Continued Medical Work up  Expected Discharge Plan and Services In-house Referral: Clinical Social Work Discharge Planning Services: CM Consult Post Acute Care Choice: Home Health Living arrangements for the past 2 months: Single Family Home                                       Social Determinants of Health (SDOH) Interventions SDOH Screenings   Food Insecurity: No Food Insecurity (10/08/2023)  Housing: Low Risk  (10/08/2023)  Transportation Needs: No Transportation Needs (10/08/2023)  Utilities: Not At Risk (10/08/2023)  Social Connections: Socially Integrated (10/08/2023)  Tobacco Use: Low Risk  (10/13/2023)    Readmission Risk Interventions    10/10/2023   11:26 AM 10/07/2023   10:06 PM 09/16/2023   10:04 PM  Readmission Risk Prevention Plan  Post Dischage Appt   Complete  Medication Screening   Complete  Transportation Screening Complete Complete Complete  PCP or Specialist Appt within 5-7 Days  Complete   Home Care Screening  Complete   Medication Review (RN CM)  Complete   HRI or Home Care Consult Complete    Social Work Consult for Recovery Care Planning/Counseling Complete    Palliative Care Screening Not Applicable    Medication Review Oceanographer) Complete

## 2023-10-17 NOTE — Progress Notes (Signed)
 PHARMACY - TOTAL PARENTERAL NUTRITION CONSULT NOTE   Indication:  S/p partial colectomy, ileocecectomy, partial resection.  Patient Measurements: Height: 5\' 4"  (162.6 cm) Weight: 87.4 kg (192 lb 10.9 oz) IBW/kg (Calculated) : 54.7 TPN AdjBW (KG): 62.4 Body mass index is 33.07 kg/m. Usual Weight: Unknown  Assessment: 78 year old female hx Afib, CKD, obesity admitted with perforated sigmoid diverticulitis now POD 2 partial colectomy with end colostomy, ileocecectomy, partial small bowel resection. At risk for refeeding d/t minimal intake since admission.  Glucose / Insulin : No prior Hx DM, CBGs 123-153. 6 units given in past 24 hours  Electrolytes: Na 132, K 3.8, Mag 1.6. Corrected Ca 9.4 Phos 2.4  Renal: Hx CKD, Scr 1.36 Hepatic: WNL  GI Surgeries / Procedures:  5/15 partial colectomy/small bowel resection  Central access: Double lumen PICC since 5/11 TPN start date: 5/16  Nutritional Goals: Goal TPN rate is 75 ml/hr over 24 hours (provides 90 g of protein and 1800 kcals per day)   RD Assessment: Estimated Needs Total Energy Estimated Needs: 1700-1900 Total Protein Estimated Needs: 90-110 gm Total Fluid Estimated Needs: 1.7-1.9 L  Current Nutrition:  NPO. 100% nutritional needs from TPN  Plan:  Continue TPN to goal 75 mL/hr at 1800 Electrolytes in TPN: Na 75 mEq/L, K 69mEq/L, Ca 35mEq/L, Mg 8 mEq/L, and Phos 63mmol/L.  Cl:Ac 2:1 Add standard MVI and trace elements to TPN Add Thiamine 100mg  to TPN Continue Moderate q8h SSI and adjust as needed  Monitor TPN labs on Mon/Thurs  Cliffton Dama, PharmD Clinical Pharmacist 10/17/2023 8:57 AM

## 2023-10-17 NOTE — Progress Notes (Signed)
 [facility] Progress Note  4 Days Post-Op  Subjective: Patient seen and examined.  She is sitting in the chair at the side of the bed.  She overall is doing well.  She is concerned about how she will handle the ostomy at the time of discharge.  Her pain is well-controlled.  NG tube is had minimal output.  She is unsure if her ostomy has had any output.  Objective: Vital signs in last 24 hours: Temp:  [97.9 F (36.6 C)-98.4 F (36.9 C)] 97.9 F (36.6 C) (05/19 1141) Pulse Rate:  [57-141] 124 (05/19 1100) Resp:  [12-21] 19 (05/19 1100) BP: (121-155)/(50-90) 121/73 (05/19 1100) SpO2:  [91 %-98 %] 98 % (05/19 1100) Weight:  [87.4 kg] 87.4 kg (05/19 0500) Last BM Date : 10/16/23  Intake/Output from previous day: 05/18 0701 - 05/19 0700 In: 949.8 [I.V.:764.2; IV Piggyback:185.5] Out: 6075 [Urine:5700; Emesis/NG output:200; Drains:145; Stool:30] Intake/Output this shift: No intake/output data recorded.  General appearance: alert, cooperative, and no distress GI: Abdomen soft, nondistended, no percussion tenderness, incisional and right sided abdominal tenderness to palpation; no rigidity, guarding, rebound tenderness; midline incision C/D/I with honeycomb dressing in place, left-sided ostomy hyperemic and patent with gas and liquid stool in bag, no evidence of active bleeding; JP drain in right lower quadrant with serosanguineous output  Lab Results:  Recent Labs    10/16/23 0620 10/17/23 0407  WBC 8.4 7.5  HGB 10.1* 10.9*  HCT 29.0* 32.4*  PLT 128* 131*   BMET Recent Labs    10/16/23 0620 10/17/23 0407  NA 130* 132*  K 3.8 3.8  CL 96* 96*  CO2 28 32  GLUCOSE 108* 123*  BUN 28* 33*  CREATININE 1.56* 1.36*  CALCIUM 7.8* 8.0*   PT/INR No results for input(s): "LABPROT", "INR" in the last 72 hours.  Studies/Results: No results found.  Anti-infectives: Anti-infectives (From admission, onward)    Start     Dose/Rate Route Frequency Ordered Stop    10/13/23 1200  cefoTEtan  (CEFOTAN ) 2 g in sodium chloride  0.9 % 100 mL IVPB        2 g 200 mL/hr over 30 Minutes Intravenous On call to O.R. 10/12/23 0836 10/13/23 1401   10/13/23 1128  sodium chloride  0.9 % with cefoTEtan  (CEFOTAN ) ADS Med       Note to Pharmacy: Gabino Joe S: cabinet override      10/13/23 1128 10/13/23 1357   10/12/23 1300  metroNIDAZOLE  (FLAGYL ) tablet 500 mg        500 mg Oral 3 times daily 10/11/23 1212 10/12/23 2116   10/12/23 1300  neomycin  (MYCIFRADIN ) tablet 1,000 mg        1,000 mg Oral 3 times daily 10/11/23 1212 10/12/23 2136   10/11/23 1300  neomycin  (MYCIFRADIN ) tablet 1,000 mg  Status:  Discontinued        1,000 mg Oral 3 times daily 10/11/23 0839 10/11/23 1212   10/11/23 1300  metroNIDAZOLE  (FLAGYL ) tablet 500 mg  Status:  Discontinued        500 mg Oral 3 times daily 10/11/23 0839 10/11/23 1212   10/07/23 1500  piperacillin -tazobactam (ZOSYN ) IVPB 3.375 g        3.375 g 12.5 mL/hr over 240 Minutes Intravenous Every 8 hours 10/07/23 1356         Assessment/Plan:  Patient is a 78 year old female who was 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  She is status post Hartman's procedure with small bowel resection  and ileocecectomy on 5/16.   - Continue IV Zosyn  for 5 days postoperatively - Gram stain from surgery with no organisms noted.  Culture with rare Candida albicans, await final results - Given some bowel function, will initiate clear liquids - Will clamp NG tube to see how patient tolerates clears.  Patient tolerates clears without nausea and vomiting, can plan for NG tube removal later today versus tomorrow - Continue TPN until patient tolerating solid food - Monitor for bowel function - Appreciate ostomy nurse recommendations - JP drain care - Okay for Foley removal from a surgical standpoint whenever the hospitalist deems the Foley is no longer necessary - Hemoglobin stable this  morning, 10.9 from 10.1.  Continue to monitor with daily labs - Ok to restart heparin  gtt today - Appreciate hospitalist and consultants recommendations   LOS: 10 days    Kaveh Kissinger A Bralee Feldt 10/17/2023  Note: Portions of this report may have been transcribed using voice recognition software. Every effort has been made to ensure accuracy; however, inadvertent computerized transcription errors may still be present.

## 2023-10-17 NOTE — Progress Notes (Signed)
 PROGRESS NOTE    Felicia Frank  QVZ:[id number] DOB: 12-Oct-1945 DOA: 10/07/2023 PCP: Allana Ishikawa, MD   Brief Narrative:    Felicia Frank is a 78 y.o. female with medical history significant of hypothyroidism, hypertension, chronic kidney disease stage IIIa, class I obesity, atrial fibrillation and recent hospitalization secondary to diverticulitis with perforation and contained abscess status post drain placement and enteral fistula formation; who presented to the hospital secondary to still ongoing abdominal pain, associated nausea/intermittent vomiting and difficulty keeping things down.  Patient has been admitted with diverticulitis of the colon with perforation and enterocolonic fistula.  She is status post Hartman's procedure with small bowel resection and ileocecectomy on 5/15.  Patient remains on TPN and Lasix  and continues to have some elevated heart rates requiring recurrent amiodarone  bolus and ongoing drip as well as IV metoprolol .  Cardiology consulted for assistance in management.  Assessment & Plan:   Principal Problem:   Diverticulitis of colon with perforation Active Problems:   Fistula of large intestine  Assessment and Plan:   diverticulitis of the colon with perforation and enterocolonic fistula status post Hartman's procedure with small bowel resection and ileocecectomy 5/15. -Continue current IV antibiotics with Zosyn  for 5 days postoperatively discontinue after final doses on 5/20 - Appreciate general surgery recommendations to continue n.p.o. status and start TPN - Started on Lasix  5/16 and -5.1 L fluid balance noted in the last 24 hours with stable creatinine levels with -7.8 L fluid balance since initiation of Lasix   -Okay to clamp NG tube for medication administration -Continue Foley catheter for diuresis with ongoing edema that has improved.  Hold tonight's dose of Lasix  given already aggressive diuresis with ongoing tachycardia   chronic paroxysmal  atrial fibrillation with RVR-poorly controlled - Will continue the use of adjusted dose IV metoprolol  and SR amiodarone  drip with loading dose repeated on 5/19 -Consult to cardiology for further assistance in management - Hold heparin  infusion for now per general surgery and until hemoglobin levels stabilize  Worsening anemia-improved and now stable after transfusion - Status post 2 unit PRBC transfusion 5/17 and currently stable -Continue to monitor CBC   essential hypertension - Continue to monitor while on aggressive diuresis   hypothyroidism - Continue Synthroid . - Taking p.o.   acute kidney injury in the setting of chronic kidney disease stage IIIa - Creatinine improving with diuresis - Will continue to maintain adequate hydration and follow renal function trend - Continue to minimize nephrotoxic agents. - Hold further diuresis for now given aggressiveness of diuresis in the last 24 hours   GERD - Continue PPI   compression fracture of L1 - Continue as needed analgesia - Will recommend continued use of TLSO brace at discharge.   class I obesity -Body mass index is 32.28 kg/m. - Low-calorie diet and portion control discussed with patient.    DVT prophylaxis: Heparin  drip currently held Code Status: Full Family Communication: Brother at bedside 5/16 Disposition Plan:  Status is: Inpatient Remains inpatient appropriate because: Need for IV medications and surgical procedure.  Consultants:  General Surgery Cardiology  Procedures:  Hartman's procedure with small bowel resection and ileocecectomy 5/16  Antimicrobials:  Anti-infectives (From admission, onward)    Start     Dose/Rate Route Frequency Ordered Stop   10/13/23 1200  cefoTEtan  (CEFOTAN ) 2 g in sodium chloride  0.9 % 100 mL IVPB        2 g 200 mL/hr over 30 Minutes Intravenous On call to O.R. 10/12/23 3295 10/13/23 1401  10/13/23 1128  sodium chloride  0.9 % with cefoTEtan  (CEFOTAN ) ADS Med       Note  to Pharmacy: Gabino Joe S: cabinet override      10/13/23 1128 10/13/23 1357   10/12/23 1300  metroNIDAZOLE  (FLAGYL ) tablet 500 mg        500 mg Oral 3 times daily 10/11/23 1212 10/12/23 2116   10/12/23 1300  neomycin  (MYCIFRADIN ) tablet 1,000 mg        1,000 mg Oral 3 times daily 10/11/23 1212 10/12/23 2136   10/11/23 1300  neomycin  (MYCIFRADIN ) tablet 1,000 mg  Status:  Discontinued        1,000 mg Oral 3 times daily 10/11/23 0839 10/11/23 1212   10/11/23 1300  metroNIDAZOLE  (FLAGYL ) tablet 500 mg  Status:  Discontinued        500 mg Oral 3 times daily 10/11/23 0839 10/11/23 1212   10/07/23 1500  piperacillin -tazobactam (ZOSYN ) IVPB 3.375 g        3.375 g 12.5 mL/hr over 240 Minutes Intravenous Every 8 hours 10/07/23 1356         Subjective: Patient seen and evaluated today with no new acute complaints or concerns.  Heart rates remain elevated this morning and she has had aggressive diuresis in the last 24 hours of over 5 L.  Edema to all 4 limbs have improved.  Objective: Vitals:   10/17/23 0600 10/17/23 0630 10/17/23 0700 10/17/23 0800  BP: (!) 148/72 (!) 146/67 (!) 155/63   Pulse: 78 71 70   Resp: 15 17 17    Temp:    98.4 F (36.9 C)  TempSrc:    Oral  SpO2: 96% 96% 96%   Weight:      Height:        Intake/Output Summary (Last 24 hours) at 10/17/2023 1048 Last data filed at 10/17/2023 4098 Gross per 24 hour  Intake 949.77 ml  Output 6075 ml  Net -5125.23 ml   Filed Weights   10/15/23 0702 10/16/23 0500 10/17/23 0500  Weight: 97.4 kg 87.6 kg 87.4 kg    Examination:  General exam: Appears calm and comfortable  Respiratory system: Clear to auscultation. Respiratory effort normal. Cardiovascular system: S1 & S2 heard, irregular tachycardic Gastrointestinal system: Abdomen with JP drain and ostomy with honeycomb dressing Central nervous system: Alert and awake Extremities: Diffuse edema to upper and lower extremities noted, nonpitting Skin: No significant  lesions noted Psychiatry: Flat affect. Foley with clear, yellow urine output noted    Data Reviewed: I have personally reviewed following labs and imaging studies  CBC: Recent Labs  Lab 10/13/23 2050 10/14/23 0443 10/15/23 0456 10/15/23 0719 10/15/23 1818 10/16/23 0620 10/17/23 0407  WBC 23.1* 18.1* 10.1  --   --  8.4 7.5  HGB 10.0* 8.9* 6.8* 7.2* 10.8* 10.1* 10.9*  HCT 28.7* 26.7* 20.7* 21.5* 30.0* 29.0* 32.4*  MCV 88.9 88.4 90.0  --   --  88.4 89.5  PLT 145* 153 135*  --   --  128* 131*   Basic Metabolic Panel: Recent Labs  Lab 10/13/23 0431 10/14/23 0443 10/15/23 0456 10/16/23 0620 10/17/23 0407  NA 136 136 132* 130* 132*  K 3.5 4.0 3.5 3.8 3.8  CL 106 108 101 96* 96*  CO2 22 22 24 28  32  GLUCOSE 70 144* 122* 108* 123*  BUN 14 16 23  28* 33*  CREATININE 1.45* 1.40* 1.63* 1.56* 1.36*  CALCIUM 7.8* 7.7* 8.2* 7.8* 8.0*  MG 1.6* 1.6* 1.9 1.6* 1.9  PHOS  --  3.1  --   --  2.4*   GFR: Estimated Creatinine Clearance: 36.5 mL/min (A) (by C-G formula based on SCr of 1.36 mg/dL (H)). Liver Function Tests: Recent Labs  Lab 10/14/23 0443 10/15/23 0456 10/17/23 0407  AST 18 14* 14*  ALT 14 12 11   ALKPHOS 29* 23* 33*  BILITOT 0.5 0.3 0.5  PROT 3.8* 4.5* 4.8*  ALBUMIN  1.9* 2.8* 2.3*   No results for input(s): "LIPASE", "AMYLASE" in the last 168 hours.  No results for input(s): "AMMONIA" in the last 168 hours. Coagulation Profile: No results for input(s): "INR", "PROTIME" in the last 168 hours. Cardiac Enzymes: No results for input(s): "CKTOTAL", "CKMB", "CKMBINDEX", "TROPONINI" in the last 168 hours. BNP (last 3 results) No results for input(s): "PROBNP" in the last 8760 hours. HbA1C: No results for input(s): "HGBA1C" in the last 72 hours. CBG: Recent Labs  Lab 10/16/23 0006 10/16/23 0802 10/16/23 1649 10/16/23 2321 10/17/23 0801  GLUCAP 117* 129* 138* 146* 153*   Lipid Profile: Recent Labs    10/17/23 0407  TRIG 122   Thyroid Function  Tests: No results for input(s): "TSH", "T4TOTAL", "FREET4", "T3FREE", "THYROIDAB" in the last 72 hours. Anemia Panel: No results for input(s): "VITAMINB12", "FOLATE", "FERRITIN", "TIBC", "IRON", "RETICCTPCT" in the last 72 hours. Sepsis Labs: No results for input(s): "PROCALCITON", "LATICACIDVEN" in the last 168 hours.  Recent Results (from the past 240 hours)  Aerobic/Anaerobic Culture w Gram Stain (surgical/deep wound)     Status: None (Preliminary result)   Collection Time: 10/13/23  3:58 PM   Specimen: Path fluid; GI  Result Value Ref Range Status   Specimen Description ABSCESS  Final   Special Requests INTRAABDOMINAL  Final   Gram Stain   Final    FEW WBC PRESENT, PREDOMINANTLY PMN NO ORGANISMS SEEN Performed at [facility], [address], [place], [place] [address]    Culture   Final    RARE CANDIDA ALBICANS NO ANAEROBES ISOLATED; CULTURE IN PROGRESS FOR 5 DAYS    Report Status PENDING  Incomplete         Radiology Studies: No results found.      Scheduled Meds:  sodium chloride    Intravenous Once   acetaminophen   1,000 mg Oral Q6H   Chlorhexidine  Gluconate Cloth  6 each Topical Daily   furosemide   20 mg Intravenous Q12H   insulin  aspart  0-15 Units Subcutaneous Q8H   levothyroxine   50 mcg Oral Daily   metoprolol  tartrate  5 mg Intravenous Q6H   pantoprazole  (PROTONIX ) IV  40 mg Intravenous QHS   sodium chloride  flush  10-40 mL Intracatheter Q12H   Continuous Infusions:  amiodarone  30 mg/hr (10/17/23 1040)   amiodarone  150 mg (10/17/23 1041)   piperacillin -tazobactam (ZOSYN )  IV 3.375 g (10/17/23 0612)   TPN ADULT (ION) 75 mL/hr at 10/16/23 1842   TPN ADULT (ION)       LOS: 10 days    Critical care time spent: 55 minutes    Ramond Darnell D Mason Sole, DO [facility]  If 7PM-7AM, please contact night-coverage [email or web address] 10/17/2023, 10:48 AM

## 2023-10-17 NOTE — Consult Note (Addendum)
 WOC Nurse ostomy follow up Pt stated she was not willing to look at her stoma yet but listened to the instructions and watched the pouch change process. Pt is still in ICU with an NG and she stated there are no family members that need to be present for teaching or who will provide assistance. Her friend was at the bedside and watched the teaching session.  Stoma is red and viable, flush with skin level, 1 1/2 inches.  Applied barrier ring and one piece flexible convex pouch.  Demonstrated application process and opening and closing the velcro to empty. Pt states her hands are too swollen to move and she did not attempt opening and closing.  Emptied 50cc liquid brown stool. Mod amt old clotted blood was cleansed from around the stoma, no further bleeding noted.  Reviewed pouching routines and ordering supplies.  Educational materials left at the bedside, along with 3 sets of each supply.  Use supplies: Use Supplies: barrier ring Timm Foot # [id number] and convex pouch Lawson # [id number] Enrolled patient in [facility] Discharge program: Yes, today; requested barrier rings and one piece flexible convex pouches with filters.  Pt plans to d/c to a SNF, according to progress notes, and will require total assistance at this time with pouch application and emptying.  Thank-you,  Wiliam Harder MSN, RN, CWOCN, [place], CNS [phone]

## 2023-10-18 DIAGNOSIS — K5720 Diverticulitis of large intestine with perforation and abscess without bleeding: Secondary | ICD-10-CM | POA: Diagnosis not present

## 2023-10-18 LAB — CBC
HCT: 28.9 % — ABNORMAL LOW (ref 36.0–46.0)
HCT: 32 % — ABNORMAL LOW (ref 36.0–46.0)
Hemoglobin: 9.1 g/dL — ABNORMAL LOW (ref 12.0–15.0)
Hemoglobin: 10.2 g/dL — ABNORMAL LOW (ref 12.0–15.0)
MCH: 30.6 pg (ref 26.0–34.0)
MCH: 29.7 pg (ref 26.0–34.0)
MCHC: 31.5 g/dL (ref 30.0–36.0)
MCHC: 31.9 g/dL (ref 30.0–36.0)
MCV: 97.3 fL (ref 80.0–100.0)
MCV: 93 fL (ref 80.0–100.0)
Platelets: 118 10*3/uL — ABNORMAL LOW (ref 150–400)
Platelets: 127 10*3/uL — ABNORMAL LOW (ref 150–400)
RBC: 2.97 MIL/uL — ABNORMAL LOW (ref 3.87–5.11)
RBC: 3.44 MIL/uL — ABNORMAL LOW (ref 3.87–5.11)
RDW: 16.7 % — ABNORMAL HIGH (ref 11.5–15.5)
RDW: 16.2 % — ABNORMAL HIGH (ref 11.5–15.5)
WBC: 7 10*3/uL (ref 4.0–10.5)
WBC: 7.9 10*3/uL (ref 4.0–10.5)
nRBC: 0 % (ref 0.0–0.2)
nRBC: 0 % (ref 0.0–0.2)

## 2023-10-18 LAB — AEROBIC/ANAEROBIC CULTURE W GRAM STAIN (SURGICAL/DEEP WOUND)

## 2023-10-18 LAB — GLUCOSE, CAPILLARY
Glucose-Capillary: 120 mg/dL — ABNORMAL HIGH (ref 70–99)
Glucose-Capillary: 131 mg/dL — ABNORMAL HIGH (ref 70–99)
Glucose-Capillary: 157 mg/dL — ABNORMAL HIGH (ref 70–99)
Glucose-Capillary: 171 mg/dL — ABNORMAL HIGH (ref 70–99)

## 2023-10-18 LAB — BASIC METABOLIC PANEL WITH GFR
Anion gap: 8 (ref 5–15)
BUN: 40 mg/dL — ABNORMAL HIGH (ref 8–23)
CO2: 28 mmol/L (ref 22–32)
Calcium: 8.2 mg/dL — ABNORMAL LOW (ref 8.9–10.3)
Chloride: 93 mmol/L — ABNORMAL LOW (ref 98–111)
Creatinine, Ser: 1.21 mg/dL — ABNORMAL HIGH (ref 0.44–1.00)
GFR, Estimated: 46 mL/min — ABNORMAL LOW (ref >60)
Glucose, Bld: 119 mg/dL — ABNORMAL HIGH (ref 70–99)
Potassium: 4.3 mmol/L (ref 3.5–5.1)
Sodium: 129 mmol/L — ABNORMAL LOW (ref 135–145)

## 2023-10-18 LAB — MAGNESIUM: Magnesium: 1.7 mg/dL (ref 1.7–2.4)

## 2023-10-18 LAB — HEPARIN LEVEL (UNFRACTIONATED)
Heparin Unfractionated: 0.14 [IU]/mL — ABNORMAL LOW (ref 0.30–0.70)
Heparin Unfractionated: 0.49 [IU]/mL (ref 0.30–0.70)

## 2023-10-18 LAB — BRAIN NATRIURETIC PEPTIDE: B Natriuretic Peptide: 85 pg/mL (ref 0.0–100.0)

## 2023-10-18 MED ORDER — BOOST / RESOURCE BREEZE PO LIQD CUSTOM
1.0000 | Freq: Two times a day (BID) | ORAL | Status: DC
  Administered 2023-10-18: 1 via ORAL

## 2023-10-18 MED ORDER — TRACE MINERALS CU-MN-SE-ZN 300-55-60-3000 MCG/ML IV SOLN
INTRAVENOUS | Status: AC
  Filled 2023-10-18: qty 900

## 2023-10-18 MED ORDER — SODIUM CHLORIDE 0.9 % IV SOLN
12.5000 mg | Freq: Once | INTRAVENOUS | Status: AC
  Administered 2023-10-18: 12.5 mg via INTRAVENOUS
  Filled 2023-10-18: qty 0.5

## 2023-10-18 NOTE — Progress Notes (Signed)
 PHARMACY - ANTICOAGULATION  Pharmacy Consult for heparin  Indication: atrial fibrillation Brief A/P: Heparin  level subtherapeutic Increase Heparin  rate  Allergies  Allergen Reactions   Amlodipine Swelling   Clonidine Rash    Rash with patch only.  Okay to take pill    Patient Measurements: Height: 5\' 4"  (162.6 cm) Weight: 87.4 kg (192 lb 10.9 oz) IBW/kg (Calculated) : 54.7 HEPARIN  DW (KG): 73.5  Vital Signs: Temp: 98.2 F (36.8 C) (05/19 2358) Temp Source: Oral (05/19 2358) BP: 132/52 (05/19 2100) Pulse Rate: 66 (05/19 2100)  Labs: Recent Labs    10/15/23 0456 10/15/23 0719 10/15/23 1818 10/16/23 0620 10/17/23 0407 10/17/23 2158  HGB 6.8*   < > 10.8* 10.1* 10.9*  --   HCT 20.7*   < > 30.0* 29.0* 32.4*  --   PLT 135*  --   --  128* 131*  --   HEPARINUNFRC  --   --   --   --   --  0.12*  CREATININE 1.63*  --   --  1.56* 1.36*  --    < > = values in this interval not displayed.    Estimated Creatinine Clearance: 36.5 mL/min (A) (by C-G formula based on SCr of 1.36 mg/dL (H)).  Assessment: 78 y.o. female with h/o Afib, Eliquis  on hold, for heparin .  Goal of Therapy:  Heparin  level 0.3-0.7 units/ml Monitor platelets by anticoagulation protocol: Yes   Plan:  Increase Heparin  750 units/hr Check heparin  level in 8 hours.   Claudine Cullens, PharmD, BCPS  10/18/2023 12:08 AM

## 2023-10-18 NOTE — Progress Notes (Signed)
 Physical Therapy Treatment Patient Details Name: Felicia Frank MRN: [id number] DOB: Jun 30, 1945 Today's Date: 10/18/2023   History of Present Illness Felicia Frank is a 78 y.o. 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     Workup in the ED demonstrating elevated WBCs and abnormal CT scan with concern for enteral fistula formation, contained abscess and diverticulitis.  Unfortunately given location of this new contained abscess no ability or safe margin for placement percutaneous drain.     Case was discussed with general surgery who recommended admission for IV antibiotics and anticipated surgical intervention on 10/10/2023.    PT Comments  Patient presents up in chair (assisted by nursing staff) and agreeable for therapy.  Patient demonstrates fair/good return for completing BLE ROM/strengthening exercises while seated at bedside, had difficulty completing seated hip raised due to stomach discomfort and tolerated walking in room before requesting to sit and going back to bed due to c/o fatigue.  Patient had difficulty lifting legs onto bed requiring Mod assist when put back to bed. Patient will benefit from continued skilled physical therapy in hospital and recommended venue below to increase strength, balance, endurance for safe ADLs and gait.    If plan is discharge home, recommend the following: A lot of help with bathing/dressing/bathroom;A lot of help with walking and/or  transfers;Help with stairs or ramp for entrance;Assistance with cooking/housework   Can travel by private vehicle     No  Equipment Recommendations  None recommended by PT    Recommendations for Other Services       Precautions / Restrictions Precautions Precautions: Fall Recall of Precautions/Restrictions: Intact Restrictions Weight Bearing Restrictions Per Provider Order: No     Mobility  Bed Mobility Overal bed mobility: Needs Assistance Bed Mobility: Rolling, Sit to Sidelying Rolling: Min assist       Sit to sidelying: Mod assist General bed mobility comments: had difficulty moving legs during sit to sidelying due to weakness    Transfers Overall transfer level: Needs assistance Equipment used: Rolling walker (2 wheels) Transfers: Sit to/from Stand, Bed to chair/wheelchair/BSC Sit to Stand: Min assist   Step pivot transfers: Min assist, Mod assist       General transfer comment: increased time, labored movment    Ambulation/Gait Ambulation/Gait assistance: Min assist, Mod assist Gait Distance (Feet): 18 Feet Assistive device: Rolling walker (2 wheels) Gait Pattern/deviations: Decreased step length - right, Decreased step length - left, Decreased stride length Gait velocity: slow     General Gait Details: increased endurance/distance demonstrating slow labored movement before requesting to sit due to fatigue and generalilzed weakness   Stairs             Wheelchair Mobility     Tilt Bed    Modified Rankin (Stroke Patients Only)       Balance Overall balance assessment: Needs assistance Sitting-balance support: Feet supported, No upper extremity supported Sitting balance-Leahy Scale: Fair Sitting balance - Comments: fair/good seated at EOB   Standing balance support: During functional activity, Bilateral upper extremity supported, Reliant on assistive device for balance Standing balance-Leahy Scale: Poor Standing balance comment:  fair/poor using RW  Communication Communication Communication: No apparent difficulties  Cognition Arousal: Alert Behavior During Therapy: WFL for tasks assessed/performed   PT - Cognitive impairments: No apparent impairments                         Following commands: Intact      Cueing Cueing Techniques: Verbal cues  Exercises General Exercises - Lower Extremity Long Arc Quad: Seated, AROM, Strengthening, Both, 10 reps Hip Flexion/Marching: Seated, AROM, Strengthening, Both, 10 reps Toe Raises: Seated, AROM, Strengthening, Both, 10 reps Heel Raises: Seated, AROM, Strengthening, Both, 10 reps    General Comments        Pertinent Vitals/Pain Pain Assessment Pain Assessment: Faces Faces Pain Scale: Hurts a little bit Pain Location: stomach at site of surgery Pain Descriptors / Indicators: Discomfort Pain Intervention(s): Limited activity within patient's tolerance, Monitored during session, Repositioned    Home Living                          Prior Function            PT Goals (current goals can now be found in the care plan section) Acute Rehab PT Goals Patient Stated Goal: return home after rehab PT Goal Formulation: With patient/family Time For Goal Achievement: 10/28/23 Potential to Achieve Goals: Good Progress towards PT goals: Progressing toward goals    Frequency    Min 3X/week      PT Plan      Co-evaluation              AM-PAC PT "6 Clicks" Mobility   Outcome Measure  Help needed turning from your back to your side while in a flat bed without using bedrails?: A Little Help needed moving from lying on your back to sitting on the side of a flat bed without using bedrails?: A Lot Help needed moving to and from a bed to a chair (including a wheelchair)?: A Lot Help needed standing up from a chair using your arms (e.g., wheelchair or bedside chair)?: A Little Help needed to walk in  hospital room?: A Lot Help needed climbing 3-5 steps with a railing? : A Lot 6 Click Score: 14    End of Session   Activity Tolerance: Patient tolerated treatment well;Patient limited by fatigue Patient left: in bed;with family/visitor present Nurse Communication: Mobility status PT Visit Diagnosis: Unsteadiness on feet (R26.81);Other abnormalities of gait and mobility (R26.89);Muscle weakness (generalized) (M62.81)     Time: 1610-9604 PT Time Calculation (min) (ACUTE ONLY): 28 min  Charges:    $Therapeutic Exercise: 8-22 mins $Therapeutic Activity: 8-22 mins PT General Charges $$ ACUTE PT VISIT: 1 Visit                     2:02 PM, 10/18/23 Walton Guppy, MPT Physical Therapist with [facility] 336 [phone] office [phone] mobile phone

## 2023-10-18 NOTE — Progress Notes (Signed)
 PHARMACY - TOTAL PARENTERAL NUTRITION CONSULT NOTE   Indication:  S/p partial colectomy, ileocecectomy, partial resection.  Patient Measurements: Height: 5\' 4"  (162.6 cm) Weight: 85.1 kg (187 lb 9.8 oz) IBW/kg (Calculated) : 54.7 TPN AdjBW (KG): 62.4 Body mass index is 32.2 kg/m. Usual Weight: Unknown  Assessment: 78 year old female hx Afib, CKD, obesity admitted with perforated sigmoid diverticulitis now POD 2 partial colectomy with end colostomy, ileocecectomy, partial small bowel resection. At risk for refeeding d/t minimal intake since admission.  Glucose / Insulin : No prior Hx DM, CBGs 120-141. 6 units given in past 24 hours  Electrolytes: Na 132, K 3.8, Mag 1.6. Corrected Ca 9.4 Phos 2.4  Renal: Hx CKD, Scr 1.36 Hepatic: WNL  GI Surgeries / Procedures:  5/15 partial colectomy/small bowel resection  Central access: Double lumen PICC since 5/11 TPN start date: 5/16  Nutritional Goals: Goal TPN rate is 75 ml/hr over 24 hours (provides 90 g of protein and 1800 kcals per day)   RD Assessment: Estimated Needs Total Energy Estimated Needs: 1700-1900 Total Protein Estimated Needs: 90-110 gm Total Fluid Estimated Needs: 1.7-1.9 L  Current Nutrition:  NPO. 100% nutritional needs from TPN  Plan:  Continue TPN to goal 75 mL/hr at 1800 Electrolytes in TPN: Na 75 mEq/L, K 7mEq/L, Ca 64mEq/L, Mg 8 mEq/L, and Phos 62mmol/L.  Cl:Ac 2:1 Add standard MVI and trace elements to TPN Add Thiamine 100mg  to TPN Continue Moderate q8h SSI and adjust as needed  Monitor TPN labs on Mon/Thurs  Cliffton Dama, PharmD Clinical Pharmacist 10/18/2023 8:10 AM

## 2023-10-18 NOTE — Progress Notes (Signed)
 [facility] Progress Note  5 Days Post-Op  Subjective: Patient seen and examined.  She is resting comfortably in bed.  She was able to tolerate some water and ice yesterday without nausea and vomiting.  She continues to have ostomy liquid stool output and gas.  Her pain is doing okay at this time.  She denies nausea and vomiting  Objective: Vital signs in last 24 hours: Temp:  [97.9 F (36.6 C)-98.2 F (36.8 C)] 98 F (36.7 C) (05/20 0803) Pulse Rate:  [64-127] 66 (05/20 0700) Resp:  [11-22] 17 (05/20 0700) BP: (117-159)/(41-76) 157/43 (05/20 0700) SpO2:  [94 %-100 %] 100 % (05/20 0700) Weight:  [85.1 kg] 85.1 kg (05/20 0500) Last BM Date : 10/16/23  Intake/Output from previous day: 05/19 0701 - 05/20 0700 In: 1659.2 [I.V.:1499.5; IV Piggyback:159.7] Out: 3175 [Urine:2650; Drains:25; Stool:500] Intake/Output this shift: Total I/O In: -  Out: 300 [Urine:300]  General appearance: alert, cooperative, and no distress Nose: ng tube in place, clamped GI: Abdomen soft, no percussion tenderness, mild incisional tenderness to palpation; no rigidity, guarding, rebound tenderness; midline incision C/D/I with honeycomb dressing in place, right sided ostomy hyperemic and patent with liquid stool and gas in bag, JP drain in right side of abdomen with serosanguineous output,25 cc in last 24 hours  Lab Results:  Recent Labs    10/18/23 0848 10/18/23 0951  WBC 7.0 7.9  HGB 9.1* 10.2*  HCT 28.9* 32.0*  PLT 118* 127*   BMET Recent Labs    10/17/23 0407 10/18/23 0951  NA 132* 129*  K 3.8 4.3  CL 96* 93*  CO2 32 28  GLUCOSE 123* 119*  BUN 33* 40*  CREATININE 1.36* 1.21*  CALCIUM 8.0* 8.2*   PT/INR No results for input(s): "LABPROT", "INR" in the last 72 hours.  Studies/Results: No results found.  Anti-infectives: Anti-infectives (From admission, onward)    Start     Dose/Rate Route Frequency Ordered Stop   10/13/23 1200  cefoTEtan  (CEFOTAN ) 2 g in  sodium chloride  0.9 % 100 mL IVPB        2 g 200 mL/hr over 30 Minutes Intravenous On call to O.R. 10/12/23 0836 10/13/23 1401   10/13/23 1128  sodium chloride  0.9 % with cefoTEtan  (CEFOTAN ) ADS Med       Note to Pharmacy: Gabino Joe S: cabinet override      10/13/23 1128 10/13/23 1357   10/12/23 1300  metroNIDAZOLE  (FLAGYL ) tablet 500 mg        500 mg Oral 3 times daily 10/11/23 1212 10/12/23 2116   10/12/23 1300  neomycin  (MYCIFRADIN ) tablet 1,000 mg        1,000 mg Oral 3 times daily 10/11/23 1212 10/12/23 2136   10/11/23 1300  neomycin  (MYCIFRADIN ) tablet 1,000 mg  Status:  Discontinued        1,000 mg Oral 3 times daily 10/11/23 0839 10/11/23 1212   10/11/23 1300  metroNIDAZOLE  (FLAGYL ) tablet 500 mg  Status:  Discontinued        500 mg Oral 3 times daily 10/11/23 0839 10/11/23 1212   10/07/23 1500  piperacillin -tazobactam (ZOSYN ) IVPB 3.375 g        3.375 g 12.5 mL/hr over 240 Minutes Intravenous Every 8 hours 10/07/23 1356         Assessment/Plan:  Patient is a 78 year old female who was 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  She is status post Hartman's procedure with small bowel resection and ileocecectomy on 5/16.   -  Continue IV Zosyn  for 5 days postoperatively - Gram stain from surgery with no organisms noted.  Culture with rare Candida albicans, await final results - Continue clear liquids today - Remove NG tube - Continue TPN until patient tolerating solid food - Monitor bowel function - Appreciate ostomy nurse recommendations - JP drain care - Okay for Foley removal from a surgical standpoint whenever the hospitalist deems the Foley is no longer necessary - Hemoglobin stable this morning after restarting heparin  drip - Ok to continue heparin  gtt  - Appreciate hospitalist and consultants recommendations   LOS: 11 days    Deiontae Rabel A Annalucia Laino 10/18/2023  Note: Portions of this report may have been  transcribed using voice recognition software. Every effort has been made to ensure accuracy; however, inadvertent computerized transcription errors may still be present.

## 2023-10-18 NOTE — Progress Notes (Signed)
 Rounding Note    Patient Name: Crissa Sowder Date of Encounter: 10/18/2023  [facility] Cardiologist  Subjective   Pt denies CP  Breathing is OK   Inpatient Medications    Scheduled Meds:  sodium chloride    Intravenous Once   acetaminophen   1,000 mg Oral Q6H   Chlorhexidine  Gluconate Cloth  6 each Topical Daily   insulin  aspart  0-15 Units Subcutaneous Q8H   levothyroxine   50 mcg Oral Daily   metoprolol  tartrate  5 mg Intravenous Q6H   pantoprazole  (PROTONIX ) IV  40 mg Intravenous QHS   sodium chloride  flush  10-40 mL Intracatheter Q12H   Continuous Infusions:  amiodarone  30 mg/hr (10/18/23 0636)   heparin  750 Units/hr (10/18/23 0636)   piperacillin -tazobactam (ZOSYN )  IV 12.5 mL/hr at 10/18/23 0636   TPN ADULT (ION) 75 mL/hr at 10/18/23 0636   TPN ADULT (ION)     PRN Meds: HYDROmorphone  (DILAUDID ) injection, ondansetron  **OR** ondansetron  (ZOFRAN ) IV, oxyCODONE , prochlorperazine , sodium chloride  flush   Vital Signs    Vitals:   10/18/23 0530 10/18/23 0600 10/18/23 0700 10/18/23 0803  BP: (!) 136/41 (!) 144/48 (!) 157/43   Pulse: 77 71 66   Resp: 13 17 17    Temp:    98 F (36.7 C)  TempSrc:    Oral  SpO2: 96% 98% 100%   Weight:      Height:        Intake/Output Summary (Last 24 hours) at 10/18/2023 0944 Last data filed at 10/18/2023 1610 Gross per 24 hour  Intake 1659.16 ml  Output 3475 ml  Net -1815.84 ml      10/18/2023    5:00 AM 10/17/2023    5:00 AM 10/16/2023    5:00 AM  Last 3 Weights  Weight (lbs) 187 lb 9.8 oz 192 lb 10.9 oz 193 lb 2 oz  Weight (kg) 85.1 kg 87.4 kg 87.6 kg      Telemetry    SR  - Personally Reviewed  ECG    No new  - Personally Reviewed  Physical Exam   GEN: No acute distress.   Neck: No JVD Cardiac: RRR, no murmurs Respiratory: Clear to auscultation anteriorly  RU:[id number]  Ext  1+ LE edema    Labs    High Sensitivity Troponin:  No results for input(s): "TROPONINIHS" in the last 720 hours.    Chemistry Recent Labs  Lab 10/14/23 0443 10/15/23 0456 10/16/23 0620 10/17/23 0407  NA 136 132* 130* 132*  K 4.0 3.5 3.8 3.8  CL 108 101 96* 96*  CO2 22 24 28  32  GLUCOSE 144* 122* 108* 123*  BUN 16 23 28* 33*  CREATININE 1.40* 1.63* 1.56* 1.36*  CALCIUM 7.7* 8.2* 7.8* 8.0*  MG 1.6* 1.9 1.6* 1.9  PROT 3.8* 4.5*  --  4.8*  ALBUMIN  1.9* 2.8*  --  2.3*  AST 18 14*  --  14*  ALT 14 12  --  11  ALKPHOS 29* 23*  --  33*  BILITOT 0.5 0.3  --  0.5  GFRNONAA 39* 32* 34* 40*  ANIONGAP 6 7 6  4*    Lipids  Recent Labs  Lab 10/17/23 0407  TRIG 122    Hematology Recent Labs  Lab 10/16/23 0620 10/17/23 0407 10/18/23 0848  WBC 8.4 7.5 7.0  RBC 3.28* 3.62* 2.97*  HGB 10.1* 10.9* 9.1*  HCT 29.0* 32.4* 28.9*  MCV 88.4 89.5 97.3  MCH 30.8 30.1 30.6  MCHC 34.8 33.6 31.5  RDW  16.6* 16.5* 16.7*  PLT 128* 131* 118*   Thyroid No results for input(s): "TSH", "FREET4" in the last 168 hours.  BNP Recent Labs  Lab 10/18/23 0848  BNP 85.0    DDimer No results for input(s): "DDIMER" in the last 168 hours.   Radiology    No results found.  Cardiac Studies   Echo  May 2025 1. Left ventricular ejection fraction, by estimation, is 65 to 70%. The  left ventricle has normal function. The left ventricle has no regional  wall motion abnormalities. Left ventricular diastolic parameters are  indeterminate.   2. Right ventricular systolic function is normal. The right ventricular  size is normal. Tricuspid regurgitation signal is inadequate for assessing  PA pressure.   3. The mitral valve is normal in structure. No evidence of mitral valve  regurgitation. No evidence of mitral stenosis.   4. The aortic valve is tricuspid. Aortic valve regurgitation is not  visualized. No aortic stenosis is present.      Patient Profile:    Laresa Oshiro is a 78 y.o. female with a hx of hypothyroidism, primary hypertension, chronic kidney disease stage IIIa, class I obesity, paroxysmal  atrial fibrillation with recent hospitalization secondary to diverticulitis with perforation and contained abscess status post drain placement and internal fistula formation,  who is being seen 10/17/2023 for the evaluation of atrial fibrillation with RVR at the request of Dr Mason Sole.    Assessment & Plan     1  Atrial fibrillation with RVR  Intermitten   Has had in past  Remains in SR now on IV amiodarone    Continue    Just starting PO   Folow   may be able to transition to PO soon  2  GI  pt s/p partial resection for diverticular perforation   3  HTN   Pt's BP a little high today   130 to 168/  Follow    4  Renal  BUN/Cr 40/1.21   Follow I/O   Volume is up some  If renal function stable would rx with lasix    5  Heme  Hgb 10.2    For questions or updates, please contact [facility] Please consult [email or web address] for contact info under        Signed, Ola Berger, MD  10/18/2023, 9:44 AM

## 2023-10-18 NOTE — Plan of Care (Signed)

## 2023-10-18 NOTE — Progress Notes (Signed)
   10/18/23 2000  Vitals  BP (!) (S)  53/25  MAP (mmHg) (!) (S)  32  Pulse Rate (!) (S)  108  ECG Heart Rate (!) (S)  113  Resp (!) (S)  30  Oxygen Therapy  SpO2 (S)  100 %   Patient was taken up to [facility] to try voiding, vital signs were better, she used the walker fine and walked to the [facility] fine, after she was done may be around 10 min later patient told she was not able to void and  requested to be transferred to the bed, this RN offers her walker to grab on the both handle, patient couldn't grab  properly from her left hand, she stated she feels dizzy and light headed, CBG 157, vital signs from the given chart, transferred her to bed, put her in trendelenburg position for a while,  MD adefeso made aware, not a candidate for fluid bolus, later after a while BP came back okay, was resting and after a while went back to Afib with RVR, with HR 140s-150s, after about 15 min converted on her own to NSR without any interventions, MD and charge RN  informed about all events.

## 2023-10-18 NOTE — Progress Notes (Signed)
 PHARMACY - ANTICOAGULATION CONSULT NOTE  Pharmacy Consult for heparin  Indication: atrial fibrillation  Allergies  Allergen Reactions   Amlodipine Swelling   Clonidine Rash    Rash with patch only.  Okay to take pill    Patient Measurements: Height: 5\' 4"  (162.6 cm) Weight: 85.1 kg (187 lb 9.8 oz) IBW/kg (Calculated) : 54.7 HEPARIN  DW (KG): 73.5  Vital Signs: Temp: 97.9 F (36.6 C) (05/20 1632) Temp Source: Oral (05/20 1632) BP: 140/64 (05/20 1200) Pulse Rate: 80 (05/20 1200)  Labs: Recent Labs    10/16/23 0620 10/17/23 0407 10/17/23 2158 10/18/23 0848 10/18/23 0951 10/18/23 1625  HGB 10.1* 10.9*  --  9.1* 10.2*  --   HCT 29.0* 32.4*  --  28.9* 32.0*  --   PLT 128* 131*  --  118* 127*  --   HEPARINUNFRC  --   --  0.12* 0.14*  --  0.49  CREATININE 1.56* 1.36*  --   --  1.21*  --     Estimated Creatinine Clearance: 40.5 mL/min (A) (by C-G formula based on SCr of 1.21 mg/dL (H)).   Medical History: Past Medical History:  Diagnosis Date   Diverticulosis    HTN (hypertension)    Hypothyroidism     Medications:  Medications Prior to Admission  Medication Sig Dispense Refill Last Dose/Taking   amiodarone  (PACERONE ) 200 MG tablet Take 1 tablet (200 mg total) by mouth daily. 30 tablet 1 10/06/2023   apixaban  (ELIQUIS ) 5 MG TABS tablet Take 1 tablet (5 mg total) by mouth 2 (two) times daily. 60 tablet 2 10/06/2023 at  8:30 PM   doxazosin  (CARDURA ) 8 MG tablet Take 8 mg by mouth at bedtime.   10/06/2023 Bedtime   levothyroxine  (SYNTHROID ) 50 MCG tablet Take 50 mcg by mouth daily.   10/06/2023 Morning   metoprolol  tartrate (LOPRESSOR ) 50 MG tablet Take 1 tablet (50 mg total) by mouth 2 (two) times daily. 60 tablet 1 10/06/2023 Evening   ondansetron  (ZOFRAN ) 4 MG tablet Take 1 tablet (4 mg total) by mouth daily as needed for nausea or vomiting. 30 tablet 1 Taking As Needed   senna-docusate (SENOKOT-S) 8.6-50 MG tablet Take 2 tablets by mouth 2 (two) times daily. 120 tablet 0  10/06/2023 Evening   traMADol  (ULTRAM ) 50 MG tablet Take 1 tablet (50 mg total) by mouth every 12 (twelve) hours as needed. 15 tablet 0 Taking As Needed   zolpidem  (AMBIEN ) 10 MG tablet Take 10 mg by mouth at bedtime as needed for sleep.   10/06/2023 Bedtime   estradiol (ESTRACE) 0.1 MG/GM vaginal cream Place 0.5 g vaginally as needed (vaginal dryness/irritation). Twice a week PRN      furosemide  (LASIX ) 20 MG tablet Take 1 tablet every day by oral route for 30 days. (Patient not taking: Reported on 10/07/2023)   Not Taking    Assessment: Pharmacy consulted to dose heparin  in patient with atrial fibrillation.  Patient is on Eliquis  prior to admission with last dose 5/8- anticoagulation has been held following procedure per surgery.  HL 0.49- therapeutic Hgb 9.1  Goal of Therapy:  Heparin  level 0.3-0.7 units/ml Monitor platelets by anticoagulation protocol: Yes   Plan:  Continue heparin  infusion to 950 units/hr Check anti-Xa level in 6-8 hours and daily Continue to monitor H&H and platelets    Cliffton Dama, PharmD Clinical Pharmacist 10/18/2023 5:06 PM

## 2023-10-18 NOTE — Progress Notes (Signed)
 Initial Nutrition Assessment  DOCUMENTATION CODES:   Not applicable  INTERVENTION:   TPN to meet 100% of estimated nutrition needs until patient is tolerating solid foods with good intake.  Add Boost Breeze po BID, each supplement provides 250 kcal and 9 grams of protein.  NUTRITION DIAGNOSIS:   Increased nutrient needs related to post-op healing as evidenced by estimated needs. Ongoing.  GOAL:   Patient will meet greater than or equal to 90% of their needs Met with TPN  MONITOR:   I & O's, Diet advancement  REASON FOR ASSESSMENT:   Consult New TPN/TNA  ASSESSMENT:   78 yo female admitted with diverticulitis of colon with perforation. PMH includes HTN, hypothyroidism, diverticulosis.  5/9 - 5/14 patient was on full liquids, minimal intake. 5/14: changed to clear liquids. 5/15: NPO for surgery. 5/15: S/P partial colectomy with colostomy creation, partial small bowel resection, ileocecectomy.   5/16: TPN initiated. 5/20: starting clear liquids. NG tube to be removed.   Patient reports good intake at home PTA. She had lost some weight d/t change in eating habits. We discussed trying Boost Breeze supplement to help increase oral intake of protein and calories.   TPN at 75 ml/h provides 1800 kcal and 90 gm protein daily.   Labs reviewed. Na 129, phos 2.4 (5/19), phos result pending today CBG: 140-141-120  Medications reviewed and include novolog , protonix . Received mag sulfate 2g IV this morning for repletion.   NG tube in place with 0 ml output documented x 24 hours. NG being removed today.  JP drain in abdomen with 25 ml output x 24 hours.  UOP 2,650 ml x 24 hours  Admit weight 85.3 kg (5/9) Current weight 85.1 kg (5/20)  Patient with ongoing edema, masking actual weight loss.  NUTRITION - FOCUSED PHYSICAL EXAM:  Flowsheet Row Most Recent Value  Orbital Region No depletion  Upper Arm Region No depletion  Thoracic and Lumbar Region No depletion  Buccal  Region No depletion  Temple Region Mild depletion  Clavicle Bone Region No depletion  Clavicle and Acromion Bone Region No depletion  Scapular Bone Region No depletion  Dorsal Hand No depletion  Patellar Region No depletion  Anterior Thigh Region No depletion  Posterior Calf Region No depletion  Edema (RD Assessment) Moderate  Hair Reviewed  Eyes Reviewed  Mouth Reviewed  Skin Reviewed  Nails Reviewed       Diet Order:   Diet Order             Diet clear liquid Room service appropriate? Yes; Fluid consistency: Thin  Diet effective now                   EDUCATION NEEDS:   No education needs have been identified at this time  Skin:  Skin Assessment: Skin Integrity Issues: Skin Integrity Issues:: Other (Comment), Incisions Incisions: abdomen Other: pin hole sized red open area to R buttock  Last BM:  5/19 type 6/7  Height:   Ht Readings from Last 1 Encounters:  10/13/23 5\' 4"  (1.626 m)    Weight:   Wt Readings from Last 1 Encounters:  10/18/23 85.1 kg    Ideal Body Weight:  54.5 kg  BMI:  Body mass index is 32.2 kg/m.  Estimated Nutritional Needs:   Kcal:  1700-1900  Protein:  90-110 gm  Fluid:  1.7-1.9 L   Barnet Boots RD, LDN, CNSC Contact via secure chat. If unavailable, use group chat "RD Inpatient."

## 2023-10-18 NOTE — Progress Notes (Signed)
 PHARMACY - ANTICOAGULATION CONSULT NOTE  Pharmacy Consult for heparin  Indication: atrial fibrillation  Allergies  Allergen Reactions   Amlodipine Swelling   Clonidine Rash    Rash with patch only.  Okay to take pill    Patient Measurements: Height: 5\' 4"  (162.6 cm) Weight: 85.1 kg (187 lb 9.8 oz) IBW/kg (Calculated) : 54.7 HEPARIN  DW (KG): 73.5  Vital Signs: Temp: 98 F (36.7 C) (05/20 0803) Temp Source: Oral (05/20 0803) BP: 157/43 (05/20 0700) Pulse Rate: 66 (05/20 0700)  Labs: Recent Labs    10/16/23 0620 10/17/23 0407 10/17/23 2158 10/18/23 0848  HGB 10.1* 10.9*  --  9.1*  HCT 29.0* 32.4*  --  28.9*  PLT 128* 131*  --  118*  HEPARINUNFRC  --   --  0.12* 0.14*  CREATININE 1.56* 1.36*  --   --     Estimated Creatinine Clearance: 36 mL/min (A) (by C-G formula based on SCr of 1.36 mg/dL (H)).   Medical History: Past Medical History:  Diagnosis Date   Diverticulosis    HTN (hypertension)    Hypothyroidism     Medications:  Medications Prior to Admission  Medication Sig Dispense Refill Last Dose/Taking   amiodarone  (PACERONE ) 200 MG tablet Take 1 tablet (200 mg total) by mouth daily. 30 tablet 1 10/06/2023   apixaban  (ELIQUIS ) 5 MG TABS tablet Take 1 tablet (5 mg total) by mouth 2 (two) times daily. 60 tablet 2 10/06/2023 at  8:30 PM   doxazosin  (CARDURA ) 8 MG tablet Take 8 mg by mouth at bedtime.   10/06/2023 Bedtime   levothyroxine  (SYNTHROID ) 50 MCG tablet Take 50 mcg by mouth daily.   10/06/2023 Morning   metoprolol  tartrate (LOPRESSOR ) 50 MG tablet Take 1 tablet (50 mg total) by mouth 2 (two) times daily. 60 tablet 1 10/06/2023 Evening   ondansetron  (ZOFRAN ) 4 MG tablet Take 1 tablet (4 mg total) by mouth daily as needed for nausea or vomiting. 30 tablet 1 Taking As Needed   senna-docusate (SENOKOT-S) 8.6-50 MG tablet Take 2 tablets by mouth 2 (two) times daily. 120 tablet 0 10/06/2023 Evening   traMADol  (ULTRAM ) 50 MG tablet Take 1 tablet (50 mg total) by mouth  every 12 (twelve) hours as needed. 15 tablet 0 Taking As Needed   zolpidem  (AMBIEN ) 10 MG tablet Take 10 mg by mouth at bedtime as needed for sleep.   10/06/2023 Bedtime   estradiol (ESTRACE) 0.1 MG/GM vaginal cream Place 0.5 g vaginally as needed (vaginal dryness/irritation). Twice a week PRN      furosemide  (LASIX ) 20 MG tablet Take 1 tablet every day by oral route for 30 days. (Patient not taking: Reported on 10/07/2023)   Not Taking    Assessment: Pharmacy consulted to dose heparin  in patient with atrial fibrillation.  Patient is on Eliquis  prior to admission with last dose 5/8- anticoagulation has been held following procedure per surgery.  HL 0.14- subtherapeutic Hgb 9.1  Goal of Therapy:  Heparin  level 0.3-0.7 units/ml Monitor platelets by anticoagulation protocol: Yes   Plan:  Increase heparin  infusion to 950 units/hr Check anti-Xa level in 6-8 hours and daily Continue to monitor H&H and platelets    Cliffton Dama, PharmD Clinical Pharmacist 10/18/2023 9:16 AM

## 2023-10-18 NOTE — Progress Notes (Signed)
 PROGRESS NOTE    Felicia Frank  ION:[id number] DOB: [birth year] DOA: 10/07/2023 PCP: Allana Ishikawa, MD   Brief Narrative:    Felicia Frank is a 78 y.o. female with medical history significant of hypothyroidism, hypertension, chronic kidney disease stage IIIa, class I obesity, atrial fibrillation and recent hospitalization secondary to diverticulitis with perforation and contained abscess status post drain placement and enteral fistula formation; who presented to the hospital secondary to still ongoing abdominal pain, associated nausea/intermittent vomiting and difficulty keeping things down.  Patient has been admitted with diverticulitis of the colon with perforation and enterocolonic fistula.  She is status post Hartman's procedure with small bowel resection and ileocecectomy on 5/15.  Patient remains on TPN and Lasix  and continues to have some elevated heart rates requiring recurrent amiodarone  bolus and ongoing drip as well as IV metoprolol .  Cardiology consulted for assistance in management.  Diet is not being advanced to clear liquid in 5/20 and NG tube to be removed.  Assessment & Plan:   Principal Problem:   Diverticulitis of colon with perforation Active Problems:   Fistula of large intestine  Assessment and Plan:   diverticulitis of the colon with perforation and enterocolonic fistula status post Hartman's procedure with small bowel resection and ileocecectomy 5/15. -Continue current IV antibiotics with Zosyn  for 5 days postoperatively discontinue back 5/21 - Appreciate general surgery recommendations to advance to clear liquid diet and remove NG tube and continue TPN until tolerating solid food. - Started on Lasix  5/16 and -5.1 L fluid balance noted in the last 24 hours with stable creatinine levels with -7.8 L fluid balance since initiation of Lasix , hold further doses for now -Discontinue Foley catheter   chronic paroxysmal atrial fibrillation with RVR-poorly  controlled - Will continue the use of adjusted dose IV metoprolol  and SR amiodarone  drip with loading dose repeated on 5/19 -Consult to cardiology for further assistance in management - Started back on heparin  drip with stable hemoglobin levels - Continue to monitor CBC  Worsening anemia-improved and now stable after transfusion - Status post 2 unit PRBC transfusion 5/17 and currently stable -Continue to monitor CBC while on heparin  drip   essential hypertension - Continue to monitor    hypothyroidism - Continue Synthroid . - Taking p.o.   acute kidney injury in the setting of chronic kidney disease stage IIIa - Creatinine improving with diuresis - Continue to monitor closely   GERD - Continue PPI   compression fracture of L1 - Continue as needed analgesia - Will recommend continued use of TLSO brace at discharge.   class I obesity -Body mass index is 32.28 kg/m. - Low-calorie diet and portion control discussed with patient.    DVT prophylaxis: Heparin  drip resumed 5/19 Code Status: Full Family Communication: Brother at bedside 5/16 Disposition Plan:  Status is: Inpatient Remains inpatient appropriate because: Need for IV medications and surgical procedure.  Consultants:  General Surgery Cardiology  Procedures:  Hartman's procedure with small bowel resection and ileocecectomy 5/16  Antimicrobials:  Anti-infectives (From admission, onward)    Start     Dose/Rate Route Frequency Ordered Stop   10/13/23 1200  cefoTEtan  (CEFOTAN ) 2 g in sodium chloride  0.9 % 100 mL IVPB        2 g 200 mL/hr over 30 Minutes Intravenous On call to O.R. 10/12/23 0836 10/13/23 1401   10/13/23 1128  sodium chloride  0.9 % with cefoTEtan  (CEFOTAN ) ADS Med       Note to Pharmacy: Susann Eon: cabinet override  10/13/23 1128 10/13/23 1357   10/12/23 1300  metroNIDAZOLE  (FLAGYL ) tablet 500 mg        500 mg Oral 3 times daily 10/11/23 1212 10/12/23 2116   10/12/23 1300  neomycin   (MYCIFRADIN ) tablet 1,000 mg        1,000 mg Oral 3 times daily 10/11/23 1212 10/12/23 2136   10/11/23 1300  neomycin  (MYCIFRADIN ) tablet 1,000 mg  Status:  Discontinued        1,000 mg Oral 3 times daily 10/11/23 0839 10/11/23 1212   10/11/23 1300  metroNIDAZOLE  (FLAGYL ) tablet 500 mg  Status:  Discontinued        500 mg Oral 3 times daily 10/11/23 0839 10/11/23 1212   10/07/23 1500  piperacillin -tazobactam (ZOSYN ) IVPB 3.375 g        3.375 g 12.5 mL/hr over 240 Minutes Intravenous Every 8 hours 10/07/23 1356         Subjective: Patient seen and evaluated today with no new acute complaints or concerns.  Heart rates are significantly improved and she appears to be in sinus rhythm this morning.  She is eager to try to have something to eat.  Objective: Vitals:   10/18/23 0900 10/18/23 1000 10/18/23 1100 10/18/23 1200  BP: (!) 168/58 138/62 (!) 140/70 (!) 140/64  Pulse: 79 80 84 80  Resp: 19 15 15 14   Temp:      TempSrc:      SpO2: 99% 100% 100% 99%  Weight:      Height:        Intake/Output Summary (Last 24 hours) at 10/18/2023 1331 Last data filed at 10/18/2023 1328 Gross per 24 hour  Intake 1659.16 ml  Output 2225 ml  Net -565.84 ml   Filed Weights   10/16/23 0500 10/17/23 0500 10/18/23 0500  Weight: 87.6 kg 87.4 kg 85.1 kg    Examination:  General exam: Appears calm and comfortable  Respiratory system: Clear to auscultation. Respiratory effort normal. Cardiovascular system: S1 & S2 heard, regular Gastrointestinal system: Abdomen with JP drain and ostomy with honeycomb dressing Central nervous system: Alert and awake Extremities: Diffuse edema to upper and lower extremities noted, nonpitting Skin: No significant lesions noted Psychiatry: Flat affect. Foley with clear, yellow urine output noted    Data Reviewed: I have personally reviewed following labs and imaging studies  CBC: Recent Labs  Lab 10/15/23 0456 10/15/23 0719 10/15/23 1818 10/16/23 0620  10/17/23 0407 10/18/23 0848 10/18/23 0951  WBC 10.1  --   --  8.4 7.5 7.0 7.9  HGB 6.8*   < > 10.8* 10.1* 10.9* 9.1* 10.2*  HCT 20.7*   < > 30.0* 29.0* 32.4* 28.9* 32.0*  MCV 90.0  --   --  88.4 89.5 97.3 93.0  PLT 135*  --   --  128* 131* 118* 127*   < > = values in this interval not displayed.   Basic Metabolic Panel: Recent Labs  Lab 10/14/23 0443 10/15/23 0456 10/16/23 0620 10/17/23 0407 10/18/23 0951  NA 136 132* 130* 132* 129*  K 4.0 3.5 3.8 3.8 4.3  CL 108 101 96* 96* 93*  CO2 22 24 28  32 28  GLUCOSE 144* 122* 108* 123* 119*  BUN 16 23 28* 33* 40*  CREATININE 1.40* 1.63* 1.56* 1.36* 1.21*  CALCIUM 7.7* 8.2* 7.8* 8.0* 8.2*  MG 1.6* 1.9 1.6* 1.9 1.7  PHOS 3.1  --   --  2.4*  --    GFR: Estimated Creatinine Clearance: 40.5 mL/min (A) (by C-G  formula based on SCr of 1.21 mg/dL (H)). Liver Function Tests: Recent Labs  Lab 10/14/23 0443 10/15/23 0456 10/17/23 0407  AST 18 14* 14*  ALT 14 12 11   ALKPHOS 29* 23* 33*  BILITOT 0.5 0.3 0.5  PROT 3.8* 4.5* 4.8*  ALBUMIN  1.9* 2.8* 2.3*   No results for input(s): "LIPASE", "AMYLASE" in the last 168 hours.  No results for input(s): "AMMONIA" in the last 168 hours. Coagulation Profile: No results for input(s): "INR", "PROTIME" in the last 168 hours. Cardiac Enzymes: No results for input(s): "CKTOTAL", "CKMB", "CKMBINDEX", "TROPONINI" in the last 168 hours. BNP (last 3 results) No results for input(s): "PROBNP" in the last 8760 hours. HbA1C: No results for input(s): "HGBA1C" in the last 72 hours. CBG: Recent Labs  Lab 10/16/23 2321 10/17/23 0801 10/17/23 1706 10/17/23 2338 10/18/23 0750  GLUCAP 146* 153* 140* 141* 120*   Lipid Profile: Recent Labs    10/17/23 0407  TRIG 122   Thyroid Function Tests: No results for input(s): "TSH", "T4TOTAL", "FREET4", "T3FREE", "THYROIDAB" in the last 72 hours. Anemia Panel: No results for input(s): "VITAMINB12", "FOLATE", "FERRITIN", "TIBC", "IRON", "RETICCTPCT" in  the last 72 hours. Sepsis Labs: No results for input(s): "PROCALCITON", "LATICACIDVEN" in the last 168 hours.  Recent Results (from the past 240 hours)  Aerobic/Anaerobic Culture w Gram Stain (surgical/deep wound)     Status: None (Preliminary result)   Collection Time: 10/13/23  3:58 PM   Specimen: Path fluid; GI  Result Value Ref Range Status   Specimen Description ABSCESS  Final   Special Requests INTRAABDOMINAL  Final   Gram Stain   Final    FEW WBC PRESENT, PREDOMINANTLY PMN NO ORGANISMS SEEN Performed at [facility], [address] [place], [place] [address]    Culture   Final    RARE CANDIDA ALBICANS NO ANAEROBES ISOLATED; CULTURE IN PROGRESS FOR 5 DAYS    Report Status PENDING  Incomplete         Radiology Studies: No results found.      Scheduled Meds:  sodium chloride    Intravenous Once   acetaminophen   1,000 mg Oral Q6H   Chlorhexidine  Gluconate Cloth  6 each Topical Daily   feeding supplement  1 Container Oral BID BM   insulin  aspart  0-15 Units Subcutaneous Q8H   levothyroxine   50 mcg Oral Daily   metoprolol  tartrate  5 mg Intravenous Q6H   pantoprazole  (PROTONIX ) IV  40 mg Intravenous QHS   sodium chloride  flush  10-40 mL Intracatheter Q12H   Continuous Infusions:  amiodarone  30 mg/hr (10/18/23 0636)   heparin  950 Units/hr (10/18/23 1008)   piperacillin -tazobactam (ZOSYN )  IV 12.5 mL/hr at 10/18/23 0636   TPN ADULT (ION) 75 mL/hr at 10/18/23 0636   TPN ADULT (ION)       LOS: 11 days    Total care time: 55 minutes    Lisabeth Mian Loran Rock, DO [facility]  If 7PM-7AM, please contact night-coverage [email or web address] 10/18/2023, 1:31 PM

## 2023-10-19 ENCOUNTER — Inpatient Hospital Stay (HOSPITAL_COMMUNITY)

## 2023-10-19 DIAGNOSIS — N189 Chronic kidney disease, unspecified: Secondary | ICD-10-CM

## 2023-10-19 DIAGNOSIS — K920 Hematemesis: Secondary | ICD-10-CM | POA: Diagnosis not present

## 2023-10-19 DIAGNOSIS — K921 Melena: Secondary | ICD-10-CM | POA: Diagnosis not present

## 2023-10-19 DIAGNOSIS — K922 Gastrointestinal hemorrhage, unspecified: Secondary | ICD-10-CM | POA: Diagnosis not present

## 2023-10-19 DIAGNOSIS — I4891 Unspecified atrial fibrillation: Secondary | ICD-10-CM

## 2023-10-19 DIAGNOSIS — E876 Hypokalemia: Secondary | ICD-10-CM

## 2023-10-19 DIAGNOSIS — N179 Acute kidney failure, unspecified: Secondary | ICD-10-CM

## 2023-10-19 LAB — GLUCOSE, CAPILLARY
Glucose-Capillary: 141 mg/dL — ABNORMAL HIGH (ref 70–99)
Glucose-Capillary: 107 mg/dL — ABNORMAL HIGH (ref 70–99)
Glucose-Capillary: 124 mg/dL — ABNORMAL HIGH (ref 70–99)

## 2023-10-19 LAB — BASIC METABOLIC PANEL WITH GFR
Anion gap: 7 (ref 5–15)
BUN: 50 mg/dL — ABNORMAL HIGH (ref 8–23)
CO2: 25 mmol/L (ref 22–32)
Calcium: 7.7 mg/dL — ABNORMAL LOW (ref 8.9–10.3)
Chloride: 99 mmol/L (ref 98–111)
Creatinine, Ser: 1.17 mg/dL — ABNORMAL HIGH (ref 0.44–1.00)
GFR, Estimated: 48 mL/min — ABNORMAL LOW (ref >60)
Glucose, Bld: 170 mg/dL — ABNORMAL HIGH (ref 70–99)
Potassium: 4.7 mmol/L (ref 3.5–5.1)
Sodium: 135 mmol/L (ref 135–145)

## 2023-10-19 LAB — CBC
HCT: 20.3 % — ABNORMAL LOW (ref 36.0–46.0)
HCT: 21.4 % — ABNORMAL LOW (ref 36.0–46.0)
Hemoglobin: 6.4 g/dL — CL (ref 12.0–15.0)
Hemoglobin: 7.4 g/dL — ABNORMAL LOW (ref 12.0–15.0)
MCH: 29.9 pg (ref 26.0–34.0)
MCH: 31.2 pg (ref 26.0–34.0)
MCHC: 31.5 g/dL (ref 30.0–36.0)
MCHC: 34.6 g/dL (ref 30.0–36.0)
MCV: 94.9 fL (ref 80.0–100.0)
MCV: 90.3 fL (ref 80.0–100.0)
Platelets: 146 10*3/uL — ABNORMAL LOW (ref 150–400)
Platelets: 127 10*3/uL — ABNORMAL LOW (ref 150–400)
RBC: 2.14 MIL/uL — ABNORMAL LOW (ref 3.87–5.11)
RBC: 2.37 MIL/uL — ABNORMAL LOW (ref 3.87–5.11)
RDW: 16.4 % — ABNORMAL HIGH (ref 11.5–15.5)
RDW: 16.1 % — ABNORMAL HIGH (ref 11.5–15.5)
WBC: 9.2 10*3/uL (ref 4.0–10.5)
WBC: 10.4 10*3/uL (ref 4.0–10.5)
nRBC: 0 % (ref 0.0–0.2)
nRBC: 0 % (ref 0.0–0.2)

## 2023-10-19 LAB — PREPARE RBC (CROSSMATCH)

## 2023-10-19 LAB — PHOSPHORUS: Phosphorus: 1.6 mg/dL — ABNORMAL LOW (ref 2.5–4.6)

## 2023-10-19 LAB — HEMOGLOBIN AND HEMATOCRIT, BLOOD
HCT: 20.1 % — ABNORMAL LOW (ref 36.0–46.0)
HCT: 17 % — ABNORMAL LOW (ref 36.0–46.0)
Hemoglobin: 7 g/dL — ABNORMAL LOW (ref 12.0–15.0)
Hemoglobin: 5.9 g/dL — CL (ref 12.0–15.0)

## 2023-10-19 LAB — MAGNESIUM: Magnesium: 1.6 mg/dL — ABNORMAL LOW (ref 1.7–2.4)

## 2023-10-19 LAB — OCCULT BLOOD X 1 CARD TO LAB, STOOL: Fecal Occult Bld: POSITIVE — AB

## 2023-10-19 LAB — HEPARIN LEVEL (UNFRACTIONATED)
Heparin Unfractionated: 0.6 [IU]/mL (ref 0.30–0.70)
Heparin Unfractionated: 0.67 [IU]/mL (ref 0.30–0.70)

## 2023-10-19 MED ORDER — PANTOPRAZOLE SODIUM 40 MG IV SOLR
40.0000 mg | Freq: Two times a day (BID) | INTRAVENOUS | Status: DC
  Administered 2023-10-19 – 2023-10-25 (×13): 40 mg via INTRAVENOUS
  Filled 2023-10-19 (×13): qty 10

## 2023-10-19 MED ORDER — TRAVASOL 10 % IV SOLN
INTRAVENOUS | Status: AC
Start: 2023-10-19 — End: 2023-10-20
  Filled 2023-10-19: qty 900

## 2023-10-19 MED ORDER — AMIODARONE LOAD VIA INFUSION
150.0000 mg | Freq: Once | INTRAVENOUS | Status: AC
  Administered 2023-10-19: 150 mg via INTRAVENOUS
  Filled 2023-10-19: qty 83.34

## 2023-10-19 MED ORDER — SODIUM PHOSPHATES 45 MMOLE/15ML IV SOLN
20.0000 mmol | Freq: Once | INTRAVENOUS | Status: AC
  Administered 2023-10-19: 20 mmol via INTRAVENOUS
  Filled 2023-10-19: qty 6.67

## 2023-10-19 MED ORDER — MAGNESIUM SULFATE IN D5W 1-5 GM/100ML-% IV SOLN
1.0000 g | Freq: Once | INTRAVENOUS | Status: AC
  Administered 2023-10-19: 1 g via INTRAVENOUS
  Filled 2023-10-19: qty 100

## 2023-10-19 MED ORDER — IOHEXOL 350 MG/ML SOLN
100.0000 mL | Freq: Once | INTRAVENOUS | Status: AC | PRN
Start: 2023-10-19 — End: 2023-10-19
  Administered 2023-10-19: 100 mL via INTRAVENOUS

## 2023-10-19 MED ORDER — SODIUM CHLORIDE 0.9% IV SOLUTION: Freq: Once | INTRAVENOUS | Status: AC

## 2023-10-19 MED ORDER — INSULIN ASPART 100 UNIT/ML IJ SOLN: 0.0000 [IU] | Freq: Three times a day (TID) | INTRAMUSCULAR | Status: DC

## 2023-10-19 MED ORDER — INSULIN ASPART 100 UNIT/ML IJ SOLN
0.0000 [IU] | Freq: Three times a day (TID) | INTRAMUSCULAR | Status: DC
  Administered 2023-10-19: 2 [IU] via SUBCUTANEOUS
  Administered 2023-10-20: 3 [IU] via SUBCUTANEOUS
  Administered 2023-10-20: 5 [IU] via SUBCUTANEOUS
  Administered 2023-10-20: 3 [IU] via SUBCUTANEOUS
  Administered 2023-10-21 – 2023-10-24 (×7): 2 [IU] via SUBCUTANEOUS

## 2023-10-19 NOTE — Hospital Course (Addendum)
 78 y.o. female with medical history significant of hypothyroidism, hypertension, chronic kidney disease stage IIIa, class I obesity, atrial fibrillation and recent hospitalization secondary to diverticulitis with perforation and contained abscess status post drain placement and enteral fistula formation; who presented to the hospital secondary to ongoing abdominal pain, associated nausea/intermittent vomiting and difficulty keeping things down.  Patient has been admitted with diverticulitis of the colon with perforation and enterocolonic fistula.  She is status post Hartman's procedure with small bowel resection and ileocecectomy on 5/15.  Patient remains on TPN and Lasix  and continues to have some elevated heart rates requiring recurrent amiodarone  bolus and ongoing drip as well as IV metoprolol .  Surgery team following and Cardiology consulted for assistance in management as patient remains on IV amiodarone  infusion.   Once her HR was under controlled.  She was transitioned to po amiodarone .  Her HR remained controlled.  She continued to improve clinically and her diet was advanced and TPN was stopped.

## 2023-10-19 NOTE — H&P (View-Only) (Signed)
 NP  Gastroenterology Consult   Referring Provider: No ref. provider found Primary Care Physician:  [name], [name], MD Primary Gastroenterologist:  Dr.Carver   Patient ID: Felicia Frank; [id number]; 1946/05/07   Admit date: 10/07/2023  LOS: 12 days   Date of Consultation: 10/19/2023  Reason for Consultation: melena, coffee ground emesis  History of Present Illness   Felicia Frank is a 78 y.o. year old female with medical history of hypothyroidism, hypertension, chronic kidney disease stage IIIa, class I obesity, A fib and recent hospitalization secondary to diverticulitis with perforation and contained abscess s/p drain placement and enteral fistula formation; who presented to the hospital on 5/9 with ongoing abdominal pain, associated nausea/vomiting. She underwent Hartman's procedure with small bowel resection and ileocecectomy on 5/15. Patient now presenting with large amount of black emesis and black stool in ostomy bag with hgb decline from 10.3 to 7.3, CTA with no active bleeding, though intraluminal blood product within small bowel in region of anastomosis resulting in partial SBO. GI consulted for further evaluation  Consult: Had some nausea with episode of emesis which was dark/black in color and darker/black stool in ostomy bag. She has some ongoing abdominal discomfort though mostly well controlled, also with nausea, no vomiting today.    Past Medical History:  Diagnosis Date   Diverticulosis    HTN (hypertension)    Hypothyroidism     Past Surgical History:  Procedure Laterality Date   ABDOMINAL HYSTERECTOMY     BACK SURGERY     CHOLECYSTECTOMY     COLECTOMY WITH COLOSTOMY CREATION/HARTMANN PROCEDURE N/A 10/13/2023   Procedure: PARTIAL COLECTOMY, WITH COLOSTOMY CREATION, PARTIAL SMALL BOWEL RESECTION, ILEOCECECTOMY;  Surgeon: [name], MD;  Location: [facility];  Service: General;  Laterality: N/A;   TONSILLECTOMY      Prior to Admission medications    Medication Sig Start Date End Date Taking? Authorizing Provider  amiodarone  (PACERONE ) 200 MG tablet Take 1 tablet (200 mg total) by mouth daily. 09/29/23 11/28/23 Yes Shah, Pratik D, DO  apixaban  (ELIQUIS ) 5 MG TABS tablet Take 1 tablet (5 mg total) by mouth 2 (two) times daily. 09/28/23  Yes Shah, Pratik D, DO  doxazosin  (CARDURA ) 8 MG tablet Take 8 mg by mouth at bedtime. 12/02/20  Yes [provider]  levothyroxine  (SYNTHROID ) 50 MCG tablet Take 50 mcg by mouth daily. 03/15/17  Yes [provider]  metoprolol  tartrate (LOPRESSOR ) 50 MG tablet Take 1 tablet (50 mg total) by mouth 2 (two) times daily. 09/28/23 11/27/23 Yes Shah, Pratik D, DO  ondansetron  (ZOFRAN ) 4 MG tablet Take 1 tablet (4 mg total) by mouth daily as needed for nausea or vomiting. 09/28/23 09/27/24 Yes Shah, Pratik D, DO  senna-docusate (SENOKOT-S) 8.6-50 MG tablet Take 2 tablets by mouth 2 (two) times daily. 09/28/23 10/28/23 Yes Shah, Pratik D, DO  traMADol  (ULTRAM ) 50 MG tablet Take 1 tablet (50 mg total) by mouth every 12 (twelve) hours as needed. 09/28/23  Yes [name], MD  zolpidem  (AMBIEN ) 10 MG tablet Take 10 mg by mouth at bedtime as needed for sleep. 12/11/20  Yes [provider]  estradiol (ESTRACE) 0.1 MG/GM vaginal cream Place 0.5 g vaginally as needed (vaginal dryness/irritation). Twice a week PRN 11/06/12   [provider]  furosemide  (LASIX ) 20 MG tablet Take 1 tablet every day by oral route for 30 days. Patient not taking: Reported on 10/07/2023 09/30/23   [provider]    Current Facility-Administered Medications  Medication Dose Route Frequency Provider Last  Rate Last Admin   0.9 %  sodium chloride  infusion (Manually program via Guardrails IV Fluids)   Intravenous Once [name], MD       acetaminophen  (TYLENOL ) tablet 1,000 mg  1,000 mg Oral Q6H Pappayliou, Catherine A, DO   1,000 mg at 10/18/23 1651   amiodarone  (NEXTERONE  PREMIX) 360-4.14 MG/200ML-% (1.8 mg/mL) IV  infusion  30 mg/hr Intravenous Continuous [name], MD 16.67 mL/hr at 10/19/23 0451 30 mg/hr at 10/19/23 0451   Chlorhexidine  Gluconate Cloth 2 % PADS 6 each  6 each Topical Daily [name], MD   6 each at 10/19/23 1004   feeding supplement (BOOST / RESOURCE BREEZE) liquid 1 Container  1 Container Oral BID BM Mason Sole, Pratik D, DO   1 Container at 10/18/23 1634   HYDROmorphone  (DILAUDID ) injection 1 mg  1 mg Intravenous Q3H PRN [name], MD   1 mg at 10/19/23 0210   insulin  aspart (novoLOG ) injection 0-15 Units  0-15 Units Subcutaneous Q8H Johnson, Clanford L, MD       levothyroxine  (SYNTHROID ) tablet 50 mcg  50 mcg Oral Daily [name], MD   50 mcg at 10/19/23 1308   metoprolol  tartrate (LOPRESSOR ) injection 5 mg  5 mg Intravenous Q6H Shah, Pratik D, DO   5 mg at 10/19/23 0528   ondansetron  (ZOFRAN ) tablet 4 mg  4 mg Oral Q6H PRN [name], MD       Or   ondansetron  (ZOFRAN ) injection 4 mg  4 mg Intravenous Q6H PRN [name], MD   4 mg at 10/19/23 1003   oxyCODONE  (Oxy IR/ROXICODONE ) immediate release tablet 5 mg  5 mg Oral Q4H PRN Pappayliou, Catherine A, DO   5 mg at 10/16/23 0601   pantoprazole  (PROTONIX ) injection 40 mg  40 mg Intravenous Q12H Adefeso, Oladapo, DO   40 mg at 10/19/23 6578   piperacillin -tazobactam (ZOSYN ) IVPB 3.375 g  3.375 g Intravenous Q8H Shah, Pratik D, DO 12.5 mL/hr at 10/19/23 0529 3.375 g at 10/19/23 4696   prochlorperazine  (COMPAZINE ) injection 10 mg  10 mg Intravenous Q6H PRN [name], MD   10 mg at 10/18/23 2338   sodium chloride  flush (NS) 0.9 % injection 10-40 mL  10-40 mL Intracatheter Q12H [name], MD   10 mL at 10/19/23 1004   sodium chloride  flush (NS) 0.9 % injection 10-40 mL  10-40 mL Intracatheter PRN [name], MD       sodium phosphate  20 mmol in sodium chloride  0.9 % 250 mL infusion  20 mmol Intravenous Once Johnson, Clanford L, MD       TPN ADULT (ION)   Intravenous Continuous TPN Doreene Gammon D, DO 75 mL/hr at  10/19/23 0451 Infusion Verify at 10/19/23 0451   TPN ADULT (ION)   Intravenous Continuous TPN Faustino Hook L, MD        Allergies as of 10/07/2023 - Review Complete 10/07/2023  Allergen Reaction Noted   Amlodipine Swelling 06/04/2020   Clonidine Rash 06/04/2020    Family History  Problem Relation Age of Onset   Colon cancer Neg Hx    Colon polyps Neg Hx     Social History   Socioeconomic History   Marital status: Married    Spouse name: Not on file   Number of children: Not on file   Years of education: Not on file   Highest education level: Not on file  Occupational History   Not on file  Tobacco Use   Smoking status: Never  Smokeless tobacco: Never  Vaping Use   Vaping status: Never Used  Substance and Sexual Activity   Alcohol use: Never   Drug use: Never   Sexual activity: Not on file  Other Topics Concern   Not on file  Social History Narrative   Not on file   Social Drivers of Health   Financial Resource Strain: Not on file  Food Insecurity: No Food Insecurity (10/08/2023)   Hunger Vital Sign    Worried About Running Out of Food in the Last Year: Never true    Ran Out of Food in the Last Year: Never true  Transportation Needs: No Transportation Needs (10/08/2023)   PRAPARE - Administrator, Civil Service (Medical): No    Lack of Transportation (Non-Medical): No  Physical Activity: Not on file  Stress: Not on file  Social Connections: Socially Integrated (10/08/2023)   Social Connection and Isolation Panel [NHANES]    Frequency of Communication with Friends and Family: More than three times a week    Frequency of Social Gatherings with Friends and Family: More than three times a week    Attends Religious Services: More than 4 times per year    Active Member of [employer] or Organizations: Yes    Attends Engineer, structural: More than 4 times per year    Marital Status: Married  Catering manager Violence: Not At Risk (10/08/2023)    Humiliation, Afraid, Rape, and Kick questionnaire    Fear of Current or Ex-Partner: No    Emotionally Abused: No    Physically Abused: No    Sexually Abused: No     Review of Systems   Gen: Denies any fever, chills, loss of appetite, change in weight or weight loss CV: Denies chest pain, heart palpitations, syncope, edema  Resp: Denies shortness of breath with rest, cough, wheezing, coughing up blood, and pleurisy. GI: Denies vomiting blood, jaundice, and fecal incontinence.   Denies dysphagia or odynophagia. GU : Denies urinary burning, blood in urine, urinary frequency, and urinary incontinence. MS: Denies joint pain, limitation of movement, swelling, cramps, and atrophy.  Derm: Denies rash, itching, dry skin, hives. Psych: Denies depression, anxiety, memory loss, hallucinations, and confusion. Heme: Denies bruising or bleeding Neuro:  Denies any headaches, dizziness, paresthesias, shaking  Physical Exam   Vital Signs in last 24 hours: Temp:  [97.9 F (36.6 C)-99.1 F (37.3 C)] 99.1 F (37.3 C) (05/21 0730) Pulse Rate:  [75-148] 96 (05/21 0700) Resp:  [11-30] 20 (05/21 0700) BP: (53-145)/(25-105) 99/56 (05/21 0700) SpO2:  [96 %-100 %] 98 % (05/21 0700) Weight:  [85.2 kg] 85.2 kg (05/21 0430) Last BM Date : 10/16/23  General:   Alert,  Well-developed, well-nourished, pleasant and cooperative in NAD Head:  Normocephalic and atraumatic. Eyes:  Sclera clear, no icterus.   Conjunctiva pink. Lungs:  Clear throughout to auscultation.   No wheezes, crackles, or rhonchi. No acute distress. Heart:  Regular rate and rhythm; no murmurs, clicks, rubs,  or gallops. Abdomen:  Soft, nontender and nondistended.midline incision with honeycomb dressing in place, ostomy to left abdomen with melanotic stool in bag, JP darin to right side of abdomen, some serosanguineous fluid Neurologic:  Alert and  oriented x4. Skin:  Intact without significant lesions or rashes. Psych:  Alert and  cooperative. Normal mood and affect.  Intake/Output from previous day: 05/20 0701 - 05/21 0700 In: 1516.9 [I.V.:1380.8; IV Piggyback:136.1] Out: 1610 [Urine:1000; Drains:10; Stool:600] Intake/Output this shift: Total I/O In: -  Out:  500 [Urine:500]   Labs/Studies   Recent Labs Recent Labs    10/18/23 0848 10/18/23 0951 10/19/23 0134  WBC 7.0 7.9 9.2  HGB 9.1* 10.2* 6.4*  HCT 28.9* 32.0* 20.3*  PLT 118* 127* 146*   BMET Recent Labs    10/17/23 0407 10/18/23 0951 10/19/23 0016  NA 132* 129* 135  K 3.8 4.3 4.7  CL 96* 93* 99  CO2 32 28 25  GLUCOSE 123* 119* 170*  BUN 33* 40* 50*  CREATININE 1.36* 1.21* 1.17*  CALCIUM 8.0* 8.2* 7.7*   LFT Recent Labs    10/17/23 0407  PROT 4.8*  ALBUMIN  2.3*  AST 14*  ALT 11  ALKPHOS 33*  BILITOT 0.5    Radiology/Studies CT ANGIO GI BLEED Result Date: 10/19/2023 CLINICAL DATA:  Lower gastrointestinal hemorrhage EXAM: CTA ABDOMEN AND PELVIS WITHOUT AND WITH CONTRAST TECHNIQUE: Multidetector CT imaging of the abdomen and pelvis was performed using the standard protocol during bolus administration of intravenous contrast. Multiplanar reconstructed images and MIPs were obtained and reviewed to evaluate the vascular anatomy. RADIATION DOSE REDUCTION: This exam was performed according to the departmental dose-optimization program which includes automated exposure control, adjustment of the mA and/or kV according to patient size and/or use of iterative reconstruction technique. CONTRAST:  OMNIPAQUE  IOHEXOL  350 MG/ML SOLN COMPARISON:  None Available. FINDINGS: VASCULAR Aorta: Normal caliber aorta without aneurysm, dissection, vasculitis or significant stenosis. Moderate atherosclerotic calcification. Celiac: Patent without evidence of aneurysm, dissection, vasculitis or significant stenosis. SMA: Patent without evidence of aneurysm, dissection, vasculitis or significant stenosis. Renals: Main renal arteries bilaterally are relatively  diminutive. Right main renal artery is widely patent. 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 Diminutive accessory renal arteries are seen bilaterally. Normal vascular morphology. No aneurysm or dissection. IMA: Patent without evidence of aneurysm, dissection, vasculitis or significant stenosis. Inflow: Patent without evidence of aneurysm, dissection, vasculitis or significant stenosis. Proximal Outflow: Bilateral common femoral and visualized portions of the superficial and profunda femoral arteries are patent without evidence of aneurysm, dissection, vasculitis or significant stenosis. Veins: Unremarkable Review of the MIP images confirms the above findings. NON-VASCULAR Lower chest: Small bilateral pleural effusions. Cardiac size within normal limits. Small hiatal hernia. Hepatobiliary: No focal liver abnormality is seen. Status post cholecystectomy. No biliary dilatation. Pancreas: Unremarkable Spleen: Unremarkable Adrenals/Urinary Tract: Adrenal glands are unremarkable. Simple exophytic cortical cyst is seen within the lower pole the right kidney for which no follow-up imaging is recommended. The kidneys are otherwise unremarkable. Bladder unremarkable. Stomach/Bowel: Partial small bowel and large bowel resection are seen with small bowel anastomosis within the left lower quadrant and enterocolic anastomosis within the right lower quadrant. 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 There is high density intraluminal enteric contents within the small bowel in the region of the small bowel anastomosis compatible with blood product resulting in a partial small bowel obstruction, however, there is no active gastrointestinal hemorrhage identified. Surgical drainage catheter seen within the deep pelvis. No free intraperitoneal gas or fluid. No loculated intra-abdominal fluid collections. Infiltration within  the right lower quadrant small bowel mesentery as well as extraluminal retroperitoneal gas within left lower quadrant is likely postsurgical in nature. Punctate foci of gas within the laparotomy incision within the anterior abdominal wall are nonspecific. Lymphatic: No pathologic adenopathy within the abdomen and pelvis. Reproductive: Status post hysterectomy. No adnexal masses. Other: No abdominal wall hernia Musculoskeletal: Stable subacute appearing superior endplate fracture of L1. No  acute bone abnormality. No lytic or blastic bone lesion. Osseous structures are age appropriate. IMPRESSION: 1. No active gastrointestinal hemorrhage identified. 2. Intraluminal blood product within the small bowel in the region of the small bowel anastomosis resulting in a partial small bowel obstruction. 3. Surgical changes of partial small and large bowel resection with descending colostomy and Hartmann pouch formation. 4. Stable subacute appearing superior endplate fracture of L1. Aortic Atherosclerosis (ICD10-I70.0). Electronically Signed   By: Worthy Heads M.D.   On: 10/19/2023 04:24    Assessment   Shanetta Nicolls is a 78 y.o. year old female with recent hospitalization secondary to diverticulitis with perforation and contained abscess s/p drain placement and enteral fistula formation; who presented to the hospital on 5/9 with ongoing abdominal pain, associated nausea/vomiting. She underwent Hartman's procedure with small bowel resection and ileocecectomy on 5/15. now presenting with large amount of black emesis and black stool in ostomy bag with hgb decline from 10.2 to 6.4. GI consulted for melena, coffee ground emesis  Hematemesis/melena: s/p hartmans procedure with small bowel resection and ileocecetomy on 5/15.  -Onset of black emesis/melena overnight into this morning  - decline in hemoglobin from 10.2 to 6.4. s/p 1 unit PRBCs.  -Repeat hgb pending -CTA without obvious bleeding though with though  intraluminal blood product within small bowel in region of anastomosis resulting in partial SBO.  -still with some melanotic stool in ostomy bag, no further emesis -general surgery has seen patient, not recommending endoscopic evaluations at this time   Plan / Recommendations   -Monitor for ongoing melena/GI bleeding -Trend h&h, transfuse as needed for hgb 7 or less -Conservative measures for SBO/GI bleeding for now, per gen surgery recommendations as well -recommended holding heparin  drip in light of bleeding - continued supportive management of bleeding/SBO for now -consider NG tube placement if recurrent emesis  -on TPN, will follow Gen surg recommendations on diet/ PO status, sips of water for now  GI will follow peripherally for now    10/19/2023, 10:07 AM  Chelsea L. Carlan, MSN, APRN, AGNP-C Adult-Gerontology Nurse Practitioner [facility]

## 2023-10-19 NOTE — Progress Notes (Addendum)
 9:31 Patient not voided since since 2 pm yesterday, nursing has done in and out 2 times already since then and now her bladder scan shows . Patient filling pelvic fullness and not able to void.   Will replace foley for persistent urinary retention.   10:09 Follow up hgb is 5.9, patient continue to have bloody output per colostomy, ordering PRBC transfusion x1.

## 2023-10-19 NOTE — Progress Notes (Signed)
 PROGRESS NOTE   Felicia Frank  JWJ:[id number] DOB: Oct 01, 1945 DOA: 10/07/2023 PCP: Allana Ishikawa, MD   Chief Complaint  Patient presents with   Abdominal Pain   Level of care: Stepdown  Brief Admission History:  78 y.o. female with medical history significant of hypothyroidism, hypertension, chronic kidney disease stage IIIa, class I obesity, atrial fibrillation and recent hospitalization secondary to diverticulitis with perforation and contained abscess status post drain placement and enteral fistula formation; who presented to the hospital secondary to ongoing abdominal pain, associated nausea/intermittent vomiting and difficulty keeping things down.  Patient has been admitted with diverticulitis of the colon with perforation and enterocolonic fistula.  She is status post Hartman's procedure with small bowel resection and ileocecectomy on 5/15.  Patient remains on TPN and Lasix  and continues to have some elevated heart rates requiring recurrent amiodarone  bolus and ongoing drip as well as IV metoprolol .  Surgery team following and Cardiology consulted for assistance in management as patient remains on IV amiodarone  infusion.      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 -Continue current IV antibiotics with Zosyn  for 5 days postoperatively thru 5/21 - Appreciate general surgery recommendations to advance to clear liquid diet and remove NG tube and continue TPN until tolerating solid food. - Started on Lasix  5/16 and -5.1 L fluid balance noted in the last 24 hours with stable creatinine levels with -7.8 L fluid balance since initiation of Lasix , hold further doses for now -Discontinue Foley catheter and monitor voiding output    Chronic paroxysmal atrial fibrillation with RVR-poorly controlled - Will continue the use of adjusted dose IV metoprolol  and  SR amiodarone  drip with loading dose repeated on 5/19 -Consult to cardiology for further assistance in management - IV heparin  stopped on 10/19/23 due to drop in Hg - continue IV amiodarone  infusion, cardiology rebolused on 5/20 and increased rate to 60 ml/hr, now working to wean back down to 30 mL/hr as heart rates have improved.    Acute hematemesis - pt reported vomited large amounts of black vomitus (500 mL) overnight; hg down to 6.4. CT angio GI bleed study negative for GI bleeding.  Intraluminal blood product noted within the small bowel in the region of the small bowel anastomosis resulting in a partial SBO.   - Pt is being treated supportively  - transfused 1 unit PRBC. Repeat CBC pending.    Acute blood loss anemia-Hg declined to 6.4 after restarting IV heparin  infusion - Status post 2 unit PRBC transfusion 5/17  - s/p 1 unit PRBC on 5/21  - recheck CBC pending    essential hypertension - Continue to monitor    hypothyroidism - Continue Synthroid . - Taking p.o.   acute kidney injury in the setting of chronic kidney disease stage IIIa - Creatinine improving with diuresis - Continue to monitor closely   GERD - Continue PPI   compression fracture of L1 - Continue as needed analgesia - Will recommend continued use of TLSO brace at discharge.   class I obesity -Body mass index is 32.28 kg/m. - Low-calorie diet and portion control discussed with patient.    DVT prophylaxis: IV heparin  was resumed 5/19, stopped on 5/21 due to drop in Hg, SCDs Code Status: Full  Family Communication:  Disposition: anticipating SNF    Consultants:  Surgery Cardiology  Procedures:   Antimicrobials:    Subjective: Pt says she doesn't feel  as well today as yesterday, she is very nauseated, and she was spitting up blood overnight.    Objective: Vitals:   10/19/23 0730 10/19/23 0800 10/19/23 0900 10/19/23 1000  BP:  (!) 119/46 (!) 101/51 (!) 105/55  Pulse:  94  94  Resp:  (!) 22  18 15   Temp: 99.1 F (37.3 C)     TempSrc: Axillary     SpO2:  99%  98%  Weight:      Height:        Intake/Output Summary (Last 24 hours) at 10/19/2023 1025 Last data filed at 10/19/2023 1010 Gross per 24 hour  Intake 2124.4 ml  Output 1810 ml  Net 314.4 ml   Filed Weights   10/17/23 0500 10/18/23 0500 10/19/23 0430  Weight: 87.4 kg 85.1 kg 85.2 kg   Examination:  General exam: Appears weak but NAD, cooperative.   Respiratory system: Clear to auscultation. Respiratory effort normal. Cardiovascular system: irregularly irregular normal S1 & S2 heard. trace pedal edema. Gastrointestinal system: Abdomen is nondistended, soft and nontender. Ostomy with black output seen. Central nervous system: Alert and oriented. No focal neurological deficits. Extremities: Symmetric 5 x 5 power. Skin: No rashes, lesions or ulcers. Psychiatry: Judgement and insight appear normal. Mood & affect appropriate.   Data Reviewed: I have personally reviewed following labs and imaging studies  CBC: Recent Labs  Lab 10/16/23 0620 10/17/23 0407 10/18/23 0848 10/18/23 0951 10/19/23 0134  WBC 8.4 7.5 7.0 7.9 9.2  HGB 10.1* 10.9* 9.1* 10.2* 6.4*  HCT 29.0* 32.4* 28.9* 32.0* 20.3*  MCV 88.4 89.5 97.3 93.0 94.9  PLT 128* 131* 118* 127* 146*    Basic Metabolic Panel: Recent Labs  Lab 10/14/23 0443 10/15/23 0456 10/16/23 0620 10/17/23 0407 10/18/23 0951 10/19/23 0016  NA 136 132* 130* 132* 129* 135  K 4.0 3.5 3.8 3.8 4.3 4.7  CL 108 101 96* 96* 93* 99  CO2 22 24 28  32 28 25  GLUCOSE 144* 122* 108* 123* 119* 170*  BUN 16 23 28* 33* 40* 50*  CREATININE 1.40* 1.63* 1.56* 1.36* 1.21* 1.17*  CALCIUM 7.7* 8.2* 7.8* 8.0* 8.2* 7.7*  MG 1.6* 1.9 1.6* 1.9 1.7 1.6*  PHOS 3.1  --   --  2.4*  --  1.6*    CBG: Recent Labs  Lab 10/18/23 0750 10/18/23 1608 10/18/23 1959 10/18/23 2337 10/19/23 0804  GLUCAP 120* 131* 157* 171* 141*    Recent Results (from the past 240 hours)  Aerobic/Anaerobic  Culture w Gram Stain (surgical/deep wound)     Status: None   Collection Time: 10/13/23  3:58 PM   Specimen: Path fluid; GI  Result Value Ref Range Status   Specimen Description ABSCESS  Final   Special Requests INTRAABDOMINAL  Final   Gram Stain   Final    FEW WBC PRESENT, PREDOMINANTLY PMN NO ORGANISMS SEEN    Culture   Final    RARE CANDIDA ALBICANS NO ANAEROBES ISOLATED Performed at [facility], [address] [place], [place] [address]    Report Status 10/18/2023 FINAL  Final     Radiology Studies: CT ANGIO GI BLEED Result Date: 10/19/2023 CLINICAL DATA:  Lower gastrointestinal hemorrhage EXAM: CTA ABDOMEN AND PELVIS WITHOUT AND WITH CONTRAST TECHNIQUE: Multidetector CT imaging of the abdomen and pelvis was performed using the standard protocol during bolus administration of intravenous contrast. Multiplanar reconstructed images and MIPs were obtained and reviewed to evaluate the vascular anatomy. RADIATION DOSE REDUCTION: This exam was performed  according to the departmental dose-optimization program which includes automated exposure control, adjustment of the mA and/or kV according to patient size and/or use of iterative reconstruction technique. CONTRAST:  OMNIPAQUE  IOHEXOL  350 MG/ML SOLN COMPARISON:  None Available. FINDINGS: VASCULAR Aorta: Normal caliber aorta without aneurysm, dissection, vasculitis or significant stenosis. Moderate atherosclerotic calcification. Celiac: Patent without evidence of aneurysm, dissection, vasculitis or significant stenosis. SMA: Patent without evidence of aneurysm, dissection, vasculitis or significant stenosis. Renals: Main renal arteries bilaterally are relatively diminutive. Right main renal artery is widely patent. 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 Diminutive accessory renal arteries are seen bilaterally. Normal vascular morphology. No aneurysm or dissection. IMA:  Patent without evidence of aneurysm, dissection, vasculitis or significant stenosis. Inflow: Patent without evidence of aneurysm, dissection, vasculitis or significant stenosis. Proximal Outflow: Bilateral common femoral and visualized portions of the superficial and profunda femoral arteries are patent without evidence of aneurysm, dissection, vasculitis or significant stenosis. Veins: Unremarkable Review of the MIP images confirms the above findings. NON-VASCULAR Lower chest: Small bilateral pleural effusions. Cardiac size within normal limits. Small hiatal hernia. Hepatobiliary: No focal liver abnormality is seen. Status post cholecystectomy. No biliary dilatation. Pancreas: Unremarkable Spleen: Unremarkable Adrenals/Urinary Tract: Adrenal glands are unremarkable. Simple exophytic cortical cyst is seen within the lower pole the right kidney for which no follow-up imaging is recommended. The kidneys are otherwise unremarkable. Bladder unremarkable. Stomach/Bowel: Partial small bowel and large bowel resection are seen with small bowel anastomosis within the left lower quadrant and enterocolic anastomosis within the right lower quadrant. 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 There is high density intraluminal enteric contents within the small bowel in the region of the small bowel anastomosis compatible with blood product resulting in a partial small bowel obstruction, however, there is no active gastrointestinal hemorrhage identified. Surgical drainage catheter seen within the deep pelvis. No free intraperitoneal gas or fluid. No loculated intra-abdominal fluid collections. Infiltration within the right lower quadrant small bowel mesentery as well as extraluminal retroperitoneal gas within left lower quadrant is likely postsurgical in nature. Punctate foci of gas within the laparotomy incision within the anterior abdominal wall are nonspecific. Lymphatic: No  pathologic adenopathy within the abdomen and pelvis. Reproductive: Status post hysterectomy. No adnexal masses. Other: No abdominal wall hernia Musculoskeletal: Stable subacute appearing superior endplate fracture of L1. No acute bone abnormality. No lytic or blastic bone lesion. Osseous structures are age appropriate. IMPRESSION: 1. No active gastrointestinal hemorrhage identified. 2. Intraluminal blood product within the small bowel in the region of the small bowel anastomosis resulting in a partial small bowel obstruction. 3. Surgical changes of partial small and large bowel resection with descending colostomy and Hartmann pouch formation. 4. Stable subacute appearing superior endplate fracture of L1. Aortic Atherosclerosis (ICD10-I70.0). Electronically Signed   By: Worthy Heads M.D.   On: 10/19/2023 04:24    Scheduled Meds:  sodium chloride    Intravenous Once   acetaminophen   1,000 mg Oral Q6H   Chlorhexidine  Gluconate Cloth  6 each Topical Daily   feeding supplement  1 Container Oral BID BM   insulin  aspart  0-15 Units Subcutaneous Q8H   levothyroxine   50 mcg Oral Daily   metoprolol  tartrate  5 mg Intravenous Q6H   pantoprazole  (PROTONIX ) IV  40 mg Intravenous Q12H   sodium chloride  flush  10-40 mL Intracatheter Q12H   Continuous Infusions:  amiodarone  30 mg/hr (10/19/23 0451)   piperacillin -tazobactam (ZOSYN )  IV 3.375  g (10/19/23 0529)   sodium PHOSPHATE  IVPB (in mmol)     TPN ADULT (ION) 75 mL/hr at 10/19/23 0451   TPN ADULT (ION)       LOS: 12 days   Critical Care Procedure Note Authorized and Performed by: Olga Berthold MD  Total Critical Care time:  56 mins Due to a high probability of clinically significant, life threatening deterioration, the patient required my highest level of preparedness to intervene emergently and I personally spent this critical care time directly and personally managing the patient.  This critical care time included obtaining a history; examining the  patient, pulse oximetry; ordering and review of studies; arranging urgent treatment with development of a management plan; evaluation of patient's response of treatment; frequent reassessment; and discussions with other providers.  This critical care time was performed to assess and manage the high probability of imminent and life threatening deterioration that could result in multi-organ failure.  It was exclusive of separately billable procedures and treating other patients and teaching time.    Faustino Hook, MD How to contact the [facility] Attending or Consulting provider 7A - 7P or covering provider during after hours 7P -7A, for this patient?  Check the care team in [facility] and look for a) attending/consulting [facility] provider listed and b) the [facility] team listed Log into [email or web address] to find provider on call.  Locate the [facility] provider you are looking for under [facility] and page to a number that you can be directly reached. If you still have difficulty reaching the provider, please page the [facility] (Director on Call) for the Hospitalists listed on amion for assistance.  10/19/2023, 10:25 AM

## 2023-10-19 NOTE — Progress Notes (Signed)
 Rounding Note    Patient Name: Felicia Frank Date of Encounter: 10/19/2023  [facility] Cardiologist: New  Subjective   Vomting early AM  Inpatient Medications    Scheduled Meds:  sodium chloride    Intravenous Once   acetaminophen   1,000 mg Oral Q6H   Chlorhexidine  Gluconate Cloth  6 each Topical Daily   feeding supplement  1 Container Oral BID BM   insulin  aspart  0-15 Units Subcutaneous Q8H   levothyroxine   50 mcg Oral Daily   metoprolol  tartrate  5 mg Intravenous Q6H   pantoprazole  (PROTONIX ) IV  40 mg Intravenous Q12H   sodium chloride  flush  10-40 mL Intracatheter Q12H   Continuous Infusions:  amiodarone  30 mg/hr (10/19/23 0451)   piperacillin -tazobactam (ZOSYN )  IV 3.375 g (10/19/23 0529)   TPN ADULT (ION) 75 mL/hr at 10/19/23 0451   PRN Meds: HYDROmorphone  (DILAUDID ) injection, ondansetron  **OR** ondansetron  (ZOFRAN ) IV, oxyCODONE , prochlorperazine , sodium chloride  flush   Vital Signs    Vitals:   10/19/23 0535 10/19/23 0619 10/19/23 0700 10/19/23 0730  BP:  (!) 92/47 (!) 99/56   Pulse: 95 100 96   Resp: 15 17 20    Temp: 98.1 F (36.7 C) 98.2 F (36.8 C)  99.1 F (37.3 C)  TempSrc: Oral Oral  Axillary  SpO2: 99% 100% 98%   Weight:      Height:        Intake/Output Summary (Last 24 hours) at 10/19/2023 0814 Last data filed at 10/19/2023 0451 Gross per 24 hour  Intake 1516.9 ml  Output 1610 ml  Net -93.1 ml      10/19/2023    4:30 AM 10/18/2023    5:00 AM 10/17/2023    5:00 AM  Last 3 Weights  Weight (lbs) 187 lb 13.3 oz 187 lb 9.8 oz 192 lb 10.9 oz  Weight (kg) 85.2 kg 85.1 kg 87.4 kg      Telemetry    NSR - Personally Reviewed  ECG    N/a - Personally Reviewed  Physical Exam   GEN: No acute distress.   Neck: No JVD Cardiac: RRR, no murmurs, rubs, or gallops.  Respiratory: Clear to auscultation bilaterally. GI: Soft, nontender, non-distended  MS: 2+ bilateral LE edema Neuro:  Nonfocal  Psych: Normal affect   Labs     High Sensitivity Troponin:  No results for input(s): "TROPONINIHS" in the last 720 hours.   Chemistry Recent Labs  Lab 10/14/23 0443 10/15/23 0456 10/16/23 0620 10/17/23 0407 10/18/23 0951 10/19/23 0016  NA 136 132*   < > 132* 129* 135  K 4.0 3.5   < > 3.8 4.3 4.7  CL 108 101   < > 96* 93* 99  CO2 22 24   < > 32 28 25  GLUCOSE 144* 122*   < > 123* 119* 170*  BUN 16 23   < > 33* 40* 50*  CREATININE 1.40* 1.63*   < > 1.36* 1.21* 1.17*  CALCIUM 7.7* 8.2*   < > 8.0* 8.2* 7.7*  MG 1.6* 1.9   < > 1.9 1.7 1.6*  PROT 3.8* 4.5*  --  4.8*  --   --   ALBUMIN  1.9* 2.8*  --  2.3*  --   --   AST 18 14*  --  14*  --   --   ALT 14 12  --  11  --   --   ALKPHOS 29* 23*  --  33*  --   --  BILITOT 0.5 0.3  --  0.5  --   --   GFRNONAA 39* 32*   < > 40* 46* 48*  ANIONGAP 6 7   < > 4* 8 7   < > = values in this interval not displayed.    Lipids  Recent Labs  Lab 10/17/23 0407  TRIG 122    Hematology Recent Labs  Lab 10/18/23 0848 10/18/23 0951 10/19/23 0134  WBC 7.0 7.9 9.2  RBC 2.97* 3.44* 2.14*  HGB 9.1* 10.2* 6.4*  HCT 28.9* 32.0* 20.3*  MCV 97.3 93.0 94.9  MCH 30.6 29.7 29.9  MCHC 31.5 31.9 31.5  RDW 16.7* 16.2* 16.4*  PLT 118* 127* 146*   Thyroid No results for input(s): "TSH", "FREET4" in the last 168 hours.  BNP Recent Labs  Lab 10/18/23 0848  BNP 85.0    DDimer No results for input(s): "DDIMER" in the last 168 hours.   Radiology    CT ANGIO GI BLEED Result Date: 10/19/2023 CLINICAL DATA:  Lower gastrointestinal hemorrhage EXAM: CTA ABDOMEN AND PELVIS WITHOUT AND WITH CONTRAST TECHNIQUE: Multidetector CT imaging of the abdomen and pelvis was performed using the standard protocol during bolus administration of intravenous contrast. Multiplanar reconstructed images and MIPs were obtained and reviewed to evaluate the vascular anatomy. RADIATION DOSE REDUCTION: This exam was performed according to the departmental dose-optimization program which includes automated  exposure control, adjustment of the mA and/or kV according to patient size and/or use of iterative reconstruction technique. CONTRAST:  OMNIPAQUE  IOHEXOL  350 MG/ML SOLN COMPARISON:  None Available. FINDINGS: VASCULAR Aorta: Normal caliber aorta without aneurysm, dissection, vasculitis or significant stenosis. Moderate atherosclerotic calcification. Celiac: Patent without evidence of aneurysm, dissection, vasculitis or significant stenosis. SMA: Patent without evidence of aneurysm, dissection, vasculitis or significant stenosis. Renals: Main renal arteries bilaterally are relatively diminutive. Right main renal artery is widely patent. 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 Diminutive accessory renal arteries are seen bilaterally. Normal vascular morphology. No aneurysm or dissection. IMA: Patent without evidence of aneurysm, dissection, vasculitis or significant stenosis. Inflow: Patent without evidence of aneurysm, dissection, vasculitis or significant stenosis. Proximal Outflow: Bilateral common femoral and visualized portions of the superficial and profunda femoral arteries are patent without evidence of aneurysm, dissection, vasculitis or significant stenosis. Veins: Unremarkable Review of the MIP images confirms the above findings. NON-VASCULAR Lower chest: Small bilateral pleural effusions. Cardiac size within normal limits. Small hiatal hernia. Hepatobiliary: No focal liver abnormality is seen. Status post cholecystectomy. No biliary dilatation. Pancreas: Unremarkable Spleen: Unremarkable Adrenals/Urinary Tract: Adrenal glands are unremarkable. Simple exophytic cortical cyst is seen within the lower pole the right kidney for which no follow-up imaging is recommended. The kidneys are otherwise unremarkable. Bladder unremarkable. Stomach/Bowel: Partial small bowel and large bowel resection are seen with small bowel anastomosis within the left  lower quadrant and enterocolic anastomosis within the right lower quadrant. 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 There is high density intraluminal enteric contents within the small bowel in the region of the small bowel anastomosis compatible with blood product resulting in a partial small bowel obstruction, however, there is no active gastrointestinal hemorrhage identified. Surgical drainage catheter seen within the deep pelvis. No free intraperitoneal gas or fluid. No loculated intra-abdominal fluid collections. Infiltration within the right lower quadrant small bowel mesentery as well as extraluminal retroperitoneal gas within left lower quadrant is likely postsurgical in nature. Punctate foci of gas within the laparotomy incision  within the anterior abdominal wall are nonspecific. Lymphatic: No pathologic adenopathy within the abdomen and pelvis. Reproductive: Status post hysterectomy. No adnexal masses. Other: No abdominal wall hernia Musculoskeletal: Stable subacute appearing superior endplate fracture of L1. No acute bone abnormality. No lytic or blastic bone lesion. Osseous structures are age appropriate. IMPRESSION: 1. No active gastrointestinal hemorrhage identified. 2. Intraluminal blood product within the small bowel in the region of the small bowel anastomosis resulting in a partial small bowel obstruction. 3. Surgical changes of partial small and large bowel resection with descending colostomy and Hartmann pouch formation. 4. Stable subacute appearing superior endplate fracture of L1. Aortic Atherosclerosis (ICD10-I70.0). Electronically Signed   By: Worthy Heads M.D.   On: 10/19/2023 04:24    Cardiac Studies   08/2023 echo 1. Left ventricular ejection fraction, by estimation, is 65 to 70%. The  left ventricle has normal function. The left ventricle has no regional  wall motion abnormalities. Left ventricular diastolic parameters are   indeterminate.   2. Right ventricular systolic function is normal. The right ventricular  size is normal. Tricuspid regurgitation signal is inadequate for assessing  PA pressure.   3. The mitral valve is normal in structure. No evidence of mitral valve  regurgitation. No evidence of mitral stenosis.   4. The aortic valve is tricuspid. Aortic valve regurgitation is not  visualized. No aortic stenosis is present.   Patient Profile     Lumi Winslett is a 78 y.o. female with a hx of hypothyroidism, primary hypertension, chronic kidney disease stage IIIa, class I obesity, paroxysmal atrial fibrillation with recent hospitalization secondary to diverticulitis with perforation and contained abscess status post drain placement and internal fistula formation,  who is being seen 10/17/2023 for the evaluation of atrial fibrillation with RVR at the request of Dr Mason Sole.   Assessment & Plan    1.PAF - Afib was new diagnosis during 08/2023 admission with perforated diverticulitis. Managed my medicine team that admission. Discharged and home regimen was amio 200mg  daily, lopressor  50mg  bid. Anticoag not started in case surgery was needed in near future for bowel perforation   - recurrent issues with afib with RVR in setting of ongoing bowel perforation, she had surgery 5/15 and remains on antiobiotics for this. Has been NPO, on IV amio gtt this admission along with IV lopressot 5mg  q6 hrs.  - was started on hep gtt, with drop in Hgb will d/c - can transition to IV amio when more stable from PO intake standpoint.  - vomiting this AM, hold on oral transition. Drop in Hgb, hold on hep gtt   2. LE edema - echo LVE 65-70%, indet diastolic, normal RV function - BNP was 80s yesterday. Sats 100% on RA. SOft bp's with recent blood loss - would hold on any diuretic today.     For questions or updates, please contact [facility] Please consult [email or web address] for contact info under         Signed, Armida Lander, MD  10/19/2023, 8:14 AM

## 2023-10-19 NOTE — Consult Note (Addendum)
 NP  Gastroenterology Consult   Referring Provider: No ref. provider found Primary Care Physician:  [name], [name], MD Primary Gastroenterologist:  Dr.Carver   Patient ID: Pricilla Frank; [id number]; 1946/05/07   Admit date: 10/07/2023  LOS: 12 days   Date of Consultation: 10/19/2023  Reason for Consultation: melena, coffee ground emesis  History of Present Illness   Felicia Frank is a 78 y.o. year old female with medical history of hypothyroidism, hypertension, chronic kidney disease stage IIIa, class I obesity, A fib and recent hospitalization secondary to diverticulitis with perforation and contained abscess s/p drain placement and enteral fistula formation; who presented to the hospital on 5/9 with ongoing abdominal pain, associated nausea/vomiting. She underwent Hartman's procedure with small bowel resection and ileocecectomy on 5/15. Patient now presenting with large amount of black emesis and black stool in ostomy bag with hgb decline from 10.3 to 7.3, CTA with no active bleeding, though intraluminal blood product within small bowel in region of anastomosis resulting in partial SBO. GI consulted for further evaluation  Consult: Had some nausea with episode of emesis which was dark/black in color and darker/black stool in ostomy bag. She has some ongoing abdominal discomfort though mostly well controlled, also with nausea, no vomiting today.    Past Medical History:  Diagnosis Date   Diverticulosis    HTN (hypertension)    Hypothyroidism     Past Surgical History:  Procedure Laterality Date   ABDOMINAL HYSTERECTOMY     BACK SURGERY     CHOLECYSTECTOMY     COLECTOMY WITH COLOSTOMY CREATION/HARTMANN PROCEDURE N/A 10/13/2023   Procedure: PARTIAL COLECTOMY, WITH COLOSTOMY CREATION, PARTIAL SMALL BOWEL RESECTION, ILEOCECECTOMY;  Surgeon: [name], MD;  Location: [facility];  Service: General;  Laterality: N/A;   TONSILLECTOMY      Prior to Admission medications    Medication Sig Start Date End Date Taking? Authorizing Provider  amiodarone  (PACERONE ) 200 MG tablet Take 1 tablet (200 mg total) by mouth daily. 09/29/23 11/28/23 Yes Shah, Pratik D, DO  apixaban  (ELIQUIS ) 5 MG TABS tablet Take 1 tablet (5 mg total) by mouth 2 (two) times daily. 09/28/23  Yes Shah, Pratik D, DO  doxazosin  (CARDURA ) 8 MG tablet Take 8 mg by mouth at bedtime. 12/02/20  Yes [provider]  levothyroxine  (SYNTHROID ) 50 MCG tablet Take 50 mcg by mouth daily. 03/15/17  Yes [provider]  metoprolol  tartrate (LOPRESSOR ) 50 MG tablet Take 1 tablet (50 mg total) by mouth 2 (two) times daily. 09/28/23 11/27/23 Yes Shah, Pratik D, DO  ondansetron  (ZOFRAN ) 4 MG tablet Take 1 tablet (4 mg total) by mouth daily as needed for nausea or vomiting. 09/28/23 09/27/24 Yes Shah, Pratik D, DO  senna-docusate (SENOKOT-S) 8.6-50 MG tablet Take 2 tablets by mouth 2 (two) times daily. 09/28/23 10/28/23 Yes Shah, Pratik D, DO  traMADol  (ULTRAM ) 50 MG tablet Take 1 tablet (50 mg total) by mouth every 12 (twelve) hours as needed. 09/28/23  Yes [name], MD  zolpidem  (AMBIEN ) 10 MG tablet Take 10 mg by mouth at bedtime as needed for sleep. 12/11/20  Yes [provider]  estradiol (ESTRACE) 0.1 MG/GM vaginal cream Place 0.5 g vaginally as needed (vaginal dryness/irritation). Twice a week PRN 11/06/12   [provider]  furosemide  (LASIX ) 20 MG tablet Take 1 tablet every day by oral route for 30 days. Patient not taking: Reported on 10/07/2023 09/30/23   [provider]    Current Facility-Administered Medications  Medication Dose Route Frequency Provider Last  Rate Last Admin   0.9 %  sodium chloride  infusion (Manually program via Guardrails IV Fluids)   Intravenous Once [name], MD       acetaminophen  (TYLENOL ) tablet 1,000 mg  1,000 mg Oral Q6H Pappayliou, Catherine A, DO   1,000 mg at 10/18/23 1651   amiodarone  (NEXTERONE  PREMIX) 360-4.14 MG/200ML-% (1.8 mg/mL) IV  infusion  30 mg/hr Intravenous Continuous [name], MD 16.67 mL/hr at 10/19/23 0451 30 mg/hr at 10/19/23 0451   Chlorhexidine  Gluconate Cloth 2 % PADS 6 each  6 each Topical Daily [name], MD   6 each at 10/19/23 1004   feeding supplement (BOOST / RESOURCE BREEZE) liquid 1 Container  1 Container Oral BID BM Mason Sole, Pratik D, DO   1 Container at 10/18/23 1634   HYDROmorphone  (DILAUDID ) injection 1 mg  1 mg Intravenous Q3H PRN [name], MD   1 mg at 10/19/23 0210   insulin  aspart (novoLOG ) injection 0-15 Units  0-15 Units Subcutaneous Q8H Johnson, Clanford L, MD       levothyroxine  (SYNTHROID ) tablet 50 mcg  50 mcg Oral Daily [name], MD   50 mcg at 10/19/23 1308   metoprolol  tartrate (LOPRESSOR ) injection 5 mg  5 mg Intravenous Q6H Shah, Pratik D, DO   5 mg at 10/19/23 0528   ondansetron  (ZOFRAN ) tablet 4 mg  4 mg Oral Q6H PRN [name], MD       Or   ondansetron  (ZOFRAN ) injection 4 mg  4 mg Intravenous Q6H PRN [name], MD   4 mg at 10/19/23 1003   oxyCODONE  (Oxy IR/ROXICODONE ) immediate release tablet 5 mg  5 mg Oral Q4H PRN Pappayliou, Catherine A, DO   5 mg at 10/16/23 0601   pantoprazole  (PROTONIX ) injection 40 mg  40 mg Intravenous Q12H Adefeso, Oladapo, DO   40 mg at 10/19/23 6578   piperacillin -tazobactam (ZOSYN ) IVPB 3.375 g  3.375 g Intravenous Q8H Shah, Pratik D, DO 12.5 mL/hr at 10/19/23 0529 3.375 g at 10/19/23 4696   prochlorperazine  (COMPAZINE ) injection 10 mg  10 mg Intravenous Q6H PRN [name], MD   10 mg at 10/18/23 2338   sodium chloride  flush (NS) 0.9 % injection 10-40 mL  10-40 mL Intracatheter Q12H [name], MD   10 mL at 10/19/23 1004   sodium chloride  flush (NS) 0.9 % injection 10-40 mL  10-40 mL Intracatheter PRN [name], MD       sodium phosphate  20 mmol in sodium chloride  0.9 % 250 mL infusion  20 mmol Intravenous Once Johnson, Clanford L, MD       TPN ADULT (ION)   Intravenous Continuous TPN Doreene Gammon D, DO 75 mL/hr at  10/19/23 0451 Infusion Verify at 10/19/23 0451   TPN ADULT (ION)   Intravenous Continuous TPN Faustino Hook L, MD        Allergies as of 10/07/2023 - Review Complete 10/07/2023  Allergen Reaction Noted   Amlodipine Swelling 06/04/2020   Clonidine Rash 06/04/2020    Family History  Problem Relation Age of Onset   Colon cancer Neg Hx    Colon polyps Neg Hx     Social History   Socioeconomic History   Marital status: Married    Spouse name: Not on file   Number of children: Not on file   Years of education: Not on file   Highest education level: Not on file  Occupational History   Not on file  Tobacco Use   Smoking status: Never  Smokeless tobacco: Never  Vaping Use   Vaping status: Never Used  Substance and Sexual Activity   Alcohol use: Never   Drug use: Never   Sexual activity: Not on file  Other Topics Concern   Not on file  Social History Narrative   Not on file   Social Drivers of Health   Financial Resource Strain: Not on file  Food Insecurity: No Food Insecurity (10/08/2023)   Hunger Vital Sign    Worried About Running Out of Food in the Last Year: Never true    Ran Out of Food in the Last Year: Never true  Transportation Needs: No Transportation Needs (10/08/2023)   PRAPARE - Administrator, Civil Service (Medical): No    Lack of Transportation (Non-Medical): No  Physical Activity: Not on file  Stress: Not on file  Social Connections: Socially Integrated (10/08/2023)   Social Connection and Isolation Panel [NHANES]    Frequency of Communication with Friends and Family: More than three times a week    Frequency of Social Gatherings with Friends and Family: More than three times a week    Attends Religious Services: More than 4 times per year    Active Member of [employer] or Organizations: Yes    Attends Engineer, structural: More than 4 times per year    Marital Status: Married  Catering manager Violence: Not At Risk (10/08/2023)    Humiliation, Afraid, Rape, and Kick questionnaire    Fear of Current or Ex-Partner: No    Emotionally Abused: No    Physically Abused: No    Sexually Abused: No     Review of Systems   Gen: Denies any fever, chills, loss of appetite, change in weight or weight loss CV: Denies chest pain, heart palpitations, syncope, edema  Resp: Denies shortness of breath with rest, cough, wheezing, coughing up blood, and pleurisy. GI: Denies vomiting blood, jaundice, and fecal incontinence.   Denies dysphagia or odynophagia. GU : Denies urinary burning, blood in urine, urinary frequency, and urinary incontinence. MS: Denies joint pain, limitation of movement, swelling, cramps, and atrophy.  Derm: Denies rash, itching, dry skin, hives. Psych: Denies depression, anxiety, memory loss, hallucinations, and confusion. Heme: Denies bruising or bleeding Neuro:  Denies any headaches, dizziness, paresthesias, shaking  Physical Exam   Vital Signs in last 24 hours: Temp:  [97.9 F (36.6 C)-99.1 F (37.3 C)] 99.1 F (37.3 C) (05/21 0730) Pulse Rate:  [75-148] 96 (05/21 0700) Resp:  [11-30] 20 (05/21 0700) BP: (53-145)/(25-105) 99/56 (05/21 0700) SpO2:  [96 %-100 %] 98 % (05/21 0700) Weight:  [85.2 kg] 85.2 kg (05/21 0430) Last BM Date : 10/16/23  General:   Alert,  Well-developed, well-nourished, pleasant and cooperative in NAD Head:  Normocephalic and atraumatic. Eyes:  Sclera clear, no icterus.   Conjunctiva pink. Lungs:  Clear throughout to auscultation.   No wheezes, crackles, or rhonchi. No acute distress. Heart:  Regular rate and rhythm; no murmurs, clicks, rubs,  or gallops. Abdomen:  Soft, nontender and nondistended.midline incision with honeycomb dressing in place, ostomy to left abdomen with melanotic stool in bag, JP darin to right side of abdomen, some serosanguineous fluid Neurologic:  Alert and  oriented x4. Skin:  Intact without significant lesions or rashes. Psych:  Alert and  cooperative. Normal mood and affect.  Intake/Output from previous day: 05/20 0701 - 05/21 0700 In: 1516.9 [I.V.:1380.8; IV Piggyback:136.1] Out: 1610 [Urine:1000; Drains:10; Stool:600] Intake/Output this shift: Total I/O In: -  Out:  500 [Urine:500]   Labs/Studies   Recent Labs Recent Labs    10/18/23 0848 10/18/23 0951 10/19/23 0134  WBC 7.0 7.9 9.2  HGB 9.1* 10.2* 6.4*  HCT 28.9* 32.0* 20.3*  PLT 118* 127* 146*   BMET Recent Labs    10/17/23 0407 10/18/23 0951 10/19/23 0016  NA 132* 129* 135  K 3.8 4.3 4.7  CL 96* 93* 99  CO2 32 28 25  GLUCOSE 123* 119* 170*  BUN 33* 40* 50*  CREATININE 1.36* 1.21* 1.17*  CALCIUM 8.0* 8.2* 7.7*   LFT Recent Labs    10/17/23 0407  PROT 4.8*  ALBUMIN  2.3*  AST 14*  ALT 11  ALKPHOS 33*  BILITOT 0.5    Radiology/Studies CT ANGIO GI BLEED Result Date: 10/19/2023 CLINICAL DATA:  Lower gastrointestinal hemorrhage EXAM: CTA ABDOMEN AND PELVIS WITHOUT AND WITH CONTRAST TECHNIQUE: Multidetector CT imaging of the abdomen and pelvis was performed using the standard protocol during bolus administration of intravenous contrast. Multiplanar reconstructed images and MIPs were obtained and reviewed to evaluate the vascular anatomy. RADIATION DOSE REDUCTION: This exam was performed according to the departmental dose-optimization program which includes automated exposure control, adjustment of the mA and/or kV according to patient size and/or use of iterative reconstruction technique. CONTRAST:  OMNIPAQUE  IOHEXOL  350 MG/ML SOLN COMPARISON:  None Available. FINDINGS: VASCULAR Aorta: Normal caliber aorta without aneurysm, dissection, vasculitis or significant stenosis. Moderate atherosclerotic calcification. Celiac: Patent without evidence of aneurysm, dissection, vasculitis or significant stenosis. SMA: Patent without evidence of aneurysm, dissection, vasculitis or significant stenosis. Renals: Main renal arteries bilaterally are relatively  diminutive. Right main renal artery is widely patent. 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 Diminutive accessory renal arteries are seen bilaterally. Normal vascular morphology. No aneurysm or dissection. IMA: Patent without evidence of aneurysm, dissection, vasculitis or significant stenosis. Inflow: Patent without evidence of aneurysm, dissection, vasculitis or significant stenosis. Proximal Outflow: Bilateral common femoral and visualized portions of the superficial and profunda femoral arteries are patent without evidence of aneurysm, dissection, vasculitis or significant stenosis. Veins: Unremarkable Review of the MIP images confirms the above findings. NON-VASCULAR Lower chest: Small bilateral pleural effusions. Cardiac size within normal limits. Small hiatal hernia. Hepatobiliary: No focal liver abnormality is seen. Status post cholecystectomy. No biliary dilatation. Pancreas: Unremarkable Spleen: Unremarkable Adrenals/Urinary Tract: Adrenal glands are unremarkable. Simple exophytic cortical cyst is seen within the lower pole the right kidney for which no follow-up imaging is recommended. The kidneys are otherwise unremarkable. Bladder unremarkable. Stomach/Bowel: Partial small bowel and large bowel resection are seen with small bowel anastomosis within the left lower quadrant and enterocolic anastomosis within the right lower quadrant. 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 There is high density intraluminal enteric contents within the small bowel in the region of the small bowel anastomosis compatible with blood product resulting in a partial small bowel obstruction, however, there is no active gastrointestinal hemorrhage identified. Surgical drainage catheter seen within the deep pelvis. No free intraperitoneal gas or fluid. No loculated intra-abdominal fluid collections. Infiltration within  the right lower quadrant small bowel mesentery as well as extraluminal retroperitoneal gas within left lower quadrant is likely postsurgical in nature. Punctate foci of gas within the laparotomy incision within the anterior abdominal wall are nonspecific. Lymphatic: No pathologic adenopathy within the abdomen and pelvis. Reproductive: Status post hysterectomy. No adnexal masses. Other: No abdominal wall hernia Musculoskeletal: Stable subacute appearing superior endplate fracture of L1. No  acute bone abnormality. No lytic or blastic bone lesion. Osseous structures are age appropriate. IMPRESSION: 1. No active gastrointestinal hemorrhage identified. 2. Intraluminal blood product within the small bowel in the region of the small bowel anastomosis resulting in a partial small bowel obstruction. 3. Surgical changes of partial small and large bowel resection with descending colostomy and Hartmann pouch formation. 4. Stable subacute appearing superior endplate fracture of L1. Aortic Atherosclerosis (ICD10-I70.0). Electronically Signed   By: Worthy Heads M.D.   On: 10/19/2023 04:24    Assessment   Felicia Frank is a 78 y.o. year old female with recent hospitalization secondary to diverticulitis with perforation and contained abscess s/p drain placement and enteral fistula formation; who presented to the hospital on 5/9 with ongoing abdominal pain, associated nausea/vomiting. She underwent Hartman's procedure with small bowel resection and ileocecectomy on 5/15. now presenting with large amount of black emesis and black stool in ostomy bag with hgb decline from 10.2 to 6.4. GI consulted for melena, coffee ground emesis  Hematemesis/melena: s/p hartmans procedure with small bowel resection and ileocecetomy on 5/15.  -Onset of black emesis/melena overnight into this morning  - decline in hemoglobin from 10.2 to 6.4. s/p 1 unit PRBCs.  -Repeat hgb pending -CTA without obvious bleeding though with though  intraluminal blood product within small bowel in region of anastomosis resulting in partial SBO.  -still with some melanotic stool in ostomy bag, no further emesis -general surgery has seen patient, not recommending endoscopic evaluations at this time   Plan / Recommendations   -Monitor for ongoing melena/GI bleeding -Trend h&h, transfuse as needed for hgb 7 or less -Conservative measures for SBO/GI bleeding for now, per gen surgery recommendations as well -recommended holding heparin  drip in light of bleeding - continued supportive management of bleeding/SBO for now -consider NG tube placement if recurrent emesis  -on TPN, will follow Gen surg recommendations on diet/ PO status, sips of water for now  GI will follow peripherally for now    10/19/2023, 10:07 AM  Chelsea L. Carlan, MSN, APRN, AGNP-C Adult-Gerontology Nurse Practitioner [facility]

## 2023-10-19 NOTE — Progress Notes (Signed)
 Critical note  RN called due to patient vomiting large amounts of black vomitus (about 500 mL), unfortunately, gastric occult blood test was not done, the vomitus was same as black fluid in the ostomy bag.  Hemoglobin was noted to drop to 7.3 from 10.2 yesterday, this was repeated and repeated hemoglobin was 6.4. Type and screen was done and 1 unit of PRBC was ordered to be transfused. CT angio bleed was done and showed no active gastrointestinal hemorrhage identified.  Intraluminal blood product within the small bowel in the region of the small bowel anastomosis resulting in a partial small bowel obstruction.  BP (!) 99/56   Pulse 96   Temp 98.2 F (36.8 C) (Oral)   Resp 20   Ht 5\' 4"  (1.626 m)   Wt 85.2 kg   SpO2 98%   BMI 32.24 kg/m   Assessment and plan GI bleed H/H since yesterday trended 10.2 > 6.4 Type and crossmatch will be done 1 unit of PRBC will be transfused Continue IV Protonix  40 twice daily Gastroenterologist  consult was placed  Critical time: 33 minutes   Critical care personally provided  managing the patient due to high probability of clinically significant and life threatening deterioration. This critical care time included obtaining a history; examining the patient, pulse oximetry; ordering and review of studies; arranging urgent treatment with development of a management plan; evaluation of patient's response of treatment; frequent reassessment; and discussions with other providers.  This critical care time was performed to assess and manage the high probability of imminent and life threatening deterioration that could result in multi-organ failure.  Please refer to admission H&P and progress notes for details regarding the care of this patient.

## 2023-10-19 NOTE — Progress Notes (Signed)
 [facility] Progress Note  6 Days Post-Op  Subjective: Patient seen and examined.  She is resting comfortably in bed.  Overnight she had nausea with an episode of emesis, which appeared like dark old blood.  Her hemoglobin was noted to have dropped to 6.4, so 1 unit of PRBCs was given.  She has been drinking water today and has had a little bit of nausea without any significant episodes of emesis this morning.  She continues to have colostomy output, though the output is more melanotic today.  Her abdominal pain is well-controlled.  Objective: Vital signs in last 24 hours: Temp:  [97.9 F (36.6 C)-99.1 F (37.3 C)] 99.1 F (37.3 C) (05/21 0730) Pulse Rate:  [75-148] 94 (05/21 1000) Resp:  [11-30] 15 (05/21 1000) BP: (53-145)/(25-105) 105/55 (05/21 1000) SpO2:  [96 %-100 %] 98 % (05/21 1000) Weight:  [85.2 kg] 85.2 kg (05/21 0430) Last BM Date : 10/16/23  Intake/Output from previous day: 05/20 0701 - 05/21 0700 In: 1516.9 [I.V.:1380.8; IV Piggyback:136.1] Out: 1610 [Urine:1000; Drains:10; Stool:600] Intake/Output this shift: Total I/O In: 607.5 [Blood:607.5] Out: 500 [Urine:500]  General appearance: alert, cooperative, and no distress GI: Abdomen soft, nondistended, no percussion tenderness, nontender to palpation; no rigidity, guarding, or rebound tenderness; midline incision C/D/I with honeycomb dressing in place, left-sided ostomy hyperemic and patent with melanotic stool in the bag, JP drain in the right side of the abdomen is serosanguineous with 10 cc of output in the last 24 hours  Lab Results:  Recent Labs    10/18/23 0951 10/19/23 0134  WBC 7.9 9.2  HGB 10.2* 6.4*  HCT 32.0* 20.3*  PLT 127* 146*   BMET Recent Labs    10/18/23 0951 10/19/23 0016  NA 129* 135  K 4.3 4.7  CL 93* 99  CO2 28 25  GLUCOSE 119* 170*  BUN 40* 50*  CREATININE 1.21* 1.17*  CALCIUM 8.2* 7.7*   PT/INR No results for input(s): "LABPROT", "INR" in the last 72  hours.  Studies/Results: CT ANGIO GI BLEED Result Date: 10/19/2023 CLINICAL DATA:  Lower gastrointestinal hemorrhage EXAM: CTA ABDOMEN AND PELVIS WITHOUT AND WITH CONTRAST TECHNIQUE: Multidetector CT imaging of the abdomen and pelvis was performed using the standard protocol during bolus administration of intravenous contrast. Multiplanar reconstructed images and MIPs were obtained and reviewed to evaluate the vascular anatomy. RADIATION DOSE REDUCTION: This exam was performed according to the departmental dose-optimization program which includes automated exposure control, adjustment of the mA and/or kV according to patient size and/or use of iterative reconstruction technique. CONTRAST:  OMNIPAQUE  IOHEXOL  350 MG/ML SOLN COMPARISON:  None Available. FINDINGS: VASCULAR Aorta: Normal caliber aorta without aneurysm, dissection, vasculitis or significant stenosis. Moderate atherosclerotic calcification. Celiac: Patent without evidence of aneurysm, dissection, vasculitis or significant stenosis. SMA: Patent without evidence of aneurysm, dissection, vasculitis or significant stenosis. Renals: Main renal arteries bilaterally are relatively diminutive. Right main renal artery is widely patent. 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 Diminutive accessory renal arteries are seen bilaterally. Normal vascular morphology. No aneurysm or dissection. IMA: Patent without evidence of aneurysm, dissection, vasculitis or significant stenosis. Inflow: Patent without evidence of aneurysm, dissection, vasculitis or significant stenosis. Proximal Outflow: Bilateral common femoral and visualized portions of the superficial and profunda femoral arteries are patent without evidence of aneurysm, dissection, vasculitis or significant stenosis. Veins: Unremarkable Review of the MIP images confirms the above findings. NON-VASCULAR Lower chest: Small bilateral pleural  effusions. Cardiac size within normal  limits. Small hiatal hernia. Hepatobiliary: No focal liver abnormality is seen. Status post cholecystectomy. No biliary dilatation. Pancreas: Unremarkable Spleen: Unremarkable Adrenals/Urinary Tract: Adrenal glands are unremarkable. Simple exophytic cortical cyst is seen within the lower pole the right kidney for which no follow-up imaging is recommended. The kidneys are otherwise unremarkable. Bladder unremarkable. Stomach/Bowel: Partial small bowel and large bowel resection are seen with small bowel anastomosis within the left lower quadrant and enterocolic anastomosis within the right lower quadrant. 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 There is high density intraluminal enteric contents within the small bowel in the region of the small bowel anastomosis compatible with blood product resulting in a partial small bowel obstruction, however, there is no active gastrointestinal hemorrhage identified. Surgical drainage catheter seen within the deep pelvis. No free intraperitoneal gas or fluid. No loculated intra-abdominal fluid collections. Infiltration within the right lower quadrant small bowel mesentery as well as extraluminal retroperitoneal gas within left lower quadrant is likely postsurgical in nature. Punctate foci of gas within the laparotomy incision within the anterior abdominal wall are nonspecific. Lymphatic: No pathologic adenopathy within the abdomen and pelvis. Reproductive: Status post hysterectomy. No adnexal masses. Other: No abdominal wall hernia Musculoskeletal: Stable subacute appearing superior endplate fracture of L1. No acute bone abnormality. No lytic or blastic bone lesion. Osseous structures are age appropriate. IMPRESSION: 1. No active gastrointestinal hemorrhage identified. 2. Intraluminal blood product within the small bowel in the region of the small bowel anastomosis resulting in a partial small  bowel obstruction. 3. Surgical changes of partial small and large bowel resection with descending colostomy and Hartmann pouch formation. 4. Stable subacute appearing superior endplate fracture of L1. Aortic Atherosclerosis (ICD10-I70.0). Electronically Signed   By: Worthy Heads M.D.   On: 10/19/2023 04:24    Anti-infectives: Anti-infectives (From admission, onward)    Start     Dose/Rate Route Frequency Ordered Stop   10/13/23 1200  cefoTEtan  (CEFOTAN ) 2 g in sodium chloride  0.9 % 100 mL IVPB        2 g 200 mL/hr over 30 Minutes Intravenous On call to O.R. 10/12/23 0836 10/13/23 1401   10/13/23 1128  sodium chloride  0.9 % with cefoTEtan  (CEFOTAN ) ADS Med       Note to Pharmacy: Gabino Joe S: cabinet override      10/13/23 1128 10/13/23 1357   10/12/23 1300  metroNIDAZOLE  (FLAGYL ) tablet 500 mg        500 mg Oral 3 times daily 10/11/23 1212 10/12/23 2116   10/12/23 1300  neomycin  (MYCIFRADIN ) tablet 1,000 mg        1,000 mg Oral 3 times daily 10/11/23 1212 10/12/23 2136   10/11/23 1300  neomycin  (MYCIFRADIN ) tablet 1,000 mg  Status:  Discontinued        1,000 mg Oral 3 times daily 10/11/23 0839 10/11/23 1212   10/11/23 1300  metroNIDAZOLE  (FLAGYL ) tablet 500 mg  Status:  Discontinued        500 mg Oral 3 times daily 10/11/23 0839 10/11/23 1212   10/07/23 1500  piperacillin -tazobactam (ZOSYN ) IVPB 3.375 g        3.375 g 12.5 mL/hr over 240 Minutes Intravenous Every 8 hours 10/07/23 1356 10/19/23 1400       Assessment/Plan:  Patient is a 78 year old female who was 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  She is status post Hartman's procedure with small bowel resection and ileocecectomy on 5/16.   - Continue IV Zosyn  for  5 days postoperatively - Gram stain from surgery with no organisms noted.  Culture with rare Candida albicans, await final results - Patient with episode of emesis of dark old blood overnight.  Hemoglobin  dropped from 10.2 the day prior to 7.3/6.4 - 1 unit PRBCs was given - CT angio to evaluate for GI bleed was performed.  There was no evidence of active GI bleed, but there was evidence of likely intraluminal blood product within the small bowel at the region of the small bowel anastomosis, resulting in a partial small bowel obstruction - Hold heparin  drip given episode of bleeding after it was initiated - Monitor H&H - Recommend supportively managing GI bleed/partial small bowel obstruction - Appreciate GI recommendations, though would hold off on scope at this time - Okay for sips of water.  Discussed with the patient if she continues to have nausea and episodes of emesis, NG tube will need to be reinserted - Continue TPN until patient tolerating solid food - Monitor bowel function - Appreciate ostomy nurse recommendations - JP drain care - Appreciate hospitalist and consultants recommendations   LOS: 12 days    Raine Elsass A Chastity Noland 10/19/2023  Note: Portions of this report may have been transcribed using voice recognition software. Every effort has been made to ensure accuracy; however, inadvertent computerized transcription errors may still be present.

## 2023-10-19 NOTE — Progress Notes (Signed)
 PHARMACY - TOTAL PARENTERAL NUTRITION CONSULT NOTE   Indication:  S/p partial colectomy, ileocecectomy, partial resection.  Patient Measurements: Height: 5\' 4"  (162.6 cm) Weight: 85.2 kg (187 lb 13.3 oz) IBW/kg (Calculated) : 54.7 TPN AdjBW (KG): 62.4 Body mass index is 32.24 kg/m. Usual Weight: Unknown  Assessment: 78 year old female hx Afib, CKD, obesity admitted with perforated sigmoid diverticulitis now POD 2 partial colectomy with end colostomy, ileocecectomy, partial small bowel resection. At risk for refeeding d/t minimal intake since admission.  Glucose / Insulin : No prior Hx DM, CBGs 131-171. 7 units given in past 24 hours  Electrolytes: , K 4.3 > 4.7  mag 1.6,  phos 1.6  Renal: Hx CKD, Scr 1.36 Hepatic: WNL  GI Surgeries / Procedures:  5/15 partial colectomy/small bowel resection  Central access: Double lumen PICC since 5/11 TPN start date: 5/16  Nutritional Goals: Goal TPN rate is 75 ml/hr over 24 hours (provides 90 g of protein and 1800 kcals per day)   RD Assessment: Estimated Needs Total Energy Estimated Needs: 1700-1900 Total Protein Estimated Needs: 90-110 gm Total Fluid Estimated Needs: 1.7-1.9 L  Current Nutrition:  NPO. 100% nutritional needs from TPN  Plan:  Magnesium  sulfate 1 gram x 1  Sodium phosphate  20 mmol x 1   Continue TPN to goal 75 mL/hr at 1800 Electrolytes in TPN: Na 75 mEq/L, K 38mEq/L, Ca 71mEq/L, Mg 8 mEq/L, and Phos 53mmol/L.  Cl:Ac 2:1 Add standard MVI and trace elements to TPN Add Thiamine 100mg  to TPN Continue Moderate q8h SSI and adjust as needed  Monitor TPN labs on Mon/Thurs  Cliffton Dama, PharmD Clinical Pharmacist 10/19/2023 8:33 AM

## 2023-10-19 NOTE — Plan of Care (Signed)

## 2023-10-19 NOTE — Progress Notes (Signed)
 Patient hasn't  voided by herself since the foley was out yesterday, have been doing straight cath each shift, recent Bladder scan shows 605 ml, patient says she is full but not able to void by herself,  MD Arrien consulted, new order placed for foley catheter. Foley catheter placed at 2230, patient feels relieved. Will continue to monitor.

## 2023-10-19 NOTE — Progress Notes (Signed)
 Patient has been nauseous most of the time, started vomiting dark brown/black emesis, similar to the color of colostomy output, MD made aware, new orders obtained, concerned for internal bleeding, communicated with MD Adefeso.

## 2023-10-19 NOTE — Progress Notes (Signed)
 Critical result Documentation  10/19/23 0224  Provider Notification  Provider Name/Title (S)  Dr. Elyse Hand  Date Provider Notified (S)  10/19/23  Time Provider Notified (S)  0224  Method of Notification (S)  Page  Notification Reason (S)  Critical Result  Test performed and critical result (S)  Hemoglobin repeat 6.4  Date Critical Result Received (S)  10/19/23  Time Critical Result Received (S)  0222  Provider response (S)  Other (Comment) (awaiting)

## 2023-10-19 NOTE — Progress Notes (Signed)
 PHARMACY - ANTICOAGULATION CONSULT NOTE  Pharmacy Consult for heparin  Indication: atrial fibrillation  Labs: Recent Labs    10/16/23 0620 10/17/23 0407 10/17/23 2158 10/18/23 0848 10/18/23 0951 10/18/23 1625 10/19/23 0016  HGB 10.1* 10.9*  --  9.1* 10.2*  --   --   HCT 29.0* 32.4*  --  28.9* 32.0*  --   --   PLT 128* 131*  --  118* 127*  --   --   HEPARINUNFRC  --   --    < > 0.14*  --  0.49 0.60  CREATININE 1.56* 1.36*  --   --  1.21*  --   --    < > = values in this interval not displayed.   Assessment/Plan:  78yo female remains therapeutic on heparin . Will continue infusion at current rate of 950 units/hr and monitor daily level.  Lonnie Roberts, PharmD, BCPS 10/19/2023 1:09 AM

## 2023-10-20 DIAGNOSIS — N179 Acute kidney failure, unspecified: Secondary | ICD-10-CM | POA: Diagnosis not present

## 2023-10-20 DIAGNOSIS — K5720 Diverticulitis of large intestine with perforation and abscess without bleeding: Secondary | ICD-10-CM | POA: Diagnosis not present

## 2023-10-20 DIAGNOSIS — I483 Typical atrial flutter: Secondary | ICD-10-CM

## 2023-10-20 LAB — COMPREHENSIVE METABOLIC PANEL WITH GFR
ALT: 22 U/L (ref 0–44)
AST: 23 U/L (ref 15–41)
Albumin: 1.6 g/dL — ABNORMAL LOW (ref 3.5–5.0)
Alkaline Phosphatase: 35 U/L — ABNORMAL LOW (ref 38–126)
Anion gap: 6 (ref 5–15)
BUN: 55 mg/dL — ABNORMAL HIGH (ref 8–23)
CO2: 22 mmol/L (ref 22–32)
Calcium: 7.3 mg/dL — ABNORMAL LOW (ref 8.9–10.3)
Chloride: 104 mmol/L (ref 98–111)
Creatinine, Ser: 1.1 mg/dL — ABNORMAL HIGH (ref 0.44–1.00)
GFR, Estimated: 51 mL/min — ABNORMAL LOW (ref >60)
Glucose, Bld: 138 mg/dL — ABNORMAL HIGH (ref 70–99)
Potassium: 4.5 mmol/L (ref 3.5–5.1)
Sodium: 132 mmol/L — ABNORMAL LOW (ref 135–145)
Total Bilirubin: 0.5 mg/dL (ref 0.0–1.2)
Total Protein: 3.5 g/dL — ABNORMAL LOW (ref 6.5–8.1)

## 2023-10-20 LAB — GLUCOSE, CAPILLARY
Glucose-Capillary: 185 mg/dL — ABNORMAL HIGH (ref 70–99)
Glucose-Capillary: 164 mg/dL — ABNORMAL HIGH (ref 70–99)
Glucose-Capillary: 190 mg/dL — ABNORMAL HIGH (ref 70–99)
Glucose-Capillary: 213 mg/dL — ABNORMAL HIGH (ref 70–99)

## 2023-10-20 LAB — CBC
HCT: 18.9 % — ABNORMAL LOW (ref 36.0–46.0)
Hemoglobin: 6.6 g/dL — CL (ref 12.0–15.0)
MCH: 32.2 pg (ref 26.0–34.0)
MCHC: 34.9 g/dL (ref 30.0–36.0)
MCV: 92.2 fL (ref 80.0–100.0)
Platelets: 125 10*3/uL — ABNORMAL LOW (ref 150–400)
RBC: 2.05 MIL/uL — ABNORMAL LOW (ref 3.87–5.11)
RDW: 15.4 % (ref 11.5–15.5)
WBC: 11.4 10*3/uL — ABNORMAL HIGH (ref 4.0–10.5)
nRBC: 0.3 % — ABNORMAL HIGH (ref 0.0–0.2)

## 2023-10-20 LAB — PHOSPHORUS: Phosphorus: 3.4 mg/dL (ref 2.5–4.6)

## 2023-10-20 LAB — HEMOGLOBIN AND HEMATOCRIT, BLOOD
HCT: 20.6 % — ABNORMAL LOW (ref 36.0–46.0)
Hemoglobin: 7.5 g/dL — ABNORMAL LOW (ref 12.0–15.0)

## 2023-10-20 LAB — MAGNESIUM: Magnesium: 1.9 mg/dL (ref 1.7–2.4)

## 2023-10-20 LAB — PREPARE RBC (CROSSMATCH)

## 2023-10-20 MED ORDER — SODIUM CHLORIDE 0.9% IV SOLUTION: Freq: Once | INTRAVENOUS | Status: AC

## 2023-10-20 MED ORDER — TRAVASOL 10 % IV SOLN
INTRAVENOUS | Status: AC
  Filled 2023-10-20: qty 900

## 2023-10-20 NOTE — Progress Notes (Signed)
 PT Cancellation Note  Patient Details Name: Sheralyn Pinegar MRN: [id number] DOB: [birth year]   Cancelled Treatment:    Reason Eval/Treat Not Completed: Patient not medically ready Spoke to nurse who reports low hemoglobin and pt lethargic today.  RN plans to give blood.  Held PT session.  Minor Amble, LPTA/CLT; CBIS [phone]  [name] 10/20/2023, 11:31 AM

## 2023-10-20 NOTE — Progress Notes (Signed)
 Patient Name: Felicia Frank Date of Encounter: 10/20/2023 [facility] Cardiologist: None   Interval Summary  .    Patient is in and out of afib on tele. She does have palpitations when she's in Afib. Hgb 5.9>6.6 after transfusion. She still feels weak. No chest pain or SOB. She is full liquid diet.   Vital Signs .    Vitals:   10/20/23 0500 10/20/23 0501 10/20/23 0600 10/20/23 0801  BP:  98/72 99/65 114/70  Pulse:  (!) 135 (!) 134 87  Resp:  16 18 14   Temp:    98.8 F (37.1 C)  TempSrc:    Axillary  SpO2:  96% 99% 99%  Weight: 85.8 kg     Height:        Intake/Output Summary (Last 24 hours) at 10/20/2023 0829 Last data filed at 10/20/2023 0981 Gross per 24 hour  Intake 3621.09 ml  Output 2120 ml  Net 1501.09 ml      10/20/2023    5:00 AM 10/19/2023    4:30 AM 10/18/2023    5:00 AM  Last 3 Weights  Weight (lbs) 189 lb 2.5 oz 187 lb 13.3 oz 187 lb 9.8 oz  Weight (kg) 85.8 kg 85.2 kg 85.1 kg      Telemetry/ECG    PAroxysmal Afib, NSR in the 80s, afib rates up to 130s- Personally Reviewed  Physical Exam .   GEN: No acute distress.   Neck: No JVD Cardiac: RRR, no murmurs, rubs, or gallops.  Respiratory: Clear to auscultation bilaterally. GI: Soft, nontender, non-distended  MS: No edema   Cardiac Studies    08/2023 echo 1. Left ventricular ejection fraction, by estimation, is 65 to 70%. The  left ventricle has normal function. The left ventricle has no regional  wall motion abnormalities. Left ventricular diastolic parameters are  indeterminate.   2. Right ventricular systolic function is normal. The right ventricular  size is normal. Tricuspid regurgitation signal is inadequate for assessing  PA pressure.   3. The mitral valve is normal in structure. No evidence of mitral valve  regurgitation. No evidence of mitral stenosis.   4. The aortic valve is tricuspid. Aortic valve regurgitation is not  visualized. No aortic stenosis is present.     Patient Profile     Felicia Frank is a 78 y.o. female with a hx of hypothyroidism, primary hypertension, chronic kidney disease stage IIIa, class I obesity, paroxysmal atrial fibrillation with recent hospitalization secondary to diverticulitis with perforation and contained abscess status post drain placement and internal fistula formation,  who is being seen 10/17/2023 for the evaluation of atrial fibrillation with RVR at the request of Dr Mason Sole.     Assessment & Plan .    PAF - Afib diagnosed during 08/2023 admission with perforated diverticulitis, managed by medicine team, discharged on amiodarone  200mg  daily, lopressor  50mg  BID. DOAC not started in case surgery was needed - recurrent Afib RVR this admission in the setting of ongoing bowel perforation. Surgery was on 5/15 and remains on abx - has been NPO on IV amiodarone  and IV lopressor  5mg  Q6H - she is in and out of Afib on tele - IV heparin  stopped for drp in Hgb 10.9>5.9 - continue IV amiodarone  as she is not PO. she is on amio 54mL/hr  LE edema - Echo showed LVEF 65-70%, normal RV Function - BNP 80s - hold on diuresis - lower leg edema may be due to third spacing due to albumin  1.6  For questions or updates, please contact [facility] Please consult [email or web address] for contact info under        Signed, Zahid Carneiro Rebekah Canada, PA-C

## 2023-10-20 NOTE — Progress Notes (Signed)
 PHARMACY - TOTAL PARENTERAL NUTRITION CONSULT NOTE   Indication:  S/p partial colectomy, ileocecectomy, partial resection.  Patient Measurements: Height: 5\' 4"  (162.6 cm) Weight: 85.8 kg (189 lb 2.5 oz) IBW/kg (Calculated) : 54.7 TPN AdjBW (KG): 62.4 Body mass index is 32.47 kg/m. Usual Weight: Unknown  Assessment: 78 year old female hx Afib, CKD, obesity admitted with perforated sigmoid diverticulitis now POD 2 partial colectomy with end colostomy, ileocecectomy, partial small bowel resection. At risk for refeeding d/t minimal intake since admission.  Glucose / Insulin : No prior Hx DM, CBGs 107-185. 7 units given in past 24 hours  Electrolytes:   Na 132 phos 1.6 > 3.4  Renal: Hx CKD, Scr 1.1 Hepatic: WNL  GI Surgeries / Procedures:  5/15 partial colectomy/small bowel resection  Central access: Double lumen PICC since 5/11 TPN start date: 5/16  Nutritional Goals: Goal TPN rate is 75 ml/hr over 24 hours (provides 90 g of protein and 1800 kcals per day)   RD Assessment: Estimated Needs Total Energy Estimated Needs: 1700-1900 Total Protein Estimated Needs: 90-110 gm Total Fluid Estimated Needs: 1.7-1.9 L  Current Nutrition:  NPO. 100% nutritional needs from TPN  Plan:   Continue TPN to goal 75 mL/hr  Electrolytes in TPN: Na 90 mEq/L, K 3mEq/L, Ca 31mEq/L, Mg 8 mEq/L, and Phos 81mmol/L.  Cl:Ac 2:1 Add standard MVI and trace elements to TPN Add Thiamine 100mg  to TPN Continue Moderate q8h SSI and adjust as needed  Monitor TPN labs on Mon/Thurs  Cliffton Dama, PharmD Clinical Pharmacist 10/20/2023 8:30 AM

## 2023-10-20 NOTE — Plan of Care (Signed)
  Problem: Education: Goal: Knowledge of General Education information will improve Description: Including pain rating scale, medication(s)/side effects and non-pharmacologic comfort measures Outcome: Progressing   Problem: Health Behavior/Discharge Planning: Goal: Ability to manage health-related needs will improve Outcome: Progressing   Problem: Clinical Measurements: Goal: Will remain free from infection Outcome: Progressing Goal: Respiratory complications will improve Outcome: Progressing Goal: Cardiovascular complication will be avoided Outcome: Progressing   Problem: Activity: Goal: Risk for activity intolerance will decrease Outcome: Progressing   Problem: Nutrition: Goal: Adequate nutrition will be maintained Outcome: Progressing   Problem: Coping: Goal: Level of anxiety will decrease Outcome: Progressing   Problem: Elimination: Goal: Will not experience complications related to urinary retention Outcome: Progressing   Problem: Pain Managment: Goal: General experience of comfort will improve and/or be controlled Outcome: Progressing

## 2023-10-20 NOTE — Progress Notes (Signed)
 7 Days Post-Op  Subjective: Events of the last 24 hours noted.  Patient is fatigued.  No emesis noted.  No significant abdominal pain at the present time.  Objective: Vital signs in last 24 hours: Temp:  [97.4 F (36.3 C)-99.1 F (37.3 C)] 98 F (36.7 C) (05/22 0400) Pulse Rate:  [56-150] 134 (05/22 0600) Resp:  [12-29] 18 (05/22 0600) BP: (80-143)/(34-84) 99/65 (05/22 0600) SpO2:  [96 %-100 %] 99 % (05/22 0600) Weight:  [85.8 kg] 85.8 kg (05/22 0500) Last BM Date : 10/16/23  Intake/Output from previous day: 05/21 0701 - 05/22 0700 In: 3296.1 [I.V.:2151.3; Blood:887.5; IV Piggyback:257.3] Out: 2120 [Urine:1300; Drains:20; Stool:800] Intake/Output this shift: No intake/output data recorded.  General appearance: alert, cooperative, and fatigued GI: Soft with active bowel sounds appreciated.  Ileostomy pink and patent.  Melanotic stools noted within ostomy bag along with gas. Incision healing well.  JP drainage minimal and serous in nature.  Lab Results:  Recent Labs    10/19/23 1140 10/19/23 1458 10/19/23 2146 10/20/23 0425  WBC 10.4  --   --  11.4*  HGB 7.4*   < > 5.9* 6.6*  HCT 21.4*   < > 17.0* 18.9*  PLT 127*  --   --  125*   < > = values in this interval not displayed.   BMET Recent Labs    10/19/23 0016 10/20/23 0425  NA 135 132*  K 4.7 4.5  CL 99 104  CO2 25 22  GLUCOSE 170* 138*  BUN 50* 55*  CREATININE 1.17* 1.10*  CALCIUM 7.7* 7.3*   PT/INR No results for input(s): "LABPROT", "INR" in the last 72 hours.  Studies/Results: CT ANGIO GI BLEED Result Date: 10/19/2023 CLINICAL DATA:  Lower gastrointestinal hemorrhage EXAM: CTA ABDOMEN AND PELVIS WITHOUT AND WITH CONTRAST TECHNIQUE: Multidetector CT imaging of the abdomen and pelvis was performed using the standard protocol during bolus administration of intravenous contrast. Multiplanar reconstructed images and MIPs were obtained and reviewed to evaluate the vascular anatomy. RADIATION DOSE REDUCTION: This  exam was performed according to the departmental dose-optimization program which includes automated exposure control, adjustment of the mA and/or kV according to patient size and/or use of iterative reconstruction technique. CONTRAST:  OMNIPAQUE  IOHEXOL  350 MG/ML SOLN COMPARISON:  None Available. FINDINGS: VASCULAR Aorta: Normal caliber aorta without aneurysm, dissection, vasculitis or significant stenosis. Moderate atherosclerotic calcification. Celiac: Patent without evidence of aneurysm, dissection, vasculitis or significant stenosis. SMA: Patent without evidence of aneurysm, dissection, vasculitis or significant stenosis. Renals: Main renal arteries bilaterally are relatively diminutive. Right main renal artery is widely patent. 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 Diminutive accessory renal arteries are seen bilaterally. Normal vascular morphology. No aneurysm or dissection. IMA: Patent without evidence of aneurysm, dissection, vasculitis or significant stenosis. Inflow: Patent without evidence of aneurysm, dissection, vasculitis or significant stenosis. Proximal Outflow: Bilateral common femoral and visualized portions of the superficial and profunda femoral arteries are patent without evidence of aneurysm, dissection, vasculitis or significant stenosis. Veins: Unremarkable Review of the MIP images confirms the above findings. NON-VASCULAR Lower chest: Small bilateral pleural effusions. Cardiac size within normal limits. Small hiatal hernia. Hepatobiliary: No focal liver abnormality is seen. Status post cholecystectomy. No biliary dilatation. Pancreas: Unremarkable Spleen: Unremarkable Adrenals/Urinary Tract: Adrenal glands are unremarkable. Simple exophytic cortical cyst is seen within the lower pole the right kidney for which no follow-up imaging is recommended. The kidneys are otherwise unremarkable. Bladder unremarkable. Stomach/Bowel:  Partial small bowel and  large bowel resection are seen with small bowel anastomosis within the left lower quadrant and enterocolic anastomosis within the right lower quadrant. 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 There is high density intraluminal enteric contents within the small bowel in the region of the small bowel anastomosis compatible with blood product resulting in a partial small bowel obstruction, however, there is no active gastrointestinal hemorrhage identified. Surgical drainage catheter seen within the deep pelvis. No free intraperitoneal gas or fluid. No loculated intra-abdominal fluid collections. Infiltration within the right lower quadrant small bowel mesentery as well as extraluminal retroperitoneal gas within left lower quadrant is likely postsurgical in nature. Punctate foci of gas within the laparotomy incision within the anterior abdominal wall are nonspecific. Lymphatic: No pathologic adenopathy within the abdomen and pelvis. Reproductive: Status post hysterectomy. No adnexal masses. Other: No abdominal wall hernia Musculoskeletal: Stable subacute appearing superior endplate fracture of L1. No acute bone abnormality. No lytic or blastic bone lesion. Osseous structures are age appropriate. IMPRESSION: 1. No active gastrointestinal hemorrhage identified. 2. Intraluminal blood product within the small bowel in the region of the small bowel anastomosis resulting in a partial small bowel obstruction. 3. Surgical changes of partial small and large bowel resection with descending colostomy and Hartmann pouch formation. 4. Stable subacute appearing superior endplate fracture of L1. Aortic Atherosclerosis (ICD10-I70.0). Electronically Signed   By: Worthy Heads M.D.   On: 10/19/2023 04:24    Anti-infectives: Anti-infectives (From admission, onward)    Start     Dose/Rate Route Frequency Ordered Stop   10/13/23 1200  cefoTEtan  (CEFOTAN ) 2 g  in sodium chloride  0.9 % 100 mL IVPB        2 g 200 mL/hr over 30 Minutes Intravenous On call to O.R. 10/12/23 0836 10/13/23 1401   10/13/23 1128  sodium chloride  0.9 % with cefoTEtan  (CEFOTAN ) ADS Med       Note to Pharmacy: Gabino Joe S: cabinet override      10/13/23 1128 10/13/23 1357   10/12/23 1300  metroNIDAZOLE  (FLAGYL ) tablet 500 mg        500 mg Oral 3 times daily 10/11/23 1212 10/12/23 2116   10/12/23 1300  neomycin  (MYCIFRADIN ) tablet 1,000 mg        1,000 mg Oral 3 times daily 10/11/23 1212 10/12/23 2136   10/11/23 1300  neomycin  (MYCIFRADIN ) tablet 1,000 mg  Status:  Discontinued        1,000 mg Oral 3 times daily 10/11/23 0839 10/11/23 1212   10/11/23 1300  metroNIDAZOLE  (FLAGYL ) tablet 500 mg  Status:  Discontinued        500 mg Oral 3 times daily 10/11/23 0839 10/11/23 1212   10/07/23 1500  piperacillin -tazobactam (ZOSYN ) IVPB 3.375 g        3.375 g 12.5 mL/hr over 240 Minutes Intravenous Every 8 hours 10/07/23 1356 10/19/23 1400       Assessment/Plan: s/p Procedure(s): PARTIAL COLECTOMY, WITH COLOSTOMY CREATION, PARTIAL SMALL BOWEL RESECTION, ILEOCECECTOMY Impression: Postoperative day 7.  Patient started having GI bleed with heparin  drip initiation.  This has been held.  CT angio reassuring that she is not having active bleeding.  Appreciate GI consultation.  Patient has been ordered 2 more units of packed red blood cells given her soft blood pressures.  She is currently in normal sinus rhythm.  Discussed situation with daughter.  Will try full liquid diet as this may settle her stomach down more.  Continue IV Protonix .  LOS: 13 days  [name] 10/20/2023

## 2023-10-20 NOTE — Progress Notes (Signed)
 PROGRESS NOTE   Felicia Frank  WUJ:[id number] DOB: 11/08/1945 DOA: 10/07/2023 PCP: Allana Ishikawa, MD   Chief Complaint  Patient presents with   Abdominal Pain   Level of care: Stepdown  Brief Admission History:  78 y.o. female with medical history significant of hypothyroidism, hypertension, chronic kidney disease stage IIIa, class I obesity, atrial fibrillation and recent hospitalization secondary to diverticulitis with perforation and contained abscess status post drain placement and enteral fistula formation; who presented to the hospital secondary to ongoing abdominal pain, associated nausea/intermittent vomiting and difficulty keeping things down.  Patient has been admitted with diverticulitis of the colon with perforation and enterocolonic fistula.  She is status post Hartman's procedure with small bowel resection and ileocecectomy on 5/15.  Patient remains on TPN and Lasix  and continues to have some elevated heart rates requiring recurrent amiodarone  bolus and ongoing drip as well as IV metoprolol .  Surgery team following and Cardiology consulted for assistance in management as patient remains on IV amiodarone  infusion.      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 - Completed IV antibiotics with Zosyn  for 5 days postoperatively thru 5/21 - Appreciate general surgery recommendations to advance to clear liquid diet and remove NG tube and continue TPN until tolerating solid food. - Started on Lasix  5/16 and -5.1 L fluid balance noted in the last 24 hours with stable creatinine levels with -7.8 L fluid balance since initiation of Lasix , hold further doses for now -Discontinue Foley catheter and monitor voiding output    Chronic paroxysmal atrial fibrillation with RVR-poorly controlled - Will continue the use of adjusted dose IV metoprolol  and SR  amiodarone  drip with loading dose repeated on 5/19 -Consult to cardiology for further assistance in management - IV heparin  stopped on 10/19/23 due to drop in Hg - continue IV amiodarone  infusion, cardiology rebolused on 5/20 and increased rate to 60 ml/hr, rebolused on 5/21, now back down to 30 mg/hr as heart rates have improved.    Acute hematemesis - pt reported vomited large amounts of black vomitus (500 mL) overnight; hg down to 6.4. CT angio GI bleed study negative for GI bleeding.  Intraluminal blood product noted within the small bowel in the region of the small bowel anastomosis resulting in a partial SBO.   - Pt is being treated supportively  - transfused 1 unit PRBC. Repeat CBC pending.   Acute blood loss anemia-Hg declined to 6.4 after restarting IV heparin  infusion - Status post 2 unit PRBC transfusion 5/17  - s/p 1 unit PRBC on 5/21, 3 units PRBC ordered on 5/22 - recheck CBC daily until Hg stabilized   essential hypertension - Continue to monitor    hypothyroidism - Continue Synthroid . - Taking p.o.   acute kidney injury in the setting of chronic kidney disease stage IIIa - Creatinine improving with diuresis - Continue to monitor closely   GERD - Continue PPI   compression fracture of L1 - Continue as needed analgesia - Will recommend continued use of TLSO brace at discharge.   class I obesity -Body mass index is 32.28 kg/m. - Low-calorie diet and portion control discussed with patient.    DVT prophylaxis: IV heparin  was resumed 5/19, stopped on 5/21 due to drop in Hg, SCDs Code Status: Full  Family Communication: bedside update 5/22 Disposition: anticipating SNF ([facility])   Consultants:  Surgery Cardiology  Procedures:   Antimicrobials:  Subjective: Pt somnolent today.    Objective: Vitals:   10/20/23 1115 10/20/23 1132 10/20/23 1133 10/20/23 1149  BP:  (!) 143/100 (!) 143/100 (!) 145/75  Pulse: 83 64 (!) 55 83  Resp: 14 16 16 17   Temp:   98.3 F (36.8 C) 98.3 F (36.8 C) 98.3 F (36.8 C)  TempSrc:  Axillary Axillary Axillary  SpO2: 99% 100% 100% 98%  Weight:      Height:        Intake/Output Summary (Last 24 hours) at 10/20/2023 1459 Last data filed at 10/20/2023 1215 Gross per 24 hour  Intake 3676.85 ml  Output 1620 ml  Net 2056.85 ml   Filed Weights   10/18/23 0500 10/19/23 0430 10/20/23 0500  Weight: 85.1 kg 85.2 kg 85.8 kg   Examination:  General exam: Appears weak pale and somnolent, but NAD, cooperative.   Respiratory system: Clear to auscultation. Respiratory effort normal. Cardiovascular system: irregularly irregular normal S1 & S2 heard. trace pedal edema. Gastrointestinal system: Abdomen is nondistended, soft and nontender. Ostomy with black output seen. Melanotic output from ostomy seen. Central nervous system: Alert and oriented. No focal neurological deficits. Extremities: Symmetric 5 x 5 power. Skin: No rashes, lesions or ulcers. Psychiatry: Judgement and insight appear normal. Mood & affect appropriate.   Data Reviewed: I have personally reviewed following labs and imaging studies  CBC: Recent Labs  Lab 10/18/23 0848 10/18/23 0951 10/19/23 0134 10/19/23 1140 10/19/23 1458 10/19/23 2146 10/20/23 0425  WBC 7.0 7.9 9.2 10.4  --   --  11.4*  HGB 9.1* 10.2* 6.4* 7.4* 7.0* 5.9* 6.6*  HCT 28.9* 32.0* 20.3* 21.4* 20.1* 17.0* 18.9*  MCV 97.3 93.0 94.9 90.3  --   --  92.2  PLT 118* 127* 146* 127*  --   --  125*    Basic Metabolic Panel: Recent Labs  Lab 10/14/23 0443 10/15/23 0456 10/16/23 0620 10/17/23 0407 10/18/23 0951 10/19/23 0016 10/20/23 0425  NA 136   < > 130* 132* 129* 135 132*  K 4.0   < > 3.8 3.8 4.3 4.7 4.5  CL 108   < > 96* 96* 93* 99 104  CO2 22   < > 28 32 28 25 22   GLUCOSE 144*   < > 108* 123* 119* 170* 138*  BUN 16   < > 28* 33* 40* 50* 55*  CREATININE 1.40*   < > 1.56* 1.36* 1.21* 1.17* 1.10*  CALCIUM 7.7*   < > 7.8* 8.0* 8.2* 7.7* 7.3*  MG 1.6*   < > 1.6* 1.9  1.7 1.6* 1.9  PHOS 3.1  --   --  2.4*  --  1.6* 3.4   < > = values in this interval not displayed.    CBG: Recent Labs  Lab 10/19/23 1603 10/19/23 2118 10/20/23 0545 10/20/23 0757 10/20/23 1359  GLUCAP 107* 124* 185* 164* 190*    Recent Results (from the past 240 hours)  Aerobic/Anaerobic Culture w Gram Stain (surgical/deep wound)     Status: None   Collection Time: 10/13/23  3:58 PM   Specimen: Path fluid; GI  Result Value Ref Range Status   Specimen Description ABSCESS  Final   Special Requests INTRAABDOMINAL  Final   Gram Stain   Final    FEW WBC PRESENT, PREDOMINANTLY PMN NO ORGANISMS SEEN    Culture   Final    RARE CANDIDA ALBICANS NO ANAEROBES ISOLATED Performed at [facility], [address] [place], [place] [address]  Report Status 10/18/2023 FINAL  Final     Radiology Studies: CT ANGIO GI BLEED Result Date: 10/19/2023 CLINICAL DATA:  Lower gastrointestinal hemorrhage EXAM: CTA ABDOMEN AND PELVIS WITHOUT AND WITH CONTRAST TECHNIQUE: Multidetector CT imaging of the abdomen and pelvis was performed using the standard protocol during bolus administration of intravenous contrast. Multiplanar reconstructed images and MIPs were obtained and reviewed to evaluate the vascular anatomy. RADIATION DOSE REDUCTION: This exam was performed according to the departmental dose-optimization program which includes automated exposure control, adjustment of the mA and/or kV according to patient size and/or use of iterative reconstruction technique. CONTRAST:  OMNIPAQUE  IOHEXOL  350 MG/ML SOLN COMPARISON:  None Available. FINDINGS: VASCULAR Aorta: Normal caliber aorta without aneurysm, dissection, vasculitis or significant stenosis. Moderate atherosclerotic calcification. Celiac: Patent without evidence of aneurysm, dissection, vasculitis or significant stenosis. SMA: Patent without evidence of aneurysm, dissection, vasculitis or significant stenosis. Renals: Main renal arteries  bilaterally are relatively diminutive. Right main renal artery is widely patent. 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 Diminutive accessory renal arteries are seen bilaterally. Normal vascular morphology. No aneurysm or dissection. IMA: Patent without evidence of aneurysm, dissection, vasculitis or significant stenosis. Inflow: Patent without evidence of aneurysm, dissection, vasculitis or significant stenosis. Proximal Outflow: Bilateral common femoral and visualized portions of the superficial and profunda femoral arteries are patent without evidence of aneurysm, dissection, vasculitis or significant stenosis. Veins: Unremarkable Review of the MIP images confirms the above findings. NON-VASCULAR Lower chest: Small bilateral pleural effusions. Cardiac size within normal limits. Small hiatal hernia. Hepatobiliary: No focal liver abnormality is seen. Status post cholecystectomy. No biliary dilatation. Pancreas: Unremarkable Spleen: Unremarkable Adrenals/Urinary Tract: Adrenal glands are unremarkable. Simple exophytic cortical cyst is seen within the lower pole the right kidney for which no follow-up imaging is recommended. The kidneys are otherwise unremarkable. Bladder unremarkable. Stomach/Bowel: Partial small bowel and large bowel resection are seen with small bowel anastomosis within the left lower quadrant and enterocolic anastomosis within the right lower quadrant. 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 There is high density intraluminal enteric contents within the small bowel in the region of the small bowel anastomosis compatible with blood product resulting in a partial small bowel obstruction, however, there is no active gastrointestinal hemorrhage identified. Surgical drainage catheter seen within the deep pelvis. No free intraperitoneal gas or fluid. No loculated intra-abdominal fluid  collections. Infiltration within the right lower quadrant small bowel mesentery as well as extraluminal retroperitoneal gas within left lower quadrant is likely postsurgical in nature. Punctate foci of gas within the laparotomy incision within the anterior abdominal wall are nonspecific. Lymphatic: No pathologic adenopathy within the abdomen and pelvis. Reproductive: Status post hysterectomy. No adnexal masses. Other: No abdominal wall hernia Musculoskeletal: Stable subacute appearing superior endplate fracture of L1. No acute bone abnormality. No lytic or blastic bone lesion. Osseous structures are age appropriate. IMPRESSION: 1. No active gastrointestinal hemorrhage identified. 2. Intraluminal blood product within the small bowel in the region of the small bowel anastomosis resulting in a partial small bowel obstruction. 3. Surgical changes of partial small and large bowel resection with descending colostomy and Hartmann pouch formation. 4. Stable subacute appearing superior endplate fracture of L1. Aortic Atherosclerosis (ICD10-I70.0). Electronically Signed   By: Worthy Heads M.D.   On: 10/19/2023 04:24    Scheduled Meds:  acetaminophen   1,000 mg Oral Q6H   Chlorhexidine  Gluconate Cloth  6 each Topical Daily   insulin  aspart  0-15 Units Subcutaneous Q8H   levothyroxine   50 mcg Oral Daily   metoprolol  tartrate  5 mg Intravenous Q6H   pantoprazole  (PROTONIX ) IV  40 mg Intravenous Q12H   sodium chloride  flush  10-40 mL Intracatheter Q12H   Continuous Infusions:  amiodarone  30 mg/hr (10/20/23 1005)   TPN ADULT (ION) 75 mL/hr at 10/20/23 0700   TPN ADULT (ION)       LOS: 13 days   Critical Care Procedure Note Authorized and Performed by: Olga Berthold MD  Total Critical Care time:  60 mins Due to a high probability of clinically significant, life threatening deterioration, the patient required my highest level of preparedness to intervene emergently and I personally spent this critical care time  directly and personally managing the patient.  This critical care time included obtaining a history; examining the patient, pulse oximetry; ordering and review of studies; arranging urgent treatment with development of a management plan; evaluation of patient's response of treatment; frequent reassessment; and discussions with other providers.  This critical care time was performed to assess and manage the high probability of imminent and life threatening deterioration that could result in multi-organ failure.  It was exclusive of separately billable procedures and treating other patients and teaching time.    Faustino Hook, MD How to contact the [facility] Attending or Consulting provider 7A - 7P or covering provider during after hours 7P -7A, for this patient?  Check the care team in [facility] and look for a) attending/consulting [facility] provider listed and b) the [facility] team listed Log into [email or web address] to find provider on call.  Locate the [facility] provider you are looking for under [facility] and page to a number that you can be directly reached. If you still have difficulty reaching the provider, please page the [facility] (Director on Call) for the Hospitalists listed on amion for assistance.  10/20/2023, 2:59 PM

## 2023-10-20 NOTE — Progress Notes (Signed)
 Patient has been having frequent colostomy outputs, for my shift until now, smells more like blood, looks dark red in color, different than the usual output she had on my previous shifts, MD is aware, she is positive for occult blood, and hgb been trending down and has been receiving transfusions, will continue to monitor.

## 2023-10-20 NOTE — TOC Progression Note (Signed)
 Transition of Care [facility]) - Progression Note    Patient Details  Name: Felicia Frank MRN: [id number] Date of Birth: 04-05-1946  [facility] CM/SW Contact  [place]ville Lax, [place] Phone Number: 10/20/2023, 10:04 AM  Clinical Narrative:    CSW updated that pt may be medically ready Mon/Tues. PT will need to see pt again tomorrow if possible and then insurance auth will be started for SNF at [facility]. CSW updated [name] in admissions of likely date of D/C. TOC to follow.   Expected Discharge Plan: Home w Home Health Services Barriers to Discharge: Continued Medical Work up  Expected Discharge Plan and Services In-house Referral: Clinical Social Work Discharge Planning Services: CM Consult Post Acute Care Choice: Home Health Living arrangements for the past 2 months: Single Family Home                                       Social Determinants of Health (SDOH) Interventions SDOH Screenings   Food Insecurity: No Food Insecurity (10/08/2023)  Housing: Low Risk  (10/08/2023)  Transportation Needs: No Transportation Needs (10/08/2023)  Utilities: Not At Risk (10/08/2023)  Social Connections: Socially Integrated (10/08/2023)  Tobacco Use: Low Risk  (10/13/2023)    Readmission Risk Interventions    10/10/2023   11:26 AM 10/07/2023   10:06 PM 09/16/2023   10:04 PM  Readmission Risk Prevention Plan  Post Dischage Appt   Complete  Medication Screening   Complete  Transportation Screening Complete Complete Complete  PCP or Specialist Appt within 5-7 Days  Complete   Home Care Screening  Complete   Medication Review (RN CM)  Complete   HRI or Home Care Consult Complete    Social Work Consult for Recovery Care Planning/Counseling Complete    Palliative Care Screening Not Applicable    Medication Review Oceanographer) Complete

## 2023-10-21 ENCOUNTER — Inpatient Hospital Stay (HOSPITAL_COMMUNITY)

## 2023-10-21 ENCOUNTER — Encounter (HOSPITAL_COMMUNITY): Payer: Self-pay | Admitting: Internal Medicine

## 2023-10-21 ENCOUNTER — Encounter (HOSPITAL_COMMUNITY)
Admission: EM | Disposition: A | Discharge status: Skilled Nursing Facility | Payer: Self-pay | Source: Home / Self Care | Attending: Internal Medicine

## 2023-10-21 DIAGNOSIS — I129 Hypertensive chronic kidney disease with stage 1 through stage 4 chronic kidney disease, or unspecified chronic kidney disease: Secondary | ICD-10-CM

## 2023-10-21 DIAGNOSIS — K259 Gastric ulcer, unspecified as acute or chronic, without hemorrhage or perforation: Secondary | ICD-10-CM

## 2023-10-21 DIAGNOSIS — D62 Acute posthemorrhagic anemia: Secondary | ICD-10-CM

## 2023-10-21 DIAGNOSIS — E039 Hypothyroidism, unspecified: Secondary | ICD-10-CM

## 2023-10-21 DIAGNOSIS — K5720 Diverticulitis of large intestine with perforation and abscess without bleeding: Secondary | ICD-10-CM | POA: Diagnosis not present

## 2023-10-21 DIAGNOSIS — N1831 Chronic kidney disease, stage 3a: Secondary | ICD-10-CM | POA: Diagnosis not present

## 2023-10-21 DIAGNOSIS — N179 Acute kidney failure, unspecified: Secondary | ICD-10-CM | POA: Diagnosis not present

## 2023-10-21 DIAGNOSIS — E876 Hypokalemia: Secondary | ICD-10-CM | POA: Diagnosis not present

## 2023-10-21 LAB — BASIC METABOLIC PANEL WITH GFR
Anion gap: 5 (ref 5–15)
BUN: 60 mg/dL — ABNORMAL HIGH (ref 8–23)
CO2: 19 mmol/L — ABNORMAL LOW (ref 22–32)
Calcium: 7.5 mg/dL — ABNORMAL LOW (ref 8.9–10.3)
Chloride: 105 mmol/L (ref 98–111)
Creatinine, Ser: 1.24 mg/dL — ABNORMAL HIGH (ref 0.44–1.00)
GFR, Estimated: 45 mL/min — ABNORMAL LOW (ref >60)
Glucose, Bld: 143 mg/dL — ABNORMAL HIGH (ref 70–99)
Potassium: 5.1 mmol/L (ref 3.5–5.1)
Sodium: 129 mmol/L — ABNORMAL LOW (ref 135–145)

## 2023-10-21 LAB — PREPARE RBC (CROSSMATCH)

## 2023-10-21 LAB — GLUCOSE, CAPILLARY
Glucose-Capillary: 144 mg/dL — ABNORMAL HIGH (ref 70–99)
Glucose-Capillary: 145 mg/dL — ABNORMAL HIGH (ref 70–99)
Glucose-Capillary: 134 mg/dL — ABNORMAL HIGH (ref 70–99)
Glucose-Capillary: 141 mg/dL — ABNORMAL HIGH (ref 70–99)
Glucose-Capillary: 106 mg/dL — ABNORMAL HIGH (ref 70–99)

## 2023-10-21 LAB — CBC
HCT: 19.5 % — ABNORMAL LOW (ref 36.0–46.0)
Hemoglobin: 6.4 g/dL — CL (ref 12.0–15.0)
MCH: 30.8 pg (ref 26.0–34.0)
MCHC: 32.8 g/dL (ref 30.0–36.0)
MCV: 93.8 fL (ref 80.0–100.0)
Platelets: 141 10*3/uL — ABNORMAL LOW (ref 150–400)
RBC: 2.08 MIL/uL — ABNORMAL LOW (ref 3.87–5.11)
RDW: 15.1 % (ref 11.5–15.5)
WBC: 18.9 10*3/uL — ABNORMAL HIGH (ref 4.0–10.5)
nRBC: 2.8 % — ABNORMAL HIGH (ref 0.0–0.2)

## 2023-10-21 LAB — HEMOGLOBIN AND HEMATOCRIT, BLOOD
HCT: 26.4 % — ABNORMAL LOW (ref 36.0–46.0)
Hemoglobin: 9.2 g/dL — ABNORMAL LOW (ref 12.0–15.0)

## 2023-10-21 LAB — PHOSPHORUS: Phosphorus: 3.6 mg/dL (ref 2.5–4.6)

## 2023-10-21 LAB — MAGNESIUM: Magnesium: 1.9 mg/dL (ref 1.7–2.4)

## 2023-10-21 SURGERY — EGD (ESOPHAGOGASTRODUODENOSCOPY)
Anesthesia: General

## 2023-10-21 MED ORDER — PHENYLEPHRINE HCL (PRESSORS) 10 MG/ML IV SOLN
INTRAVENOUS | Status: DC | PRN
  Administered 2023-10-21 (×3): 160 ug via INTRAVENOUS

## 2023-10-21 MED ORDER — PROPOFOL 500 MG/50ML IV EMUL
INTRAVENOUS | Status: DC | PRN
  Administered 2023-10-21: 70 mg via INTRAVENOUS
  Administered 2023-10-21: 100 ug/kg/min via INTRAVENOUS

## 2023-10-21 MED ORDER — TRAVASOL 10 % IV SOLN
INTRAVENOUS | Status: AC
  Filled 2023-10-21: qty 900

## 2023-10-21 MED ORDER — SODIUM CHLORIDE 0.9 % IV SOLN: INTRAVENOUS | Status: DC

## 2023-10-21 MED ORDER — SODIUM CHLORIDE 0.9% IV SOLUTION: Freq: Once | INTRAVENOUS | Status: AC

## 2023-10-21 MED ORDER — FLUCONAZOLE IN SODIUM CHLORIDE 200-0.9 MG/100ML-% IV SOLN
200.0000 mg | INTRAVENOUS | Status: DC
  Filled 2023-10-21: qty 100

## 2023-10-21 MED ORDER — ALBUMIN HUMAN 25 % IV SOLN
25.0000 g | Freq: Four times a day (QID) | INTRAVENOUS | Status: AC
  Administered 2023-10-21 – 2023-10-22 (×3): 25 g via INTRAVENOUS
  Filled 2023-10-21 (×3): qty 100

## 2023-10-21 MED ORDER — LACTATED RINGERS IV SOLN: INTRAVENOUS | Status: DC

## 2023-10-21 MED ORDER — FLUCONAZOLE IN SODIUM CHLORIDE 400-0.9 MG/200ML-% IV SOLN
800.0000 mg | Freq: Once | INTRAVENOUS | Status: DC
  Filled 2023-10-21: qty 400

## 2023-10-21 MED ORDER — FLUCONAZOLE IN SODIUM CHLORIDE 400-0.9 MG/200ML-% IV SOLN
400.0000 mg | Freq: Once | INTRAVENOUS | Status: DC
Start: 2023-10-21 — End: 2023-10-21
  Filled 2023-10-21: qty 200

## 2023-10-21 MED ORDER — SODIUM CHLORIDE 0.9 % IV SOLN
100.0000 mg | INTRAVENOUS | Status: DC
  Administered 2023-10-21 – 2023-10-25 (×5): 100 mg via INTRAVENOUS
  Filled 2023-10-21 (×6): qty 5

## 2023-10-21 MED ORDER — SODIUM CHLORIDE 0.9% IV SOLUTION: Freq: Once | INTRAVENOUS | Status: DC

## 2023-10-21 MED ORDER — FUROSEMIDE 10 MG/ML IJ SOLN
20.0000 mg | Freq: Four times a day (QID) | INTRAMUSCULAR | Status: AC
  Administered 2023-10-21 (×2): 20 mg via INTRAVENOUS
  Filled 2023-10-21 (×2): qty 2

## 2023-10-21 MED ORDER — FLUCONAZOLE IN SODIUM CHLORIDE 400-0.9 MG/200ML-% IV SOLN
400.0000 mg | Freq: Once | INTRAVENOUS | Status: DC
  Filled 2023-10-21: qty 200

## 2023-10-21 MED ORDER — LACTATED RINGERS IV SOLN: INTRAVENOUS | Status: DC | PRN

## 2023-10-21 NOTE — Plan of Care (Signed)

## 2023-10-21 NOTE — Progress Notes (Signed)
 2nd unit of PRBC hung and started at 1233. PACU transporting patient down to procedure area at 1240. Nurse to nurse report given at bedside and PACU RN made aware that q15 minute post blood start is due to 1248. Nurse expressed full understanding. Patient alert and oriented at time of transfer, on Room air and no complaints. Foley cath and JP emptied at time of transfer and bed linen changed with patient wash down due to extremities weeping. Family at bedside and aware of patient going to procedure. PACU nurse will obtain consent since we were unable to get due to being busy with tasks although patient aware she was going to have procedure.

## 2023-10-21 NOTE — Transfer of Care (Signed)
 Immediate Anesthesia Transfer of Care Note  Patient: Felicia Frank  Procedure(s) Performed: EGD (ESOPHAGOGASTRODUODENOSCOPY)  Patient Location: [facility]  Anesthesia Type:General  Level of Consciousness: awake and alert   Airway & Oxygen Therapy: Patient Spontanous Breathing and Patient connected to nasal cannula oxygen  Post-op Assessment: Report given to RN and Post -op Vital signs reviewed and stable  Post vital signs: Reviewed and stable  Last Vitals:  Vitals Value Taken Time  BP    Temp    Pulse    Resp    SpO2      Last Pain:  Vitals:   10/21/23 1314  TempSrc:   PainSc: 0-No pain      Patients Stated Pain Goal: 0 (10/21/23 0900)  Complications: No notable events documented.

## 2023-10-21 NOTE — Op Note (Signed)
 [facility] Patient Name: Felicia Frank Procedure Date: 10/21/2023 12:56 PM MRN: [id number] Date of Birth: 24-May-1946 Attending MD: Rolando Cliche. Mordechai April , [place], [id number] CSN: [id number] Age: 78 Admit Type: Inpatient Procedure:                Upper GI endoscopy Indications:              Acute post hemorrhagic anemia Providers:                Rolando Cliche. Mordechai April, DO, Willena Harp, Sharlette Dayhoff Technician, Technician Referring MD:              Medicines:                See the Anesthesia note for documentation of the                            administered medications Complications:            No immediate complications. Estimated Blood Loss:     Estimated blood loss: none. Procedure:                Pre-Anesthesia Assessment:                           - The anesthesia plan was to use monitored                            anesthesia care (MAC).                           After obtaining informed consent, the endoscope was                            passed under direct vision. Throughout the                            procedure, the patient's blood pressure, pulse, and                            oxygen saturations were monitored continuously. The                            [id number]-[id number] ([id number]) scope was introduced through the                            mouth, and advanced to the second part of duodenum.                            The upper GI endoscopy was accomplished without                            difficulty. The patient tolerated the procedure                            well. Scope In: 1:22:47  PM Scope Out: 1:25:16 PM Total Procedure Duration: 0 hours 2 minutes 29 seconds  Findings:      The examined esophagus was normal.      One non-bleeding cratered gastric ulcer with a clean ulcer base (Forrest       Class III) was found on the lesser curvature of the stomach likely       related to prior NGT. The lesion was 4 mm in largest dimension.      The  duodenal bulb, first portion of the duodenum and second portion of       the duodenum were normal. Impression:               - Normal esophagus.                           - Non-bleeding gastric ulcer with a clean ulcer                            base (Forrest Class III).                           - Normal duodenal bulb, first portion of the                            duodenum and second portion of the duodenum.                           - No specimens collected.                           - No blood found in entire upper GI tract Moderate Sedation:      Per Anesthesia Care Recommendation:           - Return patient to hospital ward for ongoing care.                           - Continue IV PPI BID while inpatient, transition                            to PO BID x12 weeks after DC                           - Avoid NSAIDs                           - Further recs per surgical team. Discussed with                            Dr. Larrie Po Procedure Code(s):        --- Professional ---                           [phone], Esophagogastroduodenoscopy, flexible,                            transoral; diagnostic, including collection of  specimen(s) by brushing or washing, when performed                            (separate procedure) Diagnosis Code(s):        --- Professional ---                           K25.9, Gastric ulcer, unspecified as acute or                            chronic, without hemorrhage or perforation                           D62, Acute posthemorrhagic anemia CPT copyright 2022 American Medical Association. All rights reserved. The codes documented in this report are preliminary and upon coder review may  be revised to meet current compliance requirements. Rolando Cliche. Mordechai April, DO Rolando Cliche. Mordechai April, DO 10/21/2023 1:28:31 PM This report has been signed electronically. Number of Addenda: 0

## 2023-10-21 NOTE — Progress Notes (Signed)
 PHARMACY - TOTAL PARENTERAL NUTRITION CONSULT NOTE   Indication:  S/p partial colectomy, ileocecectomy, partial resection.  Patient Measurements: Height: 5\' 4"  (162.6 cm) Weight: 92.5 kg (203 lb 14.8 oz) IBW/kg (Calculated) : 54.7 TPN AdjBW (KG): 62.4 Body mass index is 35 kg/m. Usual Weight: Unknown  Assessment: 78 year old female hx Afib, CKD, obesity admitted with perforated sigmoid diverticulitis. Partial colectomy with end colostomy, ileocecectomy, partial small bowel resection. At risk for refeeding d/t minimal intake since admission. Patient having bleeding issues, heparin  infusion held. Plan to advance to clear liquid diet and remove NG tube and continue TPN until tolerating solid food.   Glucose / Insulin : No prior Hx DM, CBGs 164-213. 10-units given in past 24 hours  Electrolytes:   Na 132 phos 1.6 > 3.4 K 4.5> 5.1  Renal: Hx CKD, Scr 1.1 Hepatic: WNL  GI Surgeries / Procedures:  5/15 partial colectomy/small bowel resection  Central access: Double lumen PICC since 5/11 TPN start date: 5/16  Nutritional Goals: Goal TPN rate is 75 ml/hr over 24 hours (provides 90 g of protein and 1800 kcals per day)   RD Assessment: Estimated Needs Total Energy Estimated Needs: 1700-1900 Total Protein Estimated Needs: 90-110 gm Total Fluid Estimated Needs: 1.7-1.9 L  Current Nutrition:  NPO. 100% nutritional needs from TPN  Plan:  Continue TPN  at 75 mL/hr  Electrolytes in TPN: Na 100 mEq/L, K 0 mEq/L, Ca 32mEq/L, Mg 10 mEq/L, and Phos 60mmol/L.  Cl:Ac 1:1 Add standard MVI and trace elements to TPN Add Thiamine 100mg  to TPN Continue Moderate q8h SSI and adjust as needed  Monitor TPN labs on Mon/Thurs, F/U BMET in AM  Lora Robinsons, BS Pharm D, BCPS Clinical Pharmacist 10/21/2023 7:33 AM

## 2023-10-21 NOTE — Progress Notes (Addendum)
 Pharmacy Antibiotic Note  Felicia Frank is a 78 y.o. female admitted on 10/07/2023 now with intra-abdominal infection.  Pharmacy has been consulted for micafungin dosing. Candida Albicans Positive in intra-abdominal wound.  Plan: Micafungin 100mg  IV q24h F/u cxs and clinical progress Monitor V/S, labs  Height: 5\' 4"  (162.6 cm) Weight: 92.5 kg (203 lb 14.8 oz) IBW/kg (Calculated) : 54.7  Temp (24hrs), Avg:98.2 F (36.8 C), Min:97.8 F (36.6 C), Max:98.3 F (36.8 C)  Recent Labs  Lab 10/17/23 0407 10/18/23 0848 10/18/23 0951 10/19/23 0016 10/19/23 0134 10/19/23 1140 10/20/23 0425 10/21/23 0450  WBC 7.5   < > 7.9  --  9.2 10.4 11.4* 18.9*  CREATININE 1.36*  --  1.21* 1.17*  --   --  1.10* 1.24*   < > = values in this interval not displayed.    Estimated Creatinine Clearance: 41.2 mL/min (A) (by C-G formula based on SCr of 1.24 mg/dL (H)).    Allergies  Allergen Reactions   Amlodipine Swelling   Clonidine Rash    Rash with patch only.  Okay to take pill    Antimicrobials this admission: micafungin 5/23 >>  Zosyn  5/9 >>5/21  Microbiology results: 5/15 Wound cx(abscess): rare Candida albicans  Thank you for allowing pharmacy to be a part of this patient's care.  Deontre Allsup, BS Pharm D, BCPS Clinical Pharmacist 10/21/2023 8:44 AM

## 2023-10-21 NOTE — Progress Notes (Signed)
 Patient seen briefly this morning.  She has a 78 year old female with history of hypothyroidism, HTN, CKD stage IIIa, obesity, and A-fib with recent hospitalization secondary to diverticulitis with perforation and contained abscess s/p drain placement and enteral fistula formation who presented this admission with nausea and vomiting and subsequently underwent small bowel resection with Hartman's pouch placement and ileocecectomy with ostomy placement on 5/15.  She has had decline in her hemoglobin earlier this week and therefore GI was consulted, overall her hemoglobin has dropped from 10.2-6.4 and was transfused and then had further drop again into the 6 range requiring additional transfusion with decline again yesterday to 5.9 she was transfused 2 units yesterday with only improvement to 6.6 this morning, additional unit transfusing currently.  No overt blood identified in ostomy bag today on examination.  Abdomen was soft but mildly tender.  Dressing remained to midline abdomen.  Drain site with dressing in place and not soiled.  No other overt bleeding has been identified today. Prior reports of melanotic stool within the ostomy.   CTA GI bleed study 5/21 IMPRESSION: 1. No active gastrointestinal hemorrhage identified. 2. Intraluminal blood product within the small bowel in the region of the small bowel anastomosis resulting in a partial small bowel obstruction. 3. Surgical changes of partial small and large bowel resection with descending colostomy and Hartmann pouch formation. 4. Stable subacute appearing superior endplate fracture of L1.  Dr. Larrie Po has discussed case with Dr. Mordechai April this morning and requested upper endoscopy today for further evaluation of potential source of bleeding.  Given recent surgery and stress etiology includes gastritis, duodenitis, peptic ulcer disease, ischemia, AVMs, etc.  Decline in hemoglobin occurred initially with initiation of heparin  infusion and this was  subsequently stopped by general surgery.  Discussed with patient today as well as her daughter.  I have discussed the risks, alternatives, benefits with regards to but not limited to the risk of reaction to medication, bleeding, infection, perforation and the patient is agreeable to proceed. Written consent to be obtained.    Vital signs are stable currently and has blood transfusing, will prepare additional units to keep ahead if needed.   Julian Obey, MSN, APRN, FNP-BC, AGACNP-BC [facility] at [facility]

## 2023-10-21 NOTE — Progress Notes (Addendum)
 8 Days Post-Op  Subjective: Patient denies any new symptoms.  Objective: Vital signs in last 24 hours: Temp:  [97.8 F (36.6 C)-98.8 F (37.1 C)] 98.2 F (36.8 C) (05/23 0517) Pulse Rate:  [55-129] 95 (05/23 0530) Resp:  [10-27] 25 (05/23 0530) BP: (87-155)/(29-100) 103/64 (05/23 0530) SpO2:  [94 %-100 %] 99 % (05/23 0530) Weight:  [92.5 kg] 92.5 kg (05/23 0517) Last BM Date : 10/20/23  Intake/Output from previous day: 05/22 0701 - 05/23 0700 In: 3132.7 [P.O.:340; I.V.:1820.5; Blood:972.3] Out: 1995 [Urine:925; Drains:45; Stool:1025] Intake/Output this shift: No intake/output data recorded.  General appearance: alert, cooperative, and fatigued GI: Soft, incision healing well.  Ostomy pink and patent.  Melanotic output noted.  JP drainage minimal and serous in nature.  Lab Results:  Recent Labs    10/20/23 0425 10/20/23 1732 10/21/23 0450  WBC 11.4*  --  18.9*  HGB 6.6* 7.5* 6.4*  HCT 18.9* 20.6* 19.5*  PLT 125*  --  141*   BMET Recent Labs    10/20/23 0425 10/21/23 0450  NA 132* 129*  K 4.5 5.1  CL 104 105  CO2 22 19*  GLUCOSE 138* 143*  BUN 55* 60*  CREATININE 1.10* 1.24*  CALCIUM 7.3* 7.5*   PT/INR No results for input(s): "LABPROT", "INR" in the last 72 hours.  Studies/Results: No results found.  Anti-infectives: Anti-infectives (From admission, onward)    Start     Dose/Rate Route Frequency Ordered Stop   10/13/23 1200  cefoTEtan  (CEFOTAN ) 2 g in sodium chloride  0.9 % 100 mL IVPB        2 g 200 mL/hr over 30 Minutes Intravenous On call to O.R. 10/12/23 0836 10/13/23 1401   10/13/23 1128  sodium chloride  0.9 % with cefoTEtan  (CEFOTAN ) ADS Med       Note to Pharmacy: Gabino Joe S: cabinet override      10/13/23 1128 10/13/23 1357   10/12/23 1300  metroNIDAZOLE  (FLAGYL ) tablet 500 mg        500 mg Oral 3 times daily 10/11/23 1212 10/12/23 2116   10/12/23 1300  neomycin  (MYCIFRADIN ) tablet 1,000 mg        1,000 mg Oral 3 times daily 10/11/23  1212 10/12/23 2136   10/11/23 1300  neomycin  (MYCIFRADIN ) tablet 1,000 mg  Status:  Discontinued        1,000 mg Oral 3 times daily 10/11/23 0839 10/11/23 1212   10/11/23 1300  metroNIDAZOLE  (FLAGYL ) tablet 500 mg  Status:  Discontinued        500 mg Oral 3 times daily 10/11/23 0839 10/11/23 1212   10/07/23 1500  piperacillin -tazobactam (ZOSYN ) IVPB 3.375 g        3.375 g 12.5 mL/hr over 240 Minutes Intravenous Every 8 hours 10/07/23 1356 10/19/23 1400       Assessment/Plan: s/p Procedure(s): PARTIAL COLECTOMY, WITH COLOSTOMY CREATION, PARTIAL SMALL BOWEL RESECTION, ILEOCECECTOMY Impression: Postoperative day 8.  Patient's hemoglobin continues to drop despite multiple blood transfusions.  Have discussed with Dr. Mordechai April of GI who agrees that an EGD is indicated to rule out an upper GI source of her bleeding.  Due to lower anastomoses, a scope through the colostomy is contraindicated.  Unknown why she has developed a leukocytosis.  Recent CT scan of abdomen negative for intra-abdominal abscess.  Given her history of rare Candida on a deep tissue culture done at the time of surgery, will initiate treatment.  I have ordered 2 units of packed red blood cells.  Patient aware.  LOS: 14 days    [name] 10/21/2023

## 2023-10-21 NOTE — Anesthesia Preprocedure Evaluation (Addendum)
 Anesthesia Evaluation  Patient identified by MRN, date of birth, ID band Patient awake    Reviewed: Allergy & Precautions, H&P , NPO status , Patient's Chart, lab work & pertinent test results  Airway Mallampati: III  TM Distance: >3 FB Neck ROM: Full  Mouth opening: Limited Mouth Opening  Dental no notable dental hx.    Pulmonary neg pulmonary ROS   Pulmonary exam normal breath sounds clear to auscultation       Cardiovascular hypertension, Normal cardiovascular exam Rhythm:Regular Rate:Tachycardia     Neuro/Psych negative neurological ROS  negative psych ROS   GI/Hepatic Neg liver ROS,GERD  ,,  Endo/Other  Hypothyroidism    Renal/GU Renal disease  negative genitourinary   Musculoskeletal  (+) Arthritis ,    Abdominal   Peds negative pediatric ROS (+)  Hematology  (+) Blood dyscrasia, anemia   Anesthesia Other Findings   Reproductive/Obstetrics negative OB ROS                             Anesthesia Physical Anesthesia Plan  ASA: 4 and emergent  Anesthesia Plan: General   Post-op Pain Management:    Induction:   PONV Risk Score and Plan:   Airway Management Planned: Nasal Cannula  Additional Equipment:   Intra-op Plan:   Post-operative Plan:   Informed Consent: I have reviewed the patients History and Physical, chart, labs and discussed the procedure including the risks, benefits and alternatives for the proposed anesthesia with the patient or authorized representative who has indicated his/her understanding and acceptance.     Dental advisory given  Plan Discussed with:   Anesthesia Plan Comments:        Anesthesia Quick Evaluation

## 2023-10-21 NOTE — Progress Notes (Signed)
 Rounding Note    Patient Name: Gelila Well Date of Encounter: 10/21/2023  [facility] Cardiologist: New  Subjective   No acute events overnight  Inpatient Medications    Scheduled Meds:  sodium chloride    Intravenous Once   acetaminophen   1,000 mg Oral Q6H   Chlorhexidine  Gluconate Cloth  6 each Topical Daily   insulin  aspart  0-15 Units Subcutaneous Q8H   levothyroxine   50 mcg Oral Daily   metoprolol  tartrate  5 mg Intravenous Q6H   pantoprazole  (PROTONIX ) IV  40 mg Intravenous Q12H   sodium chloride  flush  10-40 mL Intracatheter Q12H   Continuous Infusions:  amiodarone  30 mg/hr (10/21/23 0010)   TPN ADULT (ION) 75 mL/hr at 10/21/23 0523   PRN Meds: HYDROmorphone  (DILAUDID ) injection, ondansetron  **OR** ondansetron  (ZOFRAN ) IV, oxyCODONE , prochlorperazine , sodium chloride  flush   Vital Signs    Vitals:   10/21/23 0430 10/21/23 0500 10/21/23 0517 10/21/23 0530  BP: 107/62 95/75  103/64  Pulse: 92 90 91 95  Resp: 12 15 14  (!) 25  Temp:   98.2 F (36.8 C)   TempSrc:   Axillary   SpO2: 99% 100% 99% 99%  Weight:   92.5 kg   Height:        Intake/Output Summary (Last 24 hours) at 10/21/2023 0804 Last data filed at 10/21/2023 0523 Gross per 24 hour  Intake 3132.73 ml  Output 1995 ml  Net 1137.73 ml      10/21/2023    5:17 AM 10/20/2023    5:00 AM 10/19/2023    4:30 AM  Last 3 Weights  Weight (lbs) 203 lb 14.8 oz 189 lb 2.5 oz 187 lb 13.3 oz  Weight (kg) 92.5 kg 85.8 kg 85.2 kg      Telemetry    NSR - Personally Reviewed  ECG    N/a - Personally Reviewed  Physical Exam   GEN: No acute distress.   Neck: No JVD Cardiac: RRR, no murmurs, rubs, or gallops.  Respiratory: Clear to auscultation bilaterally. GI: Soft, nontender, non-distended  MS: 2+bilateral LE edema Neuro:  Nonfocal  Psych: Normal affect   Labs    High Sensitivity Troponin:  No results for input(s): "TROPONINIHS" in the last 720 hours.   Chemistry Recent Labs  Lab  10/15/23 0456 10/16/23 0620 10/17/23 0407 10/18/23 0951 10/19/23 0016 10/20/23 0425 10/21/23 0450  NA 132*   < > 132*   < > 135 132* 129*  K 3.5   < > 3.8   < > 4.7 4.5 5.1  CL 101   < > 96*   < > 99 104 105  CO2 24   < > 32   < > 25 22 19*  GLUCOSE 122*   < > 123*   < > 170* 138* 143*  BUN 23   < > 33*   < > 50* 55* 60*  CREATININE 1.63*   < > 1.36*   < > 1.17* 1.10* 1.24*  CALCIUM 8.2*   < > 8.0*   < > 7.7* 7.3* 7.5*  MG 1.9   < > 1.9   < > 1.6* 1.9 1.9  PROT 4.5*  --  4.8*  --   --  3.5*  --   ALBUMIN  2.8*  --  2.3*  --   --  1.6*  --   AST 14*  --  14*  --   --  23  --   ALT 12  --  11  --   --  22  --   ALKPHOS 23*  --  33*  --   --  35*  --   BILITOT 0.3  --  0.5  --   --  0.5  --   GFRNONAA 32*   < > 40*   < > 48* 51* 45*  ANIONGAP 7   < > 4*   < > 7 6 5    < > = values in this interval not displayed.    Lipids  Recent Labs  Lab 10/17/23 0407  TRIG 122    Hematology Recent Labs  Lab 10/19/23 1140 10/19/23 1458 10/20/23 0425 10/20/23 1732 10/21/23 0450  WBC 10.4  --  11.4*  --  18.9*  RBC 2.37*  --  2.05*  --  2.08*  HGB 7.4*   < > 6.6* 7.5* 6.4*  HCT 21.4*   < > 18.9* 20.6* 19.5*  MCV 90.3  --  92.2  --  93.8  MCH 31.2  --  32.2  --  30.8  MCHC 34.6  --  34.9  --  32.8  RDW 16.1*  --  15.4  --  15.1  PLT 127*  --  125*  --  141*   < > = values in this interval not displayed.   Thyroid No results for input(s): "TSH", "FREET4" in the last 168 hours.  BNP Recent Labs  Lab 10/18/23 0848  BNP 85.0    DDimer No results for input(s): "DDIMER" in the last 168 hours.   Radiology    No results found.  Cardiac Studies   08/2023 echo 1. Left ventricular ejection fraction, by estimation, is 65 to 70%. The  left ventricle has normal function. The left ventricle has no regional  wall motion abnormalities. Left ventricular diastolic parameters are  indeterminate.   2. Right ventricular systolic function is normal. The right ventricular  size is normal.  Tricuspid regurgitation signal is inadequate for assessing  PA pressure.   3. The mitral valve is normal in structure. No evidence of mitral valve  regurgitation. No evidence of mitral stenosis.   4. The aortic valve is tricuspid. Aortic valve regurgitation is not  visualized. No aortic stenosis is present.   Patient Profile     Kennah Hehr is a 78 y.o. female with a hx of hypothyroidism, primary hypertension, chronic kidney disease stage IIIa, class I obesity, paroxysmal atrial fibrillation with recent hospitalization secondary to diverticulitis with perforation and contained abscess status post drain placement and internal fistula formation, who is being seen 10/17/2023 for the evaluation of atrial fibrillation with RVR at the request of Dr Mason Sole.   Assessment & Plan    1.PAF - Afib was new diagnosis during 08/2023 admission with perforated diverticulitis. Managed my medicine team that admission. Discharged and home regimen was amio 200mg  daily, lopressor  50mg  bid. Anticoag not started in case surgery was needed in near future for bowel perforation   - recurrent issues with afib with RVR in setting of ongoing bowel perforation, she had surgery 5/15 Has been NPO, on IV amio gtt this admission along with IV lopressot 5mg  q6 hrs.  - was started on hep gtt, with drop in Hgb discontinued - can transition to IV amio when more stable from PO intake standpoint.       2. LE edema - echo LVE 65-70%, indet diastolic, normal RV function - BNP was 80s yesterday. Sats 100% on RA.  - diffusely edematous upper and lower extremities, likely related to low albumin   which was 1.6 yesterday and related third spacing. Lungs are clear.   3. Anemia - multiple transfusions this admissino - now off heparin  - from surgery note plans for EGD    For questions or updates, please contact [facility] Please consult [email or web address] for contact info under        Signed, Armida Lander, MD   10/21/2023, 8:04 AM

## 2023-10-21 NOTE — Interval H&P Note (Signed)
 History and Physical Interval Note:  10/21/2023 12:56 PM  Felicia Frank  has presented today for surgery, with the diagnosis of anemia.  The various methods of treatment have been discussed with the patient and family. After consideration of risks, benefits and other options for treatment, the patient has consented to  Procedure(s): EGD (ESOPHAGOGASTRODUODENOSCOPY) (N/A) as a surgical intervention.  The patient's history has been reviewed, patient examined, no change in status, stable for surgery.  I have reviewed the patient's chart and labs.  Questions were answered to the patient's satisfaction.     [name]

## 2023-10-21 NOTE — Plan of Care (Signed)
?  Problem: Education: ?Goal: Knowledge of General Education information will improve ?Description: Including pain rating scale, medication(s)/side effects and non-pharmacologic comfort measures ?Outcome: Progressing ?  ?Problem: Clinical Measurements: ?Goal: Ability to maintain clinical measurements within normal limits will improve ?Outcome: Progressing ?Goal: Respiratory complications will improve ?Outcome: Progressing ?  ?Problem: Nutrition: ?Goal: Adequate nutrition will be maintained ?Outcome: Progressing ?  ?

## 2023-10-21 NOTE — Care Management Important Message (Signed)
 Important Message  Patient Details  Name: Felicia Frank MRN: [id number] Date of Birth: [birth year]   Important Message Given:  Yes - Medicare IM (copy left on bedside table)     Neila Bally 10/21/2023, 1:04 PM

## 2023-10-21 NOTE — Consult Note (Signed)
 WOC Nurse ostomy follow up Current pouch is intact with good seal.  It was changed last night by the bedside nurse. There is currently no stool in the pouch.   Pt is still in ICU. Stoma is red and viable when visualized through the pouch.  Educational materials at the bedside, along with 3 sets of each supply.  Use supplies: Use Supplies: barrier ring Timm Foot # [id number] and convex pouch Lawson # [id number] Enrolled patient in [place] Secure Start Discharge program: Yes, previously Pt plans to d/c to a SNF, according to progress notes, and will require total assistance at this time with pouch application and emptying.  Thank-you,  Wiliam Harder MSN, RN, CWOCN, [place], CNS [phone]

## 2023-10-21 NOTE — Progress Notes (Signed)
 PROGRESS NOTE   Felicia Frank  NWG:[id number] DOB: 23-Jul-1945 DOA: 10/07/2023 PCP: Felicia Ishikawa, MD   Chief Complaint  Patient presents with   Abdominal Pain   Level of care: ICU  Brief Admission History:  78 y.o. female with medical history significant of hypothyroidism, hypertension, chronic kidney disease stage IIIa, class I obesity, atrial fibrillation and recent hospitalization secondary to diverticulitis with perforation and contained abscess status post drain placement and enteral fistula formation; who presented to the hospital secondary to ongoing abdominal pain, associated nausea/intermittent vomiting and difficulty keeping things down.  Patient has been admitted with diverticulitis of the colon with perforation and enterocolonic fistula.  She is status post Hartman's procedure with small bowel resection and ileocecectomy on 5/15.  Patient remains on TPN and Lasix  and continues to have some elevated heart rates requiring recurrent amiodarone  bolus and ongoing drip as well as IV metoprolol .  Surgery team following and Cardiology consulted for assistance in management as patient remains on IV amiodarone  infusion.      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 - Completed IV antibiotics with Zosyn  for 5 days postoperatively thru 5/21 - Appreciate general surgery recommendations to advance to clear liquid diet and remove NG tube and continue TPN until tolerating solid food. - Started on Lasix  5/16 and -5.1 L fluid balance noted in the last 24 hours with stable creatinine levels with -7.8 L fluid balance since initiation of Lasix , repeat lasix  IV 20 mg q6 h x 3 doses on 5/23.  -Discontinued Foley catheter and monitor voiding output   Rare Candida species seen in deep wound culture - pharmacist consulted to start antifungal IV  therapy  Leukocytosis - WBC bump overnight - added antifungal coverage  Severe hypoalbuminemia with 3rd spacing  - IV albumin  25 gram every 6 hours x 3 doses ordered 5/23  - due to severe edema in upper arms, obtain venous doppler US  to rule out venous thromboembolism   Chronic paroxysmal atrial fibrillation with RVR-controlled - Will continue the use of adjusted dose IV metoprolol  and SR amiodarone  drip with loading dose repeated on 5/19 -Consult to cardiology for further assistance in management - IV heparin  stopped on 10/19/23 due to drop in Hg - continue IV amiodarone  infusion, cardiology rebolused on 5/20 and increased rate to 60 ml/hr, rebolused on 5/21, now back down to 30 mg/hr as heart rates have improved.    Acute hematemesis - resolved now  - pt reported vomited large amounts of black vomitus (500 mL) overnight; hg down to 6.4. CT angio GI bleed study negative for GI bleeding.  Intraluminal blood product noted within the small bowel in the region of the small bowel anastomosis resulting in a partial SBO.   - Pt is being treated supportively  - transfused 2 unit PRBC on 5/22, repeat transfusion of 2 units PRBC on 5/23    Acute blood loss anemia-Hg declined to 6.4 after restarting IV heparin  infusion - Status post 2 unit PRBC transfusion 5/17  - s/p 1 unit PRBC on 5/21, 3 units PRBC ordered on 5/22 - Hg back down to 6.4 on 5/23, transfuse 2 units PRBC - EGD to be done today to check for upper GI bleeding   essential hypertension - Continue to monitor    hypothyroidism - Continue Synthroid . - Taking p.o.   acute kidney injury in the setting of chronic kidney disease stage IIIa -  Creatinine improving with diuresis - Continue to monitor closely   GERD - Continue PPI   compression fracture of L1 - Continue as needed analgesia - Will recommend continued use of TLSO brace at discharge.   class I obesity -Body mass index is 32.28 kg/m. - Low-calorie diet and portion  control discussed with patient.    DVT prophylaxis: IV heparin  was resumed 5/19, stopped on 5/21 due to drop in Hg, SCDs Code Status: Full  Family Communication: bedside update 5/22 Disposition: anticipating SNF (Felicia Frank)   Consultants:  Surgery Cardiology  Procedures:   Antimicrobials:    Subjective: Pt say she feels unwell, she is disappointed she is not recovering as fast as she had hoped.    Objective: Vitals:   10/21/23 0920 10/21/23 0937 10/21/23 0952 10/21/23 1000  BP: 109/67 118/70 120/88 120/72  Pulse: 80 90 97 96  Resp: 17 18 20 17   Temp: 98.2 F (36.8 C)  98 F (36.7 C)   TempSrc: (P) Axillary  Axillary   SpO2: 97% 100% 100% 100%  Weight:      Height:        Intake/Output Summary (Last 24 hours) at 10/21/2023 1048 Last data filed at 10/21/2023 0920 Gross per 24 hour  Intake 2857.73 ml  Output 1995 ml  Net 862.73 ml   Filed Weights   10/19/23 0430 10/20/23 0500 10/21/23 0517  Weight: 85.2 kg 85.8 kg 92.5 kg   Examination:  General exam: Appears weak pale and somnolent, but NAD, cooperative.   Respiratory system: Clear to auscultation. Respiratory effort normal. Cardiovascular system: irregularly irregular normal S1 & S2 heard. trace pedal edema. Gastrointestinal system: Abdomen is nondistended, soft and nontender. Ostomy with black output seen. Less amount of melanotic output from ostomy seen. Central nervous system: Alert and oriented. No focal neurological deficits. Extremities: 2++ edema pitting Bilateral upper extremities.  Symmetric 5 x 5 power. Skin: No rashes, lesions or ulcers. Psychiatry: Judgement and insight appear normal. Mood & affect appropriate.   Data Reviewed: I have personally reviewed following labs and imaging studies  CBC: Recent Labs  Lab 10/18/23 0951 10/19/23 0134 10/19/23 1140 10/19/23 1458 10/19/23 2146 10/20/23 0425 10/20/23 1732 10/21/23 0450  WBC 7.9 9.2 10.4  --   --  11.4*  --  18.9*  HGB 10.2* 6.4* 7.4*  7.0* 5.9* 6.6* 7.5* 6.4*  HCT 32.0* 20.3* 21.4* 20.1* 17.0* 18.9* 20.6* 19.5*  MCV 93.0 94.9 90.3  --   --  92.2  --  93.8  PLT 127* 146* 127*  --   --  125*  --  141*    Basic Metabolic Panel: Recent Labs  Lab 10/17/23 0407 10/18/23 0951 10/19/23 0016 10/20/23 0425 10/21/23 0450  NA 132* 129* 135 132* 129*  K 3.8 4.3 4.7 4.5 5.1  CL 96* 93* 99 104 105  CO2 32 28 25 22  19*  GLUCOSE 123* 119* 170* 138* 143*  BUN 33* 40* 50* 55* 60*  CREATININE 1.36* 1.21* 1.17* 1.10* 1.24*  CALCIUM 8.0* 8.2* 7.7* 7.3* 7.5*  MG 1.9 1.7 1.6* 1.9 1.9  PHOS 2.4*  --  1.6* 3.4 3.6    CBG: Recent Labs  Lab 10/20/23 0545 10/20/23 0757 10/20/23 1359 10/20/23 2102 10/21/23 0509  GLUCAP 185* 164* 190* 213* 144*    Recent Results (from the past 240 hours)  Aerobic/Anaerobic Culture w Gram Stain (surgical/deep wound)     Status: None   Collection Time: 10/13/23  3:58 PM   Specimen: Path fluid;  GI  Result Value Ref Range Status   Specimen Description ABSCESS  Final   Special Requests INTRAABDOMINAL  Final   Gram Stain   Final    FEW WBC PRESENT, PREDOMINANTLY PMN NO ORGANISMS SEEN    Culture   Final    RARE CANDIDA ALBICANS NO ANAEROBES ISOLATED Performed at [facility], [address] [place], [place] [address]    Report Status 10/18/2023 FINAL  Final     Radiology Studies: No results found.   Scheduled Meds:  sodium chloride    Intravenous Once   acetaminophen   1,000 mg Oral Q6H   Chlorhexidine  Gluconate Cloth  6 each Topical Daily   insulin  aspart  0-15 Units Subcutaneous Q8H   levothyroxine   50 mcg Oral Daily   metoprolol  tartrate  5 mg Intravenous Q6H   pantoprazole  (PROTONIX ) IV  40 mg Intravenous Q12H   sodium chloride  flush  10-40 mL Intracatheter Q12H   Continuous Infusions:  albumin  human     amiodarone  30 mg/hr (10/21/23 0010)   micafungin (MYCAMINE) 100 mg in sodium chloride  0.9 % 100 mL IVPB     TPN ADULT (ION) 75 mL/hr at 10/21/23 0523   TPN ADULT  (ION)       LOS: 14 days   Critical Care Procedure Note Authorized and Performed by: Olga Berthold MD  Total Critical Care time:  57 mins Due to a high probability of clinically significant, life threatening deterioration, the patient required my highest level of preparedness to intervene emergently and I personally spent this critical care time directly and personally managing the patient.  This critical care time included obtaining a history; examining the patient, pulse oximetry; ordering and review of studies; arranging urgent treatment with development of a management plan; evaluation of patient's response of treatment; frequent reassessment; and discussions with other providers.  This critical care time was performed to assess and manage the high probability of imminent and life threatening deterioration that could result in multi-organ failure.  It was exclusive of separately billable procedures and treating other patients and teaching time.    Faustino Hook, MD How to contact the [facility] Attending or Consulting provider 7A - 7P or covering provider during after hours 7P -7A, for this patient?  Check the care team in [facility] and look for a) attending/consulting [facility] provider listed and b) the [facility] team listed Log into [email or web address] to find provider on call.  Locate the [facility] provider you are looking for under [facility] and page to a number that you can be directly reached. If you still have difficulty reaching the provider, please page the [facility] (Director on Call) for the Hospitalists listed on amion for assistance.  10/21/2023, 10:48 AM

## 2023-10-22 DIAGNOSIS — D62 Acute posthemorrhagic anemia: Secondary | ICD-10-CM

## 2023-10-22 DIAGNOSIS — K5720 Diverticulitis of large intestine with perforation and abscess without bleeding: Secondary | ICD-10-CM | POA: Diagnosis not present

## 2023-10-22 DIAGNOSIS — T8119XA Other postprocedural shock, initial encounter: Secondary | ICD-10-CM | POA: Diagnosis not present

## 2023-10-22 LAB — TYPE AND SCREEN
ABO/RH(D): A POS
Antibody Screen: NEGATIVE
Unit division: 0
Unit division: 0
Unit division: 0
Unit division: 0
Unit division: 0
Unit division: 0
Unit division: 0
Unit division: 0
Unit division: 0

## 2023-10-22 LAB — BPAM RBC
Blood Product Expiration Date: 202506082359
Blood Product Expiration Date: 202506172359
Blood Product Expiration Date: 202506172359
Blood Product Expiration Date: 202506212359
Blood Product Expiration Date: 202506242359
Blood Product Expiration Date: 202506252359
Blood Product Expiration Date: 202506252359
Blood Product Expiration Date: 202506252359
Blood Product Expiration Date: 202506282359
ISSUE DATE / TIME: 202505210554
ISSUE DATE / TIME: 202505212254
ISSUE DATE / TIME: 202505220806
ISSUE DATE / TIME: 202505221128
ISSUE DATE / TIME: 202505230926
ISSUE DATE / TIME: 202505231220
Unit Type and Rh: 5100
Unit Type and Rh: 5100
Unit Type and Rh: 5100
Unit Type and Rh: 5100
Unit Type and Rh: 6200
Unit Type and Rh: 6200
Unit Type and Rh: 6200
Unit Type and Rh: 6200
Unit Type and Rh: 6200

## 2023-10-22 LAB — COMPREHENSIVE METABOLIC PANEL WITH GFR
ALT: 40 U/L (ref 0–44)
AST: 33 U/L (ref 15–41)
Albumin: 3.1 g/dL — ABNORMAL LOW (ref 3.5–5.0)
Alkaline Phosphatase: 38 U/L (ref 38–126)
Anion gap: 6 (ref 5–15)
BUN: 61 mg/dL — ABNORMAL HIGH (ref 8–23)
CO2: 20 mmol/L — ABNORMAL LOW (ref 22–32)
Calcium: 8.1 mg/dL — ABNORMAL LOW (ref 8.9–10.3)
Chloride: 103 mmol/L (ref 98–111)
Creatinine, Ser: 1.27 mg/dL — ABNORMAL HIGH (ref 0.44–1.00)
GFR, Estimated: 43 mL/min — ABNORMAL LOW (ref >60)
Glucose, Bld: 111 mg/dL — ABNORMAL HIGH (ref 70–99)
Potassium: 3.9 mmol/L (ref 3.5–5.1)
Sodium: 129 mmol/L — ABNORMAL LOW (ref 135–145)
Total Bilirubin: 0.8 mg/dL (ref 0.0–1.2)
Total Protein: 4.6 g/dL — ABNORMAL LOW (ref 6.5–8.1)

## 2023-10-22 LAB — CBC WITH DIFFERENTIAL/PLATELET
Abs Immature Granulocytes: 0.77 10*3/uL — ABNORMAL HIGH (ref 0.00–0.07)
Basophils Absolute: 0.1 10*3/uL (ref 0.0–0.1)
Basophils Relative: 0 %
Eosinophils Absolute: 0.9 10*3/uL — ABNORMAL HIGH (ref 0.0–0.5)
Eosinophils Relative: 7 %
HCT: 22.9 % — ABNORMAL LOW (ref 36.0–46.0)
Hemoglobin: 7.8 g/dL — ABNORMAL LOW (ref 12.0–15.0)
Immature Granulocytes: 5 %
Lymphocytes Relative: 15 %
Lymphs Abs: 2.1 10*3/uL (ref 0.7–4.0)
MCH: 30.1 pg (ref 26.0–34.0)
MCHC: 34.1 g/dL (ref 30.0–36.0)
MCV: 88.4 fL (ref 80.0–100.0)
Monocytes Absolute: 0.8 10*3/uL (ref 0.1–1.0)
Monocytes Relative: 6 %
Neutro Abs: 9.9 10*3/uL — ABNORMAL HIGH (ref 1.7–7.7)
Neutrophils Relative %: 67 %
Platelets: 105 10*3/uL — ABNORMAL LOW (ref 150–400)
RBC: 2.59 MIL/uL — ABNORMAL LOW (ref 3.87–5.11)
RDW: 17.3 % — ABNORMAL HIGH (ref 11.5–15.5)
WBC: 14.6 10*3/uL — ABNORMAL HIGH (ref 4.0–10.5)
nRBC: 1.8 % — ABNORMAL HIGH (ref 0.0–0.2)

## 2023-10-22 LAB — HEMOGLOBIN AND HEMATOCRIT, BLOOD
HCT: 22.3 % — ABNORMAL LOW (ref 36.0–46.0)
HCT: 32.8 % — ABNORMAL LOW (ref 36.0–46.0)
Hemoglobin: 7.8 g/dL — ABNORMAL LOW (ref 12.0–15.0)
Hemoglobin: 11.4 g/dL — ABNORMAL LOW (ref 12.0–15.0)

## 2023-10-22 LAB — GLUCOSE, CAPILLARY
Glucose-Capillary: 129 mg/dL — ABNORMAL HIGH (ref 70–99)
Glucose-Capillary: 139 mg/dL — ABNORMAL HIGH (ref 70–99)
Glucose-Capillary: 132 mg/dL — ABNORMAL HIGH (ref 70–99)
Glucose-Capillary: 124 mg/dL — ABNORMAL HIGH (ref 70–99)

## 2023-10-22 LAB — PROTIME-INR
INR: 1 (ref 0.8–1.2)
Prothrombin Time: 13.5 s (ref 11.4–15.2)

## 2023-10-22 LAB — PREPARE RBC (CROSSMATCH)

## 2023-10-22 MED ORDER — NOREPINEPHRINE 4 MG/250ML-% IV SOLN
0.0000 ug/min | INTRAVENOUS | Status: DC
  Administered 2023-10-22: 2 ug/min via INTRAVENOUS
  Filled 2023-10-22: qty 250

## 2023-10-22 MED ORDER — TRAVASOL 10 % IV SOLN: INTRAVENOUS | Status: DC

## 2023-10-22 MED ORDER — FUROSEMIDE 10 MG/ML IJ SOLN: 20.0000 mg | Freq: Once | INTRAMUSCULAR | Status: AC

## 2023-10-22 MED ORDER — FUROSEMIDE 10 MG/ML IJ SOLN
INTRAMUSCULAR | Status: AC
  Administered 2023-10-22: 20 mg via INTRAVENOUS
  Filled 2023-10-22: qty 2

## 2023-10-22 MED ORDER — TRAVASOL 10 % IV SOLN
INTRAVENOUS | Status: AC
  Filled 2023-10-22: qty 900

## 2023-10-22 MED ORDER — SODIUM CHLORIDE 0.9% IV SOLUTION: Freq: Once | INTRAVENOUS | Status: AC

## 2023-10-22 MED ORDER — TRAVASOL 10 % IV SOLN
INTRAVENOUS | Status: DC
  Filled 2023-10-22: qty 900

## 2023-10-22 NOTE — Progress Notes (Signed)
 Physical Therapy Treatment Patient Details Name: Zahria Ding MRN: [id number] DOB: May 05, 1946 Today's Date: 10/22/2023   History of Present Illness Kaysie Michelini is a 78 y.o. 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     Workup in the ED demonstrating elevated WBCs and abnormal CT scan with concern for enteral fistula formation, contained abscess and diverticulitis.  Unfortunately given location of this new contained abscess no ability or safe margin for placement percutaneous drain.     Case was discussed with general surgery who recommended admission for IV antibiotics and anticipated surgical intervention on 10/10/2023.    PT Comments  Patient demonstrates slow labored movement for sitting up at bedside, fair/good return for completing BLE ROM/strengthening exercises while seated at bedside and limited to a few steps forward/backwards before having to sit due to fatigue and BLE weakness. Patient tolerated sitting up in chair after therapy. Patient will benefit from continued skilled physical therapy in hospital and recommended venue below to increase strength, balance, endurance for safe ADLs and gait.         If plan is discharge home, recommend the following: A lot of help with bathing/dressing/bathroom;A lot of help with walking and/or transfers;Help with stairs or ramp for entrance;Assistance with cooking/housework   Can travel by private vehicle     No  Equipment  Recommendations  None recommended by PT    Recommendations for Other Services       Precautions / Restrictions Precautions Precautions: Fall Recall of Precautions/Restrictions: Intact Restrictions Weight Bearing Restrictions Per Provider Order: No     Mobility  Bed Mobility Overal bed mobility: Needs Assistance Bed Mobility: Supine to Sit     Supine to sit: Mod assist     General bed mobility comments: slow labored movement    Transfers Overall transfer level: Needs assistance Equipment used: Rolling walker (2 wheels) Transfers: Sit to/from Stand, Bed to chair/wheelchair/BSC Sit to Stand: Min assist   Step pivot transfers: Min assist, Mod assist       General transfer comment: increased time, labored movment    Ambulation/Gait Ambulation/Gait assistance: Mod assist Gait Distance (Feet): 15 Feet Assistive device: Rolling walker (2 wheels) Gait Pattern/deviations: Decreased step length - right, Decreased step length - left, Decreased stride length Gait velocity: slow     General Gait Details: limited to a few slow labored steps forward/backwards before having to sit due to c/o fatigue, BLE weakness   Stairs             Wheelchair Mobility     Tilt Bed    Modified Rankin (Stroke Patients Only)       Balance Overall balance assessment: Needs assistance Sitting-balance support: Feet supported, No upper extremity supported Sitting balance-Leahy Scale: Fair Sitting balance - Comments: fair/good seated at EOB   Standing balance support: During functional activity, Bilateral upper extremity supported, Reliant on assistive device for balance Standing balance-Leahy Scale: Poor Standing balance comment: fair/poor using RW                            Communication Communication Communication: No apparent  difficulties  Cognition Arousal: Alert Behavior During Therapy: WFL for tasks assessed/performed   PT - Cognitive impairments: No  apparent impairments                         Following commands: Intact      Cueing Cueing Techniques: Verbal cues  Exercises General Exercises - Lower Extremity Long Arc Quad: Seated, AROM, Strengthening, Both, 10 reps Hip Flexion/Marching: Seated, AROM, Strengthening, Both, 10 reps Toe Raises: Seated, AROM, Strengthening, Both, 10 reps Heel Raises: Seated, AROM, Strengthening, Both, 10 reps    General Comments        Pertinent Vitals/Pain Pain Assessment Pain Assessment: Faces Faces Pain Scale: Hurts a little bit Pain Location: stomach at site of surgery Pain Descriptors / Indicators: Discomfort, Sore Pain Intervention(s): Limited activity within patient's tolerance, Monitored during session, Repositioned    Home Living                          Prior Function            PT Goals (current goals can now be found in the care plan section) Acute Rehab PT Goals Patient Stated Goal: return home after rehab PT Goal Formulation: With patient/family Time For Goal Achievement: 10/28/23 Potential to Achieve Goals: Good Progress towards PT goals: Progressing toward goals    Frequency    Min 3X/week      PT Plan      Co-evaluation              AM-PAC PT "6 Clicks" Mobility   Outcome Measure  Help needed turning from your back to your side while in a flat bed without using bedrails?: A Lot Help needed moving from lying on your back to sitting on the side of a flat bed without using bedrails?: A Lot Help needed moving to and from a bed to a chair (including a wheelchair)?: A Lot Help needed standing up from a chair using your arms (e.g., wheelchair or bedside chair)?: A Little Help needed to walk in hospital room?: A Lot Help needed climbing 3-5 steps with a railing? : A Lot 6 Click Score: 13    End of Session   Activity Tolerance: Patient tolerated treatment well;Patient limited by fatigue Patient left: in chair;with call bell/phone  within reach Nurse Communication: Mobility status PT Visit Diagnosis: Unsteadiness on feet (R26.81);Other abnormalities of gait and mobility (R26.89);Muscle weakness (generalized) (M62.81)     Time: 4034-7425 PT Time Calculation (min) (ACUTE ONLY): 19 min  Charges:    $Therapeutic Exercise: 8-22 mins $Therapeutic Activity: 8-22 mins PT General Charges $$ ACUTE PT VISIT: 1 Visit                     1:44 PM, 10/22/23 Walton Guppy, MPT Physical Therapist with [facility] 336 [phone] office [phone] mobile phone

## 2023-10-22 NOTE — Plan of Care (Signed)
  Problem: Education: Goal: Knowledge of General Education information will improve Description: Including pain rating scale, medication(s)/side effects and non-pharmacologic comfort measures Outcome: Progressing   Problem: Health Behavior/Discharge Planning: Goal: Ability to manage health-related needs will improve Outcome: Progressing   Problem: Clinical Measurements: Goal: Ability to maintain clinical measurements within normal limits will improve Outcome: Progressing Goal: Will remain free from infection Outcome: Progressing Goal: Diagnostic test results will improve Outcome: Progressing Goal: Respiratory complications will improve Outcome: Progressing Goal: Cardiovascular complication will be avoided Outcome: Progressing   Problem: Activity: Goal: Risk for activity intolerance will decrease Outcome: Progressing   Problem: Nutrition: Goal: Adequate nutrition will be maintained Outcome: Not Progressing   Problem: Coping: Goal: Level of anxiety will decrease Outcome: Progressing   Problem: Elimination: Goal: Will not experience complications related to bowel motility Outcome: Progressing Goal: Will not experience complications related to urinary retention Outcome: Progressing   Problem: Pain Managment: Goal: General experience of comfort will improve and/or be controlled Outcome: Progressing   Problem: Safety: Goal: Ability to remain free from injury will improve Outcome: Progressing   Problem: Skin Integrity: Goal: Risk for impaired skin integrity will decrease Outcome: Progressing

## 2023-10-22 NOTE — Progress Notes (Addendum)
 PHARMACY - TOTAL PARENTERAL NUTRITION CONSULT NOTE   Indication:  S/p partial colectomy, ileocecectomy, partial resection.  Patient Measurements: Height: 5\' 4"  (162.6 cm) Weight: 89.7 kg (197 lb 12 oz) IBW/kg (Calculated) : 54.7 TPN AdjBW (KG): 62.4 Body mass index is 33.94 kg/m. Usual Weight: Unknown  Assessment: 78 year old female hx Afib, CKD, obesity admitted with perforated sigmoid diverticulitis. Partial colectomy with end colostomy, ileocecectomy, partial small bowel resection. At risk for refeeding d/t minimal intake since admission. Patient having bleeding issues, heparin  infusion held. Plan to advance to clear liquid diet and remove NG tube and continue TPN until tolerating solid food.   Glucose / Insulin : No prior Hx DM, CBGs [phone]. 4 units given in past 24 hours  Electrolytes:   Na 132 phos 1.6 > 3.4 K 4.5> 5.1> 3.9  Renal: Hx CKD, Scr 1.1 Hepatic: WNL  GI Surgeries / Procedures:  5/15 partial colectomy/small bowel resection 5/23 EGD: non-bleeding gastric ulcer  Central access: Double lumen PICC since 5/11 TPN start date: 5/16  Nutritional Goals: Goal TPN rate is 75 ml/hr over 24 hours (provides 90 g of protein and 1800 kcals per day)   RD Assessment: Estimated Needs Total Energy Estimated Needs: 1700-1900 Total Protein Estimated Needs: 90-110 gm Total Fluid Estimated Needs: 1.7-1.9 L  Current Nutrition:  NPO. 100% nutritional needs from TPN  Plan:  Continue TPN  at 75 mL/hr  Electrolytes in TPN: Na 90 mEq/L, K 40 mEq/L, Ca 4mEq/L, Mg 10 mEq/L, and Phos 50mmol/L.  Cl:Ac =>maximize acetate Add standard MVI and trace elements to TPN Add Thiamine 100mg  to TPN Continue Moderate q8h SSI and adjust as needed  Monitor TPN labs on Mon/Thurs, F/U BMET in AM  Lora Robinsons, BS Pharm D, BCPS Clinical Pharmacist 10/22/2023 9:28 AM

## 2023-10-22 NOTE — Progress Notes (Signed)
 Patient with persistent hypotension, placed on norepinephrine infusion.

## 2023-10-22 NOTE — Anesthesia Postprocedure Evaluation (Signed)
 Anesthesia Post Note  Patient: Felicia Frank  Procedure(s) Performed: EGD (ESOPHAGOGASTRODUODENOSCOPY)  Patient location during evaluation: PACU Anesthesia Type: General Level of consciousness: awake and alert Pain management: pain level controlled Vital Signs Assessment: post-procedure vital signs reviewed and stable Respiratory status: spontaneous breathing, nonlabored ventilation, respiratory function stable and patient connected to nasal cannula oxygen Cardiovascular status: blood pressure returned to baseline and stable Postop Assessment: no apparent nausea or vomiting Anesthetic complications: no  No notable events documented.   Last Vitals:  Vitals:   10/22/23 0815 10/22/23 0909  BP: 120/60   Pulse: (!) 116   Resp: 15   Temp:  36.4 C  SpO2: 97%     Last Pain:  Vitals:   10/22/23 0955  TempSrc:   PainSc: 7                  Beacher Limerick

## 2023-10-22 NOTE — Plan of Care (Signed)
°  Problem: Clinical Measurements: Goal: Cardiovascular complication will be avoided Outcome: Not Progressing   Problem: Activity: Goal: Risk for activity intolerance will decrease Outcome: Not Progressing   Problem: Nutrition: Goal: Adequate nutrition will be maintained Outcome: Not Progressing

## 2023-10-22 NOTE — Progress Notes (Signed)
 PROGRESS NOTE   Felicia Frank  ZOX:[id number] DOB: [birth year] DOA: 10/07/2023 PCP: Allana Ishikawa, MD   Chief Complaint  Patient presents with   Abdominal Pain   Level of care: ICU  Brief Admission History:  78 y.o. female with medical history significant of hypothyroidism, hypertension, chronic kidney disease stage IIIa, class I obesity, atrial fibrillation and recent hospitalization secondary to diverticulitis with perforation and contained abscess status post drain placement and enteral fistula formation; who presented to the hospital secondary to ongoing abdominal pain, associated nausea/intermittent vomiting and difficulty keeping things down.  Patient has been admitted with diverticulitis of the colon with perforation and enterocolonic fistula.  She is status post Hartman's procedure with small bowel resection and ileocecectomy on 5/15.  Patient remains on TPN and Lasix  and continues to have some elevated heart rates requiring recurrent amiodarone  bolus and ongoing drip as well as IV metoprolol .  Surgery team following and Cardiology consulted for assistance in management as patient remains on IV amiodarone  infusion.      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 - Completed IV antibiotics with Zosyn  for 5 days postoperatively thru 5/21 - Appreciate general surgery recommendations to advance to clear liquid diet and remove NG tube and continue TPN until tolerating solid food. - Started on Lasix  5/16 and -5.1 L fluid balance noted in the last 24 hours with stable creatinine levels with -7.8 L fluid balance since initiation of  Lasix , repeat lasix  IV 20 mg q6 h x 3 doses on 5/23.  -Discontinued Foley catheter and monitor voiding output   Rare Candida species seen in intraabdominal deep wound culture - pharmacist consulted to start antifungal IV therapy - micafungin IV ordered   Leukocytosis - WBC trending down  - added antifungal coverage on 5/23  Severe hypoalbuminemia with 3rd spacing  - IV albumin  25 gram every 6 hours x 3 doses ordered 5/23  - due to severe edema in upper arms, venous doppler US  negative for DVT   Chronic paroxysmal atrial fibrillation with RVR-controlled - Will continue the use of adjusted dose IV metoprolol  and SR amiodarone  drip with loading dose repeated on 5/19 -Consult to cardiology for further assistance in management - IV heparin  stopped on 10/19/23 due to drop in Hg - continue IV amiodarone  infusion, cardiology rebolused on 5/20 and increased rate to 60 ml/hr, rebolused on 5/21, now back down to 30 mg/hr as heart rates have improved.    Acute hematemesis - resolved now  - pt reported vomited large amounts of black vomitus (500 mL) overnight; hg down to 6.4. CT angio GI bleed study negative for GI bleeding.  Intraluminal blood product noted within the small bowel in the region of the small bowel anastomosis resulting in a partial SBO.   - Pt is being treated supportively  - transfused 2 unit PRBC on 5/22, repeat transfusion of 2 units PRBC on 5/23, another 2 units PRBC ordered 5/24    Acute blood loss anemia-Hg declined to 6.4 after restarting IV heparin  infusion - Status post 2 unit PRBC transfusion 5/17  - s/p 1 unit PRBC on 5/21, 3 units PRBC ordered on 5/22 - Hg back down to 6.4 on 5/23,  transfused 2 units PRBC, Hg down to 7.8, transfuse 2 additional units PRBC on 5/24 - EGD done 5/23 to check for upper GI bleeding - nonbleeding gastric ulcer found no source of bleeding seen.    essential hypertension - now hypotensive on pressor support in ICU    hypothyroidism - Continue  Synthroid . - Taking p.o.   acute kidney injury in the setting of chronic kidney disease stage IIIa - Creatinine improving with diuresis - Continue to monitor closely   GERD - Continue PPI   compression fracture of L1 - Continue as needed analgesia - Will recommend continued use of TLSO brace at discharge.   class I obesity -Body mass index is 32.28 kg/m. - Low-calorie diet and portion control discussed with patient.   DVT prophylaxis: IV heparin  was resumed 5/19, stopped on 5/21 due to drop in Hg, SCDs Code Status: Full  Family Communication: bedside update 5/22 Disposition: anticipating SNF ([facility])    Consultants:  Surgery Cardiology  Procedures:   Antimicrobials:    Subjective: Remains weak, pale, maroon colored blood in ostomy bag    Objective: Vitals:   10/22/23 0815 10/22/23 0909 10/22/23 1221 10/22/23 1242  BP: 120/60  (!) 125/44 115/76  Pulse: (!) 116  62 63  Resp: 15  14 16   Temp:  97.6 F (36.4 C) 97.6 F (36.4 C) 97.6 F (36.4 C)  TempSrc:  Oral Oral   SpO2: 97%  99%   Weight:      Height:        Intake/Output Summary (Last 24 hours) at 10/22/2023 1304 Last data filed at 10/22/2023 1232 Gross per 24 hour  Intake 3598.2 ml  Output 2237.5 ml  Net 1360.7 ml   Filed Weights   10/20/23 0500 10/21/23 0517 10/22/23 0500  Weight: 85.8 kg 92.5 kg 89.7 kg   Examination:  General exam: Appears weak pale and somnolent, but NAD, cooperative.   Respiratory system: Clear to auscultation. Respiratory effort normal. Cardiovascular system: irregularly irregular normal S1 & S2 heard. trace pedal edema. Gastrointestinal system: Abdomen is nondistended, soft and nontender. Ostomy with black output seen. Less amount of melanotic output from ostomy seen. Central nervous system: Alert and oriented. No focal neurological deficits. Extremities: 2++ edema pitting Bilateral upper extremities.  Symmetric 5 x 5 power. Skin: No rashes, lesions or  ulcers. Psychiatry: Judgement and insight appear normal. Mood & affect appropriate.   Data Reviewed: I have personally reviewed following labs and imaging studies  CBC: Recent Labs  Lab 10/19/23 0134 10/19/23 1140 10/19/23 1458 10/20/23 0425 10/20/23 1732 10/21/23 0450 10/21/23 1703 10/22/23 0311 10/22/23 0836  WBC 9.2 10.4  --  11.4*  --  18.9*  --  14.6*  --   NEUTROABS  --   --   --   --   --   --   --  9.9*  --   HGB 6.4* 7.4*   < > 6.6* 7.5* 6.4* 9.2* 7.8* 7.8*  HCT 20.3* 21.4*   < > 18.9* 20.6* 19.5* 26.4* 22.9* 22.3*  MCV 94.9 90.3  --  92.2  --  93.8  --  88.4  --   PLT 146* 127*  --  125*  --  141*  --  105*  --    < > = values in this interval not displayed.    Basic Metabolic Panel: Recent Labs  Lab 10/17/23 0407 10/18/23 0951 10/19/23 0016 10/20/23 0425 10/21/23 0450 10/22/23 0311  NA 132* 129* 135 132* 129*  129*  K 3.8 4.3 4.7 4.5 5.1 3.9  CL 96* 93* 99 104 105 103  CO2 32 28 25 22  19* 20*  GLUCOSE 123* 119* 170* 138* 143* 111*  BUN 33* 40* 50* 55* 60* 61*  CREATININE 1.36* 1.21* 1.17* 1.10* 1.24* 1.27*  CALCIUM 8.0* 8.2* 7.7* 7.3* 7.5* 8.1*  MG 1.9 1.7 1.6* 1.9 1.9  --   PHOS 2.4*  --  1.6* 3.4 3.6  --     CBG: Recent Labs  Lab 10/21/23 1306 10/21/23 1434 10/21/23 2025 10/22/23 0550 10/22/23 1141  GLUCAP 134* 141* 106* 129* 139*    Recent Results (from the past 240 hours)  Aerobic/Anaerobic Culture w Gram Stain (surgical/deep wound)     Status: None   Collection Time: 10/13/23  3:58 PM   Specimen: Path fluid; GI  Result Value Ref Range Status   Specimen Description ABSCESS  Final   Special Requests INTRAABDOMINAL  Final   Gram Stain   Final    FEW WBC PRESENT, PREDOMINANTLY PMN NO ORGANISMS SEEN    Culture   Final    RARE CANDIDA ALBICANS NO ANAEROBES ISOLATED Performed at [facility], [address], [place], [place] [address]    Report Status 10/18/2023 FINAL  Final     Radiology Studies: US  Venous Img Upper Bilat  (DVT) Result Date: 10/21/2023 CLINICAL DATA:  Bilateral arm pain EXAM: BILATERAL UPPER EXTREMITY VENOUS DOPPLER ULTRASOUND TECHNIQUE: Gray-scale sonography with graded compression, as well as color Doppler and duplex ultrasound were performed to evaluate the bilateral upper extremity deep venous systems from the level of the subclavian vein and including the jugular, axillary, basilic, radial, ulnar and upper cephalic vein. Spectral Doppler was utilized to evaluate flow at rest and with distal augmentation maneuvers. COMPARISON:  None Available. FINDINGS: RIGHT UPPER EXTREMITY Internal Jugular Vein: No evidence of thrombus. Normal compressibility, respiratory phasicity and response to augmentation. Subclavian Vein: No evidence of thrombus. Normal compressibility, respiratory phasicity and response to augmentation. Axillary Vein: No evidence of thrombus. Normal compressibility, respiratory phasicity and response to augmentation. Cephalic Vein: No evidence of thrombus. Normal compressibility, respiratory phasicity and response to augmentation. Basilic Vein: Poorly seen with overlapping bandages and soft tissue. Brachial Veins: No evidence of thrombus. Normal compressibility, respiratory phasicity and response to augmentation. Radial Veins: Incompletely evaluated due to overlapping bandages. Ulnar Veins: Poorly seen with overlapping bandages. Venous Reflux:  None. Other Findings:  None. LEFT UPPER EXTREMITY Internal Jugular Vein: No evidence of thrombus. Normal compressibility, respiratory phasicity and response to augmentation. Subclavian Vein: No evidence of thrombus. Normal compressibility, respiratory phasicity and response to augmentation. Axillary Vein: No evidence of thrombus. Normal compressibility, respiratory phasicity and response to augmentation. Cephalic Vein: No evidence of thrombus. Normal compressibility, respiratory phasicity and response to augmentation. Basilic Vein: No evidence of thrombus. Normal  compressibility, respiratory phasicity and response to augmentation. Brachial Veins: No evidence of thrombus. Normal compressibility, respiratory phasicity and response to augmentation. Radial Veins: No evidence of thrombus. Normal compressibility, respiratory phasicity and response to augmentation. Ulnar Veins: No evidence of thrombus. Normal compressibility, respiratory phasicity and response to augmentation. Venous Reflux:  None. Other Findings:  None. IMPRESSION: No evidence of DVT within either upper extremity. Some limited evaluation of the right basilic vein and right radial and ulnar veins. Electronically Signed   By: Adrianna Horde M.D.   On: 10/21/2023 12:58     Scheduled Meds:  sodium chloride    Intravenous Once   acetaminophen   1,000 mg Oral Q6H  Chlorhexidine  Gluconate Cloth  6 each Topical Daily   insulin  aspart  0-15 Units Subcutaneous Q8H   levothyroxine   50 mcg Oral Daily   metoprolol  tartrate  5 mg Intravenous Q6H   pantoprazole  (PROTONIX ) IV  40 mg Intravenous Q12H   sodium chloride  flush  10-40 mL Intracatheter Q12H   Continuous Infusions:  amiodarone  30 mg/hr (10/22/23 1232)   micafungin (MYCAMINE) 100 mg in sodium chloride  0.9 % 100 mL IVPB Stopped (10/22/23 1222)   norepinephrine (LEVOPHED) Adult infusion Stopped (10/22/23 1120)   TPN ADULT (ION) 75 mL/hr at 10/22/23 1232   TPN ADULT (ION)       LOS: 15 days   Critical Care Procedure Note Authorized and Performed by: Olga Berthold MD  Total Critical Care time:  55 mins Due to a high probability of clinically significant, life threatening deterioration, the patient required my highest level of preparedness to intervene emergently and I personally spent this critical care time directly and personally managing the patient.  This critical care time included obtaining a history; examining the patient, pulse oximetry; ordering and review of studies; arranging urgent treatment with development of a management plan; evaluation  of patient's response of treatment; frequent reassessment; and discussions with other providers.  This critical care time was performed to assess and manage the high probability of imminent and life threatening deterioration that could result in multi-organ failure.  It was exclusive of separately billable procedures and treating other patients and teaching time.    Faustino Hook, MD How to contact the [facility] Attending or Consulting provider 7A - 7P or covering provider during after hours 7P -7A, for this patient?  Check the care team in [facility] and look for a) attending/consulting [facility] provider listed and b) the [facility] team listed Log into [email or web address] to find provider on call.  Locate the [facility] provider you are looking for under [facility] and page to a number that you can be directly reached. If you still have difficulty reaching the provider, please page the [facility] (Director on Call) for the Hospitalists listed on amion for assistance.  10/22/2023, 1:04 PM

## 2023-10-22 NOTE — Progress Notes (Signed)
 [facility]  Patient H&H drifted more this AM. I reordered H&H and INR stat. H&H is stable from 830am to 330am but down to 7.8 from peak of 9.2 yesterday after blood. INR normal.  Started on levo for hypotension, likely relate to blood loss.    BP 120/60   Pulse (!) 116   Temp 97.6 F (36.4 C) (Oral)   Resp 15   Ht 5\' 4"  (1.626 m)   Wt 89.7 kg   SpO2 97%   BMI 33.94 kg/m  Seems down/ sad  Talking minimally Dark blood in bag  Abdomen soft, staple line c/d/I with honeycomb in place JP SS Output  Patient s/p hartman's, end colostomy, ileocecetomy and SBR for perforated diverticulitis.   Still requiring blood H&H drifting again EGD with ulcer but this was not bleeding More blood in bag Discussed with patient and team that this is usually self limited and does not require operative revision. Discussed limitation of any endoscopic revision due to location presumed in the SB anastomosis.  Full liquid diet, would not do anything more with active bleeding, risk of knocking off clot from staple line and ileus   Getting 2U today. Hold blood thinners  Micafungin  for the candida intraabdominal   Deena Farrier, MD [facility] [address] [place], [place] [address] [phone] (office)

## 2023-10-22 NOTE — Progress Notes (Deleted)
   10/22/23 1726  TOC Discharge Assessment  Final next level of care Skilled Nursing Facility  Patient states their goals for this hospitalization and ongoing recovery are: Patient agrees to go to  SNF   CM met will patient at bedside. Patient reports she will accepted choice at SNF pending bed availability.

## 2023-10-23 DIAGNOSIS — D62 Acute posthemorrhagic anemia: Secondary | ICD-10-CM | POA: Diagnosis not present

## 2023-10-23 DIAGNOSIS — E876 Hypokalemia: Secondary | ICD-10-CM | POA: Diagnosis not present

## 2023-10-23 DIAGNOSIS — K5720 Diverticulitis of large intestine with perforation and abscess without bleeding: Secondary | ICD-10-CM | POA: Diagnosis not present

## 2023-10-23 LAB — BPAM RBC
Blood Product Expiration Date: 202506172359
Blood Product Expiration Date: 202506242359
ISSUE DATE / TIME: 202505241216
ISSUE DATE / TIME: 202505241423
Unit Type and Rh: 5100
Unit Type and Rh: 5100

## 2023-10-23 LAB — CBC WITH DIFFERENTIAL/PLATELET
Abs Immature Granulocytes: 0.33 10*3/uL — ABNORMAL HIGH (ref 0.00–0.07)
Basophils Absolute: 0 10*3/uL (ref 0.0–0.1)
Basophils Relative: 1 %
Eosinophils Absolute: 0.1 10*3/uL (ref 0.0–0.5)
Eosinophils Relative: 1 %
HCT: 31.5 % — ABNORMAL LOW (ref 36.0–46.0)
Hemoglobin: 11.2 g/dL — ABNORMAL LOW (ref 12.0–15.0)
Immature Granulocytes: 5 %
Lymphocytes Relative: 11 %
Lymphs Abs: 0.7 10*3/uL (ref 0.7–4.0)
MCH: 30.7 pg (ref 26.0–34.0)
MCHC: 35.6 g/dL (ref 30.0–36.0)
MCV: 86.3 fL (ref 80.0–100.0)
Monocytes Absolute: 0.3 10*3/uL (ref 0.1–1.0)
Monocytes Relative: 5 %
Neutro Abs: 4.9 10*3/uL (ref 1.7–7.7)
Neutrophils Relative %: 77 %
Platelets: 108 10*3/uL — ABNORMAL LOW (ref 150–400)
RBC: 3.65 MIL/uL — ABNORMAL LOW (ref 3.87–5.11)
RDW: 16.9 % — ABNORMAL HIGH (ref 11.5–15.5)
WBC: 6.4 10*3/uL (ref 4.0–10.5)
nRBC: 1.3 % — ABNORMAL HIGH (ref 0.0–0.2)

## 2023-10-23 LAB — COMPREHENSIVE METABOLIC PANEL WITH GFR
ALT: 46 U/L — ABNORMAL HIGH (ref 0–44)
AST: 34 U/L (ref 15–41)
Albumin: 2.6 g/dL — ABNORMAL LOW (ref 3.5–5.0)
Alkaline Phosphatase: 51 U/L (ref 38–126)
Anion gap: 15 (ref 5–15)
BUN: 61 mg/dL — ABNORMAL HIGH (ref 8–23)
CO2: 21 mmol/L — ABNORMAL LOW (ref 22–32)
Calcium: 8.1 mg/dL — ABNORMAL LOW (ref 8.9–10.3)
Chloride: 99 mmol/L (ref 98–111)
Creatinine, Ser: 1.12 mg/dL — ABNORMAL HIGH (ref 0.44–1.00)
GFR, Estimated: 50 mL/min — ABNORMAL LOW (ref >60)
Glucose, Bld: 98 mg/dL (ref 70–99)
Potassium: 3.2 mmol/L — ABNORMAL LOW (ref 3.5–5.1)
Sodium: 135 mmol/L (ref 135–145)
Total Bilirubin: 0.8 mg/dL (ref 0.0–1.2)
Total Protein: 4.4 g/dL — ABNORMAL LOW (ref 6.5–8.1)

## 2023-10-23 LAB — TYPE AND SCREEN
ABO/RH(D): A POS
Antibody Screen: NEGATIVE
Unit division: 0
Unit division: 0

## 2023-10-23 LAB — GLUCOSE, CAPILLARY
Glucose-Capillary: 119 mg/dL — ABNORMAL HIGH (ref 70–99)
Glucose-Capillary: 140 mg/dL — ABNORMAL HIGH (ref 70–99)
Glucose-Capillary: 118 mg/dL — ABNORMAL HIGH (ref 70–99)

## 2023-10-23 MED ORDER — METOPROLOL TARTRATE 50 MG PO TABS
100.0000 mg | ORAL_TABLET | Freq: Two times a day (BID) | ORAL | Status: DC
  Administered 2023-10-23 – 2023-10-27 (×8): 100 mg via ORAL
  Filled 2023-10-23 (×8): qty 2

## 2023-10-23 MED ORDER — POTASSIUM CHLORIDE CRYS ER 20 MEQ PO TBCR
40.0000 meq | EXTENDED_RELEASE_TABLET | Freq: Two times a day (BID) | ORAL | Status: DC
  Administered 2023-10-23 – 2023-10-25 (×6): 40 meq via ORAL
  Filled 2023-10-23 (×6): qty 2

## 2023-10-23 MED ORDER — METHOCARBAMOL 500 MG PO TABS
500.0000 mg | ORAL_TABLET | Freq: Three times a day (TID) | ORAL | Status: DC | PRN
  Administered 2023-10-23 – 2023-10-26 (×2): 500 mg via ORAL
  Filled 2023-10-23 (×2): qty 1

## 2023-10-23 MED ORDER — TRAVASOL 10 % IV SOLN
INTRAVENOUS | Status: DC
Start: 2023-10-23 — End: 2023-10-24
  Filled 2023-10-23: qty 900

## 2023-10-23 MED ORDER — FUROSEMIDE 10 MG/ML IJ SOLN
20.0000 mg | Freq: Three times a day (TID) | INTRAMUSCULAR | Status: DC
  Administered 2023-10-23 – 2023-10-26 (×9): 20 mg via INTRAVENOUS
  Filled 2023-10-23 (×9): qty 2

## 2023-10-23 MED ORDER — POTASSIUM CHLORIDE 10 MEQ/100ML IV SOLN: 10.0000 meq | INTRAVENOUS | Status: DC

## 2023-10-23 MED ORDER — TRAVASOL 10 % IV SOLN
INTRAVENOUS | Status: DC
  Filled 2023-10-23: qty 900

## 2023-10-23 MED ORDER — MELATONIN 3 MG PO TABS
6.0000 mg | ORAL_TABLET | Freq: Every evening | ORAL | Status: DC | PRN
  Administered 2023-10-23 – 2023-10-24 (×2): 6 mg via ORAL
  Filled 2023-10-23 (×2): qty 2

## 2023-10-23 MED ORDER — AMIODARONE HCL 200 MG PO TABS
200.0000 mg | ORAL_TABLET | Freq: Two times a day (BID) | ORAL | Status: DC
  Administered 2023-10-23 – 2023-10-27 (×9): 200 mg via ORAL
  Filled 2023-10-23 (×9): qty 1

## 2023-10-23 NOTE — Progress Notes (Signed)
 PROGRESS NOTE   Meggen Spaziani  UJW:[id number] DOB: 02-27-1946 DOA: 10/07/2023 PCP: Allana Ishikawa, MD   Chief Complaint  Patient presents with   Abdominal Pain   Level of care: ICU  Brief Admission History:  78 y.o. female with medical history significant of hypothyroidism, hypertension, chronic kidney disease stage IIIa, class I obesity, atrial fibrillation and recent hospitalization secondary to diverticulitis with perforation and contained abscess status post drain placement and enteral fistula formation; who presented to the hospital secondary to ongoing abdominal pain, associated nausea/intermittent vomiting and difficulty keeping things down.  Patient has been admitted with diverticulitis of the colon with perforation and enterocolonic fistula.  She is status post Hartman's procedure with small bowel resection and ileocecectomy on 5/15.  Patient remains on TPN and Lasix  and continues to have some elevated heart rates requiring recurrent amiodarone  bolus and ongoing drip as well as IV metoprolol .  Surgery team following and Cardiology consulted for assistance in management as patient remains on IV amiodarone  infusion.      Assessment and Plan:  Hemorrhagic Shock - RESOLVED  - pressors had to be given briefly due to persistent hypotension but now has been off pressors after PRBC transfusion given on 5/24 - remains off IV norepinephrine infusion - Hemoglobin has been stable now for last 12 hours which is reassuring  Diverticulitis of the colon with perforation and enterocolonic fistula status post Hartman's procedure with small bowel resection and ileocecectomy 5/15. - Completed IV antibiotics with Zosyn  for 5 days postoperatively thru 5/21 - Appreciate general surgery recommendations to advance to clear liquid diet and remove NG tube and continue TPN until tolerating solid food. - Started on Lasix  5/16 and -5.1 L fluid balance noted in the last 24 hours with stable creatinine  levels with -7.8 L fluid balance since initiation of Lasix , repeat lasix  IV 20 mg q6 h x 3 doses on 5/23.  -Discontinued Foley catheter and monitor voiding output   Rare Candida species seen in intraabdominal deep wound culture - pharmacist consulted to start antifungal IV therapy - micafungin IV ordered   Leukocytosis - WBC trending down  - added antifungal coverage on 5/23  Severe hypoalbuminemia with 3rd spacing  - IV albumin  25 gram every 6 hours x 3 doses ordered 5/23  - due to severe edema in upper arms, venous doppler US  negative for DVT  Filed Weights   10/21/23 0517 10/22/23 0500 10/23/23 0500  Weight: 92.5 kg 89.7 kg 91 kg   Intake/Output Summary (Last 24 hours) at 10/23/2023 1043 Last data filed at 10/23/2023 5621 Gross per 24 hour  Intake 3243.92 ml  Output 2950 ml  Net 293.92 ml   Chronic paroxysmal atrial fibrillation with RVR-controlled - Will continue the use of adjusted dose IV metoprolol  and SR amiodarone  drip with loading dose repeated on 5/19 -Consult to cardiology for further assistance in management - IV heparin  stopped on 10/19/23 due to drop in Hg - continue IV amiodarone  infusion, cardiology rebolused on 5/20 and increased rate to 60 ml/hr, rebolused on 5/21, now back down to 30 mg/hr as heart rates have improved.  -pt to continue IV amiodarone  until her oral intake is stable and we can transition to oral amiodarone  -she is eating better today and bleeding seems to be stopped so hopefully soon we can get her off the IV amiodarone  infusion and would plan on amiodarone  200 mg BID, metoprolol  100 mg BID (total 200 mg/d)   Acute hematemesis - resolved now  - pt reported  vomited large amounts of black vomitus (500 mL) overnight; hg down to 6.4. CT angio GI bleed study negative for GI bleeding.  Intraluminal blood product noted within the small bowel in the region of the small bowel anastomosis resulting in a partial SBO.   - Pt is being treated supportively  -  transfused 2 unit PRBC on 5/22, repeat transfusion of 2 units PRBC on 5/23, another 2 units PRBC ordered 5/24  - Hg stable for last 12 hours at 11.4 which is reassuring - recheck Hg in AM with CBC    Acute blood loss anemia-Hg declined to 6.4 after IV heparin  infusion started - now heparin  is discontinued - Status post 2 unit PRBC transfusion 5/17  - s/p 1 unit PRBC on 5/21, 3 units PRBC ordered on 5/22 - Hg back down to 6.4 on 5/23, transfused 2 units PRBC, Hg down to 7.8, transfuse 2 additional units PRBC on 5/24 - EGD done 5/23 to check for upper GI bleeding - nonbleeding gastric ulcer found no source of bleeding seen.  - Hg stabilized after latest transfusions on 5/24 at 11, plan to recheck CBC in AM    essential hypertension - now off pressor support, follow BP closely in ICU   hypothyroidism - Continue Synthroid . - Taking p.o. daily with no difficulty   acute kidney injury in the setting of chronic kidney disease stage IIIa - Creatinine improving with diuresis - Continue to monitor closely - morning labs still pending due to equipment failure in lab at [facility] today   GERD - Continue PPI   compression fracture of L1 - Continue as needed analgesia - Will recommend continued use of TLSO brace at discharge.   class I obesity -Body mass index is 32.28 kg/m. - Low-calorie diet and portion control discussed with patient.   DVT prophylaxis: IV heparin  was resumed 5/19, stopped on 5/21 due to drop in Hg, SCDs Code Status: Full  Family Communication: bedside update 5/22, son 5/25  Disposition: anticipating SNF ([facility]) later in week if recovery permits   Consultants:  Surgery Cardiology  Procedures:   Antimicrobials:    Subjective: Pt sitting up in chair today; she has been eating small amounts.      Objective: Vitals:   10/23/23 0600 10/23/23 0641 10/23/23 0744 10/23/23 0800  BP: (!) 130/55   (!) 157/69  Pulse: 69   68  Resp: (!) 31   (!) 21  Temp:  98 F (36.7  C) 97.9 F (36.6 C)   TempSrc:  Oral Oral   SpO2: 97%   98%  Weight:      Height:        Intake/Output Summary (Last 24 hours) at 10/23/2023 1039 Last data filed at 10/23/2023 0925 Gross per 24 hour  Intake 3243.92 ml  Output 2950 ml  Net 293.92 ml   Filed Weights   10/21/23 0517 10/22/23 0500 10/23/23 0500  Weight: 92.5 kg 89.7 kg 91 kg   Examination:  General exam: Appears weak pale and somnolent, but NAD, cooperative.   Respiratory system: Clear to auscultation. Respiratory effort normal. Cardiovascular system: irregularly irregular normal S1 & S2 heard. trace pedal edema. Gastrointestinal system: Abdomen is nondistended, soft and nontender. Ostomy with black output seen. Less amount of melanotic output from ostomy seen. Central nervous system: Alert and oriented. No focal neurological deficits. Extremities: 2++ edema pitting Bilateral upper extremities.  Symmetric 5 x 5 power. Skin: No rashes, lesions or ulcers. Psychiatry: Judgement and insight appear normal. Mood &  affect appropriate.   Data Reviewed: I have personally reviewed following labs and imaging studies  CBC: Recent Labs  Lab 10/19/23 1140 10/19/23 1458 10/20/23 0425 10/20/23 1732 10/21/23 0450 10/21/23 1703 10/22/23 0311 10/22/23 0836 10/22/23 1957 10/23/23 0324  WBC 10.4  --  11.4*  --  18.9*  --  14.6*  --   --  6.4  NEUTROABS  --   --   --   --   --   --  9.9*  --   --  4.9  HGB 7.4*   < > 6.6*   < > 6.4* 9.2* 7.8* 7.8* 11.4* 11.2*  HCT 21.4*   < > 18.9*   < > 19.5* 26.4* 22.9* 22.3* 32.8* 31.5*  MCV 90.3  --  92.2  --  93.8  --  88.4  --   --  86.3  PLT 127*  --  125*  --  141*  --  105*  --   --  108*   < > = values in this interval not displayed.    Basic Metabolic Panel: Recent Labs  Lab 10/17/23 0407 10/18/23 0951 10/19/23 0016 10/20/23 0425 10/21/23 0450 10/22/23 0311  NA 132* 129* 135 132* 129* 129*  K 3.8 4.3 4.7 4.5 5.1 3.9  CL 96* 93* 99 104 105 103  CO2 32 28 25 22  19* 20*   GLUCOSE 123* 119* 170* 138* 143* 111*  BUN 33* 40* 50* 55* 60* 61*  CREATININE 1.36* 1.21* 1.17* 1.10* 1.24* 1.27*  CALCIUM 8.0* 8.2* 7.7* 7.3* 7.5* 8.1*  MG 1.9 1.7 1.6* 1.9 1.9  --   PHOS 2.4*  --  1.6* 3.4 3.6  --     CBG: Recent Labs  Lab 10/22/23 0550 10/22/23 1141 10/22/23 1413 10/22/23 2040 10/23/23 0547  GLUCAP 129* 139* 132* 124* 119*    Recent Results (from the past 240 hours)  Aerobic/Anaerobic Culture w Gram Stain (surgical/deep wound)     Status: None   Collection Time: 10/13/23  3:58 PM   Specimen: Path fluid; GI  Result Value Ref Range Status   Specimen Description ABSCESS  Final   Special Requests INTRAABDOMINAL  Final   Gram Stain   Final    FEW WBC PRESENT, PREDOMINANTLY PMN NO ORGANISMS SEEN    Culture   Final    RARE CANDIDA ALBICANS NO ANAEROBES ISOLATED Performed at [facility], [address], [place], [place] [address]    Report Status 10/18/2023 FINAL  Final     Radiology Studies: US  Venous Img Upper Bilat (DVT) Result Date: 10/21/2023 CLINICAL DATA:  Bilateral arm pain EXAM: BILATERAL UPPER EXTREMITY VENOUS DOPPLER ULTRASOUND TECHNIQUE: Gray-scale sonography with graded compression, as well as color Doppler and duplex ultrasound were performed to evaluate the bilateral upper extremity deep venous systems from the level of the subclavian vein and including the jugular, axillary, basilic, radial, ulnar and upper cephalic vein. Spectral Doppler was utilized to evaluate flow at rest and with distal augmentation maneuvers. COMPARISON:  None Available. FINDINGS: RIGHT UPPER EXTREMITY Internal Jugular Vein: No evidence of thrombus. Normal compressibility, respiratory phasicity and response to augmentation. Subclavian Vein: No evidence of thrombus. Normal compressibility, respiratory phasicity and response to augmentation. Axillary Vein: No evidence of thrombus. Normal compressibility, respiratory phasicity and response to augmentation. Cephalic  Vein: No evidence of thrombus. Normal compressibility, respiratory phasicity and response to augmentation. Basilic Vein: Poorly seen with overlapping bandages and soft tissue. Brachial Veins: No evidence of thrombus. Normal compressibility,  respiratory phasicity and response to augmentation. Radial Veins: Incompletely evaluated due to overlapping bandages. Ulnar Veins: Poorly seen with overlapping bandages. Venous Reflux:  None. Other Findings:  None. LEFT UPPER EXTREMITY Internal Jugular Vein: No evidence of thrombus. Normal compressibility, respiratory phasicity and response to augmentation. Subclavian Vein: No evidence of thrombus. Normal compressibility, respiratory phasicity and response to augmentation. Axillary Vein: No evidence of thrombus. Normal compressibility, respiratory phasicity and response to augmentation. Cephalic Vein: No evidence of thrombus. Normal compressibility, respiratory phasicity and response to augmentation. Basilic Vein: No evidence of thrombus. Normal compressibility, respiratory phasicity and response to augmentation. Brachial Veins: No evidence of thrombus. Normal compressibility, respiratory phasicity and response to augmentation. Radial Veins: No evidence of thrombus. Normal compressibility, respiratory phasicity and response to augmentation. Ulnar Veins: No evidence of thrombus. Normal compressibility, respiratory phasicity and response to augmentation. Venous Reflux:  None. Other Findings:  None. IMPRESSION: No evidence of DVT within either upper extremity. Some limited evaluation of the right basilic vein and right radial and ulnar veins. Electronically Signed   By: Adrianna Horde M.D.   On: 10/21/2023 12:58   Scheduled Meds:  sodium chloride    Intravenous Once   acetaminophen   1,000 mg Oral Q6H   Chlorhexidine  Gluconate Cloth  6 each Topical Daily   insulin  aspart  0-15 Units Subcutaneous Q8H   levothyroxine   50 mcg Oral Daily   metoprolol  tartrate  5 mg Intravenous Q6H    pantoprazole  (PROTONIX ) IV  40 mg Intravenous Q12H   sodium chloride  flush  10-40 mL Intracatheter Q12H   Continuous Infusions:  amiodarone  30 mg/hr (10/23/23 0922)   micafungin (MYCAMINE) 100 mg in sodium chloride  0.9 % 100 mL IVPB Stopped (10/22/23 1222)   norepinephrine (LEVOPHED) Adult infusion Stopped (10/22/23 1120)   TPN ADULT (ION) 75 mL/hr at 10/23/23 0335    LOS: 16 days   Critical Care Procedure Note Authorized and Performed by: Olga Berthold MD  Total Critical Care time: 58 mins Due to a high probability of clinically significant, life threatening deterioration, the patient required my highest level of preparedness to intervene emergently and I personally spent this critical care time directly and personally managing the patient.  This critical care time included obtaining a history; examining the patient, pulse oximetry; ordering and review of studies; arranging urgent treatment with development of a management plan; evaluation of patient's response of treatment; frequent reassessment; and discussions with other providers.  This critical care time was performed to assess and manage the high probability of imminent and life threatening deterioration that could result in multi-organ failure.  It was exclusive of separately billable procedures and treating other patients and teaching time.    Faustino Hook, MD How to contact the [facility] Attending or Consulting provider 7A - 7P or covering provider during after hours 7P -7A, for this patient?  Check the care team in [facility] and look for a) attending/consulting [facility] provider listed and b) the [facility] team listed Log into [email or web address] to find provider on call.  Locate the [facility] provider you are looking for under [facility] and page to a number that you can be directly reached. If you still have difficulty reaching the provider, please page the [facility] (Director on Call) for the Hospitalists listed on amion for assistance.  10/23/2023, 10:39 AM

## 2023-10-23 NOTE — Plan of Care (Signed)

## 2023-10-23 NOTE — Progress Notes (Signed)
 [facility]  H&H stable after blood products. Up to the chair again. Old maroon blood in bag. Taking in more full liquids.  Talked with Landon Pinion and [facility] and patient for 25 Minutes about the patient, the surgery, the bleeding, the expectations for her moving and eating and what it would take for her to be discharged.  BP (!) 157/69   Pulse 68   Temp 97.9 F (36.6 C) (Oral)   Resp (!) 21   Ht 5\' 4"  (1.626 m)   Wt 91 kg   SpO2 98%   BMI 34.44 kg/m  Soft, ostomy with maroon liquid in bag Seems in better spirits today  Patient s/p hartman's, end colostomy, ileocecetomy and SBR for perforated diverticulitis.    Discussed with patient and family that this is usually self limited and does not require operative revision. Discussed limitation of any endoscopic revision due to location presumed in the SB anastomosis.  Full liquid diet, would hold on increasing until blood has resolved Hold anticoagulation   Dr. Larrie Po rounding tomorrow.  Deena Farrier, MD [facility] [address] [place], [place] [address] [phone] (office)

## 2023-10-23 NOTE — Progress Notes (Signed)
 PHARMACY - TOTAL PARENTERAL NUTRITION CONSULT NOTE   Indication:  S/p partial colectomy, ileocecectomy, partial resection.  Patient Measurements: Height: 5\' 4"  (162.6 cm) Weight: 91 kg (200 lb 9.9 oz) IBW/kg (Calculated) : 54.7 TPN AdjBW (KG): 62.4 Body mass index is 34.44 kg/m.  Assessment: 78 year old female hx Afib, CKD, obesity admitted with perforated sigmoid diverticulitis. Partial colectomy with end colostomy, ileocecectomy, partial small bowel resection. At risk for refeeding d/t minimal intake since admission. Patient having bleeding issues, heparin  infusion held. Plan to advance to clear liquid diet and remove NG tube and continue TPN until tolerating solid food.   Glucose / Insulin : No prior Hx DM, CBGs 119-14. 6 units given in past 24 hours  Electrolytes:   Na 132 phos 1.6 > 3.4 K 4.5> 5.1> 3.9 >3.2  Renal: Hx CKD, Scr 1.1 Hepatic: WNL  GI Surgeries / Procedures:  5/15 partial colectomy/small bowel resection 5/23 EGD: non-bleeding gastric ulcer  Central access: Double lumen PICC since 5/11 TPN start date: 5/16  Nutritional Goals: Goal TPN rate is 75 ml/hr over 24 hours (provides 90 g of protein and 1800 kcals per day)   RD Assessment: Estimated Needs Total Energy Estimated Needs: 1700-1900 Total Protein Estimated Needs: 90-110 gm Total Fluid Estimated Needs: 1.7-1.9 L  Current Nutrition:  NPO. 100% nutritional needs from TPN  Plan:  Continue TPN  at 75 mL/hr  Electrolytes in TPN: Na 90 mEq/L, K 50 mEq/L, Ca 75mEq/L, Mg 10 mEq/L, and Phos 16mmol/L.  Cl:Ac =>maximize acetate Add standard MVI and trace elements to TPN Add Thiamine 100mg  to TPN KCL 10meq IV x 4 runs Continue Moderate q8h SSI and adjust as needed  Monitor TPN labs on Mon/Thurs, F/U BMET in AM  Felicia Frank, BS Pharm D, BCPS Clinical Pharmacist 10/23/2023 11:00 AM

## 2023-10-24 DIAGNOSIS — K5720 Diverticulitis of large intestine with perforation and abscess without bleeding: Secondary | ICD-10-CM | POA: Diagnosis not present

## 2023-10-24 DIAGNOSIS — D62 Acute posthemorrhagic anemia: Secondary | ICD-10-CM | POA: Diagnosis not present

## 2023-10-24 DIAGNOSIS — E781 Pure hyperglyceridemia: Secondary | ICD-10-CM

## 2023-10-24 DIAGNOSIS — N179 Acute kidney failure, unspecified: Secondary | ICD-10-CM | POA: Diagnosis not present

## 2023-10-24 DIAGNOSIS — E876 Hypokalemia: Secondary | ICD-10-CM | POA: Diagnosis not present

## 2023-10-24 LAB — CBC WITH DIFFERENTIAL/PLATELET
Abs Immature Granulocytes: 0.08 10*3/uL — ABNORMAL HIGH (ref 0.00–0.07)
Basophils Absolute: 0 10*3/uL (ref 0.0–0.1)
Basophils Relative: 1 %
Eosinophils Absolute: 0.1 10*3/uL (ref 0.0–0.5)
Eosinophils Relative: 1 %
HCT: 35.7 % — ABNORMAL LOW (ref 36.0–46.0)
Hemoglobin: 11.9 g/dL — ABNORMAL LOW (ref 12.0–15.0)
Immature Granulocytes: 1 %
Lymphocytes Relative: 13 %
Lymphs Abs: 0.8 10*3/uL (ref 0.7–4.0)
MCH: 32.7 pg (ref 26.0–34.0)
MCHC: 33.3 g/dL (ref 30.0–36.0)
MCV: 98.1 fL (ref 80.0–100.0)
Monocytes Absolute: 0.4 10*3/uL (ref 0.1–1.0)
Monocytes Relative: 6 %
Neutro Abs: 4.9 10*3/uL (ref 1.7–7.7)
Neutrophils Relative %: 78 %
Platelets: 115 10*3/uL — ABNORMAL LOW (ref 150–400)
RBC: 3.64 MIL/uL — ABNORMAL LOW (ref 3.87–5.11)
RDW: 19.3 % — ABNORMAL HIGH (ref 11.5–15.5)
WBC: 6.3 10*3/uL (ref 4.0–10.5)
nRBC: 0.3 % — ABNORMAL HIGH (ref 0.0–0.2)

## 2023-10-24 LAB — COMPREHENSIVE METABOLIC PANEL WITH GFR
ALT: 52 U/L — ABNORMAL HIGH (ref 0–44)
AST: 34 U/L (ref 15–41)
Albumin: 2.6 g/dL — ABNORMAL LOW (ref 3.5–5.0)
Alkaline Phosphatase: 78 U/L (ref 38–126)
Anion gap: 7 (ref 5–15)
BUN: 54 mg/dL — ABNORMAL HIGH (ref 8–23)
CO2: 26 mmol/L (ref 22–32)
Calcium: 8.3 mg/dL — ABNORMAL LOW (ref 8.9–10.3)
Chloride: 98 mmol/L (ref 98–111)
Creatinine, Ser: 1.04 mg/dL — ABNORMAL HIGH (ref 0.44–1.00)
GFR, Estimated: 55 mL/min — ABNORMAL LOW (ref >60)
Glucose, Bld: 129 mg/dL — ABNORMAL HIGH (ref 70–99)
Potassium: 4.5 mmol/L (ref 3.5–5.1)
Sodium: 131 mmol/L — ABNORMAL LOW (ref 135–145)
Total Bilirubin: 0.5 mg/dL (ref 0.0–1.2)
Total Protein: 4.9 g/dL — ABNORMAL LOW (ref 6.5–8.1)

## 2023-10-24 LAB — GLUCOSE, CAPILLARY
Glucose-Capillary: 133 mg/dL — ABNORMAL HIGH (ref 70–99)
Glucose-Capillary: 114 mg/dL — ABNORMAL HIGH (ref 70–99)

## 2023-10-24 LAB — MAGNESIUM: Magnesium: 1.8 mg/dL (ref 1.7–2.4)

## 2023-10-24 LAB — TRIGLYCERIDES: Triglycerides: 751 mg/dL — ABNORMAL HIGH (ref <150)

## 2023-10-24 LAB — PHOSPHORUS: Phosphorus: 3.6 mg/dL (ref 2.5–4.6)

## 2023-10-24 MED ORDER — HYDROMORPHONE HCL 1 MG/ML IJ SOLN: 0.5000 mg | Freq: Once | INTRAMUSCULAR | Status: DC

## 2023-10-24 MED ORDER — ENSURE ENLIVE PO LIQD: 237.0000 mL | Freq: Two times a day (BID) | ORAL | Status: DC

## 2023-10-24 NOTE — Progress Notes (Signed)
 Noted drainage leaking from JP insertion site (drainage yellow in color). JP continues to drain. Dressing changed. Patient denies any pain or discomfort. Dr. Lincoln Renshaw aware. Skin appears clean, dry, and intact.

## 2023-10-24 NOTE — Progress Notes (Signed)
 3 Days Post-Op  Subjective: Patient has no complaints.  She is sitting up the chair and smiling.  No nausea or vomiting noted.  Objective: Vital signs in last 24 hours: Temp:  [97.8 F (36.6 C)-98.4 F (36.9 C)] 98.1 F (36.7 C) (05/26 0300) Pulse Rate:  [66-106] 81 (05/26 0940) Resp:  [14-33] 18 (05/26 0930) BP: (114-193)/(46-140) 137/87 (05/26 0940) SpO2:  [94 %-100 %] 99 % (05/26 0930) Weight:  [91.8 kg] 91.8 kg (05/26 0500) Last BM Date : 10/23/23  Intake/Output from previous day: 05/25 0701 - 05/26 0700 In: 2804.6 [P.O.:680; I.V.:1969.1; IV Piggyback:155.5] Out: 2500 [Urine:2100; Drains:140; Stool:260] Intake/Output this shift: Total I/O In: 30 [I.V.:30] Out: 550 [Urine:550]  General appearance: alert, cooperative, and no distress Resp: clear to auscultation bilaterally Cardio: regular rate and rhythm, S1, S2 normal, no murmur, click, rub or gallop GI: Soft, incision healing well.  Ostomy pink and patent.  Lab Results:  Recent Labs    10/23/23 0324 10/24/23 0312  WBC 6.4 6.3  HGB 11.2* 11.9*  HCT 31.5* 35.7*  PLT 108* 115*   BMET Recent Labs    10/23/23 0324 10/24/23 0312  NA 135 131*  K 3.2* 4.5  CL 99 98  CO2 21* 26  GLUCOSE 98 129*  BUN 61* 54*  CREATININE 1.12* 1.04*  CALCIUM 8.1* 8.3*   PT/INR Recent Labs    10/22/23 0836  LABPROT 13.5  INR 1.0    Studies/Results: No results found.  Anti-infectives: Anti-infectives (From admission, onward)    Start     Dose/Rate Route Frequency Ordered Stop   10/22/23 1000  fluconazole (DIFLUCAN) IVPB 200 mg  Status:  Discontinued        200 mg 100 mL/hr over 60 Minutes Intravenous Every 24 hours 10/21/23 0857 10/21/23 0913   10/22/23 0930  fluconazole (DIFLUCAN) IVPB 200 mg  Status:  Discontinued       Placed in "Followed by" Linked Group   200 mg 100 mL/hr over 60 Minutes Intravenous Every 24 hours 10/21/23 0831 10/21/23 0840   10/21/23 1130  fluconazole (DIFLUCAN) IVPB 400 mg  Status:   Discontinued       Placed in "Followed by" Linked Group   400 mg 100 mL/hr over 120 Minutes Intravenous  Once 10/21/23 0840 10/21/23 0945   10/21/23 1100  micafungin (MYCAMINE) 100 mg in sodium chloride  0.9 % 100 mL IVPB        100 mg 105 mL/hr over 1 Hours Intravenous Every 24 hours 10/21/23 0945     10/21/23 0930  fluconazole (DIFLUCAN) IVPB 800 mg  Status:  Discontinued       Placed in "Followed by" Linked Group   800 mg 100 mL/hr over 240 Minutes Intravenous  Once 10/21/23 0831 10/21/23 0840   10/21/23 0900  fluconazole (DIFLUCAN) IVPB 400 mg  Status:  Discontinued       Placed in "Followed by" Linked Group   400 mg 100 mL/hr over 120 Minutes Intravenous  Once 10/21/23 0840 10/21/23 0945   10/13/23 1200  cefoTEtan  (CEFOTAN ) 2 g in sodium chloride  0.9 % 100 mL IVPB        2 g 200 mL/hr over 30 Minutes Intravenous On call to O.R. 10/12/23 0836 10/13/23 1401   10/13/23 1128  sodium chloride  0.9 % with cefoTEtan  (CEFOTAN ) ADS Med       Note to Pharmacy: Gabino Joe S: cabinet override      10/13/23 1128 10/13/23 1357   10/12/23 1300  metroNIDAZOLE  (FLAGYL )  tablet 500 mg        500 mg Oral 3 times daily 10/11/23 1212 10/12/23 2116   10/12/23 1300  neomycin  (MYCIFRADIN ) tablet 1,000 mg        1,000 mg Oral 3 times daily 10/11/23 1212 10/12/23 2136   10/11/23 1300  neomycin  (MYCIFRADIN ) tablet 1,000 mg  Status:  Discontinued        1,000 mg Oral 3 times daily 10/11/23 0839 10/11/23 1212   10/11/23 1300  metroNIDAZOLE  (FLAGYL ) tablet 500 mg  Status:  Discontinued        500 mg Oral 3 times daily 10/11/23 0839 10/11/23 1212   10/07/23 1500  piperacillin -tazobactam (ZOSYN ) IVPB 3.375 g        3.375 g 12.5 mL/hr over 240 Minutes Intravenous Every 8 hours 10/07/23 1356 10/19/23 1400       Assessment/Plan: s/p Procedure(s): EGD (ESOPHAGOGASTRODUODENOSCOPY) Impression: Postoperative day 11.  Patient in much better spirits.  Hemoglobin is stable.  Some decrease in soft tissue swelling  noted.  She is in normal sinus rhythm. Plan: Will stop TPN and advance to soft diet.  Would not place on anticoagulation at the present time.  Family present and all questions answered.  LOS: 17 days    Felicia Frank 10/24/2023

## 2023-10-24 NOTE — Plan of Care (Signed)

## 2023-10-24 NOTE — Plan of Care (Signed)
  Problem: Clinical Measurements: Goal: Cardiovascular complication will be avoided Outcome: Progressing   Problem: Activity: Goal: Risk for activity intolerance will decrease Outcome: Progressing   Problem: Elimination: Goal: Will not experience complications related to urinary retention Outcome: Progressing   Problem: Nutrition: Goal: Adequate nutrition will be maintained Outcome: Not Progressing   Problem: Coping: Goal: Level of anxiety will decrease Outcome: Not Progressing

## 2023-10-24 NOTE — Progress Notes (Signed)
 Physical Therapy Treatment Patient Details Name: Felicia Frank MRN: [id number] DOB: February 12, 1946 Today's Date: 10/24/2023   History of Present Illness Felicia Frank is a 78 y.o. 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     Workup in the ED demonstrating elevated WBCs and abnormal CT scan with concern for enteral fistula formation, contained abscess and diverticulitis.  Unfortunately given location of this new contained abscess no ability or safe margin for placement percutaneous drain.     Case was discussed with general surgery who recommended admission for IV antibiotics and anticipated surgical intervention on 10/10/2023.    PT Comments  Pt friendly and willing to participate with therapy today.  Pt required mod A and increased time to complete bed mobility with main assistance be with LE due to weakness.  Increased time and cueing for hand placement to assist with scooting to EOB.  Min A for transfer to standing and gait training with use of RW.  Pt present with fatigue and LE weakness following short duration of gait.  EOS pt left in chair with call bell within reach, RN aware of status.     If plan is discharge home, recommend the following:     Can travel by private vehicle        Equipment Recommendations       Recommendations for Other Services       Precautions / Restrictions Precautions Precautions: Fall Restrictions Weight Bearing Restrictions Per Provider  Order: No     Mobility  Bed Mobility Overal bed mobility: Needs Assistance Bed Mobility: Supine to Sit Rolling: Min assist   Supine to sit: Mod assist     General bed mobility comments: slow labored movement, required assistance with LE due to weakness    Transfers Overall transfer level: Needs assistance Equipment used: Rolling walker (2 wheels) Transfers: Sit to/from Stand Sit to Stand: Min assist           General transfer comment: increased time, labored movment, cueing for handplacement to assist    Ambulation/Gait Ambulation/Gait assistance: Mod assist Gait Distance (Feet): 15 Feet Assistive device: Rolling walker (2 wheels)   Gait velocity: slow     General Gait Details: limited to a few slow labored steps forward/backwards before having to sit due to c/o fatigue, BLE weakness   Stairs             Wheelchair Mobility     Tilt Bed    Modified Rankin (Stroke Patients Only)       Balance                                            Communication    Cognition Arousal: Alert Behavior During Therapy: WFL for tasks assessed/performed   PT - Cognitive impairments: No apparent impairments                                Cueing    Exercises  General Comments        Pertinent Vitals/Pain Pain Assessment Pain Assessment: No/denies pain    Home Living                          Prior Function            PT Goals (current goals can now be found in the care plan section)      Frequency           PT Plan      Co-evaluation              AM-PAC PT "6 Clicks" Mobility   Outcome Measure  Help needed turning from your back to your side while in a flat bed without using bedrails?: A Lot Help needed moving from lying on your back to sitting on the side of a flat bed without using bedrails?: A Lot Help needed moving to and from a bed to a chair (including a wheelchair)?: A  Lot Help needed standing up from a chair using your arms (e.g., wheelchair or bedside chair)?: A Little Help needed to walk in hospital room?: A Lot Help needed climbing 3-5 steps with a railing? : A Lot 6 Click Score: 13    End of Session Equipment Utilized During Treatment: Gait belt Activity Tolerance: Patient tolerated treatment well;Patient limited by fatigue Patient left: in chair;with call bell/phone within reach Nurse Communication: Mobility status       Time: 6578-4696 PT Time Calculation (min) (ACUTE ONLY): 27 min  Charges:    $Therapeutic Activity: 23-37 mins PT General Charges $$ ACUTE PT VISIT: 1 Visit                     Minor Amble, LPTA/CLT; CBIS [phone]  [name] 10/24/2023, 9:51 AM

## 2023-10-24 NOTE — Progress Notes (Signed)
 PROGRESS NOTE   Felicia Frank  RUE:[id number] DOB: [birth year] DOA: 10/07/2023 PCP: Allana Ishikawa, MD   Chief Complaint  Patient presents with   Abdominal Pain   Level of care: Stepdown  Brief Admission History:  78 y.o. female with medical history significant of hypothyroidism, hypertension, chronic kidney disease stage IIIa, class I obesity, atrial fibrillation and recent hospitalization secondary to diverticulitis with perforation and contained abscess status post drain placement and enteral fistula formation; who presented to the hospital secondary to ongoing abdominal pain, associated nausea/intermittent vomiting and difficulty keeping things down.  Patient has been admitted with diverticulitis of the colon with perforation and enterocolonic fistula.  She is status post Hartman's procedure with small bowel resection and ileocecectomy on 5/15.  Patient remains on TPN and Lasix  and continues to have some elevated heart rates requiring recurrent amiodarone  bolus and ongoing drip as well as IV metoprolol .  Surgery team following and Cardiology consulted for assistance in management as patient remains on IV amiodarone  infusion.      Assessment and Plan:  Hemorrhagic Shock - RESOLVED  - pressors had to be given briefly due to persistent hypotension but now has been off pressors after PRBC transfusion given on 5/24 - remains off IV norepinephrine infusion - Hemoglobin has been stable now for last 2 days which is reassuring  Diverticulitis of the colon with perforation and enterocolonic fistula status post Hartman's procedure with small bowel resection and ileocecectomy 5/15. - Completed IV antibiotics with Zosyn  for 5 days postoperatively thru 5/21 - Appreciate general surgery recommendations to advance to clear liquid diet and remove NG tube and continue TPN until tolerating solid food. - Started on Lasix  5/16 and -5.1 L fluid balance noted in the last 24 hours with stable creatinine  levels with - restarted scheduled IV lasix  on 5/25 - Discontinued Foley catheter and monitor voiding output   Rare Candida species seen in intraabdominal deep wound culture - pharmacist consulted to start antifungal IV therapy - micafungin IV ordered   Hypertriglyceridemia - triglycerides 751 from TNA and oral intake - called and discussed with pharm D to remove lipids from TNA - want to avoid acute pancreatitis  Leukocytosis - resolved  - WBC now down to normal   - added antifungal coverage on 5/23  Severe hypoalbuminemia with 3rd spacing  - IV albumin  25 gram every 6 hours x 3 doses ordered 5/23  - due to severe edema in upper arms, venous doppler US  negative for DVT  - restarted scheduled IV lasix  20 mg every 8 hours on 5/25  Filed Weights   10/22/23 0500 10/23/23 0500 10/24/23 0500  Weight: 89.7 kg 91 kg 91.8 kg   Intake/Output Summary (Last 24 hours) at 10/24/2023 1011 Last data filed at 10/24/2023 1000 Gross per 24 hour  Intake 2584.56 ml  Output 3050 ml  Net -465.44 ml   Chronic paroxysmal atrial fibrillation with RVR-controlled - initially treated with IV metoprolol  and IV amiodarone  infusion - Cardiology team has been following  - IV heparin  stopped on 10/19/23 due to drop in Hg - transitioned from IV amiodarone  to oral amiodarone  5/25 - transitioned from IV lopressor  to oral lopressor  on 5/25  - remains on amiodarone  200 mg BID, metoprolol  100 mg BID (total 200 mg/d)   Acute hematemesis - resolved now  - pt reported vomited large amounts of black vomitus (500 mL) overnight; hg down to 6.4. CT angio GI bleed study negative for GI bleeding.  Intraluminal blood product noted within the  small bowel in the region of the small bowel anastomosis resulting in a partial SBO.   - Pt is being treated supportively  - transfused 2 unit PRBC on 5/22, repeat transfusion of 2 units PRBC on 5/23, another 2 units PRBC ordered 5/24  - Hg stable at 11 for last 2 days   Acute blood loss  anemia-Hg declined to 6.4 after IV heparin  infusion started - now heparin  is discontinued - Status post 2 unit PRBC transfusion 5/17  - s/p 1 unit PRBC on 5/21, 3 units PRBC ordered on 5/22 - Hg back down to 6.4 on 5/23, transfused 2 units PRBC, Hg down to 7.8, transfuse 2 additional units PRBC on 5/24 - EGD done 5/23 to check for upper GI bleeding - nonbleeding gastric ulcer found no source of bleeding seen.  - Hg stable at 11 for last 2 days   essential hypertension - now off pressor support and shock has resolved - currently on metoprolol  tartrate 100 mg BID   hypothyroidism - Continue Synthroid . - Taking p.o. daily with no difficulty   acute kidney injury in the setting of chronic kidney disease stage IIIa - Creatinine improving with diuresis - stable    GERD - Continue PPI   compression fracture of L1 - Continue as needed analgesia - Will recommend continued use of TLSO brace at discharge.   class I obesity -Body mass index is 32.28 kg/m. - Low-calorie diet and portion control discussed with patient.   DVT prophylaxis: IV heparin  was resumed 5/19, stopped on 5/21 due to drop in Hg, SCDs Code Status: Full  Family Communication: bedside update 5/22, son 5/25  Disposition: anticipating SNF ([facility]) later in week if recovery permits   Consultants:  Surgery Cardiology  Procedures:   Antimicrobials:    Subjective: Pt says her legs were twitching overnight, she is a little bit tired from sitting up in chair yesterday but willing to try again.        Objective: Vitals:   10/24/23 0800 10/24/23 0830 10/24/23 0930 10/24/23 0940  BP: (!) 114/46 135/78 (!) 151/81 137/87  Pulse: 66 81 86 81  Resp: 20 (!) 23 18   Temp:      TempSrc:      SpO2: 98% 97% 99%   Weight:      Height:        Intake/Output Summary (Last 24 hours) at 10/24/2023 1011 Last data filed at 10/24/2023 1000 Gross per 24 hour  Intake 2584.56 ml  Output 3050 ml  Net -465.44 ml   Filed  Weights   10/22/23 0500 10/23/23 0500 10/24/23 0500  Weight: 89.7 kg 91 kg 91.8 kg   Examination:  General exam: Appears weak pale and somnolent, but NAD, cooperative.   Respiratory system: Clear to auscultation. Respiratory effort normal. Cardiovascular system: irregularly irregular normal S1 & S2 heard. trace pedal edema. Gastrointestinal system: Abdomen is nondistended, soft and nontender. Ostomy with black output seen. Less amount of melanotic output from ostomy seen. Central nervous system: Alert and oriented. No focal neurological deficits. Extremities: 2++ edema pitting Bilateral upper extremities.  Symmetric 5 x 5 power. Skin: No rashes, lesions or ulcers. Psychiatry: Judgement and insight appear normal. Mood & affect appropriate.   Data Reviewed: I have personally reviewed following labs and imaging studies  CBC: Recent Labs  Lab 10/20/23 0425 10/20/23 1732 10/21/23 0450 10/21/23 1703 10/22/23 9604 10/22/23 0836 10/22/23 1957 10/23/23 0324 10/24/23 0312  WBC 11.4*  --  18.9*  --  14.6*  --   --  6.4 6.3  NEUTROABS  --   --   --   --  9.9*  --   --  4.9 4.9  HGB 6.6*   < > 6.4*   < > 7.8* 7.8* 11.4* 11.2* 11.9*  HCT 18.9*   < > 19.5*   < > 22.9* 22.3* 32.8* 31.5* 35.7*  MCV 92.2  --  93.8  --  88.4  --   --  86.3 98.1  PLT 125*  --  141*  --  105*  --   --  108* 115*   < > = values in this interval not displayed.    Basic Metabolic Panel: Recent Labs  Lab 10/18/23 0951 10/19/23 0016 10/20/23 0425 10/21/23 0450 10/22/23 0311 10/23/23 0324 10/24/23 0312  NA 129* 135 132* 129* 129* 135 131*  K 4.3 4.7 4.5 5.1 3.9 3.2* 4.5  CL 93* 99 104 105 103 99 98  CO2 28 25 22  19* 20* 21* 26  GLUCOSE 119* 170* 138* 143* 111* 98 129*  BUN 40* 50* 55* 60* 61* 61* 54*  CREATININE 1.21* 1.17* 1.10* 1.24* 1.27* 1.12* 1.04*  CALCIUM 8.2* 7.7* 7.3* 7.5* 8.1* 8.1* 8.3*  MG 1.7 1.6* 1.9 1.9  --   --  1.8  PHOS  --  1.6* 3.4 3.6  --   --  3.6    CBG: Recent Labs  Lab  10/22/23 2040 10/23/23 0547 10/23/23 1419 10/23/23 2116 10/24/23 0511  GLUCAP 124* 119* 140* 118* 133*    No results found for this or any previous visit (from the past 240 hours).    Radiology Studies: No results found.  Scheduled Meds:  sodium chloride    Intravenous Once   acetaminophen   1,000 mg Oral Q6H   amiodarone   200 mg Oral BID   Chlorhexidine  Gluconate Cloth  6 each Topical Daily   furosemide   20 mg Intravenous Q8H    HYDROmorphone  (DILAUDID ) injection  0.5 mg Intravenous Once   insulin  aspart  0-15 Units Subcutaneous Q8H   levothyroxine   50 mcg Oral Daily   metoprolol  tartrate  100 mg Oral BID   pantoprazole  (PROTONIX ) IV  40 mg Intravenous Q12H   potassium chloride   40 mEq Oral BID   sodium chloride  flush  10-40 mL Intracatheter Q12H   Continuous Infusions:  micafungin (MYCAMINE) 100 mg in sodium chloride  0.9 % 100 mL IVPB Stopped (10/23/23 1203)   norepinephrine (LEVOPHED) Adult infusion Stopped (10/22/23 1120)   TPN ADULT (ION) 75 mL/hr at 10/24/23 0313    LOS: 17 days   Critical Care Procedure Note Authorized and Performed by: Olga Berthold MD  Total Critical Care time: 55 mins Due to a high probability of clinically significant, life threatening deterioration, the patient required my highest level of preparedness to intervene emergently and I personally spent this critical care time directly and personally managing the patient.  This critical care time included obtaining a history; examining the patient, pulse oximetry; ordering and review of studies; arranging urgent treatment with development of a management plan; evaluation of patient's response of treatment; frequent reassessment; and discussions with other providers.  This critical care time was performed to assess and manage the high probability of imminent and life threatening deterioration that could result in multi-organ failure.  It was exclusive of separately billable procedures and treating other  patients and teaching time.    Faustino Hook, MD How to contact the [facility] Attending or Consulting provider 7A -  7P or covering provider during after hours 7P -7A, for this patient?  Check the care team in [facility] and look for a) attending/consulting [facility] provider listed and b) the [facility] team listed Log into [email or web address] to find provider on call.  Locate the [facility] provider you are looking for under [facility] and page to a number that you can be directly reached. If you still have difficulty reaching the provider, please page the [facility] (Director on Call) for the Hospitalists listed on amion for assistance.  10/24/2023, 10:11 AM

## 2023-10-25 ENCOUNTER — Encounter (HOSPITAL_COMMUNITY): Payer: Self-pay | Admitting: Internal Medicine

## 2023-10-25 DIAGNOSIS — E876 Hypokalemia: Secondary | ICD-10-CM | POA: Diagnosis not present

## 2023-10-25 DIAGNOSIS — N179 Acute kidney failure, unspecified: Secondary | ICD-10-CM | POA: Diagnosis not present

## 2023-10-25 DIAGNOSIS — K5720 Diverticulitis of large intestine with perforation and abscess without bleeding: Secondary | ICD-10-CM | POA: Diagnosis not present

## 2023-10-25 LAB — CBC WITH DIFFERENTIAL/PLATELET
Abs Immature Granulocytes: 0.06 10*3/uL (ref 0.00–0.07)
Basophils Absolute: 0 10*3/uL (ref 0.0–0.1)
Basophils Relative: 0 %
Eosinophils Absolute: 0.3 10*3/uL (ref 0.0–0.5)
Eosinophils Relative: 4 %
HCT: 34.7 % — ABNORMAL LOW (ref 36.0–46.0)
Hemoglobin: 11.5 g/dL — ABNORMAL LOW (ref 12.0–15.0)
Immature Granulocytes: 1 %
Lymphocytes Relative: 21 %
Lymphs Abs: 1.3 10*3/uL (ref 0.7–4.0)
MCH: 29.9 pg (ref 26.0–34.0)
MCHC: 33.1 g/dL (ref 30.0–36.0)
MCV: 90.4 fL (ref 80.0–100.0)
Monocytes Absolute: 0.4 10*3/uL (ref 0.1–1.0)
Monocytes Relative: 7 %
Neutro Abs: 4.3 10*3/uL (ref 1.7–7.7)
Neutrophils Relative %: 67 %
Platelets: 134 10*3/uL — ABNORMAL LOW (ref 150–400)
RBC: 3.84 MIL/uL — ABNORMAL LOW (ref 3.87–5.11)
RDW: 18.5 % — ABNORMAL HIGH (ref 11.5–15.5)
WBC: 6.3 10*3/uL (ref 4.0–10.5)
nRBC: 0 % (ref 0.0–0.2)

## 2023-10-25 LAB — PHOSPHORUS: Phosphorus: 3.9 mg/dL (ref 2.5–4.6)

## 2023-10-25 LAB — COMPREHENSIVE METABOLIC PANEL WITH GFR
ALT: 46 U/L — ABNORMAL HIGH (ref 0–44)
AST: 31 U/L (ref 15–41)
Albumin: 2.4 g/dL — ABNORMAL LOW (ref 3.5–5.0)
Alkaline Phosphatase: 72 U/L (ref 38–126)
Anion gap: 5 (ref 5–15)
BUN: 51 mg/dL — ABNORMAL HIGH (ref 8–23)
CO2: 28 mmol/L (ref 22–32)
Calcium: 8.3 mg/dL — ABNORMAL LOW (ref 8.9–10.3)
Chloride: 99 mmol/L (ref 98–111)
Creatinine, Ser: 1.12 mg/dL — ABNORMAL HIGH (ref 0.44–1.00)
GFR, Estimated: 50 mL/min — ABNORMAL LOW (ref >60)
Glucose, Bld: 80 mg/dL (ref 70–99)
Potassium: 4.3 mmol/L (ref 3.5–5.1)
Sodium: 132 mmol/L — ABNORMAL LOW (ref 135–145)
Total Bilirubin: 0.8 mg/dL (ref 0.0–1.2)
Total Protein: 4.6 g/dL — ABNORMAL LOW (ref 6.5–8.1)

## 2023-10-25 LAB — MAGNESIUM: Magnesium: 1.8 mg/dL (ref 1.7–2.4)

## 2023-10-25 MED ORDER — FLUCONAZOLE 100 MG PO TABS: 200.0000 mg | ORAL_TABLET | Freq: Every day | ORAL | Status: DC

## 2023-10-25 MED ORDER — MAGNESIUM SULFATE 2 GM/50ML IV SOLN
2.0000 g | Freq: Once | INTRAVENOUS | Status: AC
  Administered 2023-10-25: 2 g via INTRAVENOUS
  Filled 2023-10-25: qty 50

## 2023-10-25 MED ORDER — HYDROMORPHONE HCL 1 MG/ML IJ SOLN
0.5000 mg | INTRAMUSCULAR | Status: DC | PRN
  Administered 2023-10-25 – 2023-10-26 (×2): 0.5 mg via INTRAVENOUS
  Filled 2023-10-25 (×2): qty 0.5

## 2023-10-25 MED ORDER — FLUCONAZOLE 150 MG PO TABS
400.0000 mg | ORAL_TABLET | Freq: Every day | ORAL | Status: AC
  Administered 2023-10-26: 400 mg via ORAL
  Filled 2023-10-25: qty 1

## 2023-10-25 MED ORDER — PANTOPRAZOLE SODIUM 40 MG PO TBEC
40.0000 mg | DELAYED_RELEASE_TABLET | Freq: Two times a day (BID) | ORAL | Status: DC
  Administered 2023-10-25 – 2023-10-27 (×4): 40 mg via ORAL
  Filled 2023-10-25 (×4): qty 1

## 2023-10-25 NOTE — TOC Progression Note (Signed)
 Transition of Care [facility]) - Progression Note    Patient Details  Name: Felicia Frank MRN: [id number] Date of Birth: 05-Sep-1945  Transition of [facility]) CM/SW Contact  [place]ville Lax, [place] Phone Number: 10/25/2023, 10:23 AM  Clinical Narrative:    CSW updated [name] in admissions with [name] that pt will likely be medically stable for D/C to SNF later this week. Insurance auth to be started today for SNF. TOC to follow.   Expected Discharge Plan: Home w Home Health Services Barriers to Discharge: Continued Medical Work up  Expected Discharge Plan and Services In-house Referral: Clinical Social Work Discharge Planning Services: CM Consult Post Acute Care Choice: Home Health Living arrangements for the past 2 months: Single Family Home                                       Social Determinants of Health (SDOH) Interventions SDOH Screenings   Food Insecurity: No Food Insecurity (10/08/2023)  Housing: Low Risk  (10/08/2023)  Transportation Needs: No Transportation Needs (10/08/2023)  Utilities: Not At Risk (10/08/2023)  Social Connections: Socially Integrated (10/08/2023)  Tobacco Use: Low Risk  (10/21/2023)    Readmission Risk Interventions    10/10/2023   11:26 AM 10/07/2023   10:06 PM 09/16/2023   10:04 PM  Readmission Risk Prevention Plan  Post Dischage Appt   Complete  Medication Screening   Complete  Transportation Screening Complete Complete Complete  PCP or Specialist Appt within 5-7 Days  Complete   Home Care Screening  Complete   Medication Review (RN CM)  Complete   HRI or Home Care Consult Complete    Social Work Consult for Recovery Care Planning/Counseling Complete    Palliative Care Screening Not Applicable    Medication Review Oceanographer) Complete

## 2023-10-25 NOTE — Progress Notes (Signed)
 PROGRESS NOTE   Felicia Frank  ZOX:[id number] DOB: 01/16/1946 DOA: 10/07/2023 PCP: Allana Ishikawa, MD   Chief Complaint  Patient presents with   Abdominal Pain   Level of care: Stepdown  Brief Admission History:  78 y.o. female with medical history significant of hypothyroidism, hypertension, chronic kidney disease stage IIIa, class I obesity, atrial fibrillation and recent hospitalization secondary to diverticulitis with perforation and contained abscess status post drain placement and enteral fistula formation; who presented to the hospital secondary to ongoing abdominal pain, associated nausea/intermittent vomiting and difficulty keeping things down.  Patient has been admitted with diverticulitis of the colon with perforation and enterocolonic fistula.  She is status post Hartman's procedure with small bowel resection and ileocecectomy on 5/15.  Patient remains on TPN and Lasix  and continues to have some elevated heart rates requiring recurrent amiodarone  bolus and ongoing drip as well as IV metoprolol .  Surgery team following and Cardiology consulted for assistance in management as patient remains on IV amiodarone  infusion.      Assessment and Plan:  Hemorrhagic Shock - RESOLVED  - pressors had to be given briefly due to persistent hypotension but now has been off pressors after PRBC transfusion given on 5/24 - remains off IV norepinephrine infusion - Hemoglobin has been stable now for last 2 days which is reassuring  Diverticulitis of the colon with perforation and enterocolonic fistula status post Hartman's procedure with small bowel resection and ileocecectomy 5/15. - Completed IV antibiotics with Zosyn  for 5 days postoperatively thru 5/21 - Appreciate general surgery recommendations to advance to clear liquid diet and remove NG tube and continue TPN until tolerating solid food. - Started on Lasix  5/16 and -5.1 L fluid balance noted in the last 24 hours with stable creatinine  levels with - restarted scheduled IV lasix  on 5/25 - Discontinued Foley catheter and monitor voiding output   Rare Candida species seen in intraabdominal deep wound culture - pharmacist consulted to start antifungal IV therapy - micafungin IV ordered  - discussed with Dr. Larrie Po, planning for total 2 week course of antifungal therapy for treatment  - planning transition to oral antifungal tomorrow 5/28 to complete remaining days of treatment  Hypertriglyceridemia - triglycerides 751 from TNA and oral intake - called and discussed with pharm D to remove lipids from TNA - TNA was discontinued on 10/24/23  Leukocytosis - resolved  - WBC now down to normal   - added antifungal coverage on 5/23 with improvement in WBC  Severe hypoalbuminemia with 3rd spacing  - IV albumin  25 gram every 6 hours x 3 doses ordered 5/23  - due to severe edema in upper arms, venous doppler US  negative for DVT  - restarted scheduled IV lasix  20 mg every 8 hours on 5/25  Filed Weights   10/23/23 0500 10/24/23 0500 10/25/23 0500  Weight: 91 kg 91.8 kg 90.9 kg   Intake/Output Summary (Last 24 hours) at 10/25/2023 1118 Last data filed at 10/25/2023 0800 Gross per 24 hour  Intake 240 ml  Output 730 ml  Net -490 ml   Chronic paroxysmal atrial fibrillation with RVR-controlled - initially treated with IV metoprolol  and IV amiodarone  infusion - Cardiology team has been following  - IV heparin  stopped on 10/19/23 due to drop in Hg - transitioned from IV amiodarone  to oral amiodarone  5/25 - transitioned from IV lopressor  to oral lopressor  on 5/25  - remains on amiodarone  200 mg BID, metoprolol  100 mg BID (total 200 mg/d)   Acute hematemesis -  resolved now  - pt reported vomited large amounts of black vomitus (500 mL) overnight; hg down to 6.4. CT angio GI bleed study negative for GI bleeding.  Intraluminal blood product noted within the small bowel in the region of the small bowel anastomosis resulting in a partial  SBO.   - Pt is being treated supportively  - transfused 2 unit PRBC on 5/22, repeat transfusion of 2 units PRBC on 5/23, another 2 units PRBC ordered 5/24  - Hg stable at 11 for last 3 days   Acute blood loss anemia-Hg declined to 6.4 after IV heparin  infusion started - now heparin  is discontinued - Status post 2 unit PRBC transfusion 5/17  - s/p 1 unit PRBC on 5/21, 3 units PRBC ordered on 5/22 - Hg back down to 6.4 on 5/23, transfused 2 units PRBC, Hg down to 7.8, transfuse 2 additional units PRBC on 5/24 - EGD done 5/23 to check for upper GI bleeding - nonbleeding gastric ulcer found no source of bleeding seen.  - Hg stable at 11 for last 3 days   essential hypertension - now off pressor support and shock has resolved - currently on metoprolol  tartrate 100 mg BID   hypothyroidism - Continue Synthroid . - Taking p.o. daily with no difficulty   acute kidney injury in the setting of chronic kidney disease stage IIIa - Creatinine improved to stabilized with diuresis - following BMP   GERD - Continue PPI   compression fracture of L1 - Continue as needed analgesia - Will recommend continued use of TLSO brace at discharge.   class I obesity -Body mass index is 32.28 kg/m. - Low-calorie diet and portion control discussed with patient.   DVT prophylaxis: IV heparin  was resumed 5/19, stopped on 5/21 due to drop in Hg, SCDs Code Status: Full  Family Communication: bedside update 5/22, son 5/25  Disposition: anticipating SNF ([facility]) later in week when insurance authorizes  Consultants:  Surgery Cardiology  Procedures:   Antimicrobials:    Subjective: Pt says she can notice some improvement in swelling in her arms but still has a lot of swelling in feet.  Pt says she has been eating and drinking better on her own.   Objective: Vitals:   10/25/23 0630 10/25/23 0753 10/25/23 0800 10/25/23 0858  BP: 139/72  (!) 140/70 (!) 163/74  Pulse: 64  70 78  Resp: 17  (!) 25    Temp:  98 F (36.7 C)    TempSrc:  Oral    SpO2: 97%  98%   Weight:      Height:        Intake/Output Summary (Last 24 hours) at 10/25/2023 1118 Last data filed at 10/25/2023 0800 Gross per 24 hour  Intake 240 ml  Output 730 ml  Net -490 ml   Filed Weights   10/23/23 0500 10/24/23 0500 10/25/23 0500  Weight: 91 kg 91.8 kg 90.9 kg   Examination:  General exam: Appears awake, alert, NAD, cooperative.   Respiratory system: Clear to auscultation. Respiratory effort normal. Cardiovascular system: irregularly irregular normal S1 & S2 heard. 2+ pedal edema pitting Gastrointestinal system: Abdomen is nondistended, soft and nontender. Ostomy with black output seen. Less amount of melanotic output from ostomy seen. Central nervous system: Alert and oriented. No focal neurological deficits. Extremities: 1+edema pitting Bilateral upper extremities.  1-2+ pitting edema BLEs. Symmetric 5 x 5 power. Skin: No rashes, lesions or ulcers. Psychiatry: Judgement and insight appear normal. Mood & affect appropriate.  Data Reviewed: I have personally reviewed following labs and imaging studies  CBC: Recent Labs  Lab 10/21/23 0450 10/21/23 1703 10/22/23 0311 10/22/23 0836 10/22/23 1957 10/23/23 0324 10/24/23 0312 10/25/23 0446  WBC 18.9*  --  14.6*  --   --  6.4 6.3 6.3  NEUTROABS  --   --  9.9*  --   --  4.9 4.9 4.3  HGB 6.4*   < > 7.8* 7.8* 11.4* 11.2* 11.9* 11.5*  HCT 19.5*   < > 22.9* 22.3* 32.8* 31.5* 35.7* 34.7*  MCV 93.8  --  88.4  --   --  86.3 98.1 90.4  PLT 141*  --  105*  --   --  108* 115* 134*   < > = values in this interval not displayed.    Basic Metabolic Panel: Recent Labs  Lab 10/19/23 0016 10/20/23 0425 10/21/23 0450 10/22/23 0311 10/23/23 0324 10/24/23 0312 10/25/23 0446  NA 135 132* 129* 129* 135 131* 132*  K 4.7 4.5 5.1 3.9 3.2* 4.5 4.3  CL 99 104 105 103 99 98 99  CO2 25 22 19* 20* 21* 26 28  GLUCOSE 170* 138* 143* 111* 98 129* 80  BUN 50* 55* 60*  61* 61* 54* 51*  CREATININE 1.17* 1.10* 1.24* 1.27* 1.12* 1.04* 1.12*  CALCIUM 7.7* 7.3* 7.5* 8.1* 8.1* 8.3* 8.3*  MG 1.6* 1.9 1.9  --   --  1.8 1.8  PHOS 1.6* 3.4 3.6  --   --  3.6 3.9    CBG: Recent Labs  Lab 10/23/23 0547 10/23/23 1419 10/23/23 2116 10/24/23 0511 10/24/23 1152  GLUCAP 119* 140* 118* 133* 114*    No results found for this or any previous visit (from the past 240 hours).    Radiology Studies: No results found.  Scheduled Meds:  acetaminophen   1,000 mg Oral Q6H   amiodarone   200 mg Oral BID   Chlorhexidine  Gluconate Cloth  6 each Topical Daily   furosemide   20 mg Intravenous Q8H   levothyroxine   50 mcg Oral Daily   metoprolol  tartrate  100 mg Oral BID   pantoprazole  (PROTONIX ) IV  40 mg Intravenous Q12H   potassium chloride   40 mEq Oral BID   sodium chloride  flush  10-40 mL Intracatheter Q12H   Continuous Infusions:  micafungin (MYCAMINE) 100 mg in sodium chloride  0.9 % 100 mL IVPB 100 mg (10/24/23 1030)    LOS: 18 days   Critical Care Procedure Note Authorized and Performed by: Olga Berthold MD  Total Critical Care time: 53 mins Due to a high probability of clinically significant, life threatening deterioration, the patient required my highest level of preparedness to intervene emergently and I personally spent this critical care time directly and personally managing the patient.  This critical care time included obtaining a history; examining the patient, pulse oximetry; ordering and review of studies; arranging urgent treatment with development of a management plan; evaluation of patient's response of treatment; frequent reassessment; and discussions with other providers.  This critical care time was performed to assess and manage the high probability of imminent and life threatening deterioration that could result in multi-organ failure.  It was exclusive of separately billable procedures and treating other patients and teaching time.    Faustino Hook, MD How to contact the [facility] Attending or Consulting provider 7A - 7P or covering provider during after hours 7P -7A, for this patient?  Check the care team in [facility] and look for a) attending/consulting [facility] provider  listed and b) the [facility] team listed Log into [email or web address] to find provider on call.  Locate the [facility] provider you are looking for under [facility] and page to a number that you can be directly reached. If you still have difficulty reaching the provider, please page the [facility] (Director on Call) for the Hospitalists listed on amion for assistance.  10/25/2023, 11:18 AM

## 2023-10-25 NOTE — Progress Notes (Signed)
 Nutrition Follow-up  DOCUMENTATION CODES:   Not applicable  INTERVENTION:   D/C Ensure, patient refusing. Add Magic cup TID with meals, each supplement provides 290 kcal and 9 grams of protein.  NUTRITION DIAGNOSIS:   Increased nutrient needs related to post-op healing as evidenced by estimated needs. Ongoing.  GOAL:   Patient will meet greater than or equal to 90% of their needs Progressing   MONITOR:   I & O's, Diet advancement  REASON FOR ASSESSMENT:   Consult New TPN/TNA  ASSESSMENT:   78 yo female admitted with diverticulitis of colon with perforation. PMH includes HTN, hypothyroidism, diverticulosis.  5/9 - 5/14 patient was on full liquids, minimal intake. 5/14: changed to clear liquids. 5/15: NPO for surgery. 5/15: S/P partial colectomy with colostomy creation, partial small bowel resection, ileocecectomy.   5/16: TPN initiated. 5/20: starting clear liquids. NG tube removed.  5/23: full liquids. 5/26: soft diet; received last bag of TPN.  Diet advanced to soft yesterday. Patient consumed 60% of breakfast today. Ensure was ordered this morning, but patient does not like it. She agreed to try magic cups with meals instead.   Labs reviewed. Na 132  Medications reviewed and include lasix , protonix , klor-con . Received mag sulfate 2g IV this morning.   JP drain in abdomen with 30 ml output x 24 hours.   Admit weight 85.3 kg (5/9) Current weight 90.9 kg (5/27)  Patient with ongoing severe edema, masking actual weight loss.  Diet Order:   Diet Order             DIET SOFT Fluid consistency: Thin  Diet effective now                   EDUCATION NEEDS:   No education needs have been identified at this time  Skin:  Skin Assessment: Skin Integrity Issues: Skin Integrity Issues:: Other (Comment), Incisions Incisions: abdomen Other: pin hole sized red open area to R buttock  Last BM:  5/26 type 7  Height:   Ht Readings from Last 1 Encounters:   10/13/23 5\' 4"  (1.626 m)    Weight:   Wt Readings from Last 1 Encounters:  10/25/23 90.9 kg    Ideal Body Weight:  54.5 kg  BMI:  Body mass index is 34.4 kg/m.  Estimated Nutritional Needs:   Kcal:  1700-1900  Protein:  90-110 gm  Fluid:  1.7-1.9 L   Barnet Boots RD, LDN, CNSC Contact via secure chat. If unavailable, use group chat "RD Inpatient."

## 2023-10-25 NOTE — Progress Notes (Signed)
 Progress Note  Patient Name: Felicia Frank Date of Encounter: 10/25/2023  Primary Cardiologist: Armida Lander, MD  Interval Summary   Chart reviewed since cardiology follow-up last week.  Patient is in sinus rhythm, reports no palpitations or chest discomfort.  Still has peripheral edema, although indicates that it is gradually getting better mainly in the upper extremities.  Net urine output of approximately 1200 cc / 24 hours.  Vital Signs    Vitals:   10/25/23 0500 10/25/23 0600 10/25/23 0630 10/25/23 0753  BP:  138/67 139/72   Pulse:  68 64   Resp:  18 17   Temp:    98 F (36.7 C)  TempSrc:    Oral  SpO2:  98% 97%   Weight: 90.9 kg     Height:        Intake/Output Summary (Last 24 hours) at 10/25/2023 0830 Last data filed at 10/24/2023 1730 Gross per 24 hour  Intake 30 ml  Output 730 ml  Net -700 ml   Filed Weights   10/23/23 0500 10/24/23 0500 10/25/23 0500  Weight: 91 kg 91.8 kg 90.9 kg    Physical Exam   GEN: No acute distress.   Neck: No JVD. Cardiac: RRR, no murmur, rub, or gallop.  Respiratory: Nonlabored. Clear to auscultation bilaterally. GI: Soft, nontender, bowel sounds present. MS: Peripheral edema, soft. Neuro:  Nonfocal. Psych: Alert and oriented x 3. Normal affect.  ECG/Telemetry    Telemetry reviewed showing sinus rhythm.  Labs    Chemistry Recent Labs  Lab 10/23/23 0324 10/24/23 0312 10/25/23 0446  NA 135 131* 132*  K 3.2* 4.5 4.3  CL 99 98 99  CO2 21* 26 28  GLUCOSE 98 129* 80  BUN 61* 54* 51*  CREATININE 1.12* 1.04* 1.12*  CALCIUM 8.1* 8.3* 8.3*  PROT 4.4* 4.9* 4.6*  ALBUMIN  2.6* 2.6* 2.4*  AST 34 34 31  ALT 46* 52* 46*  ALKPHOS 51 78 72  BILITOT 0.8 0.5 0.8  GFRNONAA 50* 55* 50*  ANIONGAP 15 7 5     Hematology Recent Labs  Lab 10/23/23 0324 10/24/23 0312 10/25/23 0446  WBC 6.4 6.3 6.3  RBC 3.65* 3.64* 3.84*  HGB 11.2* 11.9* 11.5*  HCT 31.5* 35.7* 34.7*  MCV 86.3 98.1 90.4  MCH 30.7 32.7 29.9  MCHC  35.6 33.3 33.1  RDW 16.9* 19.3* 18.5*  PLT 108* 115* 134*    Cardiac Studies   Echocardiogram 09/21/2023:  1. Left ventricular ejection fraction, by estimation, is 65 to 70%. The  left ventricle has normal function. The left ventricle has no regional  wall motion abnormalities. Left ventricular diastolic parameters are  indeterminate.   2. Right ventricular systolic function is normal. The right ventricular  size is normal. Tricuspid regurgitation signal is inadequate for assessing  PA pressure.   3. The mitral valve is normal in structure. No evidence of mitral valve  regurgitation. No evidence of mitral stenosis.   4. The aortic valve is tricuspid. Aortic valve regurgitation is not  visualized. No aortic stenosis is present.   Assessment & Plan   1.  Paroxysmal atrial fibrillation with intermittent RVR, CHA2DS2-VASc score is 4.  Rhythm currently controlled on combination of oral amiodarone  and Lopressor .  Not anticoagulated in the setting of recent hemorrhagic shock requiring PRBC transfusions.  2.  Fluid overload/third spacing in the setting of hypoalbuminemia.  Receiving Lasix  per primary team.  Echocardiogram in April revealed LVEF 65 to 70% and normal RV contraction.  Approximately 1200 cc  net output more than intake last 24 hours.  Continue amiodarone  200 mg p.o. twice daily and Lopressor  100 mg p.o. twice daily.  Not anticoagulated at this point as discussed above.  Check ECG a.m.  For questions or updates, please contact [facility] Please consult [email or web address] for contact info under   Signed, Teddie Favre, MD  10/25/2023, 8:30 AM

## 2023-10-25 NOTE — Plan of Care (Signed)

## 2023-10-25 NOTE — Progress Notes (Signed)
 4 Days Post-Op  Subjective: Patient has no new complaints.  Tolerating soft diet well.  No emesis.  Objective: Vital signs in last 24 hours: Temp:  [97.9 F (36.6 C)-98 F (36.7 C)] 98 F (36.7 C) (05/27 0753) Pulse Rate:  [64-86] 64 (05/27 0630) Resp:  [15-25] 17 (05/27 0630) BP: (117-154)/(53-114) 139/72 (05/27 0630) SpO2:  [97 %-100 %] 97 % (05/27 0630) Weight:  [90.9 kg] 90.9 kg (05/27 0500) Last BM Date : 10/24/23  Intake/Output from previous day: 05/26 0701 - 05/27 0700 In: 30 [I.V.:30] Out: 1280 [Urine:1250; Drains:30] Intake/Output this shift: No intake/output data recorded.  General appearance: alert, cooperative, and no distress Resp: clear to auscultation bilaterally Cardio: regular rate and rhythm, S1, S2 normal, no murmur, click, rub or gallop GI: Soft, incision healing well.  Ostomy pink and patent.  JP drainage minimal with serous drainage.  Lab Results:  Recent Labs    10/24/23 0312 10/25/23 0446  WBC 6.3 6.3  HGB 11.9* 11.5*  HCT 35.7* 34.7*  PLT 115* 134*   BMET Recent Labs    10/24/23 0312 10/25/23 0446  NA 131* 132*  K 4.5 4.3  CL 98 99  CO2 26 28  GLUCOSE 129* 80  BUN 54* 51*  CREATININE 1.04* 1.12*  CALCIUM 8.3* 8.3*   PT/INR Recent Labs    10/22/23 0836  LABPROT 13.5  INR 1.0    Studies/Results: No results found.  Anti-infectives: Anti-infectives (From admission, onward)    Start     Dose/Rate Route Frequency Ordered Stop   10/22/23 1000  fluconazole  (DIFLUCAN ) IVPB 200 mg  Status:  Discontinued        200 mg 100 mL/hr over 60 Minutes Intravenous Every 24 hours 10/21/23 0857 10/21/23 0913   10/22/23 0930  fluconazole  (DIFLUCAN ) IVPB 200 mg  Status:  Discontinued       Placed in "Followed by" Linked Group   200 mg 100 mL/hr over 60 Minutes Intravenous Every 24 hours 10/21/23 0831 10/21/23 0840   10/21/23 1130  fluconazole  (DIFLUCAN ) IVPB 400 mg  Status:  Discontinued       Placed in "Followed by" Linked Group   400  mg 100 mL/hr over 120 Minutes Intravenous  Once 10/21/23 0840 10/21/23 0945   10/21/23 1100  micafungin  (MYCAMINE ) 100 mg in sodium chloride  0.9 % 100 mL IVPB        100 mg 105 mL/hr over 1 Hours Intravenous Every 24 hours 10/21/23 0945     10/21/23 0930  fluconazole  (DIFLUCAN ) IVPB 800 mg  Status:  Discontinued       Placed in "Followed by" Linked Group   800 mg 100 mL/hr over 240 Minutes Intravenous  Once 10/21/23 0831 10/21/23 0840   10/21/23 0900  fluconazole  (DIFLUCAN ) IVPB 400 mg  Status:  Discontinued       Placed in "Followed by" Linked Group   400 mg 100 mL/hr over 120 Minutes Intravenous  Once 10/21/23 0840 10/21/23 0945   10/13/23 1200  cefoTEtan  (CEFOTAN ) 2 g in sodium chloride  0.9 % 100 mL IVPB        2 g 200 mL/hr over 30 Minutes Intravenous On call to O.R. 10/12/23 0836 10/13/23 1401   10/13/23 1128  sodium chloride  0.9 % with cefoTEtan  (CEFOTAN ) ADS Med       Note to Pharmacy: Gabino Joe S: cabinet override      10/13/23 1128 10/13/23 1357   10/12/23 1300  metroNIDAZOLE  (FLAGYL ) tablet 500 mg  500 mg Oral 3 times daily 10/11/23 1212 10/12/23 2116   10/12/23 1300  neomycin  (MYCIFRADIN ) tablet 1,000 mg        1,000 mg Oral 3 times daily 10/11/23 1212 10/12/23 2136   10/11/23 1300  neomycin  (MYCIFRADIN ) tablet 1,000 mg  Status:  Discontinued        1,000 mg Oral 3 times daily 10/11/23 0839 10/11/23 1212   10/11/23 1300  metroNIDAZOLE  (FLAGYL ) tablet 500 mg  Status:  Discontinued        500 mg Oral 3 times daily 10/11/23 0839 10/11/23 1212   10/07/23 1500  piperacillin -tazobactam (ZOSYN ) IVPB 3.375 g        3.375 g 12.5 mL/hr over 240 Minutes Intravenous Every 8 hours 10/07/23 1356 10/19/23 1400       Assessment/Plan: Impression: Postoperative day 12.  Hemoglobin stable.  Patient continues to be in normal sinus rhythm.  Have started soft diet.  She is off TPN.  Patient is ready for transfer to a skilled nursing unit when available.  Would continue antifungal  medication for a total of 2 weeks as this was a deep abdominal culture.  Discussed with Dr. Lincoln Renshaw.  LOS: 18 days    [name] 10/25/2023

## 2023-10-25 NOTE — Plan of Care (Signed)

## 2023-10-26 DIAGNOSIS — N179 Acute kidney failure, unspecified: Secondary | ICD-10-CM | POA: Diagnosis not present

## 2023-10-26 DIAGNOSIS — K5720 Diverticulitis of large intestine with perforation and abscess without bleeding: Secondary | ICD-10-CM | POA: Diagnosis not present

## 2023-10-26 DIAGNOSIS — N1831 Chronic kidney disease, stage 3a: Secondary | ICD-10-CM

## 2023-10-26 LAB — CBC
HCT: 35.5 % — ABNORMAL LOW (ref 36.0–46.0)
Hemoglobin: 11.6 g/dL — ABNORMAL LOW (ref 12.0–15.0)
MCH: 29.7 pg (ref 26.0–34.0)
MCHC: 32.7 g/dL (ref 30.0–36.0)
MCV: 91 fL (ref 80.0–100.0)
Platelets: 151 10*3/uL (ref 150–400)
RBC: 3.9 MIL/uL (ref 3.87–5.11)
RDW: 18.2 % — ABNORMAL HIGH (ref 11.5–15.5)
WBC: 5.3 10*3/uL (ref 4.0–10.5)
nRBC: 0 % (ref 0.0–0.2)

## 2023-10-26 LAB — BASIC METABOLIC PANEL WITH GFR
Anion gap: 6 (ref 5–15)
BUN: 47 mg/dL — ABNORMAL HIGH (ref 8–23)
CO2: 28 mmol/L (ref 22–32)
Calcium: 8.4 mg/dL — ABNORMAL LOW (ref 8.9–10.3)
Chloride: 101 mmol/L (ref 98–111)
Creatinine, Ser: 1.28 mg/dL — ABNORMAL HIGH (ref 0.44–1.00)
GFR, Estimated: 43 mL/min — ABNORMAL LOW (ref >60)
Glucose, Bld: 79 mg/dL (ref 70–99)
Potassium: 4.7 mmol/L (ref 3.5–5.1)
Sodium: 135 mmol/L (ref 135–145)

## 2023-10-26 LAB — MAGNESIUM: Magnesium: 2.1 mg/dL (ref 1.7–2.4)

## 2023-10-26 MED ORDER — FUROSEMIDE 10 MG/ML IJ SOLN: 20.0000 mg | Freq: Every day | INTRAMUSCULAR | Status: DC

## 2023-10-26 MED ORDER — SODIUM CHLORIDE 0.9 % IV SOLN
100.0000 mg | INTRAVENOUS | Status: DC
  Administered 2023-10-26: 100 mg via INTRAVENOUS
  Filled 2023-10-26 (×3): qty 5

## 2023-10-26 MED ORDER — FUROSEMIDE 40 MG PO TABS
40.0000 mg | ORAL_TABLET | Freq: Once | ORAL | Status: AC
  Administered 2023-10-26: 40 mg via ORAL
  Filled 2023-10-26: qty 1

## 2023-10-26 MED ORDER — POTASSIUM CHLORIDE CRYS ER 20 MEQ PO TBCR
40.0000 meq | EXTENDED_RELEASE_TABLET | Freq: Every day | ORAL | Status: DC
  Administered 2023-10-26: 40 meq via ORAL
  Filled 2023-10-26: qty 2

## 2023-10-26 NOTE — Progress Notes (Addendum)
 PROGRESS NOTE  Felicia Frank ZOX:[id number] DOB: [birth year] DOA: 10/07/2023 PCP: Allana Ishikawa, MD  Brief History:  78 y.o. female with medical history significant of hypothyroidism, hypertension, chronic kidney disease stage IIIa, class I obesity, atrial fibrillation and recent hospitalization secondary to diverticulitis with perforation and contained abscess status post drain placement and enteral fistula formation; who presented to the hospital secondary to ongoing abdominal pain, associated nausea/intermittent vomiting and difficulty keeping things down.  Patient has been admitted with diverticulitis of the colon with perforation and enterocolonic fistula.  She is status post Hartman's procedure with small bowel resection and ileocecectomy on 5/15.  Patient remains on TPN and Lasix  and continues to have some elevated heart rates requiring recurrent amiodarone  bolus and ongoing drip as well as IV metoprolol .  Surgery team following and Cardiology consulted for assistance in management as patient remains on IV amiodarone  infusion.      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 - Completed IV antibiotics with Zosyn  for 5 days postoperatively thru 5/21 - Appreciate general surgery recommendations to advance to clear liquid diet and remove NG tube and continue TPN until tolerating solid food. - Started on Lasix  5/16 and -5.5 L fluid balance noted in the last 24 hours with stable creatinine levels with - restarted scheduled IV  lasix  on 5/25 - Discontinued Foley catheter  - tolerating diet now   Rare Candida species seen in intraabdominal deep wound culture - continue micafungin - discussed with Dr. Larrie Po, planning for total 2 week course of antifungal therapy for treatment  - cannot use azoles due to interaction with amiodarone  - d/c to SNF with 7 more days micafungin  Paroxysmal atrial fibrillation with intermittent RVR,  - continue amiodarone  (previously on IV amio earlier in admission) -Not anticoagulated in the setting of recent hemorrhagic shock requiring PRBC transfusions.    Hypertriglyceridemia - triglycerides 751 from TNA and oral intake - called and discussed with pharm D to remove lipids from TNA - TNA was discontinued on 10/24/23   Leukocytosis - resolved  - WBC now down to normal   - added antifungal coverage on 5/23 with improvement in WBC   Severe hypoalbuminemia with 3rd spacing  - IV albumin  25 gram every 6 hours x 3 doses ordered 5/23  - due to severe edema in upper arms, venous doppler US  negative for DVT  - restarted scheduled IV lasix  20 mg every 8 hours on 5/25  -transition to po lasix  am 5/29 - 09/21/23 echo--EF 65-70%, no WMA, normal RVF  Acute on chronic renal failure--CKD 3 a -baseline creatinine 1.1-1.4 -due to volume depletion and hemodynamic changes  essential hypertension - now off pressor support and shock has resolved - currently on metoprolol  tartrate 100 mg BID   hypothyroidism - Continue Synthroid . - Taking p.o. daily with no difficulty   acute kidney injury in the setting of chronic kidney disease stage IIIa - Creatinine improved to stabilized with diuresis - following BMP   GERD - Continue PPI   compression fracture of L1 -  Continue as needed analgesia - Will recommend continued use of TLSO brace at discharge.   class I obesity -Body mass index is 32.28 kg/m. - Low-calorie diet and portion control discussed with patient.  Family Communication:    no Family at bedside  Consultants:  general surgery cardiology  Code Status:  FULL   DVT Prophylaxis:  SCDs   Procedures: As Listed in Progress Note Above  Antibiotics: Micafungin 5/23>>     Subjective:  Patient denies fevers, chills, headache, chest pain, dyspnea, nausea, vomiting, diarrhea, abdominal pain, dysuria, hematuria, hematochezia, and melena.  Objective: Vitals:   10/26/23 1241 10/26/23 1300 10/26/23 1400 10/26/23 1449  BP: 135/69 134/81 126/60 132/76  Pulse:   63 60  Resp: 17 16 17 16   Temp:    (!) 97.4 F (36.3 C)  TempSrc:    Oral  SpO2:  99% 99% 99%  Weight:      Height:        Intake/Output Summary (Last 24 hours) at 10/26/2023 1718 Last data filed at 10/26/2023 7829 Gross per 24 hour  Intake 250 ml  Output 1450 ml  Net -1200 ml   Weight change:  Exam:  General:  Pt is alert, follows commands appropriately, not in acute distress HEENT: No icterus, No thrush, No neck mass, [place]/AT Cardiovascular: RRR, S1/S2, no rubs, no gallops Respiratory: bibasilar rales.  No wheeze Abdomen: Soft/+BS, non tender, non distended, no guarding Extremities: 1 + LE edema, No lymphangitis, No petechiae, No rashes, no synovitis   Data Reviewed: I have personally reviewed following labs and imaging studies Basic Metabolic Panel: Recent Labs  Lab 10/20/23 0425 10/21/23 0450 10/22/23 0311 10/23/23 0324 10/24/23 0312 10/25/23 0446 10/26/23 0435  NA 132* 129* 129* 135 131* 132* 135  K 4.5 5.1 3.9 3.2* 4.5 4.3 4.7  CL 104 105 103 99 98 99 101  CO2 22 19* 20* 21* 26 28 28   GLUCOSE 138* 143* 111* 98 129* 80 79  BUN 55* 60* 61* 61* 54* 51* 47*  CREATININE 1.10* 1.24* 1.27* 1.12* 1.04* 1.12* 1.28*  CALCIUM 7.3* 7.5* 8.1* 8.1* 8.3* 8.3* 8.4*  MG 1.9 1.9  --   --  1.8 1.8 2.1  PHOS 3.4 3.6  --   --  3.6 3.9  --    Liver Function Tests: Recent Labs  Lab 10/20/23 0425 10/22/23 0311 10/23/23 0324 10/24/23 0312 10/25/23 0446  AST 23 33 34 34 31  ALT 22 40 46*  52* 46*  ALKPHOS 35* 38 51 78 72  BILITOT 0.5 0.8 0.8 0.5 0.8  PROT 3.5* 4.6* 4.4* 4.9* 4.6*  ALBUMIN  1.6* 3.1* 2.6* 2.6* 2.4*   No results for input(s): "LIPASE", "AMYLASE" in the last 168 hours. No results for input(s): "AMMONIA" in the last 168 hours. Coagulation Profile: Recent Labs  Lab 10/22/23 0836  INR 1.0   CBC: Recent Labs  Lab 10/22/23 0311 10/22/23 0836 10/22/23 1957 10/23/23 0324 10/24/23 0312 10/25/23 0446 10/26/23 0435  WBC 14.6*  --   --  6.4 6.3 6.3 5.3  NEUTROABS 9.9*  --   --  4.9 4.9 4.3  --   HGB 7.8*   < > 11.4* 11.2* 11.9* 11.5* 11.6*  HCT 22.9*   < > 32.8* 31.5* 35.7* 34.7* 35.5*  MCV 88.4  --   --  86.3 98.1 90.4 91.0  PLT 105*  --   --  108* 115* 134* 151   < > = values in this interval not displayed.  Cardiac Enzymes: No results for input(s): "CKTOTAL", "CKMB", "CKMBINDEX", "TROPONINI" in the last 168 hours. BNP: Invalid input(s): "POCBNP" CBG: Recent Labs  Lab 10/23/23 0547 10/23/23 1419 10/23/23 2116 10/24/23 0511 10/24/23 1152  GLUCAP 119* 140* 118* 133* 114*   HbA1C: No results for input(s): "HGBA1C" in the last 72 hours. Urine analysis:    Component Value Date/Time   COLORURINE YELLOW 10/07/2023 1238   APPEARANCEUR CLEAR 10/07/2023 1238   LABSPEC 1.021 10/07/2023 1238   PHURINE 5.0 10/07/2023 1238   GLUCOSEU NEGATIVE 10/07/2023 1238   HGBUR MODERATE (A) 10/07/2023 1238   BILIRUBINUR NEGATIVE 10/07/2023 1238   KETONESUR 20 (A) 10/07/2023 1238   PROTEINUR NEGATIVE 10/07/2023 1238   NITRITE NEGATIVE 10/07/2023 1238   LEUKOCYTESUR NEGATIVE 10/07/2023 1238   Sepsis Labs: @LABRCNTIP (procalcitonin:4,lacticidven:4) )No results found for this or any previous visit (from the past 240 hours).   Scheduled Meds:  acetaminophen   1,000 mg Oral Q6H   amiodarone   200 mg Oral BID   Chlorhexidine  Gluconate Cloth  6 each Topical Daily   [START ON 10/27/2023] furosemide   20 mg Intravenous Daily   levothyroxine   50 mcg Oral Daily    metoprolol  tartrate  100 mg Oral BID   pantoprazole   40 mg Oral BID   sodium chloride  flush  10-40 mL Intracatheter Q12H   Continuous Infusions:  micafungin (MYCAMINE) 100 mg in sodium chloride  0.9 % 100 mL IVPB 100 mg (10/26/23 1237)    Procedures/Studies: US  Venous Img Upper Bilat (DVT) Result Date: 10/21/2023 CLINICAL DATA:  Bilateral arm pain EXAM: BILATERAL UPPER EXTREMITY VENOUS DOPPLER ULTRASOUND TECHNIQUE: Gray-scale sonography with graded compression, as well as color Doppler and duplex ultrasound were performed to evaluate the bilateral upper extremity deep venous systems from the level of the subclavian vein and including the jugular, axillary, basilic, radial, ulnar and upper cephalic vein. Spectral Doppler was utilized to evaluate flow at rest and with distal augmentation maneuvers. COMPARISON:  None Available. FINDINGS: RIGHT UPPER EXTREMITY Internal Jugular Vein: No evidence of thrombus. Normal compressibility, respiratory phasicity and response to augmentation. Subclavian Vein: No evidence of thrombus. Normal compressibility, respiratory phasicity and response to augmentation. Axillary Vein: No evidence of thrombus. Normal compressibility, respiratory phasicity and response to augmentation. Cephalic Vein: No evidence of thrombus. Normal compressibility, respiratory phasicity and response to augmentation. Basilic Vein: Poorly seen with overlapping bandages and soft tissue. Brachial Veins: No evidence of thrombus. Normal compressibility, respiratory phasicity and response to augmentation. Radial Veins: Incompletely evaluated due to overlapping bandages. Ulnar Veins: Poorly seen with overlapping bandages. Venous Reflux:  None. Other Findings:  None. LEFT UPPER EXTREMITY Internal Jugular Vein: No evidence of thrombus. Normal compressibility, respiratory phasicity and response to augmentation. Subclavian Vein: No evidence of thrombus. Normal compressibility, respiratory phasicity and response to  augmentation. Axillary Vein: No evidence of thrombus. Normal compressibility, respiratory phasicity and response to augmentation. Cephalic Vein: No evidence of thrombus. Normal compressibility, respiratory phasicity and response to augmentation. Basilic Vein: No evidence of thrombus. Normal compressibility, respiratory phasicity and response to augmentation. Brachial Veins: No evidence of thrombus. Normal compressibility, respiratory phasicity and response to augmentation. Radial Veins: No evidence of thrombus. Normal compressibility, respiratory phasicity and response to augmentation. Ulnar Veins: No evidence of thrombus. Normal compressibility, respiratory phasicity and response to augmentation. Venous Reflux:  None. Other Findings:  None. IMPRESSION: No evidence of DVT within either upper extremity. Some limited evaluation of the right basilic vein and right radial and ulnar veins. Electronically Signed   By: Otho Blitz.D.  On: 10/21/2023 12:58   CT ANGIO GI BLEED Result Date: 10/19/2023 CLINICAL DATA:  Lower gastrointestinal hemorrhage EXAM: CTA ABDOMEN AND PELVIS WITHOUT AND WITH CONTRAST TECHNIQUE: Multidetector CT imaging of the abdomen and pelvis was performed using the standard protocol during bolus administration of intravenous contrast. Multiplanar reconstructed images and MIPs were obtained and reviewed to evaluate the vascular anatomy. RADIATION DOSE REDUCTION: This exam was performed according to the departmental dose-optimization program which includes automated exposure control, adjustment of the mA and/or kV according to patient size and/or use of iterative reconstruction technique. CONTRAST:  OMNIPAQUE  IOHEXOL  350 MG/ML SOLN COMPARISON:  None Available. FINDINGS: VASCULAR Aorta: Normal caliber aorta without aneurysm, dissection, vasculitis or significant stenosis. Moderate atherosclerotic calcification. Celiac: Patent without evidence of aneurysm, dissection, vasculitis or significant  stenosis. SMA: Patent without evidence of aneurysm, dissection, vasculitis or significant stenosis. Renals: Main renal arteries bilaterally are relatively diminutive. Right main renal artery is widely patent. 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 Diminutive accessory renal arteries are seen bilaterally. Normal vascular morphology. No aneurysm or dissection. IMA: Patent without evidence of aneurysm, dissection, vasculitis or significant stenosis. Inflow: Patent without evidence of aneurysm, dissection, vasculitis or significant stenosis. Proximal Outflow: Bilateral common femoral and visualized portions of the superficial and profunda femoral arteries are patent without evidence of aneurysm, dissection, vasculitis or significant stenosis. Veins: Unremarkable Review of the MIP images confirms the above findings. NON-VASCULAR Lower chest: Small bilateral pleural effusions. Cardiac size within normal limits. Small hiatal hernia. Hepatobiliary: No focal liver abnormality is seen. Status post cholecystectomy. No biliary dilatation. Pancreas: Unremarkable Spleen: Unremarkable Adrenals/Urinary Tract: Adrenal glands are unremarkable. Simple exophytic cortical cyst is seen within the lower pole the right kidney for which no follow-up imaging is recommended. The kidneys are otherwise unremarkable. Bladder unremarkable. Stomach/Bowel: Partial small bowel and large bowel resection are seen with small bowel anastomosis within the left lower quadrant and enterocolic anastomosis within the right lower quadrant. 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 There is high density intraluminal enteric contents within the small bowel in the region of the small bowel anastomosis compatible with blood product resulting in a partial small bowel obstruction, however, there is no active gastrointestinal hemorrhage identified. Surgical  drainage catheter seen within the deep pelvis. No free intraperitoneal gas or fluid. No loculated intra-abdominal fluid collections. Infiltration within the right lower quadrant small bowel mesentery as well as extraluminal retroperitoneal gas within left lower quadrant is likely postsurgical in nature. Punctate foci of gas within the laparotomy incision within the anterior abdominal wall are nonspecific. Lymphatic: No pathologic adenopathy within the abdomen and pelvis. Reproductive: Status post hysterectomy. No adnexal masses. Other: No abdominal wall hernia Musculoskeletal: Stable subacute appearing superior endplate fracture of L1. No acute bone abnormality. No lytic or blastic bone lesion. Osseous structures are age appropriate. IMPRESSION: 1. No active gastrointestinal hemorrhage identified. 2. Intraluminal blood product within the small bowel in the region of the small bowel anastomosis resulting in a partial small bowel obstruction. 3. Surgical changes of partial small and large bowel resection with descending colostomy and Hartmann pouch formation. 4. Stable subacute appearing superior endplate fracture of L1. Aortic Atherosclerosis (ICD10-I70.0). Electronically Signed   By: Worthy Heads M.D.   On: 10/19/2023 04:24   DG CHEST PORT 1 VIEW Result Date: 10/09/2023 CLINICAL DATA:  [id number] PICC (peripherally inserted central catheter) in place [id number] EXAM: PORTABLE CHEST 1 VIEW COMPARISON:  Chest x-ray 09/20/2023 FINDINGS: Right  PICC with tip overlying the right atrium, likely in appropriate position along the superior cavoatrial junction given low lung volumes. The heart and mediastinal contours are unchanged. Low lung volumes. No focal consolidation. No pulmonary edema. No pleural effusion. No pneumothorax. No acute osseous abnormality. IMPRESSION: 1. Right PICC with tip overlying the right atrium, likely in appropriate position along the superior cavoatrial junction given low lung volumes. 2. Low lung  volumes. Electronically Signed   By: Morgane  Naveau M.D.   On: 10/09/2023 18:08   DG Abd 1 View Result Date: 10/09/2023 CLINICAL DATA:  Abdominal distension EXAM: ABDOMEN - 1 VIEW COMPARISON:  CT abdomen pelvis 10/07/2023 FINDINGS: Diffuse gaseous distension of the colon and small bowel. No dilated small bowel loops IMPRESSION: Diffuse gaseous distension of the colon and small bowel, likely ileus. Electronically Signed   By: Juanetta Nordmann M.D.   On: 10/09/2023 11:33   US  EKG SITE RITE Result Date: 10/09/2023 If Site Rite image not attached, placement could not be confirmed due to current cardiac rhythm.  CT ABDOMEN PELVIS WO CONTRAST Result Date: 10/07/2023 CLINICAL DATA:  Diverticulitis EXAM: CT ABDOMEN AND PELVIS WITHOUT CONTRAST TECHNIQUE: Multidetector CT imaging of the abdomen and pelvis was performed following the standard protocol without IV contrast. RADIATION DOSE REDUCTION: This exam was performed according to the departmental dose-optimization program which includes automated exposure control, adjustment of the mA and/or kV according to patient size and/or use of iterative reconstruction technique. COMPARISON:  CT of the abdomen and pelvis performed September 26, 2023 FINDINGS: Lower chest: Trace pleural effusions. Hepatobiliary: No focal liver lesion. The gallbladder surgically absent. Pancreas: Unremarkable. No pancreatic ductal dilatation or surrounding inflammatory changes. Spleen: Normal in size without focal abnormality. Adrenals/Urinary Tract: Persistent nephrograms. Right lower pole renal cyst. No hydronephrosis. Residual contrast agent is present within the urinary bladder. Stomach/Bowel: Small hiatal hernia. Stomach is nondilated. The proximal small bowel is decompressed. Within the mid small bowel, there is mild diffuse gaseous distension. There is a transition from gaseous distension to decompressed in the central pelvis which is annotated on image 70 of series 2. Beyond this level, the  small-bowel D comes decompressed and small in caliber. The terminal ileum is nondilated. Liquid and contrast containing stool material is than present within the right and transverse colon. This is moderate to large in volume. Contrast containing stool material is present in the distal colon. The rectum is largely decompressed. There is a small central air and fluid containing collection which is best seen on image 66 of series 2 which is estimated at 3.9 x 3.6 cm. This is adjacent to the prior catheter site (and previously contained air). Vascular/Lymphatic: Not significantly changed. Reproductive: Unchanged Other: Diffuse anasarca. Musculoskeletal: Degenerative changes in the imaged osseous structures, stable. IMPRESSION: 1. Mild diffuse gaseous distension of central loops of small bowel with a transition point in the pelvis, just proximal to the terminal ileum suggestive of partial obstruction. 2. There is a small air and fluid containing collection in the central pelvis, adjacent to the previous drainage catheter, which measures 3.9 x 3.6 cm. This is not readily amenable to percutaneous access secondary to the central location and absence of access window. 3. A large volume of liquid stool material is present in the right and transverse colon. 4. Persistent nephrograms which can be observed in the setting of renal dysfunction. Electronically Signed   By: Reagan Camera M.D.   On: 10/07/2023 13:07    Demaris Fillers, DO  [facility]  If  7PM-7AM, please contact night-coverage [email or web address] Password [email or web address] 10/26/2023, 5:18 PM   LOS: 19 days

## 2023-10-26 NOTE — Plan of Care (Signed)

## 2023-10-26 NOTE — Progress Notes (Signed)
 Progress Note  Patient Name: Felicia Frank Date of Encounter: 10/26/2023  Primary Cardiologist: Armida Lander, MD  Interval Summary   Interval chart reviewed.  Reports no chest pain or palpitations this morning.  Net urine output of approximately 1900 cc last 24 hours.  Creatinine has bumped up to 1.28 from 1.12.  Did get up some with assistance yesterday.  Vital Signs    Vitals:   10/25/23 2052 10/26/23 0100 10/26/23 0500 10/26/23 0752  BP:      Pulse:      Resp:      Temp: (!) 97.3 F (36.3 C) 98 F (36.7 C) (!) 96.8 F (36 C) 97.9 F (36.6 C)  TempSrc: Oral Oral Axillary Oral  SpO2:      Weight:      Height:        Intake/Output Summary (Last 24 hours) at 10/26/2023 0849 Last data filed at 10/26/2023 0752 Gross per 24 hour  Intake 480 ml  Output 2050 ml  Net -1570 ml   Filed Weights   10/23/23 0500 10/24/23 0500 10/25/23 0500  Weight: 91 kg 91.8 kg 90.9 kg    Physical Exam   GEN: No acute distress.   Neck: No JVD. Cardiac: RRR, no murmur, rub, or gallop.  Respiratory: Nonlabored. Clear to auscultation bilaterally. MS: Peripheral edema, soft.  ECG/Telemetry    Telemetry reviewed showing sinus rhythm.  Follow-up ECG pending.  Labs    Chemistry Recent Labs  Lab 10/23/23 0324 10/24/23 0312 10/25/23 0446 10/26/23 0435  NA 135 131* 132* 135  K 3.2* 4.5 4.3 4.7  CL 99 98 99 101  CO2 21* 26 28 28   GLUCOSE 98 129* 80 79  BUN 61* 54* 51* 47*  CREATININE 1.12* 1.04* 1.12* 1.28*  CALCIUM 8.1* 8.3* 8.3* 8.4*  PROT 4.4* 4.9* 4.6*  --   ALBUMIN  2.6* 2.6* 2.4*  --   AST 34 34 31  --   ALT 46* 52* 46*  --   ALKPHOS 51 78 72  --   BILITOT 0.8 0.5 0.8  --   GFRNONAA 50* 55* 50* 43*  ANIONGAP 15 7 5 6     Hematology Recent Labs  Lab 10/24/23 0312 10/25/23 0446 10/26/23 0435  WBC 6.3 6.3 5.3  RBC 3.64* 3.84* 3.90  HGB 11.9* 11.5* 11.6*  HCT 35.7* 34.7* 35.5*  MCV 98.1 90.4 91.0  MCH 32.7 29.9 29.7  MCHC 33.3 33.1 32.7  RDW 19.3* 18.5*  18.2*  PLT 115* 134* 151    Cardiac Studies   Echocardiogram 09/21/2023:  1. Left ventricular ejection fraction, by estimation, is 65 to 70%. The  left ventricle has normal function. The left ventricle has no regional  wall motion abnormalities. Left ventricular diastolic parameters are  indeterminate.   2. Right ventricular systolic function is normal. The right ventricular  size is normal. Tricuspid regurgitation signal is inadequate for assessing  PA pressure.   3. The mitral valve is normal in structure. No evidence of mitral valve  regurgitation. No evidence of mitral stenosis.   4. The aortic valve is tricuspid. Aortic valve regurgitation is not  visualized. No aortic stenosis is present.   Assessment & Plan   1.  Paroxysmal atrial fibrillation with intermittent RVR, CHA2DS2-VASc score is 4.  Not anticoagulated in the setting of recent hemorrhagic shock requiring PRBC transfusions.  Maintaining sinus rhythm on amiodarone  200 mg twice daily (oral regimen started on May 25) and Lopressor  100 mg twice daily.  2.  Fluid  overload/third spacing in the setting of hypoalbuminemia.  Echocardiogram in April revealed LVEF 65 to 70% and normal RV contraction.  Diuresing with general improvement in edema, although creatinine is starting to bump up somewhat.  Continue current doses of amiodarone  and Lopressor .  Decrease Lasix  to once daily and reduce potassium supplement.  We are continuing to follow as patient recuperates from other comorbidities.  For questions or updates, please contact [facility] Please consult [email or web address] for contact info under   Signed, Teddie Favre, MD  10/26/2023, 8:49 AM

## 2023-10-26 NOTE — Progress Notes (Signed)
 Physical Therapy Treatment Patient Details Name: Felicia Frank MRN: [id number] DOB: 1946/02/23 Today's Date: 10/26/2023   History of Present Illness Felicia Frank is a 78 y.o. 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     Workup in the ED demonstrating elevated WBCs and abnormal CT scan with concern for enteral fistula formation, contained abscess and diverticulitis.  Unfortunately given location of this new contained abscess no ability or safe margin for placement percutaneous drain.     Case was discussed with general surgery who recommended admission for IV antibiotics and anticipated surgical intervention on 10/10/2023.    PT Comments  Patient continues to demonstrate LE swelling, decreased LE strength, decreased gait quality and balance. Patient also demonstrates decreased endurance with ambulation although pt able to perform standing marches after ambulation before sitting down. Pt continues to demonstrate decreased quality of gait pattern due to decreased LE strength and balance, RW and mod assist required. Pt requires mod assist for bed mobility and min assist for STS transfer. Patient would continue to benefit from acute skilled physical therapy for increased endurance with ambulation, increased LE strength, and improved balance for improved quality of life, improved independence with gait training and continued progress towards therapy goals.     If plan is discharge home,  recommend the following: A lot of help with bathing/dressing/bathroom;A lot of help with walking and/or transfers;Help with stairs or ramp for entrance;Assistance with cooking/housework   Can travel by private vehicle     No  Equipment Recommendations  None recommended by PT    Recommendations for Other Services       Precautions / Restrictions Precautions Precautions: Fall Recall of Precautions/Restrictions: Intact Restrictions Weight Bearing Restrictions Per Provider Order: No     Mobility  Bed Mobility Overal bed mobility: Needs Assistance Bed Mobility: Supine to Sit Rolling: Mod assist, Min assist Sidelying to sit: Mod assist Supine to sit: Mod assist   Sit to sidelying: Mod assist General bed mobility comments: slow labored movement, required assistance with LEs due to weakness    Transfers Overall transfer level: Needs assistance Equipment used: Rolling walker (2 wheels) Transfers: Sit to/from Stand Sit to Stand: Min assist   Step pivot transfers: Min assist, Mod assist       General transfer comment: increased time, labored movment, cueing for RW managment    Ambulation/Gait Ambulation/Gait assistance: Min assist, Mod assist Gait Distance (Feet): 8 Feet Assistive device: Rolling walker (2 wheels) Gait Pattern/deviations: Decreased step length - right, Decreased step length - left, Decreased stride length, Trunk flexed Gait velocity: decreased     General Gait Details: decreased step length bilaterally, decreased quality and decreased hip and knee flexion due to swelling and LE weakness.   Stairs             Wheelchair Mobility     Tilt Bed    Modified Rankin (Stroke Patients Only)       Balance Overall balance assessment: Needs assistance Sitting-balance support: Feet supported, No upper extremity supported Sitting balance-Leahy Scale: Fair Sitting balance - Comments: fair/good seated at EOB   Standing balance support: During  functional activity,  Bilateral upper extremity supported, Reliant on assistive device for balance Standing balance-Leahy Scale: Poor Standing balance comment: fair/poor using RW                            Communication Communication Communication: No apparent difficulties  Cognition Arousal: Alert Behavior During Therapy: WFL for tasks assessed/performed   PT - Cognitive impairments: No apparent impairments                         Following commands: Intact      Cueing Cueing Techniques: Verbal cues  Exercises General Exercises - Lower Extremity Long Arc Quad: Seated, AROM, Strengthening, Both, 10 reps Hip Flexion/Marching: Seated, AROM, Strengthening, Both, 10 reps Toe Raises: Seated, AROM, Strengthening, Both, 10 reps Heel Raises: Seated, AROM, Strengthening, Both, 10 reps    General Comments        Pertinent Vitals/Pain Pain Assessment Pain Assessment: No/denies pain    Home Living                          Prior Function            PT Goals (current goals can now be found in the care plan section) Acute Rehab PT Goals Patient Stated Goal: return home after rehab PT Goal Formulation: With patient/family Time For Goal Achievement: 11/09/23 Potential to Achieve Goals: Good Progress towards PT goals: Progressing toward goals    Frequency    Min 3X/week      PT Plan      Co-evaluation              AM-PAC PT "6 Clicks" Mobility   Outcome Measure  Help needed turning from your back to your side while in a flat bed without using bedrails?: A Lot Help needed moving from lying on your back to sitting on the side of a flat bed without using bedrails?: A Lot Help needed moving to and from a bed to a chair (including a wheelchair)?: A Lot Help needed standing up from a chair using your arms (e.g., wheelchair or bedside chair)?: A Little Help needed to walk in hospital room?: A Lot Help needed climbing 3-5 steps with a  railing? : A Lot 6 Click Score: 13    End of Session Equipment Utilized During Treatment: Gait belt Activity Tolerance: Patient tolerated treatment well;Patient limited by fatigue Patient left: in chair;with family/visitor present Nurse Communication: Mobility status;Precautions PT Visit Diagnosis: Unsteadiness on feet (R26.81);Other abnormalities of gait and mobility (R26.89);Muscle weakness (generalized) (M62.81)     Time: 1610-9604 PT Time Calculation (min) (ACUTE ONLY): 23 min  Charges:    $Therapeutic Exercise: 8-22 mins PT General Charges $$ ACUTE PT VISIT: 1 Visit                     Armond Bertin, PT, DPT [facility] Office: [phone] 11:52 AM, 10/26/23

## 2023-10-26 NOTE — Plan of Care (Signed)
  Problem: Acute Rehab PT Goals(only PT should resolve) Goal: Pt Will Go Supine/Side To Sit Outcome: Progressing Flowsheets (Taken 10/14/2023 1447 by Walton Guppy, PT) Pt will go Supine/Side to Sit: with minimal assist Goal: Pt Will Transfer Bed To Chair/Chair To Bed Outcome: Progressing Flowsheets (Taken 10/14/2023 1447 by Walton Guppy, PT) Pt will Transfer Bed to Chair/Chair to Bed:  with contact guard assist  with min assist Goal: Pt Will Ambulate Outcome: Progressing Flowsheets (Taken 10/14/2023 1447 by Walton Guppy, PT) Pt will Ambulate:  25 feet  with minimal assist  with rolling walker   Armond Bertin, PT, DPT [facility] Office: [phone] 11:54 AM, 10/26/23

## 2023-10-26 NOTE — TOC Progression Note (Signed)
 Transition of Care [facility]) - Progression Note    Patient Details  Name: Felicia Frank MRN: [id number] Date of Birth: 26-May-1946  [facility] CM/SW Contact  Grandville Lax, [place] Phone Number: 10/26/2023, 11:11 AM  Clinical Narrative:    CSW updated that pt will need micafungin  100 mg for 8 days while at [facility] facility. CSW spoke to [place] with [employer] who confirms they can get this for pt and will be able to have it in the building by tomorrow. CSW updated MD of this. Insurance [employer] has been approved at this time. TOC to follow.   Expected Discharge Plan: Home w Home Health Services Barriers to Discharge: Continued Medical Work up  Expected Discharge Plan and Services In-house Referral: Clinical Social Work Discharge Planning Services: CM Consult Post Acute Care Choice: Home Health Living arrangements for the past 2 months: Single Family Home                                       Social Determinants of Health (SDOH) Interventions SDOH Screenings   Food Insecurity: No Food Insecurity (10/08/2023)  Housing: Low Risk  (10/08/2023)  Transportation Needs: No Transportation Needs (10/08/2023)  Utilities: Not At Risk (10/08/2023)  Social Connections: Socially Integrated (10/08/2023)  Tobacco Use: Low Risk  (10/21/2023)    Readmission Risk Interventions    10/10/2023   11:26 AM 10/07/2023   10:06 PM 09/16/2023   10:04 PM  Readmission Risk Prevention Plan  Post Dischage Appt   Complete  Medication Screening   Complete  Transportation Screening Complete Complete Complete  PCP or Specialist Appt within 5-7 Days  Complete   Home Care Screening  Complete   Medication Review (RN CM)  Complete   HRI or Home Care Consult Complete    Social Work Consult for Recovery Care Planning/Counseling Complete    Palliative Care Screening Not Applicable    Medication Review Oceanographer) Complete

## 2023-10-26 NOTE — Progress Notes (Signed)
 5 Days Post-Op  Subjective: Tolerating soft diet well.  No new complaints.  Objective: Vital signs in last 24 hours: Temp:  [96.8 F (36 C)-98 F (36.7 C)] 97.9 F (36.6 C) (05/28 0752) Pulse Rate:  [62-68] 62 (05/28 0928) Resp:  [14-23] 15 (05/27 1800) BP: (111-151)/(38-90) 114/38 (05/28 0928) SpO2:  [98 %-99 %] 99 % (05/27 1700) Last BM Date : 10/24/23  Intake/Output from previous day: 05/27 0701 - 05/28 0700 In: 480 [P.O.:480] Out: 2350 [Urine:2350] Intake/Output this shift: Total I/O In: 250 [P.O.:240; I.V.:10] Out: 650 [Urine:650]  General appearance: alert, cooperative, and no distress GI: Soft, incision healing well.  Several staples removed.  Ostomy pink and patent with dark contents and gas present.  JP drain removed.  Lab Results:  Recent Labs    10/25/23 0446 10/26/23 0435  WBC 6.3 5.3  HGB 11.5* 11.6*  HCT 34.7* 35.5*  PLT 134* 151   BMET Recent Labs    10/25/23 0446 10/26/23 0435  NA 132* 135  K 4.3 4.7  CL 99 101  CO2 28 28  GLUCOSE 80 79  BUN 51* 47*  CREATININE 1.12* 1.28*  CALCIUM 8.3* 8.4*   PT/INR No results for input(s): "LABPROT", "INR" in the last 72 hours.  Studies/Results: No results found.  Anti-infectives: Anti-infectives (From admission, onward)    Start     Dose/Rate Route Frequency Ordered Stop   10/27/23 1000  fluconazole (DIFLUCAN) tablet 200 mg       Placed in "Followed by" Linked Group   200 mg Oral Daily 10/25/23 1134 11/04/23 0959   10/26/23 1000  fluconazole (DIFLUCAN) tablet 400 mg       Placed in "Followed by" Linked Group   400 mg Oral Daily 10/25/23 1134 10/26/23 0928   10/22/23 1000  fluconazole (DIFLUCAN) IVPB 200 mg  Status:  Discontinued        200 mg 100 mL/hr over 60 Minutes Intravenous Every 24 hours 10/21/23 0857 10/21/23 0913   10/22/23 0930  fluconazole (DIFLUCAN) IVPB 200 mg  Status:  Discontinued       Placed in "Followed by" Linked Group   200 mg 100 mL/hr over 60 Minutes Intravenous Every  24 hours 10/21/23 0831 10/21/23 0840   10/21/23 1130  fluconazole (DIFLUCAN) IVPB 400 mg  Status:  Discontinued       Placed in "Followed by" Linked Group   400 mg 100 mL/hr over 120 Minutes Intravenous  Once 10/21/23 0840 10/21/23 0945   10/21/23 1100  micafungin (MYCAMINE) 100 mg in sodium chloride  0.9 % 100 mL IVPB  Status:  Discontinued        100 mg 105 mL/hr over 1 Hours Intravenous Every 24 hours 10/21/23 0945 10/25/23 1134   10/21/23 0930  fluconazole (DIFLUCAN) IVPB 800 mg  Status:  Discontinued       Placed in "Followed by" Linked Group   800 mg 100 mL/hr over 240 Minutes Intravenous  Once 10/21/23 0831 10/21/23 0840   10/21/23 0900  fluconazole (DIFLUCAN) IVPB 400 mg  Status:  Discontinued       Placed in "Followed by" Linked Group   400 mg 100 mL/hr over 120 Minutes Intravenous  Once 10/21/23 0840 10/21/23 0945   10/13/23 1200  cefoTEtan  (CEFOTAN ) 2 g in sodium chloride  0.9 % 100 mL IVPB        2 g 200 mL/hr over 30 Minutes Intravenous On call to O.R. 10/12/23 0836 10/13/23 1401   10/13/23 1128  sodium chloride  0.9 %  with cefoTEtan  (CEFOTAN ) ADS Med       Note to Pharmacy: Gabino Joe S: cabinet override      10/13/23 1128 10/13/23 1357   10/12/23 1300  metroNIDAZOLE  (FLAGYL ) tablet 500 mg        500 mg Oral 3 times daily 10/11/23 1212 10/12/23 2116   10/12/23 1300  neomycin  (MYCIFRADIN ) tablet 1,000 mg        1,000 mg Oral 3 times daily 10/11/23 1212 10/12/23 2136   10/11/23 1300  neomycin  (MYCIFRADIN ) tablet 1,000 mg  Status:  Discontinued        1,000 mg Oral 3 times daily 10/11/23 0839 10/11/23 1212   10/11/23 1300  metroNIDAZOLE  (FLAGYL ) tablet 500 mg  Status:  Discontinued        500 mg Oral 3 times daily 10/11/23 0839 10/11/23 1212   10/07/23 1500  piperacillin -tazobactam (ZOSYN ) IVPB 3.375 g        3.375 g 12.5 mL/hr over 240 Minutes Intravenous Every 8 hours 10/07/23 1356 10/19/23 1400       Assessment/Plan: Impression: Postoperative day 13.  Hemoglobin  continues to be stable.  Patient is on an IV antifungal and cannot be switched to p.o. due to amiodarone  and cross-reactivity.  Discussed with Dr. Winferd Hatter.  He will contact ID to assess length of IV antifungal treatment.  She is otherwise ready for discharge to skilled nursing unit.  Staples will need to remain in for another week.  LOS: 19 days    [name] 10/26/2023

## 2023-10-27 DIAGNOSIS — N179 Acute kidney failure, unspecified: Secondary | ICD-10-CM | POA: Diagnosis not present

## 2023-10-27 DIAGNOSIS — D62 Acute posthemorrhagic anemia: Secondary | ICD-10-CM | POA: Diagnosis not present

## 2023-10-27 DIAGNOSIS — K5720 Diverticulitis of large intestine with perforation and abscess without bleeding: Secondary | ICD-10-CM | POA: Diagnosis not present

## 2023-10-27 LAB — BASIC METABOLIC PANEL WITH GFR
Anion gap: 10 (ref 5–15)
BUN: 45 mg/dL — ABNORMAL HIGH (ref 8–23)
CO2: 27 mmol/L (ref 22–32)
Calcium: 8.7 mg/dL — ABNORMAL LOW (ref 8.9–10.3)
Chloride: 97 mmol/L — ABNORMAL LOW (ref 98–111)
Creatinine, Ser: 1.29 mg/dL — ABNORMAL HIGH (ref 0.44–1.00)
GFR, Estimated: 42 mL/min — ABNORMAL LOW (ref >60)
Glucose, Bld: 99 mg/dL (ref 70–99)
Potassium: 4.2 mmol/L (ref 3.5–5.1)
Sodium: 134 mmol/L — ABNORMAL LOW (ref 135–145)

## 2023-10-27 LAB — MAGNESIUM: Magnesium: 1.8 mg/dL (ref 1.7–2.4)

## 2023-10-27 MED ORDER — FUROSEMIDE 40 MG PO TABS: 40.0000 mg | ORAL_TABLET | Freq: Every day | ORAL | Status: DC

## 2023-10-27 MED ORDER — SODIUM CHLORIDE 0.9 % IV SOLN: INTRAVENOUS | Status: DC

## 2023-10-27 MED ORDER — AMIODARONE HCL 200 MG PO TABS: 200.0000 mg | ORAL_TABLET | Freq: Two times a day (BID) | ORAL | Status: DC

## 2023-10-27 MED ORDER — METOPROLOL TARTRATE 100 MG PO TABS: 100.0000 mg | ORAL_TABLET | Freq: Two times a day (BID) | ORAL | Status: DC

## 2023-10-27 MED ORDER — FUROSEMIDE 20 MG PO TABS
20.0000 mg | ORAL_TABLET | Freq: Every day | ORAL | Status: DC
  Filled 2023-10-27: qty 1

## 2023-10-27 MED ORDER — FLUCONAZOLE 100 MG PO TABS
200.0000 mg | ORAL_TABLET | Freq: Once | ORAL | Status: AC
  Administered 2023-10-27: 200 mg via ORAL

## 2023-10-27 MED ORDER — ACETAMINOPHEN 500 MG PO TABS: 1000.0000 mg | ORAL_TABLET | Freq: Four times a day (QID) | ORAL | Status: DC

## 2023-10-27 MED ORDER — PANTOPRAZOLE SODIUM 40 MG PO TBEC: 40.0000 mg | DELAYED_RELEASE_TABLET | Freq: Every day | ORAL | Status: DC

## 2023-10-27 MED ORDER — FUROSEMIDE 20 MG PO TABS: 20.0000 mg | ORAL_TABLET | Freq: Every day | ORAL | Status: DC

## 2023-10-27 NOTE — Plan of Care (Signed)

## 2023-10-27 NOTE — Progress Notes (Signed)
 Progress Note  Patient Name: Felicia Frank Date of Encounter: 10/27/2023  Primary Cardiologist: Armida Lander, MD  Interval Summary   Denies CP   Breathing OK in bed   Still with edema    Vital Signs    Vitals:   10/26/23 1449 10/26/23 1814 10/26/23 2257 10/27/23 0325  BP: 132/76 124/70 129/68 135/63  Pulse: 60 66 64 69  Resp: 16 16 16 17   Temp: (!) 97.4 F (36.3 C)  97.8 F (36.6 C) 97.9 F (36.6 C)  TempSrc: Oral  Oral Oral  SpO2: 99% 99% 98% 97%  Weight:    87.8 kg  Height:        Intake/Output Summary (Last 24 hours) at 10/27/2023 0851 Last data filed at 10/27/2023 0333 Gross per 24 hour  Intake 349.6 ml  Output 1550 ml  Net -1200.4 ml   Net neg 6 L   Filed Weights   10/24/23 0500 10/25/23 0500 10/27/23 0325  Weight: 91.8 kg 90.9 kg 87.8 kg    Physical Exam   GEN: No acute distress.   Neck: No JVD. Cardiac: RRR  No murmurs   Respiratory: Nonlabored. Clear to auscultation bilaterally. MS  2+ UE edema   1-2+ LE edema    ECG/Telemetry    SR   Labs    Chemistry Recent Labs  Lab 10/23/23 0324 10/24/23 0312 10/25/23 0446 10/26/23 0435 10/27/23 0413  NA 135 131* 132* 135 134*  K 3.2* 4.5 4.3 4.7 4.2  CL 99 98 99 101 97*  CO2 21* 26 28 28 27   GLUCOSE 98 129* 80 79 99  BUN 61* 54* 51* 47* 45*  CREATININE 1.12* 1.04* 1.12* 1.28* 1.29*  CALCIUM 8.1* 8.3* 8.3* 8.4* 8.7*  PROT 4.4* 4.9* 4.6*  --   --   ALBUMIN  2.6* 2.6* 2.4*  --   --   AST 34 34 31  --   --   ALT 46* 52* 46*  --   --   ALKPHOS 51 78 72  --   --   BILITOT 0.8 0.5 0.8  --   --   GFRNONAA 50* 55* 50* 43* 42*  ANIONGAP 15 7 5 6 10     Hematology Recent Labs  Lab 10/24/23 0312 10/25/23 0446 10/26/23 0435  WBC 6.3 6.3 5.3  RBC 3.64* 3.84* 3.90  HGB 11.9* 11.5* 11.6*  HCT 35.7* 34.7* 35.5*  MCV 98.1 90.4 91.0  MCH 32.7 29.9 29.7  MCHC 33.3 33.1 32.7  RDW 19.3* 18.5* 18.2*  PLT 115* 134* 151    Cardiac Studies   Echocardiogram 09/21/2023:  1. Left ventricular  ejection fraction, by estimation, is 65 to 70%. The  left ventricle has normal function. The left ventricle has no regional  wall motion abnormalities. Left ventricular diastolic parameters are  indeterminate.   2. Right ventricular systolic function is normal. The right ventricular  size is normal. Tricuspid regurgitation signal is inadequate for assessing  PA pressure.   3. The mitral valve is normal in structure. No evidence of mitral valve  regurgitation. No evidence of mitral stenosis.   4. The aortic valve is tricuspid. Aortic valve regurgitation is not  visualized. No aortic stenosis is present.   Assessment & Plan   1.  Paroxysmal atrial fibrillation with intermittent RVR, CHA2DS2-VASc score is 4.  With recent hemorrhagic shock she is ont on anticoagulation.  Pt is maintaining sinus rhythm on amiodarone  200 mg twice daily (oral regimen started on May 25) and Lopressor  100  mg twice daily.  2.  Volume  overload/Felt due to third spacing in setting of transfusion, shock, hypoalbuminemia   Echocardiogram in April revealed LVEF 65 to 70% and normal RV contraction.  Diuresing with general improvement in edema Reviewed with hospitalist  THis will be slow  She is currently on lasix  20     BUN is improving   Cr relatively stable at 1.29      I think pt needs a little more than20  WOuld Rx with 40 mg with KCL supplement    THis will need to be followed very closely as outpt with labs over weekend or on Monday    May need to back off again     For questions or updates, please contact [facility] Please consult [email or web address] for contact info under   Signed, Ola Berger, MD  10/27/2023, 8:51 AM

## 2023-10-27 NOTE — Plan of Care (Signed)
   Problem: Activity: Goal: Risk for activity intolerance will decrease Outcome: Progressing   Problem: Coping: Goal: Level of anxiety will decrease Outcome: Progressing   Problem: Safety: Goal: Ability to remain free from injury will improve Outcome: Progressing

## 2023-10-27 NOTE — Discharge Instructions (Signed)
 May remove abdominal staples in one week.

## 2023-10-27 NOTE — Care Management Important Message (Signed)
 Important Message  Patient Details  Name: Felicia Frank MRN: [id number] Date of Birth: 1945-07-21   Important Message Given:  Yes - Medicare IM     Shelton Soler L Ryan Palermo 10/27/2023, 11:44 AM

## 2023-10-27 NOTE — TOC Transition Note (Signed)
 Transition of Care [facility]) - Discharge Note   Patient Details  Name: Felicia Frank MRN: [id number] Date of Birth: 1945/07/23  Transition of Care [facility]) CM/SW Contact:  Grandville Lax, LCSWA Phone Number: 10/27/2023, 10:43 AM   Clinical Narrative:    CSW updated that pt is medically stable for D/C to [facility]. CSW spoke with pts granddaughter to provide updates. D/C clinicals faxed to SNF. CSW spoke to [facility] in admissions who states they are ready to accept pt. CSW provided RN with room and report numbers. Med necessity printed to floor for RN. EMS called for transport. TOC signing off.   Final next level of care: Skilled Nursing Facility Barriers to Discharge: Barriers Resolved   Patient Goals and CMS Choice Patient states their goals for this hospitalization and ongoing recovery are:: go to [facility].gov Compare Post Acute Care list provided to:: Patient Choice offered to / list presented to : Patient      Discharge Placement              Patient [facility] bed at: [facility] Patient to be transferred to facility by: EMS Name of family member notified: Granddaughter Patient and family notified of of transfer: 10/27/23  Discharge Plan and Services Additional resources added to the After Visit Summary for   In-house Referral: Clinical Social Work Discharge Planning Services: CM Consult Post Acute Care Choice: Home Health                               Social Drivers of Health (SDOH) Interventions SDOH Screenings   Food Insecurity: No Food Insecurity (10/08/2023)  Housing: Low Risk  (10/08/2023)  Transportation Needs: No Transportation Needs (10/08/2023)  Utilities: Not At Risk (10/08/2023)  Social Connections: Socially Integrated (10/08/2023)  Tobacco Use: Low Risk  (10/21/2023)     Readmission Risk Interventions    10/10/2023   11:26 AM 10/07/2023   10:06 PM 09/16/2023   10:04 PM  Readmission Risk Prevention Plan   Post Dischage Appt   Complete  Medication Screening   Complete  Transportation Screening Complete Complete Complete  PCP or Specialist Appt within 5-7 Days  Complete   Home Care Screening  Complete   Medication Review (RN CM)  Complete   HRI or Home Care Consult Complete    Social Work Consult for Recovery Care Planning/Counseling Complete    Palliative Care Screening Not Applicable    Medication Review Oceanographer) Complete

## 2023-10-27 NOTE — Discharge Summary (Addendum)
 Physician Discharge Summary   Patient: Felicia Frank MRN: [id number] DOB: [birth year]  Admit date:     10/07/2023  Discharge date: 10/27/23  Discharge Physician: Myrtie Atkinson Natavia Sublette   PCP: Allana Ishikawa, MD   Recommendations at discharge:   Please follow up with primary care provider within 1-2 weeks  Please repeat CMP and CBC in one week Remove PICC line after last dose of Micafungin on 11/03/2023      Hospital Course: 78 y.o. female with medical history significant of hypothyroidism, hypertension, chronic kidney disease stage IIIa, class I obesity, atrial fibrillation and recent hospitalization secondary to diverticulitis with perforation and contained abscess status post drain placement and enteral fistula formation; who presented to the hospital secondary to ongoing abdominal pain, associated nausea/intermittent vomiting and difficulty keeping things down.  Patient has been admitted with diverticulitis of the colon with perforation and enterocolonic fistula.  She is status post Hartman's procedure with small bowel resection and ileocecectomy on 5/15.  Patient remains on TPN and Lasix  and continues to have some elevated heart rates requiring recurrent amiodarone  bolus and ongoing drip as well as IV metoprolol .  Surgery team following and Cardiology consulted for assistance in management as patient remains on IV amiodarone  infusion.   Once her HR was under controlled.  She was transitioned to po amiodarone .  Her HR remained controlled.  She continued to improve clinically and her diet was advanced and TPN was stopped.  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 - Completed IV antibiotics with Zosyn  for 5 days postoperatively thru 5/21 - Appreciate general surgery recommendations to advance to clear liquid diet and remove NG tube and continue TPN until tolerating solid food. - Started on Lasix  5/16 and -5.5 L fluid balance noted in the last 24 hours with stable creatinine levels with - restarted scheduled IV lasix  on 5/25 - Discontinued Foley catheter  - tolerating diet now   Rare Candida species seen in intraabdominal deep wound culture - continue micafungin - discussed with Dr. Larrie Po, planning for total 2 week course of antifungal therapy for treatment  - cannot use azoles due to interaction with amiodarone  - d/c to SNF with 7 more days micafungin--last day on 11/03/2023   Paroxysmal atrial fibrillation with intermittent RVR,  - continue amiodarone  (previously on IV amio earlier in admission) -Not anticoagulated in the setting of recent hemorrhagic shock requiring PRBC transfusions.  -d/c apixaban  -transitioned to po amiodarone  200 bid x 3 more days after d/c then start 200 mg po daily until she follows up with cardiology   Hypertriglyceridemia - triglycerides 751 from TNA and oral intake - called and discussed with pharm D to remove lipids from TNA - TNA was discontinued on 10/24/23   Leukocytosis - resolved  - WBC now down to normal   - added antifungal coverage on 5/23 with improvement in WBC   Severe hypoalbuminemia with 3rd spacing  - IV albumin  25 gram every 6 hours x 3 doses ordered 5/23  -  due to severe edema in upper arms, venous doppler US  negative for DVT  - restarted scheduled IV lasix  20 mg every 8 hours on 5/25  -transition to po lasix  am 5/29 - 09/21/23 echo--EF 65-70%, no WMA, normal RVF   Acute on chronic renal  failure--CKD 3 a -baseline creatinine 1.1-1.4 -due to volume depletion and hemodynamic changes -serum creatinine 1.29 on day of d/c   essential hypertension - now off pressor support and shock has resolved - currently on metoprolol  tartrate 100 mg BID   hypothyroidism - Continue Synthroid . - Taking p.o. daily with no difficulty   GERD - Continue PPI   compression fracture of L1 - Continue as needed analgesia - Will recommend continued use of TLSO brace at discharge.   class I obesity -Body mass index is 32.28 kg/m. - Low-calorie diet and portion control discussed with patient.         Consultants: cardiology, general surgery Procedures performed: Lamona Pilon procedure 10/13/23  Disposition: Skilled nursing facility Diet recommendation:  Soft diet DISCHARGE MEDICATION: Allergies as of 10/27/2023       Reactions   Amlodipine Swelling   Clonidine Rash   Rash with patch only.  Okay to take pill        Medication List     STOP taking these medications    apixaban  5 MG Tabs tablet Commonly known as: ELIQUIS    doxazosin  8 MG tablet Commonly known as: CARDURA    traMADol  50 MG tablet Commonly known as: Ultram    zolpidem  10 MG tablet Commonly known as: AMBIEN        TAKE these medications    acetaminophen  500 MG tablet Commonly known as: TYLENOL  Take 2 tablets (1,000 mg total) by mouth every 6 (six) hours.   amiodarone  200 MG tablet Commonly known as: PACERONE  Take 1 tablet (200 mg total) by mouth 2 (two) times daily. X3 days, then 200 mg once daily What changed:  when to take this additional instructions   estradiol 0.1 MG/GM vaginal cream Commonly known as: ESTRACE Place 0.5 g vaginally as needed (vaginal dryness/irritation). Twice a week PRN   furosemide  40 MG tablet Commonly known as: LASIX  Take 1 tablet (40 mg total) by mouth daily. Start taking on: Oct 28, 2023 What changed:  medication strength See the new instructions.   levothyroxine   50 MCG tablet Commonly known as: SYNTHROID  Take 50 mcg by mouth daily.   metoprolol  tartrate 100 MG tablet Commonly known as: LOPRESSOR  Take 1 tablet (100 mg total) by mouth 2 (two) times daily. What changed:  medication strength how much to take   micafungin in sodium chloride  0.9 % 100 mL 100 mg IV daily x 7 days.  Last dose on 11/03/23   ondansetron  4 MG tablet Commonly known as: Zofran  Take 1 tablet (4 mg total) by mouth daily as needed for nausea or vomiting.   pantoprazole  40 MG tablet Commonly known as: PROTONIX  Take 1 tablet (40 mg total) by mouth daily.   senna-docusate 8.6-50 MG tablet Commonly known as: Senokot-S Take 2 tablets by mouth 2 (two) times daily.        Follow-up Information     Laurann Pollock, MD Follow up on 11/09/2023.   Specialty: Cardiology Why: Cardiology Follow-up on 11/09/2023 at 11:40 AM. Contact information: [address] [place] [address] [phone]         [name], MD Follow up.   Specialty: General Surgery Why: As needed Contact information: [address] [place] [address]  [phone]                Discharge Exam: Filed Weights   10/24/23 0500 10/25/23 0500 10/27/23 0325  Weight: 91.8 kg 90.9 kg 87.8 kg   HEENT:  [place]/AT, No thrush, no icterus CV:  RRR, no rub, no S3, no S4 Lung:  fine bibasilar rales.  No wheeze Abd:  soft/+BS, NT Ext:  1+LE edema, no lymphangitis, no synovitis, no rash   Condition at discharge: stable  The results of significant diagnostics from this hospitalization (including imaging, microbiology, ancillary and laboratory) are listed below for reference.   Imaging Studies: US  Venous Img Upper Bilat (DVT) Result Date: 10/21/2023 CLINICAL DATA:  Bilateral arm pain EXAM: BILATERAL UPPER EXTREMITY VENOUS DOPPLER ULTRASOUND TECHNIQUE: Gray-scale sonography with graded compression, as well as color Doppler and duplex ultrasound were performed to evaluate the  bilateral upper extremity deep venous systems from the level of the subclavian vein and including the jugular, axillary, basilic, radial, ulnar and upper cephalic vein. Spectral Doppler was utilized to evaluate flow at rest and with distal augmentation maneuvers. COMPARISON:  None Available. FINDINGS: RIGHT UPPER EXTREMITY Internal Jugular Vein: No evidence of thrombus. Normal compressibility, respiratory phasicity and response to augmentation. Subclavian Vein: No evidence of thrombus. Normal compressibility, respiratory phasicity and response to augmentation. Axillary Vein: No evidence of thrombus. Normal compressibility, respiratory phasicity and response to augmentation. Cephalic Vein: No evidence of thrombus. Normal compressibility, respiratory phasicity and response to augmentation. Basilic Vein: Poorly seen with overlapping bandages and soft tissue. Brachial Veins: No evidence of thrombus. Normal compressibility, respiratory phasicity and response to augmentation. Radial Veins: Incompletely evaluated due to overlapping bandages. Ulnar Veins: Poorly seen with overlapping bandages. Venous Reflux:  None. Other Findings:  None. LEFT UPPER EXTREMITY Internal Jugular Vein: No evidence of thrombus. Normal compressibility, respiratory phasicity and response to augmentation. Subclavian Vein: No evidence of thrombus. Normal compressibility, respiratory phasicity and response to augmentation. Axillary Vein: No evidence of thrombus. Normal compressibility, respiratory phasicity and response to augmentation. Cephalic Vein: No evidence of thrombus. Normal compressibility, respiratory phasicity and response to augmentation. Basilic Vein: No evidence of thrombus. Normal compressibility, respiratory phasicity and response to augmentation. Brachial Veins: No evidence of thrombus. Normal compressibility, respiratory phasicity and response to augmentation. Radial Veins: No evidence of thrombus. Normal compressibility, respiratory  phasicity and response to augmentation. Ulnar Veins: No evidence of thrombus. Normal compressibility, respiratory phasicity and response to augmentation. Venous Reflux:  None. Other Findings:  None. IMPRESSION: No evidence of DVT within either upper extremity. Some limited evaluation of the right basilic vein and right radial and ulnar veins. Electronically Signed   By: Adrianna Horde M.D.   On: 10/21/2023 12:58   CT ANGIO GI BLEED Result Date: 10/19/2023 CLINICAL DATA:  Lower gastrointestinal hemorrhage EXAM: CTA ABDOMEN AND PELVIS WITHOUT AND WITH CONTRAST TECHNIQUE: Multidetector CT imaging of the abdomen and pelvis was performed using the standard protocol during bolus administration of intravenous contrast. Multiplanar reconstructed images and MIPs were obtained and reviewed to evaluate the vascular anatomy. RADIATION DOSE REDUCTION: This exam was performed according to the departmental dose-optimization program which includes automated exposure control, adjustment of the mA and/or kV according to patient size and/or use of iterative reconstruction technique. CONTRAST:  OMNIPAQUE  IOHEXOL  350 MG/ML SOLN COMPARISON:  None Available. FINDINGS: VASCULAR Aorta: Normal caliber aorta without aneurysm, dissection, vasculitis or significant stenosis. Moderate atherosclerotic calcification. Celiac: Patent without evidence of aneurysm, dissection, vasculitis or significant stenosis. SMA: Patent without evidence of aneurysm, dissection,  vasculitis or significant stenosis. Renals: Main renal arteries bilaterally are relatively diminutive. Right main renal artery is widely patent. 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 Diminutive accessory renal arteries are seen bilaterally. Normal vascular morphology. No aneurysm or dissection. IMA: Patent without evidence of aneurysm, dissection, vasculitis or significant stenosis. Inflow: Patent without evidence of  aneurysm, dissection, vasculitis or significant stenosis. Proximal Outflow: Bilateral common femoral and visualized portions of the superficial and profunda femoral arteries are patent without evidence of aneurysm, dissection, vasculitis or significant stenosis. Veins: Unremarkable Review of the MIP images confirms the above findings. NON-VASCULAR Lower chest: Small bilateral pleural effusions. Cardiac size within normal limits. Small hiatal hernia. Hepatobiliary: No focal liver abnormality is seen. Status post cholecystectomy. No biliary dilatation. Pancreas: Unremarkable Spleen: Unremarkable Adrenals/Urinary Tract: Adrenal glands are unremarkable. Simple exophytic cortical cyst is seen within the lower pole the right kidney for which no follow-up imaging is recommended. The kidneys are otherwise unremarkable. Bladder unremarkable. Stomach/Bowel: Partial small bowel and large bowel resection are seen with small bowel anastomosis within the left lower quadrant and enterocolic anastomosis within the right lower quadrant. 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 There is high density intraluminal enteric contents within the small bowel in the region of the small bowel anastomosis compatible with blood product resulting in a partial small bowel obstruction, however, there is no active gastrointestinal hemorrhage identified. Surgical drainage catheter seen within the deep pelvis. No free intraperitoneal gas or fluid. No loculated intra-abdominal fluid collections. Infiltration within the right lower quadrant small bowel mesentery as well as extraluminal retroperitoneal gas within left lower quadrant is likely postsurgical in nature. Punctate foci of gas within the laparotomy incision within the anterior abdominal wall are nonspecific. Lymphatic: No pathologic adenopathy within the abdomen and pelvis. Reproductive: Status post hysterectomy. No adnexal masses. Other: No  abdominal wall hernia Musculoskeletal: Stable subacute appearing superior endplate fracture of L1. No acute bone abnormality. No lytic or blastic bone lesion. Osseous structures are age appropriate. IMPRESSION: 1. No active gastrointestinal hemorrhage identified. 2. Intraluminal blood product within the small bowel in the region of the small bowel anastomosis resulting in a partial small bowel obstruction. 3. Surgical changes of partial small and large bowel resection with descending colostomy and Hartmann pouch formation. 4. Stable subacute appearing superior endplate fracture of L1. Aortic Atherosclerosis (ICD10-I70.0). Electronically Signed   By: Worthy Heads M.D.   On: 10/19/2023 04:24   DG CHEST PORT 1 VIEW Result Date: 10/09/2023 CLINICAL DATA:  [id number] PICC (peripherally inserted central catheter) in place [id number] EXAM: PORTABLE CHEST 1 VIEW COMPARISON:  Chest x-ray 09/20/2023 FINDINGS: Right PICC with tip overlying the right atrium, likely in appropriate position along the superior cavoatrial junction given low lung volumes. The heart and mediastinal contours are unchanged. Low lung volumes. No focal consolidation. No pulmonary edema. No pleural effusion. No pneumothorax. No acute osseous abnormality. IMPRESSION: 1. Right PICC with tip overlying the right atrium, likely in appropriate position along the superior cavoatrial junction given low lung volumes. 2. Low lung volumes. Electronically Signed   By: Morgane  Naveau M.D.   On: 10/09/2023 18:08   DG Abd 1 View Result Date: 10/09/2023 CLINICAL DATA:  Abdominal distension EXAM: ABDOMEN - 1 VIEW COMPARISON:  CT abdomen pelvis 10/07/2023 FINDINGS: Diffuse gaseous distension of the colon and small bowel. No dilated small bowel loops IMPRESSION: Diffuse gaseous distension of the colon and small bowel, likely ileus. Electronically Signed  By: Juanetta Nordmann M.D.   On: 10/09/2023 11:33   US  EKG SITE RITE Result Date: 10/09/2023 If Site Rite image not  attached, placement could not be confirmed due to current cardiac rhythm.  CT ABDOMEN PELVIS WO CONTRAST Result Date: 10/07/2023 CLINICAL DATA:  Diverticulitis EXAM: CT ABDOMEN AND PELVIS WITHOUT CONTRAST TECHNIQUE: Multidetector CT imaging of the abdomen and pelvis was performed following the standard protocol without IV contrast. RADIATION DOSE REDUCTION: This exam was performed according to the departmental dose-optimization program which includes automated exposure control, adjustment of the mA and/or kV according to patient size and/or use of iterative reconstruction technique. COMPARISON:  CT of the abdomen and pelvis performed September 26, 2023 FINDINGS: Lower chest: Trace pleural effusions. Hepatobiliary: No focal liver lesion. The gallbladder surgically absent. Pancreas: Unremarkable. No pancreatic ductal dilatation or surrounding inflammatory changes. Spleen: Normal in size without focal abnormality. Adrenals/Urinary Tract: Persistent nephrograms. Right lower pole renal cyst. No hydronephrosis. Residual contrast agent is present within the urinary bladder. Stomach/Bowel: Small hiatal hernia. Stomach is nondilated. The proximal small bowel is decompressed. Within the mid small bowel, there is mild diffuse gaseous distension. There is a transition from gaseous distension to decompressed in the central pelvis which is annotated on image 70 of series 2. Beyond this level, the small-bowel D comes decompressed and small in caliber. The terminal ileum is nondilated. Liquid and contrast containing stool material is than present within the right and transverse colon. This is moderate to large in volume. Contrast containing stool material is present in the distal colon. The rectum is largely decompressed. There is a small central air and fluid containing collection which is best seen on image 66 of series 2 which is estimated at 3.9 x 3.6 cm. This is adjacent to the prior catheter site (and previously contained air).  Vascular/Lymphatic: Not significantly changed. Reproductive: Unchanged Other: Diffuse anasarca. Musculoskeletal: Degenerative changes in the imaged osseous structures, stable. IMPRESSION: 1. Mild diffuse gaseous distension of central loops of small bowel with a transition point in the pelvis, just proximal to the terminal ileum suggestive of partial obstruction. 2. There is a small air and fluid containing collection in the central pelvis, adjacent to the previous drainage catheter, which measures 3.9 x 3.6 cm. This is not readily amenable to percutaneous access secondary to the central location and absence of access window. 3. A large volume of liquid stool material is present in the right and transverse colon. 4. Persistent nephrograms which can be observed in the setting of renal dysfunction. Electronically Signed   By: Reagan Camera M.D.   On: 10/07/2023 13:07    Microbiology: Results for orders placed or performed during the hospital encounter of 10/07/23  Aerobic/Anaerobic Culture w Gram Stain (surgical/deep wound)     Status: None   Collection Time: 10/13/23  3:58 PM   Specimen: Path fluid; GI  Result Value Ref Range Status   Specimen Description ABSCESS  Final   Special Requests INTRAABDOMINAL  Final   Gram Stain   Final    FEW WBC PRESENT, PREDOMINANTLY PMN NO ORGANISMS SEEN    Culture   Final    RARE CANDIDA ALBICANS NO ANAEROBES ISOLATED Performed at [facility], [address], [place], [place] [address]    Report Status 10/18/2023 FINAL  Final    Labs: CBC: Recent Labs  Lab 10/22/23 0311 10/22/23 0836 10/22/23 1957 10/23/23 0324 10/24/23 0312 10/25/23 0446 10/26/23 0435  WBC 14.6*  --   --  6.4  6.3 6.3 5.3  NEUTROABS 9.9*  --   --  4.9 4.9 4.3  --   HGB 7.8*   < > 11.4* 11.2* 11.9* 11.5* 11.6*  HCT 22.9*   < > 32.8* 31.5* 35.7* 34.7* 35.5*  MCV 88.4  --   --  86.3 98.1 90.4 91.0  PLT 105*  --   --  108* 115* 134* 151   < > = values in this interval not  displayed.   Basic Metabolic Panel: Recent Labs  Lab 10/21/23 0450 10/22/23 0311 10/23/23 0324 10/24/23 0312 10/25/23 0446 10/26/23 0435 10/27/23 0413  NA 129*   < > 135 131* 132* 135 134*  K 5.1   < > 3.2* 4.5 4.3 4.7 4.2  CL 105   < > 99 98 99 101 97*  CO2 19*   < > 21* 26 28 28 27   GLUCOSE 143*   < > 98 129* 80 79 99  BUN 60*   < > 61* 54* 51* 47* 45*  CREATININE 1.24*   < > 1.12* 1.04* 1.12* 1.28* 1.29*  CALCIUM 7.5*   < > 8.1* 8.3* 8.3* 8.4* 8.7*  MG 1.9  --   --  1.8 1.8 2.1 1.8  PHOS 3.6  --   --  3.6 3.9  --   --    < > = values in this interval not displayed.   Liver Function Tests: Recent Labs  Lab 10/22/23 0311 10/23/23 0324 10/24/23 0312 10/25/23 0446  AST 33 34 34 31  ALT 40 46* 52* 46*  ALKPHOS 38 51 78 72  BILITOT 0.8 0.8 0.5 0.8  PROT 4.6* 4.4* 4.9* 4.6*  ALBUMIN  3.1* 2.6* 2.6* 2.4*   CBG: Recent Labs  Lab 10/23/23 0547 10/23/23 1419 10/23/23 2116 10/24/23 0511 10/24/23 1152  GLUCAP 119* 140* 118* 133* 114*    Discharge time spent: greater than 30 minutes.  Signed: Demaris Fillers, MD [facility] 10/27/2023

## 2023-10-31 ENCOUNTER — Telehealth: Payer: Self-pay | Admitting: *Deleted

## 2023-10-31 NOTE — Telephone Encounter (Signed)
 Surgical Date: 10/13/2023 Procedure: Partial Colectomy/ Colostomy Creation  Received call from nurse with facility, [facility] [phone], Ext. [phone]/ [phone]~ fax.   Reports that patient has no scheduled appointments for post op follow ups. States that patient still has midline staples and sutures around colostomy. States that home may remove staples/ sutures with written order from provider.   Reports that midline wound appears to be healing well. States that colostomy noted red and would prefer provider to assess.   Appointment scheduled for 11/01/2023. Patient family to be asked to bring in patient.

## 2023-11-01 ENCOUNTER — Ambulatory Visit (INDEPENDENT_AMBULATORY_CARE_PROVIDER_SITE_OTHER): Admitting: General Surgery

## 2023-11-01 ENCOUNTER — Encounter: Payer: Self-pay | Admitting: General Surgery

## 2023-11-01 VITALS — BP 130/71 | HR 60 | Temp 97.7°F | Resp 12 | Ht 64.0 in | Wt 193.0 lb

## 2023-11-01 DIAGNOSIS — Z09 Encounter for follow-up examination after completed treatment for conditions other than malignant neoplasm: Secondary | ICD-10-CM

## 2023-11-01 NOTE — Progress Notes (Signed)
 Subjective:     Felicia Frank  Postoperative visit.  Patient is at a nursing home in [place] .  She states she is starting to ambulate a little bit better but still continues to have lower leg and foot swelling.  This is being addressed.  She is getting used to using her colostomy.  She has been on a pured diet since her discharge from [facility].  Overall, her outlook on life is much improved.  She denies any abdominal pain or drainage from her incision. Objective:    BP 130/71   Pulse 60   Temp 97.7 F (36.5 C) (Oral)   Resp 12   Ht 5\' 4"  (1.626 m)   Wt 193 lb (87.5 kg)   SpO2 95%   BMI 33.13 kg/m   General:  alert, cooperative, and no distress  Abdomen is soft, incision healing well.  Staples removed, Steri-Strips applied.  The colostomy was inspected but I was unable to remove the wafer.  It is pink and patent with minimal granulation tissue noted where the dissolvable stitches are.     Assessment:    Doing well postoperatively.  Patient continues to recover well.  She is receiving physical therapy due to deconditioning.    Plan:  Patient may be on regular diet.  They may need to apply paste to the surrounding skin at the colostomy site.  I told them that I be more than happy to order supplies for her once she is discharged from the rehabilitation unit.  Follow-up here as needed.

## 2023-11-09 ENCOUNTER — Ambulatory Visit: Admitting: Cardiology

## 2024-02-08 ENCOUNTER — Other Ambulatory Visit: Payer: Self-pay | Admitting: Neurological Surgery

## 2024-02-16 NOTE — Telephone Encounter (Signed)
 Spoke with patient and she has been in and out of hospital since May. She has major back surgery scheduled for next week so does not think she can get her any sooner than that anyway. She should have enough medication to last but I told her to call if she does not and to call if she feels like she needs to be seen sooner. She said that her Kidney labs have been stable and thinks she will be fine.

## 2024-02-16 NOTE — Progress Notes (Signed)
 Surgical Instructions   Your procedure is scheduled on Wednesday February 22, 2024. Report to [facility] Main Entrance A at 10:30 A.M., then check in with the Admitting office. Any questions or running late day of surgery: call [phone]  Questions prior to your surgery date: call [phone], Monday-Friday, 8am-4pm. If you experience any cold or flu symptoms such as cough, fever, chills, shortness of breath, etc. between now and your scheduled surgery, please notify us  at the above number.     Remember:  Do not eat or drink after midnight the night before your surgery   Take these medicines the morning of surgery with A SIP OF WATER carvedilol  (COREG )  cloNIDine (CATAPRES)  famotidine  (PEPCID )  hydrALAZINE (APRESOLINE)  levothyroxine  (SYNTHROID )    May take these medicines IF NEEDED: acetaminophen  (TYLENOL )  traMADol  (ULTRAM )   One week prior to surgery, STOP taking any Aspirin (unless otherwise instructed by your surgeon) Aleve, Naproxen, Ibuprofen, Motrin, Advil, Goody's, BC's, all herbal medications, fish oil, and non-prescription vitamins.                     Do NOT Smoke (Tobacco/Vaping) for 24 hours prior to your procedure.  If you use a CPAP at night, you may bring your mask/headgear for your overnight stay.   You will be asked to remove any contacts, glasses, piercing's, hearing aid's, dentures/partials prior to surgery. Please bring cases for these items if needed.    Patients discharged the day of surgery will not be allowed to drive home, and someone needs to stay with them for 24 hours.  SURGICAL WAITING ROOM VISITATION Patients may have no more than 2 support people in the waiting area - these visitors may rotate.   Pre-op nurse will coordinate an appropriate time for 1 ADULT support person, who may not rotate, to accompany patient in pre-op.  Children under the age of 7 must have an adult with them who is not the patient and must remain in the main  waiting area with an adult.  If the patient needs to stay at the hospital during part of their recovery, the visitor guidelines for inpatient rooms apply.  Please refer to the [facility] website for the visitor guidelines for any additional information.   If you received a COVID test during your pre-op visit  it is requested that you wear a mask when out in public, stay away from anyone that may not be feeling well and notify your surgeon if you develop symptoms. If you have been in contact with anyone that has tested positive in the last 10 days please notify you surgeon.      Pre-operative 5 CHG Bathing Instructions   You can play a key role in reducing the risk of infection after surgery. Your skin needs to be as free of germs as possible. You can reduce the number of germs on your skin by washing with CHG (chlorhexidine  gluconate) soap before surgery. CHG is an antiseptic soap that kills germs and continues to kill germs even after washing.   DO NOT use if you have an allergy to chlorhexidine /CHG or antibacterial soaps. If your skin becomes reddened or irritated, stop using the CHG and notify one of our RNs at [phone].   Please shower with the CHG soap starting 4 days before surgery using the following schedule:     Please keep in mind the following:  DO NOT shave, including legs and underarms, starting the day of your first shower.  You may shave your face at any point before/day of surgery.  Place clean sheets on your bed the day you start using CHG soap. Use a clean washcloth (not used since being washed) for each shower. DO NOT sleep with pets once you start using the CHG.   CHG Shower Instructions:  Wash your face and private area with normal soap. If you choose to wash your hair, wash first with your normal shampoo.  After you use shampoo/soap, rinse your hair and body thoroughly to remove shampoo/soap residue.  Turn the water OFF and apply about 3 tablespoons (45 ml)  of CHG soap to a CLEAN washcloth.  Apply CHG soap ONLY FROM YOUR NECK DOWN TO YOUR TOES (washing for 3-5 minutes)  DO NOT use CHG soap on face, private areas, open wounds, or sores.  Pay special attention to the area where your surgery is being performed.  If you are having back surgery, having someone wash your back for you may be helpful. Wait 2 minutes after CHG soap is applied, then you may rinse off the CHG soap.  Pat dry with a clean towel  Put on clean clothes/pajamas   If you choose to wear lotion, please use ONLY the CHG-compatible lotions that are listed below.  Additional instructions for the day of surgery: DO NOT APPLY any lotions, deodorants, cologne, or perfumes.   Do not bring valuables to the hospital. [facility] is not responsible for any belongings/valuables. Do not wear nail polish, gel polish, artificial nails, or any other type of covering on natural nails (fingers and toes) Do not wear jewelry or makeup Put on clean/comfortable clothes.  Please brush your teeth.  Ask your nurse before applying any prescription medications to the skin.     CHG Compatible Lotions   Aveeno Moisturizing lotion  Cetaphil Moisturizing Cream  Cetaphil Moisturizing Lotion  Clairol Herbal Essence Moisturizing Lotion, Dry Skin  Clairol Herbal Essence Moisturizing Lotion, Extra Dry Skin  Clairol Herbal Essence Moisturizing Lotion, Normal Skin  Curel Age Defying Therapeutic Moisturizing Lotion with Alpha Hydroxy  Curel Extreme Care Body Lotion  Curel Soothing Hands Moisturizing Hand Lotion  Curel Therapeutic Moisturizing Cream, Fragrance-Free  Curel Therapeutic Moisturizing Lotion, Fragrance-Free  Curel Therapeutic Moisturizing Lotion, Original Formula  Eucerin Daily Replenishing Lotion  Eucerin Dry Skin Therapy Plus Alpha Hydroxy Crme  Eucerin Dry Skin Therapy Plus Alpha Hydroxy Lotion  Eucerin Original Crme  Eucerin Original Lotion  Eucerin Plus Crme Eucerin Plus Lotion   Eucerin TriLipid Replenishing Lotion  Keri Anti-Bacterial Hand Lotion  Keri Deep Conditioning Original Lotion Dry Skin Formula Softly Scented  Keri Deep Conditioning Original Lotion, Fragrance Free Sensitive Skin Formula  Keri Lotion Fast Absorbing Fragrance Free Sensitive Skin Formula  Keri Lotion Fast Absorbing Softly Scented Dry Skin Formula  Keri Original Lotion  Keri Skin Renewal Lotion Keri Silky Smooth Lotion  Keri Silky Smooth Sensitive Skin Lotion  Nivea Body Creamy Conditioning Oil  Nivea Body Extra Enriched Lotion  Nivea Body Original Lotion  Nivea Body Sheer Moisturizing Lotion Nivea Crme  Nivea Skin Firming Lotion  NutraDerm 30 Skin Lotion  NutraDerm Skin Lotion  NutraDerm Therapeutic Skin Cream  NutraDerm Therapeutic Skin Lotion  ProShield Protective Hand Cream  Provon moisturizing lotion  Please read over the following fact sheets that you were given.

## 2024-02-16 NOTE — Telephone Encounter (Signed)
 Mrs.Hark called this afternoon to schedule her follow up appointment. She had a recall for September, but she reports she was in the hospital for a month and was not able to schedule her follow up. Dr. Augustin wanted to her come back in 6 months which would be September, but unfortunately Dr. Augustin does not have any more appointments available in September, so I booked her for December 20th which was his first opening. Dr.Casey also marked her as MD only in the last visit/follow up instructions.   Patient accepted the appointment, but says her medication will run our prior to this visit date. Please reach out to patient to get her an earlier appointment if appropriate or to refill her medications for her to make it to this appointment.   Sending this telephone encounter to Dr. Willeen nurse and the nurse pool.

## 2024-02-17 ENCOUNTER — Other Ambulatory Visit: Payer: Self-pay

## 2024-02-17 ENCOUNTER — Encounter (HOSPITAL_COMMUNITY)
Admission: RE | Admit: 2024-02-17 | Discharge: 2024-02-17 | Disposition: A | Discharge status: Home / Self Care | Source: Ambulatory Visit | Attending: Neurosurgery | Admitting: Neurosurgery

## 2024-02-17 ENCOUNTER — Encounter (HOSPITAL_COMMUNITY): Payer: Self-pay

## 2024-02-17 ENCOUNTER — Encounter (HOSPITAL_COMMUNITY): Payer: Self-pay | Admitting: Vascular Surgery

## 2024-02-17 VITALS — BP 131/49 | HR 66 | Temp 98.0°F | Resp 17 | Ht 63.5 in | Wt 165.5 lb

## 2024-02-17 DIAGNOSIS — D631 Anemia in chronic kidney disease: Secondary | ICD-10-CM | POA: Diagnosis not present

## 2024-02-17 DIAGNOSIS — I10 Essential (primary) hypertension: Secondary | ICD-10-CM

## 2024-02-17 DIAGNOSIS — Z9889 Other specified postprocedural states: Secondary | ICD-10-CM | POA: Diagnosis not present

## 2024-02-17 DIAGNOSIS — N189 Chronic kidney disease, unspecified: Secondary | ICD-10-CM | POA: Diagnosis not present

## 2024-02-17 DIAGNOSIS — Z933 Colostomy status: Secondary | ICD-10-CM | POA: Insufficient documentation

## 2024-02-17 DIAGNOSIS — E039 Hypothyroidism, unspecified: Secondary | ICD-10-CM | POA: Diagnosis not present

## 2024-02-17 DIAGNOSIS — I129 Hypertensive chronic kidney disease with stage 1 through stage 4 chronic kidney disease, or unspecified chronic kidney disease: Secondary | ICD-10-CM | POA: Diagnosis not present

## 2024-02-17 DIAGNOSIS — M8008XA Age-related osteoporosis with current pathological fracture, vertebra(e), initial encounter for fracture: Secondary | ICD-10-CM | POA: Insufficient documentation

## 2024-02-17 DIAGNOSIS — I4891 Unspecified atrial fibrillation: Secondary | ICD-10-CM | POA: Diagnosis not present

## 2024-02-17 DIAGNOSIS — Z01810 Encounter for preprocedural cardiovascular examination: Secondary | ICD-10-CM | POA: Diagnosis present

## 2024-02-17 DIAGNOSIS — Z01812 Encounter for preprocedural laboratory examination: Secondary | ICD-10-CM | POA: Diagnosis present

## 2024-02-17 DIAGNOSIS — Z01818 Encounter for other preprocedural examination: Secondary | ICD-10-CM | POA: Diagnosis not present

## 2024-02-17 HISTORY — DX: Anemia, unspecified: D64.9

## 2024-02-17 HISTORY — DX: Age-related osteoporosis without current pathological fracture: M81.0

## 2024-02-17 HISTORY — DX: Unspecified atrial fibrillation: I48.91

## 2024-02-17 HISTORY — DX: History of falling: Z91.81

## 2024-02-17 HISTORY — DX: Other chronic pain: G89.29

## 2024-02-17 HISTORY — DX: Chronic kidney disease, unspecified: N18.9

## 2024-02-17 LAB — BASIC METABOLIC PANEL WITH GFR
Anion gap: 7 (ref 5–15)
BUN: 43 mg/dL — ABNORMAL HIGH (ref 8–23)
CO2: 21 mmol/L — ABNORMAL LOW (ref 22–32)
Calcium: 9.2 mg/dL (ref 8.9–10.3)
Chloride: 107 mmol/L (ref 98–111)
Creatinine, Ser: 1.73 mg/dL — ABNORMAL HIGH (ref 0.44–1.00)
GFR, Estimated: 30 mL/min — ABNORMAL LOW (ref >60)
Glucose, Bld: 90 mg/dL (ref 70–99)
Potassium: 5.2 mmol/L — ABNORMAL HIGH (ref 3.5–5.1)
Sodium: 135 mmol/L (ref 135–145)

## 2024-02-17 LAB — SURGICAL PCR SCREEN
MRSA, PCR: NEGATIVE
Staphylococcus aureus: NEGATIVE

## 2024-02-17 NOTE — Progress Notes (Signed)
 PCP - Dr. Devere Seaman Cardiologist - Dr. Rosalba-- pt called office and requested last office notes to be sent. Will await fax from office, release of information form signed as back up.  Chest x-ray -  EKG - 02/17/24-- repeat today per anesthesia Stress Test - denies ECHO - 09/21/23 Cardiac Cath - years ago ~15-20 years, was told it was fine  Sleep Study - n/a CPAP -   Fasting Blood Sugar - n/a Checks Blood Sugar _____ times a day  Last dose of GLP1 agonist- n/a   GLP1 instructions:   Blood Thinner Instructions: Aspirin Instructions: Reports has not taken ASA since hospitalization in May 2025  ERAS Protcol - NPO PRE-SURGERY Ensure or G2-   COVID TEST- n/a  Labs from 02/07/24 PCP visit provided by patient and placed in chart. K+ 5.6. Pt states she had previously been on potassium supplementation which has since been stopped. PCP following labs. Will get repeat today per anesthesia.  Instructed to bring extra ostomy supplies DOS  Anesthesia review: Yes. May 2025 hospitalization.   Patient denies shortness of breath, fever, cough and chest pain at PAT appointment

## 2024-02-20 ENCOUNTER — Inpatient Hospital Stay (HOSPITAL_COMMUNITY)
Admission: EM | Admit: 2024-02-20 | Discharge: 2024-03-02 | DRG: 281 | Disposition: A | Discharge status: Home / Self Care | Attending: Internal Medicine | Admitting: Internal Medicine

## 2024-02-20 ENCOUNTER — Emergency Department (HOSPITAL_COMMUNITY)

## 2024-02-20 ENCOUNTER — Encounter (HOSPITAL_COMMUNITY): Payer: Self-pay | Admitting: *Deleted

## 2024-02-20 ENCOUNTER — Other Ambulatory Visit: Payer: Self-pay

## 2024-02-20 DIAGNOSIS — S32010A Wedge compression fracture of first lumbar vertebra, initial encounter for closed fracture: Secondary | ICD-10-CM | POA: Diagnosis present

## 2024-02-20 DIAGNOSIS — N179 Acute kidney failure, unspecified: Secondary | ICD-10-CM | POA: Diagnosis present

## 2024-02-20 DIAGNOSIS — D539 Nutritional anemia, unspecified: Secondary | ICD-10-CM | POA: Diagnosis not present

## 2024-02-20 DIAGNOSIS — E876 Hypokalemia: Secondary | ICD-10-CM | POA: Diagnosis not present

## 2024-02-20 DIAGNOSIS — N1832 Chronic kidney disease, stage 3b: Secondary | ICD-10-CM | POA: Diagnosis present

## 2024-02-20 DIAGNOSIS — I483 Typical atrial flutter: Principal | ICD-10-CM | POA: Diagnosis present

## 2024-02-20 DIAGNOSIS — D519 Vitamin B12 deficiency anemia, unspecified: Secondary | ICD-10-CM | POA: Diagnosis present

## 2024-02-20 DIAGNOSIS — Z7982 Long term (current) use of aspirin: Secondary | ICD-10-CM

## 2024-02-20 DIAGNOSIS — Z79899 Other long term (current) drug therapy: Secondary | ICD-10-CM

## 2024-02-20 DIAGNOSIS — I480 Paroxysmal atrial fibrillation: Secondary | ICD-10-CM | POA: Diagnosis present

## 2024-02-20 DIAGNOSIS — I4892 Unspecified atrial flutter: Secondary | ICD-10-CM | POA: Diagnosis not present

## 2024-02-20 DIAGNOSIS — R578 Other shock: Secondary | ICD-10-CM

## 2024-02-20 DIAGNOSIS — I129 Hypertensive chronic kidney disease with stage 1 through stage 4 chronic kidney disease, or unspecified chronic kidney disease: Secondary | ICD-10-CM | POA: Diagnosis present

## 2024-02-20 DIAGNOSIS — K289 Gastrojejunal ulcer, unspecified as acute or chronic, without hemorrhage or perforation: Secondary | ICD-10-CM

## 2024-02-20 DIAGNOSIS — M810 Age-related osteoporosis without current pathological fracture: Secondary | ICD-10-CM | POA: Diagnosis present

## 2024-02-20 DIAGNOSIS — Z6829 Body mass index (BMI) 29.0-29.9, adult: Secondary | ICD-10-CM

## 2024-02-20 DIAGNOSIS — E039 Hypothyroidism, unspecified: Secondary | ICD-10-CM | POA: Diagnosis present

## 2024-02-20 DIAGNOSIS — E46 Unspecified protein-calorie malnutrition: Secondary | ICD-10-CM | POA: Insufficient documentation

## 2024-02-20 DIAGNOSIS — Z888 Allergy status to other drugs, medicaments and biological substances status: Secondary | ICD-10-CM

## 2024-02-20 DIAGNOSIS — Z8249 Family history of ischemic heart disease and other diseases of the circulatory system: Secondary | ICD-10-CM

## 2024-02-20 DIAGNOSIS — N289 Disorder of kidney and ureter, unspecified: Secondary | ICD-10-CM

## 2024-02-20 DIAGNOSIS — E8809 Other disorders of plasma-protein metabolism, not elsewhere classified: Secondary | ICD-10-CM | POA: Diagnosis present

## 2024-02-20 DIAGNOSIS — I959 Hypotension, unspecified: Secondary | ICD-10-CM | POA: Diagnosis not present

## 2024-02-20 DIAGNOSIS — N1831 Chronic kidney disease, stage 3a: Secondary | ICD-10-CM

## 2024-02-20 DIAGNOSIS — R002 Palpitations: Secondary | ICD-10-CM | POA: Diagnosis present

## 2024-02-20 DIAGNOSIS — K219 Gastro-esophageal reflux disease without esophagitis: Secondary | ICD-10-CM | POA: Diagnosis present

## 2024-02-20 DIAGNOSIS — Z9049 Acquired absence of other specified parts of digestive tract: Secondary | ICD-10-CM

## 2024-02-20 DIAGNOSIS — N189 Chronic kidney disease, unspecified: Secondary | ICD-10-CM | POA: Diagnosis present

## 2024-02-20 DIAGNOSIS — I10 Essential (primary) hypertension: Secondary | ICD-10-CM | POA: Diagnosis present

## 2024-02-20 DIAGNOSIS — D649 Anemia, unspecified: Secondary | ICD-10-CM

## 2024-02-20 DIAGNOSIS — M4856XA Collapsed vertebra, not elsewhere classified, lumbar region, initial encounter for fracture: Secondary | ICD-10-CM | POA: Diagnosis present

## 2024-02-20 DIAGNOSIS — K2970 Gastritis, unspecified, without bleeding: Secondary | ICD-10-CM | POA: Diagnosis present

## 2024-02-20 DIAGNOSIS — I21A1 Myocardial infarction type 2: Secondary | ICD-10-CM | POA: Diagnosis present

## 2024-02-20 DIAGNOSIS — Z933 Colostomy status: Secondary | ICD-10-CM

## 2024-02-20 DIAGNOSIS — Z90710 Acquired absence of both cervix and uterus: Secondary | ICD-10-CM

## 2024-02-20 DIAGNOSIS — R7989 Other specified abnormal findings of blood chemistry: Secondary | ICD-10-CM | POA: Insufficient documentation

## 2024-02-20 DIAGNOSIS — Z79890 Hormone replacement therapy (postmenopausal): Secondary | ICD-10-CM

## 2024-02-20 DIAGNOSIS — K449 Diaphragmatic hernia without obstruction or gangrene: Secondary | ICD-10-CM | POA: Diagnosis present

## 2024-02-20 LAB — COMPREHENSIVE METABOLIC PANEL WITH GFR
ALT: 39 U/L (ref 0–44)
AST: 22 U/L (ref 15–41)
Albumin: 3.3 g/dL — ABNORMAL LOW (ref 3.5–5.0)
Alkaline Phosphatase: 75 U/L (ref 38–126)
Anion gap: 13 (ref 5–15)
BUN: 36 mg/dL — ABNORMAL HIGH (ref 8–23)
CO2: 18 mmol/L — ABNORMAL LOW (ref 22–32)
Calcium: 8.6 mg/dL — ABNORMAL LOW (ref 8.9–10.3)
Chloride: 104 mmol/L (ref 98–111)
Creatinine, Ser: 1.51 mg/dL — ABNORMAL HIGH (ref 0.44–1.00)
GFR, Estimated: 35 mL/min — ABNORMAL LOW (ref >60)
Glucose, Bld: 116 mg/dL — ABNORMAL HIGH (ref 70–99)
Potassium: 3.4 mmol/L — ABNORMAL LOW (ref 3.5–5.1)
Sodium: 135 mmol/L (ref 135–145)
Total Bilirubin: 0.6 mg/dL (ref 0.0–1.2)
Total Protein: 6.2 g/dL — ABNORMAL LOW (ref 6.5–8.1)

## 2024-02-20 LAB — CBC WITH DIFFERENTIAL/PLATELET
Abs Immature Granulocytes: 0.08 10*3/uL — ABNORMAL HIGH (ref 0.00–0.07)
Basophils Absolute: 0 10*3/uL (ref 0.0–0.1)
Basophils Relative: 0 %
Eosinophils Absolute: 0 10*3/uL (ref 0.0–0.5)
Eosinophils Relative: 0 %
HCT: 34.5 % — ABNORMAL LOW (ref 36.0–46.0)
Hemoglobin: 11.4 g/dL — ABNORMAL LOW (ref 12.0–15.0)
Immature Granulocytes: 1 %
Lymphocytes Relative: 20 %
Lymphs Abs: 2 10*3/uL (ref 0.7–4.0)
MCH: 34 pg (ref 26.0–34.0)
MCHC: 33 g/dL (ref 30.0–36.0)
MCV: 103 fL — ABNORMAL HIGH (ref 80.0–100.0)
Monocytes Absolute: 0.9 10*3/uL (ref 0.1–1.0)
Monocytes Relative: 9 %
Neutro Abs: 6.9 10*3/uL (ref 1.7–7.7)
Neutrophils Relative %: 70 %
Platelets: 169 10*3/uL (ref 150–400)
RBC: 3.35 MIL/uL — ABNORMAL LOW (ref 3.87–5.11)
RDW: 13.9 % (ref 11.5–15.5)
WBC: 9.9 10*3/uL (ref 4.0–10.5)
nRBC: 0 % (ref 0.0–0.2)

## 2024-02-20 LAB — TROPONIN I (HIGH SENSITIVITY)
Troponin I (High Sensitivity): 43 ng/L — ABNORMAL HIGH
Troponin I (High Sensitivity): 52 ng/L — ABNORMAL HIGH (ref <18)

## 2024-02-20 LAB — BRAIN NATRIURETIC PEPTIDE: B Natriuretic Peptide: 187 pg/mL — ABNORMAL HIGH (ref 0.0–100.0)

## 2024-02-20 MED ORDER — ENSURE PLUS HIGH PROTEIN PO LIQD
237.0000 mL | Freq: Two times a day (BID) | ORAL | Status: DC
  Administered 2024-02-21: 237 mL via ORAL
  Filled 2024-02-20 (×6): qty 237

## 2024-02-20 MED ORDER — ONDANSETRON HCL 4 MG/2ML IJ SOLN
4.0000 mg | Freq: Four times a day (QID) | INTRAMUSCULAR | Status: DC | PRN
  Administered 2024-02-21: 4 mg via INTRAVENOUS
  Filled 2024-02-20: qty 2

## 2024-02-20 MED ORDER — METOPROLOL TARTRATE 5 MG/5ML IV SOLN: 5.0000 mg | Freq: Once | INTRAVENOUS | Status: DC

## 2024-02-20 MED ORDER — ACETAMINOPHEN 650 MG RE SUPP: 650.0000 mg | Freq: Four times a day (QID) | RECTAL | Status: DC | PRN

## 2024-02-20 MED ORDER — ACETAMINOPHEN 325 MG PO TABS
650.0000 mg | ORAL_TABLET | Freq: Four times a day (QID) | ORAL | Status: DC | PRN
  Administered 2024-02-21 – 2024-02-29 (×6): 650 mg via ORAL
  Filled 2024-02-20 (×6): qty 2

## 2024-02-20 MED ORDER — POTASSIUM CHLORIDE CRYS ER 20 MEQ PO TBCR
40.0000 meq | EXTENDED_RELEASE_TABLET | Freq: Once | ORAL | Status: DC
  Filled 2024-02-20: qty 2

## 2024-02-20 MED ORDER — DILTIAZEM HCL-DEXTROSE 125-5 MG/125ML-% IV SOLN (PREMIX)
5.0000 mg/h | INTRAVENOUS | Status: DC
Start: 2024-02-20 — End: 2024-02-21
  Administered 2024-02-20: 5 mg/h via INTRAVENOUS
  Filled 2024-02-20 (×2): qty 125

## 2024-02-20 MED ORDER — ONDANSETRON HCL 4 MG PO TABS
4.0000 mg | ORAL_TABLET | Freq: Four times a day (QID) | ORAL | Status: DC | PRN
  Filled 2024-02-20: qty 1

## 2024-02-20 MED ORDER — DILTIAZEM HCL 25 MG/5ML IV SOLN
10.0000 mg | Freq: Once | INTRAVENOUS | Status: AC
  Administered 2024-02-20: 10 mg via INTRAVENOUS
  Filled 2024-02-20: qty 5

## 2024-02-20 NOTE — ED Notes (Signed)
Pt ambulatory w/ walker to restroom  ?

## 2024-02-20 NOTE — ED Provider Notes (Addendum)
 [place] EM[facility]GENCY DEPARTMENT AT [facility] Provider Note   CSN: [id number] Arrival date & time: 02/20/24  8175     Patient presents with: Tachycardia   Felicia Frank is a 78 y.o. female.   HPI   This patient is a 78 year old female presenting with tachycardia, the patient reports that for the last several days the patient has had a feeling of arm heaviness bilaterally chest heaviness and no energy, she has checked her heart rate several times at home and it has been in the 160s, when she woke up this morning it was only 80 but very quickly went high again.  She has not been taking her beta-blocker because she felt like her blood pressure was low, she ended up going to her kidney doctor's office in [place]  today and was seen by the physician assistant who recommended that she come to the emergency department here in [place]  instead of going to [facility] to be cared for for this tachycardia.  The patient was placed on anticoagulants when this condition was diagnosed in the past however she had gastrointestinal bleeding and anemia so the anticoagulant was discontinued.  The patient denies shortness of breath, vomiting, swelling, headache.  Prior to Admission medications   Medication Sig Start Date End Date Taking? Authorizing Provider  acetaminophen  (TYLENOL ) 500 MG tablet Take 500-1,000 mg by mouth every 6 (six) hours as needed (pain.).   Yes [provider]  aspirin  EC 81 MG tablet Take 81 mg by mouth. 12/29/23  Yes [provider]  bumetanide  (BUMEX ) 1 MG tablet Take 0.5-1 mg by mouth daily as needed (fluid retention.). 01/09/24  Yes [provider]  carvedilol  (COREG ) 25 MG tablet Take 25 mg by mouth in the morning and at bedtime. 12/15/23  Yes [provider]  cloNIDine (CATAPRES) 0.1 MG tablet Take 0.1 mg by mouth 3 (three) times daily as needed (HIGH BLOOD PRESSURE (BP > 150)). 12/29/23  Yes  [provider]  doxazosin  (CARDURA ) 8 MG tablet Take 8 mg by mouth at bedtime. 12/29/23  Yes [provider]  famotidine  (PEPCID ) 40 MG tablet Take 40 mg by mouth in the morning. 12/29/23  Yes [provider]  hydrALAZINE (APRESOLINE) 25 MG tablet Take 25 mg by mouth in the morning and at bedtime. 02/09/24  Yes [provider]  levothyroxine  (SYNTHROID ) 100 MCG tablet Take 100 mcg by mouth daily before breakfast. 02/09/24  Yes [provider]  magnesium  oxide (MAG-OX) 400 MG tablet Take 400 mg by mouth daily. 11/22/23  Yes [provider]  spironolactone-hydrochlorothiazide (ALDACTAZIDE) 25-25 MG tablet Take 1 tablet by mouth as directed. Every other day as needed for Fluid   Yes [provider]  telmisartan (MICARDIS) 80 MG tablet Take 80 mg by mouth in the morning.   Yes [provider]  traMADol  (ULTRAM ) 50 MG tablet Take 50 mg by mouth every 12 (twelve) hours as needed (pain.).   Yes [provider]  zolpidem  (AMBIEN ) 10 MG tablet Take 10 mg by mouth at bedtime as needed.   Yes [provider]    Allergies: Amlodipine and Clonidine    Review of Systems  All other systems reviewed and are negative.   Updated Vital Signs BP (!) 126/59   Pulse (!) 148   Temp 98 F (36.7 C) (Oral)   Resp (!) 26   Ht 1.6 m (5' 3)   Wt 74.8 kg   SpO2 98%   BMI  29.23 kg/m   Physical Exam Vitals and nursing note reviewed.  Constitutional:      General: She is not in acute distress.    Appearance: She is well-developed.  HENT:     Head: Normocephalic and atraumatic.     Mouth/Throat:     Pharynx: No oropharyngeal exudate.  Eyes:     General: No scleral icterus.       Right eye: No discharge.        Left eye: No discharge.     Conjunctiva/sclera: Conjunctivae normal.     Pupils: Pupils are equal, round, and reactive to light.  Neck:     Thyroid: No thyromegaly.     Vascular: No JVD.  Cardiovascular:      Rate and Rhythm: Regular rhythm. Tachycardia present.     Heart sounds: Normal heart sounds. No murmur heard.    No friction rub. No gallop.  Pulmonary:     Effort: Pulmonary effort is normal. No respiratory distress.     Breath sounds: Normal breath sounds. No wheezing or rales.  Abdominal:     General: Bowel sounds are normal. There is no distension.     Palpations: Abdomen is soft. There is no mass.     Tenderness: There is no abdominal tenderness.  Musculoskeletal:        General: No tenderness. Normal range of motion.     Cervical back: Normal range of motion and neck supple.     Right lower leg: No edema.     Left lower leg: No edema.  Lymphadenopathy:     Cervical: No cervical adenopathy.  Skin:    General: Skin is warm and dry.     Findings: No erythema or rash.  Neurological:     Mental Status: She is alert.     Coordination: Coordination normal.  Psychiatric:        Behavior: Behavior normal.     (all labs ordered are listed, but only abnormal results are displayed) Labs Reviewed  COMPREHENSIVE METABOLIC PANEL WITH GFR - Abnormal; Notable for the following components:      Result Value   Potassium 3.4 (*)    CO2 18 (*)    Glucose, Bld 116 (*)    BUN 36 (*)    Creatinine, Ser 1.51 (*)    Calcium 8.6 (*)    Total Protein 6.2 (*)    Albumin  3.3 (*)    GFR, Estimated 35 (*)    All other components within normal limits  CBC WITH DIFFERENTIAL/PLATELET - Abnormal; Notable for the following components:   RBC 3.35 (*)    Hemoglobin 11.4 (*)    HCT 34.5 (*)    MCV 103.0 (*)    Abs Immature Granulocytes 0.08 (*)    All other components within normal limits  BRAIN NATRIURETIC PEPTIDE - Abnormal; Notable for the following components:   B Natriuretic Peptide 187.0 (*)    All other components within normal limits  TROPONIN I (HIGH SENSITIVITY) - Abnormal; Notable for the following components:   Troponin I (High Sensitivity) 43 (*)    All other components within normal  limits    EKG: EKG Interpretation Date/Time:  Monday February 20 2024 18:54:25 EDT Ventricular Rate:  149 PR Interval:    QRS Duration:  105 QT Interval:  279 QTC Calculation: 440 R Axis:   48  Text Interpretation: Supraventricular tachycardia Inferior infarct, acute (RCA) Lateral leads are also involved Probable RV involvement, suggest recording right precordial leads Confirmed  by Cleotilde Rogue ([id number]) on 02/20/2024 8:05:54 PM  Radiology: DG Chest Portable 1 View Result Date: 02/20/2024 CLINICAL DATA:  Tachycardia, shortness of breath and fatigue. EXAM: PORTABLE CHEST 1 VIEW COMPARISON:  Chest x-ray 10/09/2023 FINDINGS: The heart size and mediastinal contours are within normal limits. Both lungs are clear. The visualized skeletal structures are unremarkable. IMPRESSION: No active disease. Electronically Signed   By: Greig Pique M.D.   On: 02/20/2024 19:47     .Critical Care  Performed by: Cleotilde Rogue, MD Authorized by: Cleotilde Rogue, MD   Critical care provider statement:    Critical care time (minutes):  45   Critical care time was exclusive of:  Separately billable procedures and treating other patients and teaching time   Critical care was necessary to treat or prevent imminent or life-threatening deterioration of the following conditions:  Cardiac failure   Critical care was time spent personally by me on the following activities:  Development of treatment plan with patient or surrogate, discussions with consultants, evaluation of patient's response to treatment, examination of patient, obtaining history from patient or surrogate, review of old charts, re-evaluation of patient's condition, pulse oximetry, ordering and review of radiographic studies, ordering and review of laboratory studies and ordering and performing treatments and interventions   I assumed direction of critical care for this patient from another provider in my specialty: no     Care discussed with: admitting  provider   Comments:          Medications Ordered in the ED  diltiazem  (CARDIZEM ) 125 mg in dextrose  5% 125 mL (1 mg/mL) infusion (12.5 mg/hr Intravenous Rate/Dose Change 02/20/24 2014)  diltiazem  (CARDIZEM ) injection 10 mg (10 mg Intravenous Given 02/20/24 1935)                                    Medical Decision Making Amount and/or Complexity of Data Reviewed Labs: ordered. Radiology: ordered.  Risk Prescription drug management. Decision regarding hospitalization.    This patient presents to the ED for concern of tachycardia, this involves an extensive number of treatment options, and is a complaint that carries with it a high risk of complications and morbidity.  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  The pertinent results include: CBC and metabolic panel reviewed, creatinine of 1.5 seems close to baseline.  Troponin of 43 and a BNP of 180.   Imaging Studies ordered:  I ordered imaging studies including chest x-ray I independently visualized and interpreted imaging which showed no acute findings I agree with the radiologist interpretation   Cardiac Monitoring: / EKG:  The patient was maintained on a cardiac monitor.  I personally viewed and interpreted the cardiac monitored which showed an underlying rhythm of: Atrial flutter with 2-1 block with a heart rate that is essentially 150 without any variation   Problem List /  ED Course / Critical interventions / Medication management  This patient is critically ill with what  appears to be an uncontrolled supraventricular tachycardia, this despite getting ongoing Cardizem  injection bolus as well as a drip, we are titrating up on the drip but is not doing very much to the rate.  The patient will need to be admitted to the hospital for ongoing evaluation and treatment of this uncontrolled tachycardia.  That being said she is not hypotensive or shock appearing I have reviewed the patients home medicines and have made adjustments as needed   Consultations Obtained:  I requested consultation with the hospitalist,  and discussed lab and imaging findings as well as pertinent plan - they recommend: admission I did discuss the care with the cardiologist on-call who recommended that the patient get metoprolol  tartrate 25 mg p.o. every 6 hours as needed tachycardia if the diltiazem  is not working.   Social Determinants of Health:  Uncontrolled afib   Test / Admission - Considered:  admit      Final diagnoses:  Atrial flutter with rapid ventricular response [facility])  Renal insufficiency    ED Discharge Orders     None          Cleotilde Rogue, MD 02/20/24 2016    Cleotilde Rogue, MD 02/20/24 2206

## 2024-02-20 NOTE — ED Triage Notes (Signed)
 BIB family from home for tachycardia, HR 156. h/o afib. Sent by cards. Endorses weakness and arm heaviness. Has not had her BB yesterday or today b/c she could not get her BP to register. Alert, NAD, calm, interactive, steady gait with walker.

## 2024-02-20 NOTE — H&P (Signed)
 History and Physical    Patient: Felicia Frank FMW:[id number] DOB: Dec 20, 1945 DOA: 02/20/2024 DOS: the patient was seen and examined on 02/20/2024 PCP: Dino Devere RAMAN, MD  Patient coming from: Home  Chief Complaint:  Chief Complaint  Patient presents with   Tachycardia   HPI: Felicia Frank is a 78 y.o. female with medical history significant of hypertension, hypothyroidism  Atrial fibrilation, CKD 3A who presents to the emergency department due to sensation of chest tightness and bilateral arm heaviness which started yesterday in the morning, this was associated with low energy.  She checked her heart rate at home and it ranged within 145 to 160 bpm.  This improved on waking up this morning to 80, but this quickly increased again.  Apparently, patient has not been taking her beta-blocker due to low blood pressure.  She followed up with her kidney doctor at [facility], [place]  this morning and was seen by defusing assistant who recommended going to [facility] ED for evaluation and management. Patient was not on any anticoagulant at this time due to GI bleed and anemia that was stopped when she was initially placed on Eliquis .  She denies fever, chills, nausea, vomiting, abdominal pain.  ED Course:  In the emergency department, patient was tachypneic, tachycardic, but other vital signs were within normal range.  Workup in the ED showed macrocytic anemia, BMP shows sodium 135, potassium 3.4, chloride 104, bicarb 18, blood glucose 116, BUN/creatinine 36/1.51 (baseline creatinine 1.0-1.1).  Troponin 43 > 52.  BNP 187. Chest x-ray showed no active disease IV Cardizem  10 mg x 1 was given, patient was started on Cardizem  drip.  TRH was asked to admit patient  Review of Systems: Review of systems as noted in the HPI. All other systems reviewed and are negative.   Past Medical History:  Diagnosis Date   Anemia    Atrial fibrillation (HCC)    Chronic back pain    Chronic kidney  disease    Diverticulosis    History of fall    HTN (hypertension)    Hypothyroidism    Osteoporosis    Past Surgical History:  Procedure Laterality Date   ABDOMINAL HYSTERECTOMY     BACK SURGERY     CHOLECYSTECTOMY     COLECTOMY WITH COLOSTOMY CREATION/HARTMANN PROCEDURE N/A 10/13/2023   Procedure: PARTIAL COLECTOMY, WITH COLOSTOMY CREATION, PARTIAL SMALL BOWEL RESECTION, ILEOCECECTOMY;  Surgeon: Mavis Anes, MD;  Location: [facility];  Service: General;  Laterality: N/A;   ESOPHAGOGASTRODUODENOSCOPY N/A 10/21/2023   Procedure: EGD (ESOPHAGOGASTRODUODENOSCOPY);  Surgeon: Cindie Carlin POUR, DO;  Location: [facility];  Service: Endoscopy;  Laterality: N/A;   TONSILLECTOMY      Social History:  reports that she has never smoked. She has never used smokeless tobacco. She reports that she does not drink alcohol and does not use drugs.   Allergies  Allergen Reactions   Amlodipine Swelling   Clonidine Rash    Rash with patch only.  Okay to take pill    Family History  Problem Relation Age of Onset   Colon cancer Neg Hx    Colon polyps Neg Hx      Prior to Admission medications   Medication Sig Start Date End Date Taking? Authorizing Provider  acetaminophen  (TYLENOL ) 500 MG tablet Take 500-1,000 mg by mouth every 6 (six) hours as needed (pain.).   Yes [provider]  aspirin  EC 81 MG tablet Take 81 mg by mouth. 12/29/23  Yes [provider]  bumetanide  (BUMEX ) 1 MG  tablet Take 0.5-1 mg by mouth daily as needed (fluid retention.). 01/09/24  Yes [provider]  carvedilol  (COREG ) 25 MG tablet Take 25 mg by mouth in the morning and at bedtime. 12/15/23  Yes [provider]  cloNIDine (CATAPRES) 0.1 MG tablet Take 0.1 mg by mouth 3 (three) times daily as needed (HIGH BLOOD PRESSURE (BP > 150)). 12/29/23  Yes [provider]  doxazosin  (CARDURA ) 8 MG tablet Take 8 mg by mouth at bedtime. 12/29/23  Yes [provider]  famotidine   (PEPCID ) 40 MG tablet Take 40 mg by mouth in the morning. 12/29/23  Yes [provider]  hydrALAZINE (APRESOLINE) 25 MG tablet Take 25 mg by mouth in the morning and at bedtime. 02/09/24  Yes [provider]  levothyroxine  (SYNTHROID ) 100 MCG tablet Take 100 mcg by mouth daily before breakfast. 02/09/24  Yes [provider]  magnesium  oxide (MAG-OX) 400 MG tablet Take 400 mg by mouth daily. 11/22/23  Yes [provider]  spironolactone-hydrochlorothiazide (ALDACTAZIDE) 25-25 MG tablet Take 1 tablet by mouth as directed. Every other day as needed for Fluid   Yes [provider]  telmisartan (MICARDIS) 80 MG tablet Take 80 mg by mouth in the morning.   Yes [provider]  traMADol  (ULTRAM ) 50 MG tablet Take 50 mg by mouth every 12 (twelve) hours as needed (pain.).   Yes [provider]  zolpidem  (AMBIEN ) 10 MG tablet Take 10 mg by mouth at bedtime as needed.   Yes [provider]    Physical Exam: BP (!) 140/56   Pulse 70   Temp 97.9 F (36.6 C) (Oral)   Resp 20   Ht 5' 3 (1.6 m)   Wt 74.8 kg   SpO2 98%   BMI 29.23 kg/m   General: 78 y.o. year-old female well developed well nourished in no acute distress.  Alert and oriented x3. HEENT: NCAT, EOMI Neck: Supple, trachea medial Cardiovascular: Regular rate and rhythm with no rubs or gallops.  No thyromegaly or JVD noted.  No lower extremity edema. 2/4 pulses in all 4 extremities. Respiratory: Clear to auscultation with no wheezes or rales. Good inspiratory effort. Abdomen: Noted ostomy bag with normal drainage.  Soft, nontender nondistended with normal bowel sounds x4 quadrants. Muskuloskeletal: No cyanosis, clubbing or edema noted bilaterally Neuro: CN II-XII intact, strength 5/5 x 4, sensation, reflexes intact Skin: No ulcerative lesions noted or rashes Psychiatry: Judgement and insight appear normal. Mood is appropriate for condition and setting          Labs on  Admission:  Basic Metabolic Panel: Recent Labs  Lab 02/17/24 1112 02/20/24 1904  NA 135 135  K 5.2* 3.4*  CL 107 104  CO2 21* 18*  GLUCOSE 90 116*  BUN 43* 36*  CREATININE 1.73* 1.51*  CALCIUM 9.2 8.6*   Liver Function Tests: Recent Labs  Lab 02/20/24 1904  AST 22  ALT 39  ALKPHOS 75  BILITOT 0.6  PROT 6.2*  ALBUMIN  3.3*   No results for input(s): LIPASE, AMYLASE in the last 168 hours. No results for input(s): AMMONIA in the last 168 hours. CBC: Recent Labs  Lab 02/20/24 1904  WBC 9.9  NEUTROABS 6.9  HGB 11.4*  HCT 34.5*  MCV 103.0*  PLT 169   Cardiac Enzymes: No results for input(s): CKTOTAL, CKMB, CKMBINDEX, TROPONINI in the last 168 hours.  BNP (last 3 results) Recent Labs    10/18/23 0848 02/20/24 1904  BNP 85.0 187.0*  ProBNP (last 3 results) No results for input(s): PROBNP in the last 8760 hours.  CBG: No results for input(s): GLUCAP in the last 168 hours.  Radiological Exams on Admission: DG Chest Portable 1 View Result Date: 02/20/2024 CLINICAL DATA:  Tachycardia, shortness of breath and fatigue. EXAM: PORTABLE CHEST 1 VIEW COMPARISON:  Chest x-ray 10/09/2023 FINDINGS: The heart size and mediastinal contours are within normal limits. Both lungs are clear. The visualized skeletal structures are unremarkable. IMPRESSION: No active disease. Electronically Signed   By: Greig Pique M.D.   On: 02/20/2024 19:47    EKG: I independently viewed the EKG done and my findings are as followed: Atrial flutter with 2: 1 AV conduction  Assessment/Plan Present on Admission:  Paroxysmal atrial flutter (HCC)  Hypokalemia  Acute kidney injury superimposed on chronic kidney disease  Essential hypertension  Acquired hypothyroidism  GERD (gastroesophageal reflux disease)  Compression fracture of L1 lumbar vertebra (HCC)  Principal Problem:   Paroxysmal atrial flutter (HCC) Active Problems:   Essential hypertension   Acquired  hypothyroidism   GERD (gastroesophageal reflux disease)   Compression fracture of L1 lumbar vertebra (HCC)   Acute kidney injury superimposed on chronic kidney disease   Hypokalemia   Macrocytic anemia   Elevated troponin   Elevated brain natriuretic peptide (BNP) level   Hypoalbuminemia due to protein-calorie malnutrition  Paroxysmal atrial flutter Patient was treated with IV Cardizem  10 mg x 1 She was continued with IV Cardizem  drip She stopped taking her beta-blocker due to low blood pressure She is not on anticoagulant due to history of GI bleed Continue telemetry  Hypokalemia K+ 3.4, this will be replenished  Macrocytic anemia MCV 103.0; vitamin B12 and folate levels will be checked  Acute kidney injury on CKD 3A BUN/creatinine 36/1.51 (baseline creatinine 1.0-1.1).  Renally adjust medications, avoid nephrotoxic agents/dehydration/hypotension  Elevated troponin possibly secondary to type II demand ischemia Troponin 43 > 52, she denies chest pain Continue to monitor troponin  Elevated BNP BNP 187 Patient is hemodynamically stable and she does not appear to be fluid overloaded, but she complained of chest tightness which has since resolved Continue total input/output, daily weights and fluid restriction Echocardiogram done on 09/21/2023 showed LVEF of 65 to 70%.  No RWMA.  LV diastolic parameters indeterminate.  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  Continue ostomy bag care  GERD Continue famotidine   Compression fracture of L1 Continue  Tylenol  as needed Patient states that she has an appointment for surgical repair soon  DVT prophylaxis: SCDs  Code Status: Full code  Family Communication: None at bedside  Consults: None  Severity of Illness: The appropriate patient status for this patient is OBSERVATION. Observation status is judged to be reasonable and necessary in order to provide the required intensity of service to ensure the patient's safety. The patient's presenting symptoms, physical exam findings, and initial radiographic and laboratory data in the context of their medical condition is felt to place them at decreased risk for further clinical deterioration. Furthermore, it is anticipated that the patient will be medically stable for discharge from the hospital within 2 midnights of admission.   Author: Tonny Isensee, DO  02/20/2024 11:13 PM  For on call review [email or web address].

## 2024-02-20 NOTE — Progress Notes (Signed)
 Anesthesia Chart Review:  Case: [id number] Date/Time: 02/22/24 1215   Procedure: KYPHOPLASTY - KYPHOPLASTY L1   Anesthesia type: General   Diagnosis: Age-related osteoporosis with current pathological fracture of vertebra, initial encounter (HCC) [M80.08XA]   Pre-op diagnosis: Age-related osteoporosis with current pathological fracture of vertebra   [facility] ROOM [facility] / [facility]   Surgeons: Debby Dorn MATSU, MD       DISCUSSION: Patient is a 78 year old female scheduled for the above procedure.  History includes   Last cardiology visit with Dr. Rosalba was on 12/15/2023.   Her recent echo was okay and she had past stress testing that was normal.   She had labs on 02/07/2024 K 5.6, HGB 10.2. She was on a KCL supplement and it got too high.   She had some LE edema at last cardiology visit. She took 5 days and diuretic therapy with resolution. She did not have a venous US .   She was doing well at her PAT RN visit.   She didn't Didn't take any blood thinners after discharge. stopped ASA at discharge from hospital   Several evaluation for renal artery stenosis have been negative including MRI of abdomen several years ago and a cardiac cath in the past.  She sees a nephrologist in [place] for management of hypertension.  Stress 1220 was unremarkable.  Prior event monitor revealed several episodes of paroxysmal supraventricular tachycardia.  She is compliant with compression stockings during the daytime.  Carotid duplex 124 revealed mild disease bilaterally and echocardiogram 124 revealed normal biventricular function and mild pulmonary hypertension.  She titrates blood pressure medications if needed at home. . Perioperative A-fib, anticoagulation stopped due to bleeding.  No recurrence since hospitalization.  VS: BP (!) 131/49   Pulse 66   Temp 36.7 C   Resp 17   Ht 5' 3.5 (1.613 m)   Wt 75.1 kg   SpO2 100%   BMI 28.86 kg/m   PROVIDERS: [name], Devere RAMAN, MD is PCP   Rosalba Drape, MD is cardiologist ([facility])  LABS: {CHL AN LABS REVIEWED:112001::Labs reviewed: Acceptable for surgery.} (all labs ordered are listed, but only abnormal results are displayed)  Labs Reviewed  BASIC METABOLIC PANEL WITH GFR - Abnormal; Notable for the following components:      Result Value   Potassium 5.2 (*)    CO2 21 (*)    BUN 43 (*)    Creatinine, Ser 1.73 (*)    GFR, Estimated 30 (*)    All other components within normal limits  SURGICAL PCR SCREEN     IMAGES:   EKG: 02/17/2024: Sinus bradycardia at 56 bpm , short PR interval Possible Left atrial enlargement Low voltage QRS Poor anterior R wave progression When compared with ECG of 19-Oct-2023 16:09, rhythm is no longer atrial fib Confirmed by Lavona Agent ([id number]) on 02/17/2024 2:04:15 PM  CV:  Past Medical History:  Diagnosis Date   Anemia    Atrial fibrillation (HCC)    Chronic back pain    Chronic kidney disease    Diverticulosis    History of fall    HTN (hypertension)    Hypothyroidism    Osteoporosis     Past Surgical History:  Procedure Laterality Date   ABDOMINAL HYSTERECTOMY     BACK SURGERY     CHOLECYSTECTOMY     COLECTOMY WITH COLOSTOMY CREATION/HARTMANN PROCEDURE N/A 10/13/2023   Procedure: PARTIAL COLECTOMY, WITH COLOSTOMY CREATION, PARTIAL SMALL BOWEL RESECTION, ILEOCECECTOMY;  Surgeon: Mavis Anes, MD;  Location: [facility];  Service: General;  Laterality: N/A;   ESOPHAGOGASTRODUODENOSCOPY N/A 10/21/2023   Procedure: EGD (ESOPHAGOGASTRODUODENOSCOPY);  Surgeon: Cindie Carlin POUR, DO;  Location[facility];  Service: Endoscopy;  Laterality: N/A;   TONSILLECTOMY      MEDICATIONS:  spironolactone-hydrochlorothiazide (ALDACTAZIDE) 25-25 MG tablet   zolpidem  (AMBIEN ) 5 MG tablet   acetaminophen  (TYLENOL ) 500 MG tablet   bumetanide  (BUMEX ) 1 MG tablet   carvedilol  (COREG ) 25 MG tablet   cloNIDine (CATAPRES) 0.1 MG tablet   doxazosin  (CARDURA ) 8 MG  tablet   famotidine  (PEPCID ) 40 MG tablet   hydrALAZINE (APRESOLINE) 25 MG tablet   levothyroxine  (SYNTHROID ) 100 MCG tablet   magnesium  oxide (MAG-OX) 400 MG tablet   telmisartan (MICARDIS) 80 MG tablet   traMADol  (ULTRAM ) 50 MG tablet   No current facility-administered medications for this encounter.

## 2024-02-21 ENCOUNTER — Other Ambulatory Visit (HOSPITAL_COMMUNITY)

## 2024-02-21 ENCOUNTER — Other Ambulatory Visit (HOSPITAL_COMMUNITY): Payer: Self-pay | Admitting: *Deleted

## 2024-02-21 ENCOUNTER — Encounter (HOSPITAL_COMMUNITY): Payer: Self-pay | Admitting: Internal Medicine

## 2024-02-21 DIAGNOSIS — I10 Essential (primary) hypertension: Secondary | ICD-10-CM | POA: Diagnosis not present

## 2024-02-21 DIAGNOSIS — I483 Typical atrial flutter: Secondary | ICD-10-CM | POA: Diagnosis not present

## 2024-02-21 DIAGNOSIS — R7989 Other specified abnormal findings of blood chemistry: Secondary | ICD-10-CM | POA: Diagnosis not present

## 2024-02-21 DIAGNOSIS — I4892 Unspecified atrial flutter: Secondary | ICD-10-CM | POA: Diagnosis not present

## 2024-02-21 LAB — CBC
HCT: 32.4 % — ABNORMAL LOW (ref 36.0–46.0)
Hemoglobin: 10.8 g/dL — ABNORMAL LOW (ref 12.0–15.0)
MCH: 34.4 pg — ABNORMAL HIGH (ref 26.0–34.0)
MCHC: 33.3 g/dL (ref 30.0–36.0)
MCV: 103.2 fL — ABNORMAL HIGH (ref 80.0–100.0)
Platelets: 146 K/uL — ABNORMAL LOW (ref 150–400)
RBC: 3.14 MIL/uL — ABNORMAL LOW (ref 3.87–5.11)
RDW: 13.7 % (ref 11.5–15.5)
WBC: 8.5 K/uL (ref 4.0–10.5)
nRBC: 0 % (ref 0.0–0.2)

## 2024-02-21 LAB — PHOSPHORUS: Phosphorus: 2.5 mg/dL (ref 2.5–4.6)

## 2024-02-21 LAB — COMPREHENSIVE METABOLIC PANEL WITH GFR
ALT: 34 U/L (ref 0–44)
AST: 20 U/L (ref 15–41)
Albumin: 2.9 g/dL — ABNORMAL LOW (ref 3.5–5.0)
Alkaline Phosphatase: 68 U/L (ref 38–126)
Anion gap: 11 (ref 5–15)
BUN: 33 mg/dL — ABNORMAL HIGH (ref 8–23)
CO2: 21 mmol/L — ABNORMAL LOW (ref 22–32)
Calcium: 8.5 mg/dL — ABNORMAL LOW (ref 8.9–10.3)
Chloride: 105 mmol/L (ref 98–111)
Creatinine, Ser: 1.34 mg/dL — ABNORMAL HIGH (ref 0.44–1.00)
GFR, Estimated: 41 mL/min — ABNORMAL LOW (ref >60)
Glucose, Bld: 104 mg/dL — ABNORMAL HIGH (ref 70–99)
Potassium: 3.5 mmol/L (ref 3.5–5.1)
Sodium: 137 mmol/L (ref 135–145)
Total Bilirubin: 0.8 mg/dL (ref 0.0–1.2)
Total Protein: 5.7 g/dL — ABNORMAL LOW (ref 6.5–8.1)

## 2024-02-21 LAB — FOLATE: Folate: 12.5 ng/mL (ref >5.9)

## 2024-02-21 LAB — MAGNESIUM: Magnesium: 1.5 mg/dL — ABNORMAL LOW (ref 1.7–2.4)

## 2024-02-21 LAB — VITAMIN B12: Vitamin B-12: <150 pg/mL — ABNORMAL LOW (ref 180–914)

## 2024-02-21 LAB — MRSA NEXT GEN BY PCR, NASAL: MRSA by PCR Next Gen: NOT DETECTED

## 2024-02-21 LAB — TROPONIN I (HIGH SENSITIVITY)
Troponin I (High Sensitivity): 105 ng/L (ref <18)
Troponin I (High Sensitivity): 118 ng/L (ref <18)
Troponin I (High Sensitivity): 85 ng/L — ABNORMAL HIGH (ref <18)
Troponin I (High Sensitivity): 93 ng/L — ABNORMAL HIGH (ref <18)

## 2024-02-21 LAB — TSH: TSH: 2.503 u[IU]/mL (ref 0.350–4.500)

## 2024-02-21 MED ORDER — CARVEDILOL 12.5 MG PO TABS
25.0000 mg | ORAL_TABLET | Freq: Two times a day (BID) | ORAL | Status: DC
Start: 2024-02-21 — End: 2024-02-21
  Filled 2024-02-21: qty 2

## 2024-02-21 MED ORDER — MAGNESIUM SULFATE 2 GM/50ML IV SOLN
2.0000 g | Freq: Once | INTRAVENOUS | Status: AC
  Administered 2024-02-21: 2 g via INTRAVENOUS
  Filled 2024-02-21: qty 50

## 2024-02-21 MED ORDER — CYANOCOBALAMIN 1000 MCG/ML IJ SOLN
1000.0000 ug | Freq: Once | INTRAMUSCULAR | Status: AC
Start: 2024-02-21 — End: 2024-02-21
  Administered 2024-02-21: 1000 ug via INTRAMUSCULAR
  Filled 2024-02-21: qty 1

## 2024-02-21 MED ORDER — LEVOTHYROXINE SODIUM 100 MCG PO TABS
100.0000 ug | ORAL_TABLET | Freq: Every day | ORAL | Status: DC
  Administered 2024-02-21 – 2024-03-02 (×11): 100 ug via ORAL
  Filled 2024-02-21: qty 2
  Filled 2024-02-21 (×10): qty 1

## 2024-02-21 MED ORDER — AMIODARONE HCL IN DEXTROSE 360-4.14 MG/200ML-% IV SOLN
60.0000 mg/h | INTRAVENOUS | Status: AC
  Administered 2024-02-21 (×2): 60 mg/h via INTRAVENOUS
  Filled 2024-02-21: qty 200

## 2024-02-21 MED ORDER — AMIODARONE LOAD VIA INFUSION
150.0000 mg | Freq: Once | INTRAVENOUS | Status: AC
  Administered 2024-02-21: 150 mg via INTRAVENOUS
  Filled 2024-02-21: qty 83.34

## 2024-02-21 MED ORDER — MAGNESIUM OXIDE -MG SUPPLEMENT 400 (240 MG) MG PO TABS
400.0000 mg | ORAL_TABLET | Freq: Every day | ORAL | Status: DC
  Administered 2024-02-22 – 2024-03-02 (×10): 400 mg via ORAL
  Filled 2024-02-21 (×10): qty 1

## 2024-02-21 MED ORDER — CHLORHEXIDINE GLUCONATE CLOTH 2 % EX PADS
6.0000 | MEDICATED_PAD | Freq: Every day | CUTANEOUS | Status: DC
  Administered 2024-02-22 – 2024-03-01 (×8): 6 via TOPICAL

## 2024-02-21 MED ORDER — MAGNESIUM OXIDE 400 MG PO TABS
400.0000 mg | ORAL_TABLET | Freq: Every day | ORAL | Status: DC
Start: 2024-02-21 — End: 2024-02-21
  Filled 2024-02-21: qty 1

## 2024-02-21 MED ORDER — ASPIRIN 81 MG PO TBEC
81.0000 mg | DELAYED_RELEASE_TABLET | Freq: Every day | ORAL | Status: DC
  Administered 2024-02-21 – 2024-02-24 (×4): 81 mg via ORAL
  Filled 2024-02-21 (×4): qty 1

## 2024-02-21 MED ORDER — CARVEDILOL 12.5 MG PO TABS
25.0000 mg | ORAL_TABLET | Freq: Two times a day (BID) | ORAL | Status: DC
Start: 2024-02-21 — End: 2024-02-21
  Administered 2024-02-21: 25 mg via ORAL
  Filled 2024-02-21: qty 2

## 2024-02-21 MED ORDER — DILTIAZEM HCL-DEXTROSE 125-5 MG/125ML-% IV SOLN (PREMIX): 5.0000 mg/h | INTRAVENOUS | Status: DC

## 2024-02-21 MED ORDER — HYDROMORPHONE HCL 1 MG/ML IJ SOLN
0.5000 mg | Freq: Once | INTRAMUSCULAR | Status: AC | PRN
Start: 2024-02-21 — End: 2024-02-25
  Administered 2024-02-25: 0.5 mg via INTRAVENOUS
  Filled 2024-02-21 (×2): qty 0.5

## 2024-02-21 MED ORDER — AMIODARONE HCL IN DEXTROSE 360-4.14 MG/200ML-% IV SOLN
30.0000 mg/h | INTRAVENOUS | Status: DC
  Administered 2024-02-21 (×2): 30 mg/h via INTRAVENOUS
  Filled 2024-02-21 (×2): qty 200

## 2024-02-21 MED ORDER — FAMOTIDINE 20 MG PO TABS
40.0000 mg | ORAL_TABLET | Freq: Every day | ORAL | Status: DC
Start: 2024-02-21 — End: 2024-02-24
  Administered 2024-02-21 – 2024-02-24 (×4): 40 mg via ORAL
  Filled 2024-02-21 (×4): qty 2

## 2024-02-21 MED ORDER — BUMETANIDE 0.5 MG PO TABS: 0.5000 mg | ORAL_TABLET | Freq: Every day | ORAL | Status: DC | PRN

## 2024-02-21 MED ORDER — LABETALOL HCL 5 MG/ML IV SOLN
10.0000 mg | INTRAVENOUS | Status: DC | PRN
  Administered 2024-02-22 – 2024-02-26 (×2): 10 mg via INTRAVENOUS
  Filled 2024-02-21 (×2): qty 4

## 2024-02-21 NOTE — ED Notes (Signed)
 Adefeso, DO paged & discussed pt HR being elevated once again w/ this RN. New EKG exported in chart.

## 2024-02-21 NOTE — ED Notes (Addendum)
 IV Cardizem  drip titrated down as HR is starting to come down from 150s bpm to the 80s bpm

## 2024-02-21 NOTE — ED Notes (Signed)
 Pt HR noted to be back up in the 150s after titrating Cardizem  down. Pt titrated back up to 15mg .

## 2024-02-21 NOTE — ED Notes (Signed)
 Dr Debera and Dr. Adriana aware of pt's HR 55

## 2024-02-21 NOTE — ED Notes (Signed)
 Pt is on maximum dose of IV cardizem  drip at 15 mL/hr--HR remains in the 150s at this time, MD/cardiology at bedside

## 2024-02-21 NOTE — Hospital Course (Addendum)
 Shany Kotas is a 78 y.o. female with medical history significant of HTN, hypothyroidism, Atrial fibrilation, CKD 3A who presents to the emergency department due to sensation of chest tightness and bilateral arm heaviness which started yesterday in the morning, this was associated with low energy.  She checked her heart rate at home and it ranged within 145 to 160 bpm.  This improved on waking up this morning to 80, but this quickly increased again.  Apparently, patient has not been taking her beta-blocker due to low blood pressure. Patient was not on any anticoagulant at this time due to GI bleed and anemia that was stopped when she was initially placed on Eliquis .   ED Course:  Patient was tachypneic, tachycardic, but other vital signs were within normal range.  Workup in the ED showed macrocytic anemia, BMP shows sodium 135, potassium 3.4, chloride 104, bicarb 18, blood glucose 116, BUN/creatinine 36/1.51 (baseline creatinine 1.0-1.1).  Troponin 43 > 52.  BNP 187. Chest x-ray showed no active disease IV Cardizem  10 mg x 1 was given, patient was started on Cardizem  drip.  TRH was asked to admit patient

## 2024-02-21 NOTE — TOC CM/SW Note (Signed)
 Transition of Care [facility]) - Inpatient Brief Assessment   Patient Details  Name: Felicia Frank MRN: [id number] Date of Birth: 1945-06-17  Transition of Care [facility]) CM/SW Contact:    Lucie Lunger, LCSWA Phone Number: 02/21/2024, 9:18 AM   Clinical Narrative: Transition of Care [facility]) has reviewed patient and no TOC needs have been identified at this time. We will continue to monitor patient advancement through interdiciplinary progression rounds. If new patient transition needs arise, please place a TOC consult.  Transition of Care Asessment: Insurance and Status: Insurance coverage has been reviewed Patient has primary care physician: Yes Home environment has been reviewed: From home Prior level of function:: Independent Prior/Current Home Services: No current home services Social Drivers of Health Review: SDOH reviewed no interventions necessary Readmission risk has been reviewed: Yes Transition of care needs: no transition of care needs at this time

## 2024-02-21 NOTE — Progress Notes (Signed)
 eLink Physician-Brief Progress Note Patient Name: Jennalyn Cawley DOB: Oct 05, 1945 MRN: [id number]   Date of Service  02/21/2024  HPI/Events of Note  SBP 190  eICU Interventions  PRN iv Labetalol  order entered.        Helix Lafontaine U Satine Hausner 02/21/2024, 11:44 PM

## 2024-02-21 NOTE — Progress Notes (Signed)
 Patient was admitted due to paroxysmal atrial flutter and she was placed on Cardizem  drip which was turned off in the ED when she converted.  Unfortunately, patient went back into A-fib with RVR and complaining of palpitations but no chest pain.  Troponin also increased but patient continues to deny any chest pain.  This may still be due to type II demand ischemia.

## 2024-02-21 NOTE — ED Notes (Signed)
 Per cardiologist, keep pt on IV Cardizem  drip and also start the IV amiodarone  drip--if pt converts, pt can be taken off the Cardizem  drip

## 2024-02-21 NOTE — Progress Notes (Signed)
 Patient arrived to ICU room 3. Amiodarone  gtt infusing at 60mg /hr. Patient placed on ICU tele, noted to be in sinus bradycardia. EKG completed.

## 2024-02-21 NOTE — ED Notes (Signed)
 Assisted pt to BR and back to room, pt with use of walker

## 2024-02-21 NOTE — Consult Note (Signed)
 CARDIOLOGY CONSULTATION  Patient ID: Felicia Frank; [id number]; December 29, 1945   Admit date: 02/20/2024 Date of Consult: 02/21/2024  Primary Care Provider: Dino Devere RAMAN, MD Primary Cardiologist: [name], MD  HISTORY OF PRESENT ILLNESS  Felicia Frank is a 78 y.o. female with past medical history outlined below, now presenting to the [facility], ER with fatigue and palpitations.  She tells me that she began to feel more tired with activity last week, on Sunday prior to going to church she checked her heart rate at home finding it to be 150 bpm.  She tells me that she went to her cardiologist's office in [place] this week (Dr. BLACKSMITH) where she was apparently found to be in atrial flutter and told to go to the ER.  I do not have these records.  She was started on intravenous diltiazem  by ER staff here, spontaneously converted to sinus rhythm initially, however returned to typical atrial flutter with RVR ultimately.  Cardiac enzymes are mildly elevated, high-sensitivity troponin I peaked at 118 most consistent with demand ischemia.  She does not report any chest pain and ECG shows no acute ST segment changes.  We went over her home medications.  States that she was not taking Coreg  since this weekend due to low blood pressures.  She had been on amiodarone  when she was seen by our cardiology practice back in May for treatment of paroxysmal atrial fibrillation, however at some point this medicine was discontinued, details are not clear.  CHA2DS2-VASc score is 4, however she is not anticoagulated given history of hemorrhagic shock requiring PRBC transfusions.  ROS  Pertinent review in history of present illness.  No syncope.  No orthopnea or PND.  No recent cough, no fevers or chills.  Past Medical History:  Diagnosis Date   Anemia    Atrial fibrillation (HCC)    Chronic back pain    Chronic kidney disease    Diverticulosis    History of fall    HTN (hypertension)     Hypothyroidism    Osteoporosis     Past Surgical History:  Procedure Laterality Date   ABDOMINAL HYSTERECTOMY     BACK SURGERY     CHOLECYSTECTOMY     COLECTOMY WITH COLOSTOMY CREATION/HARTMANN PROCEDURE N/A 10/13/2023   Procedure: PARTIAL COLECTOMY, WITH COLOSTOMY CREATION, PARTIAL SMALL BOWEL RESECTION, ILEOCECECTOMY;  Surgeon: Mavis Anes, MD;  Location: [facility];  Service: General;  Laterality: N/A;   ESOPHAGOGASTRODUODENOSCOPY N/A 10/21/2023   Procedure: EGD (ESOPHAGOGASTRODUODENOSCOPY);  Surgeon: Cindie Carlin POUR, DO;  Location: [facility];  Service: Endoscopy;  Laterality: N/A;   TONSILLECTOMY       INPATIENT MEDICATIONS Scheduled Meds:  aspirin  EC  81 mg Oral Daily   carvedilol   25 mg Oral BID WC   cyanocobalamin   1,000 mcg Intramuscular Once   famotidine   40 mg Oral Daily   feeding supplement  237 mL Oral BID BM   levothyroxine   100 mcg Oral QAC breakfast   [START ON 02/22/2024] magnesium  oxide  400 mg Oral Daily   potassium chloride   40 mEq Oral Once   Continuous Infusions:  diltiazem  (CARDIZEM ) infusion 15 mg/hr (02/21/24 0258)   magnesium  sulfate bolus IVPB     PRN Meds: acetaminophen  **OR** acetaminophen , bumetanide , ondansetron  **OR** ondansetron  (ZOFRAN ) IV  ALLERGIES Allergies  Allergen Reactions   Amlodipine Swelling   Clonidine Rash    Rash with patch only.  Okay to take pill    SOCIAL HISTORY  Social History   Tobacco  Use   Smoking status: Never   Smokeless tobacco: Never  Substance Use Topics   Alcohol use: Never    FAMILY HISTORY   The patient's family history includes Hypertension in her father. There is no history of Colon cancer or Colon polyps.  PHYSICAL EXAM & DATA  Vitals:   02/21/24 0659 02/21/24 0700 02/21/24 0724 02/21/24 0800  BP:  133/73  129/65  Pulse: (!) 147 (!) 147 (!) 152 (!) 153  Resp: 20 16 (!) 21 18  Temp:      TempSrc:      SpO2: 95% 97% 97% 97%  Weight:      Height:        Intake/Output Summary (Last 24  hours) at 02/21/2024 0824 Last data filed at 02/21/2024 0631 Gross per 24 hour  Intake 50 ml  Output --  Net 50 ml   Filed Weights   02/20/24 1844  Weight: 74.8 kg   Body mass index is 29.23 kg/m.   Gen: No acute distress. HEENT: Conjunctiva and lids normal. Neck: Supple, no elevated JVP or carotid bruits. Lungs: Clear to auscultation, nonlabored breathing at rest. Cardiac: Rapid regular rhythm without obvious gallop. Abdomen: Soft, nontender, bowel sounds present. Extremities: No pitting edema, distal pulses 2+. Skin: Warm and dry. Musculoskeletal: No kyphosis. Neuropsychiatric: Alert and oriented x3, affect grossly appropriate.  EKG:  An ECG dated 02/21/2024 was personally reviewed today and demonstrated:  Typical atrial flutter with largely 2:1 block, decreased R wave progression.  Telemetry:  I personally reviewed telemetry which shows atrial flutter with 2:1 block.  RELEVANT CV STUDIES  Echocardiogram 09/21/2023:  1. Left ventricular ejection fraction, by estimation, is 65 to 70%. The  left ventricle has normal function. The left ventricle has no regional  wall motion abnormalities. Left ventricular diastolic parameters are  indeterminate.   2. Right ventricular systolic function is normal. The right ventricular  size is normal. Tricuspid regurgitation signal is inadequate for assessing  PA pressure.   3. The mitral valve is normal in structure. No evidence of mitral valve  regurgitation. No evidence of mitral stenosis.   4. The aortic valve is tricuspid. Aortic valve regurgitation is not  visualized. No aortic stenosis is present.   LABORATORY DATA  Chemistry Recent Labs  Lab 02/17/24 1112 02/20/24 1904 02/21/24 0325  NA 135 135 137  K 5.2* 3.4* 3.5  CL 107 104 105  CO2 21* 18* 21*  GLUCOSE 90 116* 104*  BUN 43* 36* 33*  CREATININE 1.73* 1.51* 1.34*  CALCIUM 9.2 8.6* 8.5*  GFRNONAA 30* 35* 41*  ANIONGAP 7 13 11     Recent Labs  Lab 02/20/24 1904  02/21/24 0325  PROT 6.2* 5.7*  ALBUMIN  3.3* 2.9*  AST 22 20  ALT 39 34  ALKPHOS 75 68  BILITOT 0.6 0.8   Hematology Recent Labs  Lab 02/20/24 1904 02/21/24 0325  WBC 9.9 8.5  RBC 3.35* 3.14*  HGB 11.4* 10.8*  HCT 34.5* 32.4*  MCV 103.0* 103.2*  MCH 34.0 34.4*  MCHC 33.0 33.3  RDW 13.9 13.7  PLT 169 146*   Cardiac Enzymes Recent Labs  Lab 02/20/24 2057 02/20/24 2300 02/21/24 0044 02/21/24 0325 02/21/24 0517  TROPONINIHS 52* 85* 105* 118* 93*   BNP Recent Labs  Lab 02/20/24 1904  BNP 187.0*     Lipid Panel     Component Value Date/Time   TRIG 751 (H) 10/24/2023 0312    RADIOLOGY/STUDIES DG Chest Portable 1 View Result Date: 02/20/2024 CLINICAL  DATA:  Tachycardia, shortness of breath and fatigue. EXAM: PORTABLE CHEST 1 VIEW COMPARISON:  Chest x-ray 10/09/2023 FINDINGS: The heart size and mediastinal contours are within normal limits. Both lungs are clear. The visualized skeletal structures are unremarkable. IMPRESSION: No active disease. Electronically Signed   By: Greig Pique M.D.   On: 02/20/2024 19:47    ASSESSMENT & PLAN  1.  Typical atrial flutter with RVR, largely 2:1 block.  Suspect paroxysmal to persistent at least since this past weekend.  She has a known history of paroxysmal atrial fibrillation with CHA2DS2-VASc score of 4.  Reports not taking Coreg  since Sunday given lower blood pressures.  Also no longer on amiodarone  which she had been on back in May for control of her arrhythmia.  She is not anticoagulated given prior history of hemorrhagic shock.  2.  Mildly elevated high-sensitivity troponin I levels, peak 118 consistent with demand ischemia in the setting of rapid heart rate.  She reports no chest pain.  3.  Primary hypertension.  Typical home regimen includes Coreg  25 mg twice daily, hydralazine 25 mg twice daily, Micardis 80 mg daily, and as needed clonidine 0.1 mg tablets.  4.  CKD stage III, creatinine 1.34 with GFR 41.  She follows  with nephrology.  5.  Hypothyroidism, on Synthroid  as an outpatient.  TSH normal at 2.5.  Discussed with hospitalist and nursing.  Plan to start IV amiodarone  which will hopefully aid in conversion back to sinus rhythm.  Can continue IV diltiazem  for now, but would stop when she converts to sinus rhythm.  Hold off on resuming Coreg .  Do not plan on anticoagulation as discussed above, therefore no plan for electrical cardioversion.  Hold off on echocardiogram for now.  For questions or updates, please contact [facility] Please consult [email or web address] for contact info under   Signed, Jayson Sierras, MD  02/21/2024 8:24 AM

## 2024-02-21 NOTE — Progress Notes (Signed)
 PROGRESS NOTE    Patient: Felicia Frank                            PCP: Dino Devere RAMAN, MD                    DOB: 04-Jul-1945            DOA: 02/20/2024 FMW:[id number]             DOS: 02/21/2024, 7:08 AM   LOS: 0 days   Date of Service: The patient was seen and examined on 02/21/2024  Subjective:   The patient was seen and examined this morning. Still in A-fib with RVR, on Cardizem  drip, denies any chest pain Blood pressure stable at 129/65, satting 97% on room air No issues overnight .  Brief Narrative:   Felicia Frank is a 78 y.o. female with medical history significant of HTN, hypothyroidism, Atrial fibrilation, CKD 3A who presents to the emergency department due to sensation of chest tightness and bilateral arm heaviness which started yesterday in the morning, this was associated with low energy.  She checked her heart rate at home and it ranged within 145 to 160 bpm.  This improved on waking up this morning to 80, but this quickly increased again.  Apparently, patient has not been taking her beta-blocker due to low blood pressure. Patient was not on any anticoagulant at this time due to GI bleed and anemia that was stopped when she was initially placed on Eliquis .   ED Course:  Patient was tachypneic, tachycardic, but other vital signs were within normal range.  Workup in the ED showed macrocytic anemia, BMP shows sodium 135, potassium 3.4, chloride 104, bicarb 18, blood glucose 116, BUN/creatinine 36/1.51 (baseline creatinine 1.0-1.1).  Troponin 43 > 52.  BNP 187. Chest x-ray showed no active disease IV Cardizem  10 mg x 1 was given, patient was started on Cardizem  drip.  [facility] was asked to admit patient      Assessment & Plan:   Principal Problem:   Paroxysmal atrial flutter (HCC) Active Problems:   Essential hypertension   Acquired hypothyroidism   GERD (gastroesophageal reflux disease)   Compression fracture of L1 lumbar vertebra (HCC)   Acute kidney injury  superimposed on chronic kidney disease   Hypokalemia   Macrocytic anemia   Elevated troponin   Elevated brain natriuretic peptide (BNP) level   Hypoalbuminemia due to protein-calorie malnutrition     Paroxysmal atrial flutter Patient was treated with IV Cardizem  10 mg x 1 Overnight switch to Amiodarone  drip, continue for heart rate < 100 She stopped taking her beta-blocker due to low blood pressure Resuming home medication of Coreg  25 mg She is not on anticoagulant due to history of GI bleed -Cardiology consulted, appreciate further evaluation recommendations   Hypokalemia/hypomagnesia Maintaining serum K > 4.0, magnesium >2.0 Repleting K and magnesium    Macrocytic anemia/B12 deficiency MCV 103.0; B12< 150 low , folate 12.5-normal -B12 supplement IM x 1 today     Acute kidney injury on CKD 3A BUN/creatinine 36/1.51 (baseline creatinine 1.0-1.1).  Renally adjust medications, avoid nephrotoxic agents/dehydration/hypotension   Elevated troponin possibly secondary to type II demand ischemia Troponin 43 > 52 > 85 > 105, - she denies chest pain Continue to monitor troponin, EKG as needed   Elevated BNP BNP 187  No respiratory distress, hemodynamically stable, no signs of fluid overload  Continue total input/output, daily weights and fluid restriction Echocardiogram  done on 09/21/2023 showed LVEF of 65 to 70%.   No RWMA.  LV diastolic parameters indeterminate.   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  Continue ostomy bag care   GERD Continue famotidine    Compression fracture of L1 Continue Tylenol  as needed Patient states that she has an appointment for surgical repair soon   ------------------------------------------------------------------------------------------------ Nutritional status:  The patient's BMI is: Body mass index is 29.23 kg/m. I agree with the assessment and plan as outlined ------------------------------------------------------------------------------------------------  DVT prophylaxis:  SCDs Start: 02/20/24 2308   Code Status:   Code Status: Full Code  Family Communication: No family member present at bedside-  -Advance care planning has been discussed.   Admission status:   Status is: Observation The patient remains OBS appropriate and will d/c before 2 midnights.   Disposition: From  - home             Planning for discharge in 1-2 days   Procedures:   No admission procedures for hospital encounter.   Antimicrobials:  Anti-infectives (From admission, onward)    None        Medication:   aspirin  EC  81 mg Oral Daily   carvedilol   25 mg Oral BID WC   feeding supplement  237 mL Oral BID BM   magnesium  oxide  400 mg Oral Daily   potassium chloride   40 mEq Oral Once    acetaminophen  **OR** acetaminophen , bumetanide , ondansetron  **OR** ondansetron  (ZOFRAN ) IV   Objective:   Vitals:   02/21/24 0640 02/21/24 0645 02/21/24 0658 02/21/24 0659  BP:      Pulse: 82 (!) 150 (!) 148 (!) 147  Resp: (!) 21 (!) 22 (!) 21 20  Temp:      TempSrc:      SpO2: 96% 95% 95% 95%  Weight:      Height:        Intake/Output Summary (Last 24 hours) at 02/21/2024 0708 Last data filed at 02/21/2024 0631 Gross per 24 hour  Intake 50 ml  Output --  Net 50 ml   Filed Weights   02/20/24 1844  Weight: 74.8 kg     Physical examination:   General:  AAO x 3,  cooperative, no distress;   HEENT:  Normocephalic, PERRL,  otherwise with in Normal limits   Neuro:  CNII-XII intact. , normal motor and sensation, reflexes intact   Lungs:   Clear to auscultation BL, Respirations unlabored,  No wheezes / crackles  Cardio:    S1/S2, RRR, No murmure, No Rubs or Gallops   Abdomen:  Soft, non-tender, bowel sounds active all four quadrants, no guarding or peritoneal signs.  Muscular  skeletal:  Limited exam -global generalized weaknesses - in bed, able to move all 4 extremities,   2+ pulses,  symmetric, No pitting edema  Skin:  Dry, warm to touch, negative for any Rashes,  Wounds: Please see nursing documentation       ------------------------------------------------------------------------------------------------------------------------------------------    LABs:     Latest Ref Rng & Units 02/21/2024    3:25 AM 02/20/2024    7:04 PM 10/26/2023  4:35 AM  CBC  WBC 4.0 - 10.5 K/uL 8.5  9.9  5.3   Hemoglobin 12.0 - 15.0 g/dL 89.1  88.5  88.3   Hematocrit 36.0 - 46.0 % 32.4  34.5  35.5   Platelets 150 - 400 K/uL 146  169  151       Latest Ref Rng & Units 02/21/2024    3:25 AM 02/20/2024    7:04 PM 02/17/2024   11:12 AM  CMP  Glucose 70 - 99 mg/dL 895  883  90   BUN 8 - 23 mg/dL 33  36  43   Creatinine 0.44 - 1.00 mg/dL 8.65  8.48  8.26   Sodium 135 - 145 mmol/L 137  135  135   Potassium 3.5 - 5.1 mmol/L 3.5  3.4  5.2   Chloride 98 - 111 mmol/L 105  104  107   CO2 22 - 32 mmol/L 21  18  21    Calcium 8.9 - 10.3 mg/dL 8.5  8.6  9.2   Total Protein 6.5 - 8.1 g/dL 5.7  6.2    Total Bilirubin 0.0 - 1.2 mg/dL 0.8  0.6    Alkaline Phos 38 - 126 U/L 68  75    AST 15 - 41 U/L 20  22    ALT 0 - 44 U/L 34  39         Micro Results Recent Results (from the past 240 hours)  Surgical pcr screen     Status: None   Collection Time: 02/17/24 11:12 AM   Specimen: Nasal Mucosa; Nasal Swab  Result Value Ref Range Status   MRSA, PCR NEGATIVE NEGATIVE Final   Staphylococcus aureus NEGATIVE NEGATIVE Final     Comment: (NOTE) The Xpert SA Assay (FDA approved for NASAL specimens in patients 85 years of age and older), is one component of a comprehensive surveillance program. It is not intended to diagnose infection nor to guide or monitor treatment. Performed at [facility], [address], [place], [place] [address]     Radiology Reports DG Chest Portable 1 View Result Date: 02/20/2024 CLINICAL DATA:  Tachycardia, shortness of breath and fatigue. EXAM: PORTABLE CHEST 1 VIEW COMPARISON:  Chest x-ray 10/09/2023 FINDINGS: The heart size and mediastinal contours are within normal limits. Both lungs are clear. The visualized skeletal structures are unremarkable. IMPRESSION: No active disease. Electronically Signed   By: Greig Pique M.D.   On: 02/20/2024 19:47    SIGNED: Adriana DELENA Grams, MD, FHM. FAAFP. Jol[facility] - [facility] Time spent - 55 min.  In seeing, evaluating and examining the patient. Reviewing medical records, labs, drawn plan of care. [facility],  Pager (please use [email or web address] to page/ text) Please use Epic Secure Chat for non-urgent communication (7AM-7PM)  If 7PM-7AM, please contact night-coverage [email or web address], 02/21/2024, 7:08 AM

## 2024-02-22 ENCOUNTER — Encounter (HOSPITAL_COMMUNITY): Admission: RE | Payer: Self-pay | Source: Home / Self Care

## 2024-02-22 ENCOUNTER — Ambulatory Visit (HOSPITAL_COMMUNITY): Admission: RE | Admit: 2024-02-22 | Source: Home / Self Care

## 2024-02-22 DIAGNOSIS — Z79899 Other long term (current) drug therapy: Secondary | ICD-10-CM | POA: Diagnosis not present

## 2024-02-22 DIAGNOSIS — D649 Anemia, unspecified: Secondary | ICD-10-CM | POA: Diagnosis not present

## 2024-02-22 DIAGNOSIS — K219 Gastro-esophageal reflux disease without esophagitis: Secondary | ICD-10-CM | POA: Diagnosis present

## 2024-02-22 DIAGNOSIS — D539 Nutritional anemia, unspecified: Secondary | ICD-10-CM | POA: Diagnosis present

## 2024-02-22 DIAGNOSIS — I483 Typical atrial flutter: Secondary | ICD-10-CM

## 2024-02-22 DIAGNOSIS — R578 Other shock: Secondary | ICD-10-CM | POA: Diagnosis not present

## 2024-02-22 DIAGNOSIS — K2970 Gastritis, unspecified, without bleeding: Secondary | ICD-10-CM | POA: Diagnosis present

## 2024-02-22 DIAGNOSIS — Z7982 Long term (current) use of aspirin: Secondary | ICD-10-CM | POA: Diagnosis not present

## 2024-02-22 DIAGNOSIS — Z933 Colostomy status: Secondary | ICD-10-CM | POA: Diagnosis not present

## 2024-02-22 DIAGNOSIS — E8809 Other disorders of plasma-protein metabolism, not elsewhere classified: Secondary | ICD-10-CM | POA: Diagnosis present

## 2024-02-22 DIAGNOSIS — R7989 Other specified abnormal findings of blood chemistry: Secondary | ICD-10-CM | POA: Diagnosis not present

## 2024-02-22 DIAGNOSIS — E039 Hypothyroidism, unspecified: Secondary | ICD-10-CM | POA: Diagnosis present

## 2024-02-22 DIAGNOSIS — M4856XA Collapsed vertebra, not elsewhere classified, lumbar region, initial encounter for fracture: Secondary | ICD-10-CM | POA: Diagnosis present

## 2024-02-22 DIAGNOSIS — E876 Hypokalemia: Secondary | ICD-10-CM | POA: Diagnosis present

## 2024-02-22 DIAGNOSIS — Z79890 Hormone replacement therapy (postmenopausal): Secondary | ICD-10-CM | POA: Diagnosis not present

## 2024-02-22 DIAGNOSIS — K6389 Other specified diseases of intestine: Secondary | ICD-10-CM | POA: Diagnosis not present

## 2024-02-22 DIAGNOSIS — I129 Hypertensive chronic kidney disease with stage 1 through stage 4 chronic kidney disease, or unspecified chronic kidney disease: Secondary | ICD-10-CM | POA: Diagnosis present

## 2024-02-22 DIAGNOSIS — Z90710 Acquired absence of both cervix and uterus: Secondary | ICD-10-CM | POA: Diagnosis not present

## 2024-02-22 DIAGNOSIS — D519 Vitamin B12 deficiency anemia, unspecified: Secondary | ICD-10-CM | POA: Diagnosis present

## 2024-02-22 DIAGNOSIS — M810 Age-related osteoporosis without current pathological fracture: Secondary | ICD-10-CM | POA: Diagnosis present

## 2024-02-22 DIAGNOSIS — N189 Chronic kidney disease, unspecified: Secondary | ICD-10-CM

## 2024-02-22 DIAGNOSIS — I21A1 Myocardial infarction type 2: Secondary | ICD-10-CM | POA: Diagnosis present

## 2024-02-22 DIAGNOSIS — I480 Paroxysmal atrial fibrillation: Secondary | ICD-10-CM | POA: Diagnosis present

## 2024-02-22 DIAGNOSIS — Z8249 Family history of ischemic heart disease and other diseases of the circulatory system: Secondary | ICD-10-CM | POA: Diagnosis not present

## 2024-02-22 DIAGNOSIS — N289 Disorder of kidney and ureter, unspecified: Secondary | ICD-10-CM | POA: Diagnosis present

## 2024-02-22 DIAGNOSIS — D62 Acute posthemorrhagic anemia: Secondary | ICD-10-CM | POA: Diagnosis not present

## 2024-02-22 DIAGNOSIS — Z789 Other specified health status: Secondary | ICD-10-CM | POA: Diagnosis not present

## 2024-02-22 DIAGNOSIS — R002 Palpitations: Secondary | ICD-10-CM | POA: Diagnosis present

## 2024-02-22 DIAGNOSIS — Z888 Allergy status to other drugs, medicaments and biological substances status: Secondary | ICD-10-CM | POA: Diagnosis not present

## 2024-02-22 DIAGNOSIS — N1832 Chronic kidney disease, stage 3b: Secondary | ICD-10-CM | POA: Diagnosis present

## 2024-02-22 DIAGNOSIS — Z980 Intestinal bypass and anastomosis status: Secondary | ICD-10-CM | POA: Diagnosis not present

## 2024-02-22 DIAGNOSIS — I10 Essential (primary) hypertension: Secondary | ICD-10-CM | POA: Diagnosis not present

## 2024-02-22 DIAGNOSIS — N179 Acute kidney failure, unspecified: Secondary | ICD-10-CM | POA: Diagnosis not present

## 2024-02-22 DIAGNOSIS — K449 Diaphragmatic hernia without obstruction or gangrene: Secondary | ICD-10-CM | POA: Diagnosis present

## 2024-02-22 DIAGNOSIS — I4892 Unspecified atrial flutter: Secondary | ICD-10-CM | POA: Diagnosis not present

## 2024-02-22 LAB — BASIC METABOLIC PANEL WITH GFR
Anion gap: 9 (ref 5–15)
BUN: 25 mg/dL — ABNORMAL HIGH (ref 8–23)
CO2: 21 mmol/L — ABNORMAL LOW (ref 22–32)
Calcium: 8.1 mg/dL — ABNORMAL LOW (ref 8.9–10.3)
Chloride: 104 mmol/L (ref 98–111)
Creatinine, Ser: 1.31 mg/dL — ABNORMAL HIGH (ref 0.44–1.00)
GFR, Estimated: 42 mL/min — ABNORMAL LOW (ref >60)
Glucose, Bld: 102 mg/dL — ABNORMAL HIGH (ref 70–99)
Potassium: 3.2 mmol/L — ABNORMAL LOW (ref 3.5–5.1)
Sodium: 134 mmol/L — ABNORMAL LOW (ref 135–145)

## 2024-02-22 LAB — MAGNESIUM: Magnesium: 2.1 mg/dL (ref 1.7–2.4)

## 2024-02-22 SURGERY — KYPHOPLASTY
Anesthesia: General

## 2024-02-22 MED ORDER — IRBESARTAN 150 MG PO TABS
150.0000 mg | ORAL_TABLET | Freq: Every day | ORAL | Status: DC
Start: 2024-02-22 — End: 2024-03-02
  Administered 2024-02-22 – 2024-03-02 (×10): 150 mg via ORAL
  Filled 2024-02-22 (×10): qty 1

## 2024-02-22 MED ORDER — PROCHLORPERAZINE EDISYLATE 10 MG/2ML IJ SOLN
10.0000 mg | Freq: Four times a day (QID) | INTRAMUSCULAR | Status: DC | PRN
  Administered 2024-02-22 (×2): 10 mg via INTRAVENOUS
  Filled 2024-02-22 (×2): qty 2

## 2024-02-22 MED ORDER — POTASSIUM CHLORIDE CRYS ER 20 MEQ PO TBCR
40.0000 meq | EXTENDED_RELEASE_TABLET | Freq: Once | ORAL | Status: AC
Start: 2024-02-22 — End: 2024-02-22
  Administered 2024-02-22: 40 meq via ORAL
  Filled 2024-02-22: qty 2

## 2024-02-22 MED ORDER — AMIODARONE HCL 200 MG PO TABS
200.0000 mg | ORAL_TABLET | Freq: Two times a day (BID) | ORAL | Status: DC
  Administered 2024-02-22 (×2): 200 mg via ORAL
  Filled 2024-02-22 (×2): qty 1

## 2024-02-22 NOTE — Progress Notes (Signed)
 PROGRESS NOTE   Felicia Frank  FMW:[id number] DOB: June 12, 1945 DOA: 02/20/2024 PCP: Dino Devere RAMAN, MD   Chief Complaint  Patient presents with   Tachycardia   Level of care: Telemetry  Brief Admission History:  Felicia Frank is a 78 y.o. female with medical history significant of HTN, hypothyroidism, Atrial fibrilation, CKD 3A who presents to the emergency department due to sensation of chest tightness and bilateral arm heaviness which started yesterday in the morning, this was associated with low energy.  She checked her heart rate at home and it ranged within 145 to 160 bpm.  This improved on waking up this morning to 80, but this quickly increased again.  Apparently, patient has not been taking her beta-blocker due to low blood pressure. Patient was not on any anticoagulant at this time due to GI bleed and anemia that was stopped when she was initially placed on Eliquis .   ED Course:  Patient was tachypneic, tachycardic, but other vital signs were within normal range.  Workup in the ED showed macrocytic anemia, BMP shows sodium 135, potassium 3.4, chloride 104, bicarb 18, blood glucose 116, BUN/creatinine 36/1.51 (baseline creatinine 1.0-1.1).  Troponin 43 > 52.  BNP 187. Chest x-ray showed no active disease IV Cardizem  10 mg x 1 was given, patient was started on Cardizem  drip.  TRH was asked to admit patient  Assessment and Plan:  Paroxysmal atrial flutter with RVR Patient was treated with IV amiodarone  infusion She stopped taking her beta-blocker due to low blood pressure She is not on anticoagulant due to history of GI bleed -Cardiology consulted, appreciate further evaluation recommendations -pt now off IV amiodarone  on oral amiodarone  200 mg BID -avapro  150 mg daily started by cardiology -holding home carvedilol  for now    Hypokalemia/hypomagnesia Maintaining serum K > 4.0, magnesium >2.0 Repleting K and magnesium    Macrocytic anemia/B12 deficiency MCV 103.0;  B12< 150 low , folate 12.5-normal -B12 supplement IM x 1 today     Acute kidney injury on CKD 3A BUN/creatinine 36/1.51 (baseline creatinine 1.0-1.1).  Renally adjust medications, avoid nephrotoxic agents/dehydration/hypotension   Elevated troponin possibly secondary to type II demand ischemia Troponin 43 > 52 > 85 > 105, - she denies chest pain Continue to monitor troponin, EKG as needed  Hypokalemia -- oral replacement given  Hypomagnesemia -- repleted    Elevated BNP BNP 187  No respiratory distress, hemodynamically stable, no signs of fluid overload   Continue total input/output, daily weights and fluid restriction Echocardiogram done on 09/21/2023 showed LVEF of 65 to 70%.   No RWMA.  LV diastolic parameters indeterminate.    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  Continue ostomy bag care   GERD Continue famotidine    Compression fracture of L1 Continue Tylenol  as needed Patient states that she has an appointment for surgical repair soon  DVT prophylaxis: SCDs Code Status: Full  Family Communication:  Disposition:    Consultants:   Procedures:   Antimicrobials:    Subjective: Pt reports that she is not having any SOB or palpitations.   Objective: Vitals:   02/22/24 1340 02/22/24 1350 02/22/24 1600 02/22/24  1617  BP:   (!) 128/44   Pulse: 61 62 63   Resp: 16 17 (!) 21   Temp:    97.8 F (36.6 C)  TempSrc:    Oral  SpO2: 99% 98% 98%   Weight:       Height:        Intake/Output Summary (Last 24 hours) at 02/22/2024 1705 Last data filed at 02/22/2024 9077 Gross per 24 hour  Intake 568.1 ml  Output --  Net 568.1 ml   Filed Weights   02/20/24 1844 02/21/24 1321 02/22/24 0409  Weight: 74.8 kg 72.7 kg 76.2 kg   Examination:  General exam: Appears calm and comfortable  Respiratory system: Clear to auscultation. Respiratory effort normal. Cardiovascular system: normal S1 & S2 heard. No JVD, murmurs, rubs, gallops or clicks. No pedal edema. Gastrointestinal system: Abdomen is nondistended, soft and nontender. No organomegaly or masses felt. Normal bowel sounds heard. Central nervous system: Alert and oriented. No focal neurological deficits. Extremities: Symmetric 5 x 5 power. Skin: No rashes, lesions or ulcers. Psychiatry: Judgement and insight appear normal. Mood & affect appropriate.   Data Reviewed: I have personally reviewed following labs and imaging studies  CBC: Recent Labs  Lab 02/20/24 1904 02/21/24 0325  WBC 9.9 8.5  NEUTROABS 6.9  --   HGB 11.4* 10.8*  HCT 34.5* 32.4*  MCV 103.0* 103.2*  PLT 169 146*    Basic Metabolic Panel: Recent Labs  Lab 02/17/24 1112 02/20/24 1904 02/21/24 0325 02/22/24 0955  NA 135 135 137 134*  K 5.2* 3.4* 3.5 3.2*  CL 107 104 105 104  CO2 21* 18* 21* 21*  GLUCOSE 90 116* 104* 102*  BUN 43* 36* 33* 25*  CREATININE 1.73* 1.51* 1.34* 1.31*  CALCIUM 9.2 8.6* 8.5* 8.1*  MG  --   --  1.5* 2.1  PHOS  --   --  2.5  --     CBG: No results for input(s): GLUCAP in the last 168 hours.  Recent Results (from the past 240 hours)  Surgical pcr screen     Status: None   Collection Time: 02/17/24 11:12 AM   Specimen: Nasal Mucosa; Nasal Swab  Result Value Ref Range Status   MRSA, PCR NEGATIVE NEGATIVE Final   Staphylococcus aureus NEGATIVE NEGATIVE Final    Comment: (NOTE) The Xpert SA Assay (FDA approved for NASAL specimens in patients 28 years of age and older), is one  component of a comprehensive surveillance program. It is not intended to diagnose infection nor to guide or monitor treatment. Performed at [facility], [address], [place], [place] [address]   MRSA Next Gen by PCR, Nasal     Status: None   Collection Time: 02/21/24  1:16 PM   Specimen: Nasal Mucosa; Nasal Swab  Result Value Ref Range Status   MRSA by PCR Next Gen NOT DETECTED NOT DETECTED Final    Comment: (NOTE) The GeneXpert MRSA Assay (FDA approved for NASAL specimens only), is one component of a comprehensive MRSA colonization surveillance program. It is not intended to diagnose MRSA infection nor to guide or monitor treatment for MRSA infections. Test performance is not FDA approved in patients less than 6 years old. Performed at [facility], [address], [place], [place] [address]      Radiology Studies: DG Chest Portable 1 View Result Date: 02/20/2024 CLINICAL DATA:  Tachycardia, shortness of breath and fatigue. EXAM: PORTABLE CHEST 1 VIEW COMPARISON:  Chest x-ray 10/09/2023 FINDINGS:  The heart size and mediastinal contours are within normal limits. Both lungs are clear. The visualized skeletal structures are unremarkable. IMPRESSION: No active disease. Electronically Signed   By: Greig Pique M.D.   On: 02/20/2024 19:47    Scheduled Meds:  amiodarone   200 mg Oral BID   aspirin  EC  81 mg Oral Daily   Chlorhexidine  Gluconate Cloth  6 each Topical Q0600   famotidine   40 mg Oral Daily   feeding supplement  237 mL Oral BID BM   irbesartan   150 mg Oral Daily   levothyroxine   100 mcg Oral QAC breakfast   magnesium  oxide  400 mg Oral Daily   Continuous Infusions:   LOS: 0 days   Time spent: 55 mins  Nicholus Chandran Vicci, MD How to contact the [facility] Attending or Consulting provider 7A - 7P or covering provider during after hours 7P -7A, for this patient?  Check the care team in [facility] and look for a) attending/consulting TRH provider listed and b) the TRH team  listed Log into [email or web address] to find provider on call.  Locate the TRH provider you are looking for under [facility] and page to a number that you can be directly reached. If you still have difficulty reaching the provider, please page the [facility] (Director on Call) for the Hospitalists listed on amion for assistance.  02/22/2024, 5:05 PM

## 2024-02-22 NOTE — Care Management Obs Status (Signed)
 MEDICARE OBSERVATION STATUS NOTIFICATION   Patient Details  Name: Felicia Frank MRN: [id number] Date of Birth: 06-Jan-1946   Medicare Observation Status Notification Given:  Yes    Duwaine LITTIE Ada 02/22/2024, 8:53 AM

## 2024-02-22 NOTE — Progress Notes (Signed)
 Progress Note  Patient Name: Felicia Frank Date of Encounter: 02/22/2024  Primary Cardiologist: [name], MD [name]  Interval Summary  Chart reviewed.  She reports some palpitations and nausea overnight, but is in sinus rhythm by telemetry at this point on IV amiodarone .  Blood pressure trending up.  Vital Signs  Vitals:   02/22/24 0409 02/22/24 0500 02/22/24 0700 02/22/24 0740  BP:  (!) 129/46 (!) 151/51   Pulse: (!) 56 (!) 58 (!) 52   Resp: 16 (!) 21 17   Temp: 98 F (36.7 C)   97.7 F (36.5 C)  TempSrc: Oral   Oral  SpO2: 97% 97% 96%   Weight: 76.2 kg     Height:        Intake/Output Summary (Last 24 hours) at 02/22/2024 0832 Last data filed at 02/22/2024 0831 Gross per 24 hour  Intake 700.02 ml  Output --  Net 700.02 ml   Filed Weights   02/20/24 1844 02/21/24 1321 02/22/24 0409  Weight: 74.8 kg 72.7 kg 76.2 kg    Physical Exam  GEN: No acute distress.   Neck: No JVD. Cardiac: RRR, no murmur, rub, or gallop.  Respiratory: Nonlabored. Clear to auscultation bilaterally. GI: Soft, nontender, bowel sounds present. MS: No edema. Neuro:  Nonfocal. Psych: Alert and oriented x 3. Normal affect.  ECG/Telemetry  Telemetry reviewed showing sinus bradycardia.  Labs  Chemistry Recent Labs  Lab 02/17/24 1112 02/20/24 1904 02/21/24 0325  NA 135 135 137  K 5.2* 3.4* 3.5  CL 107 104 105  CO2 21* 18* 21*  GLUCOSE 90 116* 104*  BUN 43* 36* 33*  CREATININE 1.73* 1.51* 1.34*  CALCIUM 9.2 8.6* 8.5*  PROT  --  6.2* 5.7*  ALBUMIN   --  3.3* 2.9*  AST  --  22 20  ALT  --  39 34  ALKPHOS  --  75 68  BILITOT  --  0.6 0.8  GFRNONAA 30* 35* 41*  ANIONGAP 7 13 11     Hematology Recent Labs  Lab 02/20/24 1904 02/21/24 0325  WBC 9.9 8.5  RBC 3.35* 3.14*  HGB 11.4* 10.8*  HCT 34.5* 32.4*  MCV 103.0* 103.2*  MCH 34.0 34.4*  MCHC 33.0 33.3  RDW 13.9 13.7  PLT 169 146*   Cardiac Enzymes Recent Labs  Lab 02/20/24 2057 02/20/24 2300  02/21/24 0044 02/21/24 0325 02/21/24 0517  TROPONINIHS 52* 85* 105* 118* 93*   Lipid Panel     Component Value Date/Time   TRIG 751 (H) 10/24/2023 0312    Cardiac Studies  Echocardiogram 09/21/2023:  1. Left ventricular ejection fraction, by estimation, is 65 to 70%. The  left ventricle has normal function. The left ventricle has no regional  wall motion abnormalities. Left ventricular diastolic parameters are  indeterminate.   2. Right ventricular systolic function is normal. The right ventricular  size is normal. Tricuspid regurgitation signal is inadequate for assessing  PA pressure.   3. The mitral valve is normal in structure. No evidence of mitral valve  regurgitation. No evidence of mitral stenosis.   4. The aortic valve is tricuspid. Aortic valve regurgitation is not  visualized. No aortic stenosis is present.   Assessment & Plan  1.  Typical atrial flutter with RVR, largely 2:1 block.  Suspect paroxysmal to persistent at least since this past weekend.  She has a known history of paroxysmal atrial fibrillation with CHA2DS2-VASc score of 4.  She has converted to sinus rhythm on IV amiodarone .  She is not anticoagulated given prior history of hemorrhagic shock.  TSH and LFTs normal.   2.  Mildly elevated high-sensitivity troponin I levels, peak 118 consistent with demand ischemia in the setting of rapid heart rate.  She reports no chest pain.   3.  Primary hypertension.  Typical home regimen includes Coreg  25 mg twice daily, hydralazine 25 mg twice daily, Micardis 80 mg daily, and as needed clonidine 0.1 mg tablets.  States that she had not been taking any regular medications since Sunday due to lower blood pressures.   4.  CKD stage III, creatinine 1.34 with GFR 41.  She follows with nephrology.   5.  Hypothyroidism, on Synthroid  as an outpatient.  TSH normal at 2.5.  Plan to transition from IV amiodarone  to amiodarone  200 mg p.o. twice daily.  Hold off on resuming Coreg ,  will start Avapro  150 mg daily (formulary for Micardis which she takes at home).  Can transfer out of unit to telemetry.  May need further adjustments in antihypertensive regimen, but would anticipate potential discharge within the next 24 hours and maintain follow-up with her regular cardiologist.  For questions or updates, please contact [facility] Please consult [email or web address] for contact info under   Signed, Jayson Sierras, MD  02/22/2024, 8:32 AM

## 2024-02-22 NOTE — Plan of Care (Signed)
   Problem: Education: Goal: Knowledge of General Education information will improve Description: Including pain rating scale, medication(s)/side effects and non-pharmacologic comfort measures Outcome: Progressing   Problem: Safety: Goal: Ability to remain free from injury will improve Outcome: Progressing

## 2024-02-23 ENCOUNTER — Other Ambulatory Visit: Payer: Self-pay

## 2024-02-23 ENCOUNTER — Inpatient Hospital Stay (HOSPITAL_COMMUNITY)

## 2024-02-23 DIAGNOSIS — I10 Essential (primary) hypertension: Secondary | ICD-10-CM | POA: Diagnosis not present

## 2024-02-23 DIAGNOSIS — I4892 Unspecified atrial flutter: Secondary | ICD-10-CM | POA: Diagnosis not present

## 2024-02-23 DIAGNOSIS — E039 Hypothyroidism, unspecified: Secondary | ICD-10-CM | POA: Diagnosis not present

## 2024-02-23 DIAGNOSIS — R7989 Other specified abnormal findings of blood chemistry: Secondary | ICD-10-CM | POA: Diagnosis not present

## 2024-02-23 LAB — CBC
HCT: 32.1 % — ABNORMAL LOW (ref 36.0–46.0)
Hemoglobin: 10.5 g/dL — ABNORMAL LOW (ref 12.0–15.0)
MCH: 33.5 pg (ref 26.0–34.0)
MCHC: 32.7 g/dL (ref 30.0–36.0)
MCV: 102.6 fL — ABNORMAL HIGH (ref 80.0–100.0)
Platelets: 173 K/uL (ref 150–400)
RBC: 3.13 MIL/uL — ABNORMAL LOW (ref 3.87–5.11)
RDW: 13.9 % (ref 11.5–15.5)
WBC: 8.9 K/uL (ref 4.0–10.5)
nRBC: 0 % (ref 0.0–0.2)

## 2024-02-23 LAB — BASIC METABOLIC PANEL WITH GFR
Anion gap: 7 (ref 5–15)
BUN: 23 mg/dL (ref 8–23)
CO2: 20 mmol/L — ABNORMAL LOW (ref 22–32)
Calcium: 8.2 mg/dL — ABNORMAL LOW (ref 8.9–10.3)
Chloride: 108 mmol/L (ref 98–111)
Creatinine, Ser: 1.34 mg/dL — ABNORMAL HIGH (ref 0.44–1.00)
GFR, Estimated: 41 mL/min — ABNORMAL LOW (ref >60)
Glucose, Bld: 96 mg/dL (ref 70–99)
Potassium: 4 mmol/L (ref 3.5–5.1)
Sodium: 135 mmol/L (ref 135–145)

## 2024-02-23 LAB — MAGNESIUM: Magnesium: 1.9 mg/dL (ref 1.7–2.4)

## 2024-02-23 MED ORDER — AMIODARONE HCL IN DEXTROSE 360-4.14 MG/200ML-% IV SOLN
30.0000 mg/h | INTRAVENOUS | Status: AC
  Administered 2024-02-23 – 2024-02-26 (×7): 30 mg/h via INTRAVENOUS
  Filled 2024-02-23 (×7): qty 200

## 2024-02-23 MED ORDER — DILTIAZEM HCL 30 MG PO TABS
30.0000 mg | ORAL_TABLET | Freq: Four times a day (QID) | ORAL | Status: DC
  Administered 2024-02-23 – 2024-02-26 (×10): 30 mg via ORAL
  Filled 2024-02-23 (×10): qty 1

## 2024-02-23 MED ORDER — AMIODARONE HCL IN DEXTROSE 360-4.14 MG/200ML-% IV SOLN
60.0000 mg/h | INTRAVENOUS | Status: AC
  Administered 2024-02-23: 60 mg/h via INTRAVENOUS
  Filled 2024-02-23: qty 200

## 2024-02-23 MED ORDER — HYALURONIDASE HUMAN 150 UNIT/ML IJ SOLN
150.0000 [IU] | Freq: Once | INTRAMUSCULAR | Status: AC
  Administered 2024-02-23: 150 [IU] via SUBCUTANEOUS
  Filled 2024-02-23: qty 1

## 2024-02-23 MED ORDER — MAGNESIUM SULFATE 2 GM/50ML IV SOLN
2.0000 g | Freq: Once | INTRAVENOUS | Status: AC
  Administered 2024-02-23: 2 g via INTRAVENOUS
  Filled 2024-02-23: qty 50

## 2024-02-23 MED ORDER — AMIODARONE LOAD VIA INFUSION
150.0000 mg | Freq: Once | INTRAVENOUS | Status: AC
Start: 2024-02-23 — End: 2024-02-23
  Administered 2024-02-23: 150 mg via INTRAVENOUS
  Filled 2024-02-23: qty 83.34

## 2024-02-23 MED ORDER — HYALURONIDASE OVINE 200 UNIT/ML IJ SOLN
150.0000 [IU] | Freq: Once | INTRAMUSCULAR | Status: DC
  Filled 2024-02-23: qty 0.75

## 2024-02-23 MED ORDER — ZOLPIDEM TARTRATE 5 MG PO TABS
5.0000 mg | ORAL_TABLET | Freq: Once | ORAL | Status: AC
  Administered 2024-02-23: 5 mg via ORAL
  Filled 2024-02-23: qty 1

## 2024-02-23 MED ORDER — VITAMIN B-12 1000 MCG PO TABS
1000.0000 ug | ORAL_TABLET | Freq: Every day | ORAL | Status: DC
  Administered 2024-02-23 – 2024-03-02 (×9): 1000 ug via ORAL
  Filled 2024-02-23 (×9): qty 1

## 2024-02-23 MED ORDER — AMIODARONE IV BOLUS ONLY 150 MG/100ML
150.0000 mg | Freq: Once | INTRAVENOUS | Status: AC
  Administered 2024-02-23: 150 mg via INTRAVENOUS
  Filled 2024-02-23: qty 100

## 2024-02-23 MED ORDER — ENSURE MAX PROTEIN PO LIQD
11.0000 [oz_av] | Freq: Every day | ORAL | Status: DC
  Administered 2024-02-24 – 2024-02-26 (×2): 11 [oz_av] via ORAL

## 2024-02-23 MED ORDER — ADULT MULTIVITAMIN W/MINERALS CH
1.0000 | ORAL_TABLET | Freq: Every day | ORAL | Status: DC
  Administered 2024-02-23 – 2024-03-02 (×9): 1 via ORAL
  Filled 2024-02-23 (×9): qty 1

## 2024-02-23 NOTE — Progress Notes (Addendum)
 PROGRESS NOTE   Felicia Frank  FMW:[id number] DOB: September 09, 1945 DOA: 02/20/2024 PCP: Dino Devere RAMAN, MD   Chief Complaint  Patient presents with   Tachycardia   Level of care: Stepdown  Brief Admission History:  Felicia Frank is a 78 y.o. female with medical history significant of HTN, hypothyroidism, Atrial fibrilation, CKD 3A who presents to the emergency department due to sensation of chest tightness and bilateral arm heaviness which started yesterday in the morning, this was associated with low energy.  She checked her heart rate at home and it ranged within 145 to 160 bpm.  This improved on waking up this morning to 80, but this quickly increased again.  Apparently, patient has not been taking her beta-blocker due to low blood pressure. Patient was not on any anticoagulant at this time due to GI bleed and anemia that was stopped when she was initially placed on Eliquis .   ED Course:  Patient was tachypneic, tachycardic, but other vital signs were within normal range.  Workup in the ED showed macrocytic anemia, BMP shows sodium 135, potassium 3.4, chloride 104, bicarb 18, blood glucose 116, BUN/creatinine 36/1.51 (baseline creatinine 1.0-1.1).  Troponin 43 > 52.  BNP 187. Chest x-ray showed no active disease IV Cardizem  10 mg x 1 was given, patient was started on Cardizem  drip.  TRH was asked to admit patient  Assessment and Plan:  Paroxysmal atrial flutter with RVR Patient is being treated with IV amiodarone  infusion She stopped taking her beta-blocker due to low blood pressures She is not on anticoagulant due to history of GI bleed -Cardiology consulted, appreciate further evaluation recommendations -pt restarted on IV amiodarone  overnight and bolused this morning by cardiology  -avapro  150 mg daily started by cardiology -holding home carvedilol  for now    Hypokalemia/hypomagnesia Maintaining serum K > 4.0, magnesium >2.0 Repleting K and magnesium    Macrocytic  anemia/B12 deficiency MCV 103.0; B12< 150 low , folate 12.5-normal -B12 supplement IM x 1 given and daily oral B12 1000 mcg ordered     Acute kidney injury on CKD 3A BUN/creatinine 36/1.51 (baseline creatinine 1.0-1.1).  Renally adjust medications, avoid nephrotoxic agents/dehydration/hypotension   Elevated troponin possibly secondary to type II demand ischemia Troponin 43 > 52 > 85 > 105, - she denies chest pain Continue to monitor troponin, EKG as needed  Hypokalemia -- oral replacement given and repleted   Hypomagnesemia -- repleted    Elevated BNP BNP 187  No respiratory distress, hemodynamically stable, no signs of fluid overload   Continue total input/output, daily weights and fluid restriction Echocardiogram done on 09/21/2023 showed LVEF of 65 to 70%.   No RWMA.  LV diastolic parameters indeterminate.  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  Continue ostomy bag care   GERD Continue famotidine    Compression fracture of L1 Continue Tylenol  as needed Patient states that she has an appointment for surgical repair soon  DVT prophylaxis: SCDs Code Status: Full  Family Communication:  Disposition:    Consultants:   Procedures:   Antimicrobials:    Subjective: Pt had another bout of spontaneous symptomatic Afib RVR overnight, restarted on IV amiodarone  infusion  Objective: Vitals:   02/23/24 0800 02/23/24 0900 02/23/24 1220 02/23/24 1300  BP: 124/63 134/70 138/67 135/70  Pulse: (!) 140 (!)  144    Resp: 18 20 18  (!) 27  Temp:      TempSrc:      SpO2: 98% 99%    Weight:      Height:        Intake/Output Summary (Last 24 hours) at 02/23/2024 1440 Last data filed at 02/23/2024 1251 Gross per 24 hour  Intake 598.04 ml  Output 500 ml  Net 98.04 ml   Filed Weights   02/21/24 1321 02/22/24 0409 02/23/24 0559  Weight: 72.7 kg 76.2 kg 73 kg   Examination:  General exam: Appears calm and comfortable  Respiratory system: Clear to auscultation. Respiratory effort normal. Cardiovascular system: normal S1 & S2 heard. No JVD, murmurs, rubs, gallops or clicks. No pedal edema. Gastrointestinal system: Abdomen is nondistended, soft and nontender. No organomegaly or masses felt. Normal bowel sounds heard. Central nervous system: Alert and oriented. No focal neurological deficits. Extremities: Symmetric 5 x 5 power. Skin: No rashes, lesions or ulcers. Psychiatry: Judgement and insight appear normal. Mood & affect appropriate.   Data Reviewed: I have personally reviewed following labs and imaging studies  CBC: Recent Labs  Lab 02/20/24 1904 02/21/24 0325 02/23/24 0310  WBC 9.9 8.5 8.9  NEUTROABS 6.9  --   --   HGB 11.4* 10.8* 10.5*  HCT 34.5* 32.4* 32.1*  MCV 103.0* 103.2* 102.6*  PLT 169 146* 173    Basic Metabolic Panel: Recent Labs  Lab 02/17/24 1112 02/20/24 1904 02/21/24 0325 02/22/24 0955 02/23/24 0310  NA 135 135 137 134* 135  K 5.2* 3.4* 3.5 3.2* 4.0  CL 107 104 105 104 108  CO2 21* 18* 21* 21* 20*  GLUCOSE 90 116* 104* 102* 96  BUN 43* 36* 33* 25* 23  CREATININE 1.73* 1.51* 1.34* 1.31* 1.34*  CALCIUM 9.2 8.6* 8.5* 8.1* 8.2*  MG  --   --  1.5* 2.1 1.9  PHOS  --   --  2.5  --   --     CBG: No results for input(s): GLUCAP in the last 168 hours.  Recent Results (from the past 240 hours)  Surgical pcr screen     Status: None   Collection Time: 02/17/24 11:12 AM   Specimen: Nasal Mucosa; Nasal Swab  Result Value Ref Range Status   MRSA, PCR  NEGATIVE NEGATIVE Final   Staphylococcus aureus NEGATIVE NEGATIVE Final    Comment: (NOTE) The Xpert SA Assay (FDA approved for NASAL specimens in patients 67 years of age and older), is one component of a comprehensive surveillance program. It is not intended to diagnose infection nor to guide or monitor treatment. Performed at [facility], [address] [place], [place] [address]   MRSA Next Gen by PCR, Nasal     Status: None   Collection Time: 02/21/24  1:16 PM   Specimen: Nasal Mucosa; Nasal Swab  Result Value Ref Range Status   MRSA by PCR Next Gen NOT DETECTED NOT DETECTED Final    Comment: (NOTE) The GeneXpert MRSA Assay (FDA approved for NASAL specimens only), is one component of a comprehensive MRSA colonization surveillance program. It is not intended to diagnose MRSA infection nor to guide or monitor treatment for MRSA infections. Test performance is not FDA approved in patients less than 22 years old. Performed at [facility], [address] [place], [place] [address]  Radiology Studies: DG CHEST PORT 1 VIEW Result Date: 02/23/2024 CLINICAL DATA:  Status post central line placement EXAM: PORTABLE CHEST 1 VIEW COMPARISON:  Chest radiograph dated 02/20/2024 FINDINGS: Lines/tubes: Right internal jugular venous catheter tip projects over the superior cavoatrial junction. Lungs: Low lung volumes with bronchovascular crowding. Focal opacity projecting over the left costophrenic angle. Pleura: Blunting of the left costophrenic angle.  No pneumothorax. Heart/mediastinum: Similar enlarged cardiomediastinal silhouette. Bones: No acute osseous abnormality. Partially imaged gas-filled mildly dilated upper abdominal bowel loops. IMPRESSION: 1. Right internal jugular venous catheter tip projects over the superior cavoatrial junction. No pneumothorax. 2. Trace left pleural effusion. Opacity projecting over the left costophrenic angle may represent atelectasis. Electronically  Signed   By: Limin  Xu M.D.   On: 02/23/2024 14:21   US  EKG SITE RITE Result Date: 02/23/2024 If Site Rite image not attached, placement could not be confirmed due to current cardiac rhythm.   Scheduled Meds:  aspirin  EC  81 mg Oral Daily   Chlorhexidine  Gluconate Cloth  6 each Topical Q0600   famotidine   40 mg Oral Daily   irbesartan   150 mg Oral Daily   levothyroxine   100 mcg Oral QAC breakfast   magnesium  oxide  400 mg Oral Daily   multivitamin with minerals  1 tablet Oral Daily   Ensure Max Protein  11 oz Oral Daily   Continuous Infusions:  amiodarone  30 mg/hr (02/23/24 0906)    LOS: 1 day   Critical Care Procedure Note Authorized and Performed by: KYM Louder MD  Total Critical Care time:  56 mins Due to a high probability of clinically significant, life threatening deterioration, the patient required my highest level of preparedness to intervene emergently and I personally spent this critical care time directly and personally managing the patient.  This critical care time included obtaining a history; examining the patient, pulse oximetry; ordering and review of studies; arranging urgent treatment with development of a management plan; evaluation of patient's response of treatment; frequent reassessment; and discussions with other providers.  This critical care time was performed to assess and manage the high probability of imminent and life threatening deterioration that could result in multi-organ failure.  It was exclusive of separately billable procedures and treating other patients and teaching time.    Afton Louder, MD How to contact the TRH Attending or Consulting provider 7A - 7P or covering provider during after hours 7P -7A, for this patient?  Check the care team in [facility] and look for a) attending/consulting TRH provider listed and b) the TRH team listed Log into [email or web address] to find provider on call.  Locate the [facility] provider you are looking for under [facility] and page to a number that you can be directly reached. If you still have difficulty reaching the provider, please page the [facility] (Director on Call) for the Hospitalists listed on amion for assistance.  02/23/2024, 2:40 PM

## 2024-02-23 NOTE — Progress Notes (Signed)
   02/23/24 0156  Provider Notification  Provider Name/Title Erminio Cone NP  Date Provider Notified 02/23/24  Time Provider Notified [phone]  Method of Notification  (message)  Notification Reason Change in status (HR sustaining 140s)  Provider response See new orders (restart amiodarone  gtt without the bolus.)  Date of Provider Response 02/23/24  Time of Provider Response 0200

## 2024-02-23 NOTE — Progress Notes (Signed)
 Rounding Note   Patient Name: Felicia Frank Date of Encounter: 02/23/2024  [facility] Cardiologist: [name], MD   Subjective Recurrent palpitations that started yesterday  Scheduled Meds:  amiodarone   200 mg Oral BID   aspirin  EC  81 mg Oral Daily   Chlorhexidine  Gluconate Cloth  6 each Topical Q0600   famotidine   40 mg Oral Daily   feeding supplement  237 mL Oral BID BM   irbesartan   150 mg Oral Daily   levothyroxine   100 mcg Oral QAC breakfast   magnesium  oxide  400 mg Oral Daily   Continuous Infusions:  amiodarone  60 mg/hr (02/23/24 0600)   amiodarone      PRN Meds: acetaminophen  **OR** acetaminophen , bumetanide , HYDROmorphone  (DILAUDID ) injection, labetalol , ondansetron  **OR** ondansetron  (ZOFRAN ) IV, prochlorperazine    Vital Signs  Vitals:   02/23/24 0559 02/23/24 0600 02/23/24 0700 02/23/24 0739  BP:  (!) 162/73 (!) 155/75   Pulse:  (!) 143 (!) 142   Resp:  (!) 34 (!) 28   Temp:    98.8 F (37.1 C)  TempSrc:    Oral  SpO2:  98% 98%   Weight: 73 kg     Height:        Intake/Output Summary (Last 24 hours) at 02/23/2024 0820 Last data filed at 02/23/2024 0739 Gross per 24 hour  Intake 618.33 ml  Output --  Net 618.33 ml      02/23/2024    5:59 AM 02/22/2024    4:09 AM 02/21/2024    1:21 PM  Last 3 Weights  Weight (lbs) 160 lb 15 oz 167 lb 15.9 oz 160 lb 4.8 oz  Weight (kg) 73 kg 76.2 kg 72.712 kg      Telemetry Aflutter with RVR - Personally Reviewed  ECG  N/a - Personally Reviewed  Physical Exam  GEN: No acute distress.   Neck: No JVD Cardiac: tachy, regular Respiratory: Clear to auscultation bilaterally. GI: Soft, nontender, non-distended  MS: No edema; No deformity. Neuro:  Nonfocal  Psych: Normal affect   Labs High Sensitivity Troponin:   Recent Labs  Lab 02/20/24 2057 02/20/24 2300 02/21/24 0044 02/21/24 0325 02/21/24 0517  TROPONINIHS 52* 85* 105* 118* 93*     Chemistry Recent Labs  Lab 02/20/24 1904  02/21/24 0325 02/22/24 0955 02/23/24 0310  NA 135 137 134* 135  K 3.4* 3.5 3.2* 4.0  CL 104 105 104 108  CO2 18* 21* 21* 20*  GLUCOSE 116* 104* 102* 96  BUN 36* 33* 25* 23  CREATININE 1.51* 1.34* 1.31* 1.34*  CALCIUM 8.6* 8.5* 8.1* 8.2*  MG  --  1.5* 2.1 1.9  PROT 6.2* 5.7*  --   --   ALBUMIN  3.3* 2.9*  --   --   AST 22 20  --   --   ALT 39 34  --   --   ALKPHOS 75 68  --   --   BILITOT 0.6 0.8  --   --   GFRNONAA 35* 41* 42* 41*  ANIONGAP 13 11 9 7     Lipids No results for input(s): CHOL, TRIG, HDL, LABVLDL, LDLCALC, CHOLHDL in the last 168 hours.  Hematology Recent Labs  Lab 02/20/24 1904 02/21/24 0325 02/23/24 0310  WBC 9.9 8.5 8.9  RBC 3.35* 3.14* 3.13*  HGB 11.4* 10.8* 10.5*  HCT 34.5* 32.4* 32.1*  MCV 103.0* 103.2* 102.6*  MCH 34.0 34.4* 33.5  MCHC 33.0 33.3 32.7  RDW 13.9 13.7 13.9  PLT 169 146* 173  Thyroid  Recent Labs  Lab 02/21/24 0517  TSH 2.503    BNP Recent Labs  Lab 02/20/24 1904  BNP 187.0*    DDimer No results for input(s): DDIMER in the last 168 hours.   Radiology  No results found.     Assessment & Plan   1.Aflutter - prior history of PAF, issues with aflutter this admission - started on IV amiodarone  - from notes has not been started on anticoagulation due to prior history of hemorrhagic shock/GI bleed - transitioned to oral amio 200mg  bid, plan would be for 3 weeks then 200mg  daily  - recurrent aflutter with RVR overnight, back on IV amio gtt, was not rebolused - bolus amio 150mg  and then continue drip   2.HTN - labile bp's, monitor on current regimen.    For questions or updates, please contact [facility] Please consult [email or web address] for contact info under       Signed, Alvan Carrier, MD  02/23/2024, 8:21 AM

## 2024-02-23 NOTE — Progress Notes (Addendum)
 Initial Nutrition Assessment  DOCUMENTATION CODES:   Not applicable  INTERVENTION:   Ensure Max Protein po daily, each supplement provides 150 kcal and 30 grams of protein.  Magic cup TID with meals, each supplement provides 290 kcal and 9 grams of protein. MVI with minerals daily.  NUTRITION DIAGNOSIS:   Inadequate oral intake related to decreased appetite as evidenced by per patient/family report.  GOAL:   Patient will meet greater than or equal to 90% of their needs  MONITOR:   PO intake, Supplement acceptance  REASON FOR ASSESSMENT:   Malnutrition Screening Tool    ASSESSMENT:   78 yo female admitted with paroxysmal atrial flutter. 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  Patient reports that she has lost a lot of weight since her surgery (partial colectomy with colostomy creation, partial small bowel resection, & ileocecectomy) earlier this year in May. She had a lot of edema during that hospitalization. She usually weighs ~190 lbs and is currently down to 160 lbs or less. Weight history reviewed. She has had 17% weight loss within the past 6 months, some of which is likely r/t fluid losses. Her appetite and intake has been so-so. She does not like the Ensure supplements; usually drinks Premier Protein at home. Agreed to try Ensure Max Protein, chocolate flavor. She also agreed to Magic cups with meals and a MVI.  Labs reviewed. Medications reviewed and include pepcid , mag-ox.  NUTRITION - FOCUSED PHYSICAL EXAM:  Flowsheet Row Most Recent Value  Orbital Region Mild depletion  Upper Arm Region No depletion  Thoracic and Lumbar Region No depletion  Buccal Region No depletion  Temple Region Mild depletion  Clavicle Bone Region No depletion  Clavicle and Acromion Bone Region No depletion  Scapular Bone Region No depletion  Dorsal Hand Mild depletion  Patellar Region  Unable to assess  Anterior Thigh Region Unable to assess  Posterior Calf Region No depletion  Edema (RD Assessment) None  Hair Reviewed  Eyes Reviewed  Mouth Reviewed  Skin Reviewed  Nails Reviewed    Diet Order:   Diet Order             Diet Heart Room service appropriate? Yes; Fluid consistency: Thin  Diet effective now                   EDUCATION NEEDS:   No education needs have been identified at this time  Skin:  Skin Assessment: Reviewed RN Assessment  Last BM:  9/25 colostomy  Height:   Ht Readings from Last 1 Encounters:  02/21/24 5' 3.5 (1.613 m)    Weight:   Wt Readings from Last 1 Encounters:  02/23/24 73 kg    Ideal Body Weight:  53.4 kg  BMI:  Body mass index is 28.06 kg/m.  Estimated Nutritional Needs:   Kcal:  1650-1850  Protein:  85-95 gm  Fluid:  1.7-1.9 L   Suzen HUNT RD, LDN, CNSC Contact via secure chat. If unavailable, use group chat RD Inpatient.

## 2024-02-23 NOTE — Significant Event (Signed)
       CROSS COVER NOTE  NAME: Felicia Frank MRN: [id number] DOB : 1945-11-20 ATTENDING PHYSICIAN: Vicci Afton CROME, MD    Date of Service   02/23/2024   HPI/Events of Note   Remote cross cover for [facility] Nurse reports return afib with RVR 140s despite oral amiodarone  given at 2200. Weaned off IV amiodarone  yesterday. Nurse reports patient symptomatic with palpitations  HPI/notes/labs/diagnostics reviewed - per cardiology note, did not resume home beta blocker therapy  Interventions   Assessment/Plan: .......    02/23/2024    1:56 AM 02/23/2024    1:00 AM 02/23/2024   12:00 AM  Vitals with BMI  Systolic  142 150  Diastolic  67 65  Pulse 139 133 131    Resume amiodarone  infusion without bolus       Erminio CROME [facility] NP [facility] Cross Cover 7pm-7am - check amion for availability Pager [phone]

## 2024-02-23 NOTE — Progress Notes (Signed)
 Patient IV to right forearm infiltrated. Amiodarone  going at 60ml at the time. Cite is red, edematous, not painful. Pharmacy was made aware. MD made aware. Will give hyaluronidase . IV removed. Extremity elevated with warm compresses; border marked. Patient has no complaints at this time.

## 2024-02-23 NOTE — Plan of Care (Signed)
  Problem: Education: Goal: Knowledge of General Education information will improve Description: Including pain rating scale, medication(s)/side effects and non-pharmacologic comfort measures Outcome: Progressing   Problem: Health Behavior/Discharge Planning: Goal: Ability to manage health-related needs will improve Outcome: Progressing   Problem: Clinical Measurements: Goal: Ability to maintain clinical measurements within normal limits will improve Outcome: Not Progressing Goal: Will remain free from infection Outcome: Progressing Goal: Cardiovascular complication will be avoided Outcome: Not Progressing   Problem: Activity: Goal: Risk for activity intolerance will decrease Outcome: Progressing

## 2024-02-24 DIAGNOSIS — R7989 Other specified abnormal findings of blood chemistry: Secondary | ICD-10-CM | POA: Diagnosis not present

## 2024-02-24 DIAGNOSIS — S32010D Wedge compression fracture of first lumbar vertebra, subsequent encounter for fracture with routine healing: Secondary | ICD-10-CM

## 2024-02-24 DIAGNOSIS — E876 Hypokalemia: Secondary | ICD-10-CM | POA: Diagnosis not present

## 2024-02-24 DIAGNOSIS — I4892 Unspecified atrial flutter: Secondary | ICD-10-CM | POA: Diagnosis not present

## 2024-02-24 DIAGNOSIS — I10 Essential (primary) hypertension: Secondary | ICD-10-CM | POA: Diagnosis not present

## 2024-02-24 LAB — MAGNESIUM: Magnesium: 2.1 mg/dL (ref 1.7–2.4)

## 2024-02-24 LAB — CBC
HCT: 34.1 % — ABNORMAL LOW (ref 36.0–46.0)
Hemoglobin: 11.3 g/dL — ABNORMAL LOW (ref 12.0–15.0)
MCH: 34.1 pg — ABNORMAL HIGH (ref 26.0–34.0)
MCHC: 33.1 g/dL (ref 30.0–36.0)
MCV: 103 fL — ABNORMAL HIGH (ref 80.0–100.0)
Platelets: 186 K/uL (ref 150–400)
RBC: 3.31 MIL/uL — ABNORMAL LOW (ref 3.87–5.11)
RDW: 13.7 % (ref 11.5–15.5)
WBC: 11.2 K/uL — ABNORMAL HIGH (ref 4.0–10.5)
nRBC: 0 % (ref 0.0–0.2)

## 2024-02-24 LAB — BASIC METABOLIC PANEL WITH GFR
Anion gap: 8 (ref 5–15)
BUN: 21 mg/dL (ref 8–23)
CO2: 22 mmol/L (ref 22–32)
Calcium: 8.3 mg/dL — ABNORMAL LOW (ref 8.9–10.3)
Chloride: 105 mmol/L (ref 98–111)
Creatinine, Ser: 1.29 mg/dL — ABNORMAL HIGH (ref 0.44–1.00)
GFR, Estimated: 42 mL/min — ABNORMAL LOW (ref >60)
Glucose, Bld: 98 mg/dL (ref 70–99)
Potassium: 4.4 mmol/L (ref 3.5–5.1)
Sodium: 135 mmol/L (ref 135–145)

## 2024-02-24 LAB — HEPARIN LEVEL (UNFRACTIONATED): Heparin Unfractionated: 0.79 [IU]/mL — ABNORMAL HIGH (ref 0.30–0.70)

## 2024-02-24 MED ORDER — AMIODARONE HCL 200 MG PO TABS: 200.0000 mg | ORAL_TABLET | Freq: Two times a day (BID) | ORAL | Status: DC

## 2024-02-24 MED ORDER — HEPARIN (PORCINE) 25000 UT/250ML-% IV SOLN
750.0000 [IU]/h | INTRAVENOUS | Status: AC
  Administered 2024-02-24: 900 [IU]/h via INTRAVENOUS
  Administered 2024-02-25 – 2024-02-27 (×2): 700 [IU]/h via INTRAVENOUS
  Administered 2024-02-28: 750 [IU]/h via INTRAVENOUS
  Filled 2024-02-24 (×4): qty 250

## 2024-02-24 MED ORDER — AMIODARONE LOAD VIA INFUSION
150.0000 mg | Freq: Once | INTRAVENOUS | Status: AC
  Administered 2024-02-24: 150 mg via INTRAVENOUS
  Filled 2024-02-24: qty 83.34

## 2024-02-24 MED ORDER — PANTOPRAZOLE SODIUM 40 MG PO TBEC
40.0000 mg | DELAYED_RELEASE_TABLET | Freq: Two times a day (BID) | ORAL | Status: DC
  Administered 2024-02-24 – 2024-03-02 (×15): 40 mg via ORAL
  Filled 2024-02-24 (×16): qty 1

## 2024-02-24 MED ORDER — ZOLPIDEM TARTRATE 5 MG PO TABS
5.0000 mg | ORAL_TABLET | Freq: Every evening | ORAL | Status: DC | PRN
  Administered 2024-02-24 – 2024-03-01 (×7): 5 mg via ORAL
  Filled 2024-02-24 (×7): qty 1

## 2024-02-24 MED ORDER — HEPARIN BOLUS VIA INFUSION
3000.0000 [IU] | Freq: Once | INTRAVENOUS | Status: AC
  Administered 2024-02-24: 3000 [IU] via INTRAVENOUS
  Filled 2024-02-24: qty 3000

## 2024-02-24 NOTE — Progress Notes (Addendum)
 Rounding Note   Patient Name: Felicia Frank Date of Encounter: 02/24/2024  [facility] Cardiologist: [name], MD   Subjective No complaints  Scheduled Meds:  aspirin  EC  81 mg Oral Daily   Chlorhexidine  Gluconate Cloth  6 each Topical Q0600   vitamin B-12  1,000 mcg Oral Daily   diltiazem   30 mg Oral Q6H   famotidine   40 mg Oral Daily   irbesartan   150 mg Oral Daily   levothyroxine   100 mcg Oral QAC breakfast   magnesium  oxide  400 mg Oral Daily   multivitamin with minerals  1 tablet Oral Daily   Ensure Max Protein  11 oz Oral Daily   Continuous Infusions:  amiodarone  30 mg/hr (02/24/24 0530)   PRN Meds: acetaminophen  **OR** acetaminophen , HYDROmorphone  (DILAUDID ) injection, labetalol , ondansetron  **OR** ondansetron  (ZOFRAN ) IV, prochlorperazine    Vital Signs  Vitals:   02/24/24 0639 02/24/24 0700 02/24/24 0701 02/24/24 0800  BP: 118/79 (!) 93/59 (!) 93/59 112/68  Pulse: (!) 147 (!) 153 (!) 153 (!) 154  Resp: 13 14 20 16   Temp:   98.2 F (36.8 C)   TempSrc:   Axillary   SpO2: 96% 96% 97% 97%  Weight:   75.4 kg   Height:        Intake/Output Summary (Last 24 hours) at 02/24/2024 0832 Last data filed at 02/24/2024 0400 Gross per 24 hour  Intake 855.6 ml  Output 500 ml  Net 355.6 ml      02/24/2024    7:01 AM 02/23/2024    5:59 AM 02/22/2024    4:09 AM  Last 3 Weights  Weight (lbs) 166 lb 3.6 oz 160 lb 15 oz 167 lb 15.9 oz  Weight (kg) 75.4 kg 73 kg 76.2 kg      Telemetry Aflutter with RVR - Personally Reviewed  ECG  N/a - Personally Reviewed  Physical Exam  GEN: No acute distress.   Neck: No JVD Cardiac: regular, tachycardic Respiratory: Clear to auscultation bilaterally. GI: Soft, nontender, non-distended  MS: No edema; No deformity. Neuro:  Nonfocal  Psych: Normal affect   Labs High Sensitivity Troponin:   Recent Labs  Lab 02/20/24 2057 02/20/24 2300 02/21/24 0044 02/21/24 0325 02/21/24 0517  TROPONINIHS 52* 85*  105* 118* 93*     Chemistry Recent Labs  Lab 02/20/24 1904 02/20/24 1904 02/21/24 0325 02/22/24 0955 02/23/24 0310 02/24/24 0400  NA 135  --  137 134* 135 135  K 3.4*  --  3.5 3.2* 4.0 4.4  CL 104  --  105 104 108 105  CO2 18*  --  21* 21* 20* 22  GLUCOSE 116*  --  104* 102* 96 98  BUN 36*  --  33* 25* 23 21  CREATININE 1.51*  --  1.34* 1.31* 1.34* 1.29*  CALCIUM 8.6*  --  8.5* 8.1* 8.2* 8.3*  MG  --    < > 1.5* 2.1 1.9 2.1  PROT 6.2*  --  5.7*  --   --   --   ALBUMIN  3.3*  --  2.9*  --   --   --   AST 22  --  20  --   --   --   ALT 39  --  34  --   --   --   ALKPHOS 75  --  68  --   --   --   BILITOT 0.6  --  0.8  --   --   --  GFRNONAA 35*  --  41* 42* 41* 42*  ANIONGAP 13  --  11 9 7 8    < > = values in this interval not displayed.    Lipids No results for input(s): CHOL, TRIG, HDL, LABVLDL, LDLCALC, CHOLHDL in the last 168 hours.  Hematology Recent Labs  Lab 02/21/24 0325 02/23/24 0310 02/24/24 0400  WBC 8.5 8.9 11.2*  RBC 3.14* 3.13* 3.31*  HGB 10.8* 10.5* 11.3*  HCT 32.4* 32.1* 34.1*  MCV 103.2* 102.6* 103.0*  MCH 34.4* 33.5 34.1*  MCHC 33.3 32.7 33.1  RDW 13.7 13.9 13.7  PLT 146* 173 186   Thyroid  Recent Labs  Lab 02/21/24 0517  TSH 2.503    BNP Recent Labs  Lab 02/20/24 1904  BNP 187.0*    DDimer No results for input(s): DDIMER in the last 168 hours.   Radiology  DG CHEST PORT 1 VIEW Result Date: 02/23/2024 CLINICAL DATA:  Status post central line placement EXAM: PORTABLE CHEST 1 VIEW COMPARISON:  Chest radiograph dated 02/20/2024 FINDINGS: Lines/tubes: Right internal jugular venous catheter tip projects over the superior cavoatrial junction. Lungs: Low lung volumes with bronchovascular crowding. Focal opacity projecting over the left costophrenic angle. Pleura: Blunting of the left costophrenic angle.  No pneumothorax. Heart/mediastinum: Similar enlarged cardiomediastinal silhouette. Bones: No acute osseous abnormality. Partially  imaged gas-filled mildly dilated upper abdominal bowel loops. IMPRESSION: 1. Right internal jugular venous catheter tip projects over the superior cavoatrial junction. No pneumothorax. 2. Trace left pleural effusion. Opacity projecting over the left costophrenic angle may represent atelectasis. Electronically Signed   By: Limin  Xu M.D.   On: 02/23/2024 14:21   US  EKG SITE RITE Result Date: 02/23/2024 If Site Rite image not attached, placement could not be confirmed due to current cardiac rhythm.    Assessment & Plan   1.Aflutter - prior history of PAF, issues with aflutter this admission - started on IV amiodarone  - from notes has not been started on anticoagulation due to prior history of hemorrhagic shock/GI bleed - transitioned to oral amio 200mg  bid but recurrent aflutter, amio drip was restarted.    - recurrent aflutter with RVR overnight, back on IV amio gtt - remains in aflutter with high rates, rebolus amio 150mg  and continue drip - has been paroxysmal this admission and also not currently on anticoag, not a cardioversion candidate currently - soft bp's limit av nodal agent dosing, she is on dilt 30mg  every 6 hrs - will ask primary team to touch base with surgery and GI if she is not a candidate for anticoagulation.     Addendum: Spoke with Dr Vicci who has been in touch with GI and surgical teams that managed her GI bleeding in 09/2023. Teams agree ok to start anticoagulation. Will start heparin  initially just in case bleeding issue develops, if stable blood counts can transition to DOAC closder to discharge.    2.HTN - labile bp's, monitor on current regimen.   3. 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    For questions or updates, please contact [facility] Please consult [email or web address] for contact info under       Signed, Alvan Carrier, MD  02/24/2024, 8:32 AM

## 2024-02-24 NOTE — Progress Notes (Signed)
 [facility]  Patient admitted with A flutter with RVR. Has not been on Eliquis  since May when she had a colostomy for perforated diverticulitis and hemorrhagic shock post op. H&H Stabilized that hospitalization but she was never put back on the Eliquis . It does not look like she ever followed up with Dr. Mavis post op. I see no labs after her discharge, so she does not have record of the PCP follow up and CBC that was recommended in the DC.  H&H stable here, no signs of bleeding. She has no surgical contraindication for eliquis  at this time.  Can follow up with Dr. Mavis PRN.   Manuelita Pander, MD [facility] [address] [place], [place] [address] [phone] (office)

## 2024-02-24 NOTE — Progress Notes (Signed)
 PHARMACY - ANTICOAGULATION CONSULT NOTE  Pharmacy Consult for heparin  Indication: atrial fibrillation  Allergies  Allergen Reactions   Amlodipine Swelling   Clonidine Rash    Rash with patch only.  Okay to take pill    Patient Measurements: Height: 5' 3.5 (161.3 cm) Weight: 75.4 kg (166 lb 3.6 oz) IBW/kg (Calculated) : 53.55 HEPARIN  DW (KG): 68.7  Vital Signs: Temp: 98.4 F (36.9 C) (09/26 2016) Temp Source: Oral (09/26 2016) BP: 114/47 (09/26 2000) Pulse Rate: 80 (09/26 2000)  Labs: Recent Labs    02/22/24 0955 02/23/24 0310 02/24/24 0400 02/24/24 2000  HGB  --  10.5* 11.3*  --   HCT  --  32.1* 34.1*  --   PLT  --  173 186  --   HEPARINUNFRC  --   --   --  0.79*  CREATININE 1.31* 1.34* 1.29*  --     Estimated Creatinine Clearance: 35.3 mL/min (A) (by C-G formula based on SCr of 1.29 mg/dL (H)).   Medical History: Past Medical History:  Diagnosis Date   Anemia    Atrial fibrillation (HCC)    Chronic back pain    Chronic kidney disease    Diverticulosis    History of fall    HTN (hypertension)    Hypothyroidism    Osteoporosis     Assessment: 78 year old female with history of afib. Taken off anticoagulation in May and then may have been lost to follow up and never restarted on anticoagulation. New orders to start heparin  with plans to resume DOAC closer to discharge. Hemoglobin stable in 11s.   Goal of Therapy:  Heparin  level 0.3-0.7 units/ml Monitor platelets by anticoagulation protocol: Yes   Plan:  9/26 20:00 HL = 0.79, SUPRAtherapeutic  - Decrease Heparin  infusion to 800 units/hr - Check HL in 8 hours - Daily CBC while on Heparin  infusion  Olam Fritter, PharmD, BCPS 02/24/2024 9:11 PM

## 2024-02-24 NOTE — Progress Notes (Addendum)
 PROGRESS NOTE   Felicia Frank  FMW:[id number] DOB: [birth year] DOA: 02/20/2024 PCP: Dino Devere RAMAN, MD   Chief Complaint  Patient presents with   Tachycardia   Level of care: Stepdown  Brief Admission History:  Felicia Frank is a 78 y.o. female with medical history significant of HTN, hypothyroidism, Atrial fibrilation, CKD 3A who presents to the emergency department due to sensation of chest tightness and bilateral arm heaviness which started yesterday in the morning, this was associated with low energy.  She checked her heart rate at home and it ranged within 145 to 160 bpm.  This improved on waking up this morning to 80, but this quickly increased again.  Apparently, patient has not been taking her beta-blocker due to low blood pressure. Patient was not on any anticoagulant at this time due to GI bleed and anemia that was stopped when she was initially placed on Eliquis .   ED Course:  Patient was tachypneic, tachycardic, but other vital signs were within normal range.  Workup in the ED showed macrocytic anemia, BMP shows sodium 135, potassium 3.4, chloride 104, bicarb 18, blood glucose 116, BUN/creatinine 36/1.51 (baseline creatinine 1.0-1.1).  Troponin 43 > 52.  BNP 187. Chest x-ray showed no active disease IV Cardizem  10 mg x 1 was given, patient was started on Cardizem  drip.  TRH was asked to admit patient  Assessment and Plan:  Paroxysmal atrial flutter with RVR Patient is being treated with IV amiodarone  infusion She stopped taking her beta-blocker due to low blood pressures -Cardiology consulted, appreciate further evaluation recommendations -pt restarted on IV amiodarone  -avapro  150 mg daily started by cardiology -holding home carvedilol  for now  -after discussion with cardiology, reached out to Dr Shaaron and Dr. Kallie about potential rechallenge pt for full anticoagulation and after discussions they both recommend that it is reasonable to restart full  anticoagulation now with careful monitoring. Dr Alvan discussed with patient and she is agreeable.  We have started IV heparin  infusion with pharm D and will monitor Hg with plan to transition to apixaban  closer to discharge if she tolerates.   -if she does not convert over weekend will plan for DCCV on Mon and would make NPO Sunday evening  -if she converts to SR over weekend, discussed with Dr. Alvan would send home on oral amiodarone  200 mg BID    Hypokalemia/hypomagnesia Maintaining serum K > 4.0, magnesium >2.0 Repleting K and magnesium    Macrocytic anemia/B12 deficiency MCV 103.0; B12< 150 low , folate 12.5-normal -B12 supplement IM x 1 given and daily oral B12 1000 mcg ordered     Acute kidney injury on CKD 3A BUN/creatinine 36/1.51 (baseline creatinine 1.0-1.1).  Renally adjust medications, avoid nephrotoxic agents/dehydration/hypotension   Elevated troponin possibly secondary to type II demand ischemia Troponin 43 > 52 > 85 > 105, - she denies chest pain Continue to monitor troponin, EKG as needed  Hypokalemia -- oral replacement given and repleted   Hypomagnesemia -- repleted    Elevated BNP BNP 187  No respiratory distress, hemodynamically stable, no signs of fluid overload   Continue total input/output, daily weights and fluid restriction Echocardiogram done on 09/21/2023 showed LVEF of 65 to 70%.   No RWMA.  LV diastolic parameters indeterminate.  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   Continue ostomy bag care   GERD Discussed with Dr. Shaaron, BID PPI recommended for GI protection    Compression fracture of L1 Continue Tylenol  as needed Patient states that she has an appointment for surgical repair soon  DVT prophylaxis: SCDs Code Status: Full  Family Communication:  Disposition:    Consultants:   Procedures:   Antimicrobials:    Subjective: Pt had another bout of spontaneous symptomatic Afib RVR overnight, restarted on IV amiodarone  infusion  Objective: Vitals:   02/24/24 1100 02/24/24 1130 02/24/24 1145 02/24/24 1200  BP: 135/85   (!) 153/74  Pulse:      Resp: 19 19 20 20   Temp:   98 F (36.7 C)   TempSrc:   Oral   SpO2: 100% 100% 100% 100%  Weight:      Height:        Intake/Output Summary (Last 24 hours) at 02/24/2024 1209 Last data filed at 02/24/2024 1010 Gross per 24 hour  Intake 1038.39 ml  Output --  Net 1038.39 ml   Filed Weights   02/22/24 0409 02/23/24 0559 02/24/24 0701  Weight: 76.2 kg 73 kg 75.4 kg   Examination:  General exam: Appears calm and comfortable  Respiratory system: Clear to auscultation. Respiratory effort normal. Cardiovascular system: normal S1 & S2 heard. No JVD, murmurs, rubs, gallops or clicks. No pedal edema. Gastrointestinal system: Abdomen is nondistended, soft and nontender. No organomegaly or masses felt. Normal bowel sounds heard. Central nervous system: Alert and oriented. No focal neurological deficits. Extremities: Symmetric 5 x 5 power. Skin: No rashes, lesions or ulcers. Psychiatry: Judgement and insight appear normal. Mood & affect appropriate.   Data Reviewed: I have personally reviewed following labs and imaging studies  CBC: Recent Labs  Lab 02/20/24 1904 02/21/24 0325 02/23/24 0310 02/24/24 0400  WBC 9.9 8.5 8.9 11.2*  NEUTROABS 6.9  --   --   --   HGB 11.4* 10.8* 10.5* 11.3*  HCT 34.5* 32.4* 32.1* 34.1*  MCV 103.0* 103.2* 102.6* 103.0*  PLT 169 146* 173 186    Basic Metabolic  Panel: Recent Labs  Lab 02/20/24 1904 02/21/24 0325 02/22/24 0955 02/23/24 0310 02/24/24 0400  NA 135 137 134* 135 135  K 3.4* 3.5 3.2* 4.0 4.4  CL 104 105 104 108 105  CO2 18* 21* 21* 20* 22  GLUCOSE 116* 104* 102* 96 98  BUN 36* 33* 25* 23 21  CREATININE 1.51* 1.34* 1.31* 1.34* 1.29*  CALCIUM 8.6* 8.5* 8.1* 8.2* 8.3*  MG  --  1.5* 2.1 1.9 2.1  PHOS  --  2.5  --   --   --     CBG: No results for input(s): GLUCAP in the last 168 hours.  Recent Results (from the past 240 hours)  Surgical pcr screen     Status: None   Collection Time: 02/17/24 11:12 AM   Specimen: Nasal Mucosa; Nasal Swab  Result Value Ref Range Status   MRSA, PCR NEGATIVE NEGATIVE Final   Staphylococcus aureus NEGATIVE NEGATIVE Final    Comment: (NOTE) The Xpert SA Assay (FDA approved for NASAL specimens in patients 25 years of age and older), is one component of a comprehensive surveillance program. It is not intended to diagnose infection nor to guide or monitor treatment. Performed at [facility], [address] [place], [place] [address]  MRSA Next Gen by PCR, Nasal     Status: None   Collection Time: 02/21/24  1:16 PM   Specimen: Nasal Mucosa; Nasal Swab  Result Value Ref Range Status   MRSA by PCR Next Gen NOT DETECTED NOT DETECTED Final    Comment: (NOTE) The GeneXpert MRSA Assay (FDA approved for NASAL specimens only), is one component of a comprehensive MRSA colonization surveillance program. It is not intended to diagnose MRSA infection nor to guide or monitor treatment for MRSA infections. Test performance is not FDA approved in patients less than 23 years old. Performed at [facility], [address] [place], [place] [address]      Radiology Studies: DG CHEST PORT 1 VIEW Result Date: 02/23/2024 CLINICAL DATA:  Status post central line placement EXAM: PORTABLE CHEST 1 VIEW COMPARISON:  Chest radiograph dated 02/20/2024 FINDINGS: Lines/tubes: Right internal jugular venous  catheter tip projects over the superior cavoatrial junction. Lungs: Low lung volumes with bronchovascular crowding. Focal opacity projecting over the left costophrenic angle. Pleura: Blunting of the left costophrenic angle.  No pneumothorax. Heart/mediastinum: Similar enlarged cardiomediastinal silhouette. Bones: No acute osseous abnormality. Partially imaged gas-filled mildly dilated upper abdominal bowel loops. IMPRESSION: 1. Right internal jugular venous catheter tip projects over the superior cavoatrial junction. No pneumothorax. 2. Trace left pleural effusion. Opacity projecting over the left costophrenic angle may represent atelectasis. Electronically Signed   By: Limin  Xu M.D.   On: 02/23/2024 14:21   US  EKG SITE RITE Result Date: 02/23/2024 If Site Rite image not attached, placement could not be confirmed due to current cardiac rhythm.   Scheduled Meds:  Chlorhexidine  Gluconate Cloth  6 each Topical Q0600   vitamin B-12  1,000 mcg Oral Daily   diltiazem   30 mg Oral Q6H   irbesartan   150 mg Oral Daily   levothyroxine   100 mcg Oral QAC breakfast   magnesium  oxide  400 mg Oral Daily   multivitamin with minerals  1 tablet Oral Daily   pantoprazole   40 mg Oral BID   Ensure Max Protein  11 oz Oral Daily   Continuous Infusions:  amiodarone  30 mg/hr (02/24/24 1010)   heparin  900 Units/hr (02/24/24 1115)    LOS: 2 days   Critical Care Procedure Note Authorized and Performed by: KYM Louder MD  Total Critical Care time:  57 mins Due to a high probability of clinically significant, life threatening deterioration, the patient required my highest level of preparedness to intervene emergently and I personally spent this critical care time directly and personally managing the patient.  This critical care time included obtaining a history; examining the patient, pulse oximetry; ordering and review of studies; arranging urgent treatment with development of a management plan; evaluation of patient's  response of treatment; frequent reassessment; and discussions with other providers.  This critical care time was performed to assess and manage the high probability of imminent and life threatening deterioration that could result in multi-organ failure.  It was exclusive of separately billable procedures and treating other patients and teaching time.    Afton Louder, MD How to contact the [facility] Attending or Consulting provider 7A - 7P or covering provider during after hours 7P -7A, for this patient?  Check the care team in [facility] and look for a) attending/consulting TR[facility] provider listed and b) the [facility] team listed Log into [email or web address] to find provider on call.  Locate the [facility] provider you are looking for under [facility] and page to a number that you can be  directly reached. If you still have difficulty reaching the provider, please page the [facility] (Director on Call) for the Hospitalists listed on amion for assistance.  02/24/2024, 12:09 PM

## 2024-02-24 NOTE — Progress Notes (Signed)
 Tele monitor shows patient converted to NSR around 1237. Heart rates maintaining around 80. Amio gtt continues at 30mg /hr.

## 2024-02-24 NOTE — Progress Notes (Signed)
 Patient ambulated around the unit with walker. Patient's heart rate remained in Sinus. Increased rates with exertion up to 110s-120s. Once back in the bed, patient's heart rate recovered back down to NSR 80s.

## 2024-02-24 NOTE — Progress Notes (Signed)
 PHARMACY - ANTICOAGULATION CONSULT NOTE  Pharmacy Consult for heparin  Indication: atrial fibrillation  Allergies  Allergen Reactions   Amlodipine Swelling   Clonidine Rash    Rash with patch only.  Okay to take pill    Patient Measurements: Height: 5' 3.5 (161.3 cm) Weight: 75.4 kg (166 lb 3.6 oz) IBW/kg (Calculated) : 53.55 HEPARIN  DW (KG): 68.7  Vital Signs: Temp: 98.2 F (36.8 C) (09/26 0701) Temp Source: Axillary (09/26 0701) BP: 110/51 (09/26 1000) Pulse Rate: 154 (09/26 1000)  Labs: Recent Labs    02/22/24 0955 02/23/24 0310 02/24/24 0400  HGB  --  10.5* 11.3*  HCT  --  32.1* 34.1*  PLT  --  173 186  CREATININE 1.31* 1.34* 1.29*    Estimated Creatinine Clearance: 35.3 mL/min (A) (by C-G formula based on SCr of 1.29 mg/dL (H)).   Medical History: Past Medical History:  Diagnosis Date   Anemia    Atrial fibrillation (HCC)    Chronic back pain    Chronic kidney disease    Diverticulosis    History of fall    HTN (hypertension)    Hypothyroidism    Osteoporosis     Assessment: 78 year old female with history of afib. Taken off anticoagulation in May and then may have been lost to follow up and never restarted on anticoagulation. New orders to start heparin  with plans to resume DOAC closer to discharge. Hemoglobin stable in 11s.   Goal of Therapy:  Heparin  level 0.3-0.7 units/ml Monitor platelets by anticoagulation protocol: Yes   Plan:  Give 3000 units bolus x 1 Start heparin  infusion at 900 units/hr Check anti-Xa level in 8 hours and daily while on heparin  Continue to monitor H&H and platelets  Dempsey Blush PharmD., BCPS Clinical Pharmacist 02/24/2024 10:39 AM

## 2024-02-24 NOTE — Care Management Important Message (Signed)
 Important Message  Patient Details  Name: Felicia Frank MRN: [id number] Date of Birth: 03/18/1946   Important Message Given:  Yes - Medicare IM     Arran Fessel L Maleeya Peterkin 02/24/2024, 4:12 PM

## 2024-02-24 NOTE — TOC Initial Note (Signed)
 Transition of Care [facility]) - Initial/Assessment Note    Patient Details  Name: Felicia Frank MRN: [id number] Date of Birth: 1946-04-13  Transition of Care [facility]) CM/SW Contact:    Noreen KATHEE Pinal, LCSWA Phone Number: 02/24/2024, 10:28 AM  Clinical Narrative:                   CSW spoke with patient and assessed her due to high admission score. Patient was admitted for Paroxysmal atrial flutter . Patient reports that she is fairly independent , still able to drive, have support from an individual who comes to take her husband out and around. Patient states that she uses a walker as needed . Patient states that [facility] was very helpful when she had them after her surgery. CSW will continue to follow.    Expected Discharge Plan: Home/Self Care Barriers to Discharge: Continued Medical Work up   Patient Goals and CMS Choice Patient states their goals for this hospitalization and ongoing recovery are:: get better CMS Medicare.gov Compare Post Acute Care list provided to:: Patient Choice offered to / list presented to : Patient      Expected Discharge Plan and Services In-house Referral: Clinical Social Work     Living arrangements for the past 2 months: Single Family Home                   DME Agency:  [employer])         HH Agency: [facility]        Prior Living Arrangements/Services Living arrangements for the past 2 months: Single Family Home Lives with:: Spouse Patient language and need for interpreter reviewed:: Yes Do you feel safe going back to the place where you live?: Yes      Need for Family Participation in Patient Care: Yes (Comment) Care giver support system in place?: Yes (comment) Current home services: DME, Other (comment) (Home aide) Criminal Activity/Legal Involvement Pertinent to Current Situation/Hospitalization: No - Comment as needed  Activities of Daily Living   ADL Screening (condition at time of admission) Independently performs ADLs?: Yes  (appropriate for developmental age) Is the patient deaf or have difficulty hearing?: No Does the patient have difficulty seeing, even when wearing glasses/contacts?: No Does the patient have difficulty concentrating, remembering, or making decisions?: No  Permission Sought/Granted      Share Information with NAME: Bernece     Permission granted to share info w Relationship: Patient     Emotional Assessment Appearance:: Appears stated age Attitude/Demeanor/Rapport: Engaged Affect (typically observed): Accepting Orientation: : Oriented to Self, Oriented to Place, Oriented to  Time, Oriented to Situation Alcohol / Substance Use: Not Applicable Psych Involvement: No (comment)  Admission diagnosis:  Renal insufficiency [N28.9] Atrial flutter with rapid ventricular response (HCC) [I48.92] Paroxysmal atrial flutter (HCC) [I48.92] Patient Active Problem List   Diagnosis Date Noted   Paroxysmal atrial flutter (HCC) 02/20/2024   Macrocytic anemia 02/20/2024   Elevated troponin 02/20/2024   Elevated brain natriuretic peptide (BNP) level 02/20/2024   Hypoalbuminemia due to protein-calorie malnutrition 02/20/2024   ABLA (acute blood loss anemia) 10/21/2023   Typical atrial flutter (HCC) 10/20/2023   Acute kidney injury superimposed on chronic kidney disease 10/19/2023   Hypokalemia 10/19/2023   Gastrointestinal hemorrhage 10/19/2023   Chronic anemia 09/26/2023   Obesity, 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   Atrial fibrillation, new onset (HCC) 09/18/2023   Compression fracture of L1 lumbar vertebra (HCC) 09/16/2023   Diverticulitis of colon with perforation 09/16/2023   Hyponatremia 09/16/2023  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 [place] [address] Phone: [phone] Fax: [phone]     Social Drivers of Health (SDOH) Social History: SDOH Screenings   Food Insecurity: No Food Insecurity (02/21/2024)  Housing: Low Risk  (02/21/2024)  Transportation Needs: No Transportation Needs (02/21/2024)  Utilities: Not At Risk (02/21/2024)  Social Connections: Socially Integrated (02/21/2024)  Tobacco Use: Low Risk  (02/20/2024)   SDOH Interventions:     Readmission Risk Interventions    02/24/2024   10:10 AM 10/10/2023   11:26 AM 10/07/2023   10:06 PM  Readmission Risk Prevention Plan  Transportation Screening Complete Complete Complete  PCP or Specialist Appt within 5-7 Days   Complete  Home Care Screening   Complete  Medication Review (RN CM)   Complete  HRI or Home Care Consult Complete Complete   Social Work Consult for Recovery Care Planning/Counseling Complete Complete   Palliative Care Screening Not Applicable Not Applicable   Medication Review Oceanographer) Complete Complete

## 2024-02-25 DIAGNOSIS — I10 Essential (primary) hypertension: Secondary | ICD-10-CM | POA: Diagnosis not present

## 2024-02-25 DIAGNOSIS — I4892 Unspecified atrial flutter: Secondary | ICD-10-CM | POA: Diagnosis not present

## 2024-02-25 DIAGNOSIS — E039 Hypothyroidism, unspecified: Secondary | ICD-10-CM | POA: Diagnosis not present

## 2024-02-25 LAB — CBC
HCT: 29 % — ABNORMAL LOW (ref 36.0–46.0)
Hemoglobin: 9.5 g/dL — ABNORMAL LOW (ref 12.0–15.0)
MCH: 33.9 pg (ref 26.0–34.0)
MCHC: 32.8 g/dL (ref 30.0–36.0)
MCV: 103.6 fL — ABNORMAL HIGH (ref 80.0–100.0)
Platelets: 175 K/uL (ref 150–400)
RBC: 2.8 MIL/uL — ABNORMAL LOW (ref 3.87–5.11)
RDW: 13.7 % (ref 11.5–15.5)
WBC: 9.6 K/uL (ref 4.0–10.5)
nRBC: 0 % (ref 0.0–0.2)

## 2024-02-25 LAB — BASIC METABOLIC PANEL WITH GFR
Anion gap: 6 (ref 5–15)
BUN: 30 mg/dL — ABNORMAL HIGH (ref 8–23)
CO2: 23 mmol/L (ref 22–32)
Calcium: 8.1 mg/dL — ABNORMAL LOW (ref 8.9–10.3)
Chloride: 103 mmol/L (ref 98–111)
Creatinine, Ser: 1.59 mg/dL — ABNORMAL HIGH (ref 0.44–1.00)
GFR, Estimated: 33 mL/min — ABNORMAL LOW (ref >60)
Glucose, Bld: 108 mg/dL — ABNORMAL HIGH (ref 70–99)
Potassium: 3.7 mmol/L (ref 3.5–5.1)
Sodium: 132 mmol/L — ABNORMAL LOW (ref 135–145)

## 2024-02-25 LAB — HEPARIN LEVEL (UNFRACTIONATED)
Heparin Unfractionated: 0.58 [IU]/mL (ref 0.30–0.70)
Heparin Unfractionated: 0.92 [IU]/mL — ABNORMAL HIGH (ref 0.30–0.70)
Heparin Unfractionated: 0.49 [IU]/mL (ref 0.30–0.70)

## 2024-02-25 LAB — MAGNESIUM: Magnesium: 1.8 mg/dL (ref 1.7–2.4)

## 2024-02-25 MED ORDER — AMIODARONE LOAD VIA INFUSION
150.0000 mg | Freq: Once | INTRAVENOUS | Status: AC
  Administered 2024-02-25: 150 mg via INTRAVENOUS
  Filled 2024-02-25: qty 83.34

## 2024-02-25 MED ORDER — MAGNESIUM SULFATE 2 GM/50ML IV SOLN
2.0000 g | Freq: Once | INTRAVENOUS | Status: AC
  Administered 2024-02-25: 2 g via INTRAVENOUS
  Filled 2024-02-25: qty 50

## 2024-02-25 NOTE — Progress Notes (Signed)
 Patient appears to convert to NSR on tele. HR 84. Dr. Vicci at bedside and aware. Amio bolus of 150mg  given and Amio drip continues at 30mg /hr.

## 2024-02-25 NOTE — Progress Notes (Signed)
 PHARMACY - ANTICOAGULATION CONSULT NOTE  Pharmacy Consult for heparin  Indication: atrial fibrillation  Allergies  Allergen Reactions   Amlodipine Swelling   Clonidine Rash    Rash with patch only.  Okay to take pill    Patient Measurements: Height: 5' 3.5 (161.3 cm) Weight: 74.7 kg (164 lb 10.9 oz) IBW/kg (Calculated) : 53.55 HEPARIN  DW (KG): 68.7  Vital Signs: Temp: 97.9 F (36.6 C) (09/27 1945) Temp Source: Oral (09/27 1945) BP: 123/35 (09/27 2000) Pulse Rate: 76 (09/27 2000)  Labs: Recent Labs    02/23/24 0310 02/24/24 0400 02/24/24 2000 02/25/24 0422 02/25/24 1113 02/25/24 1947  HGB 10.5* 11.3*  --  9.5*  --   --   HCT 32.1* 34.1*  --  29.0*  --   --   PLT 173 186  --  175  --   --   HEPARINUNFRC  --   --    < > 0.58 0.92* 0.49  CREATININE 1.34* 1.29*  --  1.59*  --   --    < > = values in this interval not displayed.    Estimated Creatinine Clearance: 28.5 mL/min (A) (by C-G formula based on SCr of 1.59 mg/dL (H)).   Medical History: Past Medical History:  Diagnosis Date   Anemia    Atrial fibrillation (HCC)    Chronic back pain    Chronic kidney disease    Diverticulosis    History of fall    HTN (hypertension)    Hypothyroidism    Osteoporosis     Assessment: 78 year old female with history of afib. Taken off anticoagulation in May and then may have been lost to follow up and never restarted on anticoagulation. New orders to start heparin  with plans to resume DOAC closer to discharge. Hemoglobin stable in 11s.   HL 0.49 - therapeutic x 1  Goal of Therapy:  Heparin  level 0.3-0.7 units/ml Monitor platelets by anticoagulation protocol: Yes   Plan:   - Continue Heparin  infusion at 700 units/hr - Check HL in 8 hours for confirmation - Daily CBC while on Heparin  infusion  Olam Fritter, PharmD, BCPS 02/25/2024 9:39 PM

## 2024-02-25 NOTE — Progress Notes (Signed)
 PHARMACY - ANTICOAGULATION CONSULT NOTE  Pharmacy Consult for heparin  Indication: atrial fibrillation  Allergies  Allergen Reactions   Amlodipine Swelling   Clonidine Rash    Rash with patch only.  Okay to take pill    Patient Measurements: Height: 5' 3.5 (161.3 cm) Weight: 74.7 kg (164 lb 10.9 oz) IBW/kg (Calculated) : 53.55 HEPARIN  DW (KG): 68.7  Vital Signs: Temp: 98.4 F (36.9 C) (09/27 0351) Temp Source: Oral (09/27 0351) BP: 127/41 (09/27 1116) Pulse Rate: 95 (09/27 0700)  Labs: Recent Labs    02/23/24 0310 02/24/24 0400 02/24/24 2000 02/25/24 0422 02/25/24 1113  HGB 10.5* 11.3*  --  9.5*  --   HCT 32.1* 34.1*  --  29.0*  --   PLT 173 186  --  175  --   HEPARINUNFRC  --   --  0.79* 0.58 0.92*  CREATININE 1.34* 1.29*  --  1.59*  --     Estimated Creatinine Clearance: 28.5 mL/min (A) (by C-G formula based on SCr of 1.59 mg/dL (H)).   Medical History: Past Medical History:  Diagnosis Date   Anemia    Atrial fibrillation (HCC)    Chronic back pain    Chronic kidney disease    Diverticulosis    History of fall    HTN (hypertension)    Hypothyroidism    Osteoporosis     Assessment: 78 year old female with history of afib. Taken off anticoagulation in May and then may have been lost to follow up and never restarted on anticoagulation. New orders to start heparin  with plans to resume DOAC closer to discharge. Hemoglobin stable in 11s.   HL 0.92- supratherapeutic  Goal of Therapy:  Heparin  level 0.3-0.7 units/ml Monitor platelets by anticoagulation protocol: Yes   Plan:   - Decrease Heparin  infusion to 700 units/hr - Check HL in 8 hours - Daily CBC while on Heparin  infusion  Elspeth Sour, PharmD Clinical Pharmacist 02/25/2024 11:56 AM

## 2024-02-25 NOTE — Progress Notes (Signed)
   02/25/24 0646  Vitals  Pulse Rate (!) (S)  153   Patient was using the [facility], cleaning herself and this RN changed a Colostomy pouch, and as eevrything was done, patient wanted to sit on the edge of the bed, while doing that, patient HR went as high as 168, Afib with RVR, Amio gtt running, Cardizem  pills scheduled Q6, got patient to bed and asked her to rest, HR fluctuates between 120-140s. Will endorse to the oncoming RN and the MD.

## 2024-02-25 NOTE — Progress Notes (Signed)
 PHARMACY - ANTICOAGULATION CONSULT NOTE  Pharmacy Consult for heparin  Indication: atrial fibrillation  Labs: Recent Labs    02/22/24 0955 02/23/24 0310 02/23/24 0310 02/24/24 0400 02/24/24 2000 02/25/24 0422  HGB  --  10.5*   < > 11.3*  --  9.5*  HCT  --  32.1*  --  34.1*  --  29.0*  PLT  --  173  --  186  --  175  HEPARINUNFRC  --   --   --   --  0.79* 0.58  CREATININE 1.31* 1.34*  --  1.29*  --   --    < > = values in this interval not displayed.   Assessment/Plan:  78yo female therapeutic on heparin  after rate change. Will continue infusion at current rate of 800 units/hr and confirm stable with additional level.  Marvetta Dauphin, PharmD, BCPS 02/25/2024 5:34 AM

## 2024-02-25 NOTE — Progress Notes (Signed)
 PROGRESS NOTE   Felicia Frank  FMW:[id number] DOB: Sep 26, 1945 DOA: 02/20/2024 PCP: Dino Devere RAMAN, MD   Chief Complaint  Patient presents with   Tachycardia   Level of care: Stepdown  Brief Admission History:  Felicia Frank is a 78 y.o. female with medical history significant of HTN, hypothyroidism, Atrial fibrilation, CKD 3A who presents to the emergency department due to sensation of chest tightness and bilateral arm heaviness which started yesterday in the morning, this was associated with low energy.  She checked her heart rate at home and it ranged within 145 to 160 bpm.  This improved on waking up this morning to 80, but this quickly increased again.  Apparently, patient has not been taking her beta-blocker due to low blood pressure. Patient was not on any anticoagulant at this time due to GI bleed and anemia that was stopped when she was initially placed on Eliquis .   ED Course:  Patient was tachypneic, tachycardic, but other vital signs were within normal range.  Workup in the ED showed macrocytic anemia, BMP shows sodium 135, potassium 3.4, chloride 104, bicarb 18, blood glucose 116, BUN/creatinine 36/1.51 (baseline creatinine 1.0-1.1).  Troponin 43 > 52.  BNP 187. Chest x-ray showed no active disease IV Cardizem  10 mg x 1 was given, patient was started on Cardizem  drip.  [facility] was asked to admit patient  Assessment and Plan:  Paroxysmal atrial flutter with RVR Patient is being treated with IV amiodarone  infusion She stopped taking her beta-blocker due to low blood pressures -Cardiology consulted, appreciate further evaluation recommendations -pt restarted on IV amiodarone  -avapro  150 mg daily started by cardiology -holding home carvedilol  for now  -after discussion with cardiology, reached out to Dr Shaaron and Dr. Kallie about potential rechallenge pt for full anticoagulation and after discussions they both recommend that it is reasonable to restart full  anticoagulation now with careful monitoring. Dr Alvan discussed with patient and she is agreeable.  We have started IV heparin  infusion with pharm D and will monitor Hg with plan to transition to apixaban  closer to discharge if she tolerates.   -if she does not convert over weekend will plan for DCCV on Mon and would make NPO Sunday evening  -if she converts to SR over weekend, discussed with Dr. Alvan would send home on oral amiodarone  200 mg BID  -pt converted to SR in last 24 hours but went back into aflutter RVR, another bolus amio given and now HR is slowing down and hopefully will convert back to SR, continue IV amio and scrap plans to transition to oral amiodarone  today -pt started on IV heparin  9/26 after discussions noted and Hg down today to 9.5 but I think this may be hemodilution from 2 current continuous IV infusions, pt reports no black or blood stools but we will continue to follow this closely. Continue BID PPI for GI protection.    Hypokalemia/hypomagnesia Maintaining serum K > 4.0, magnesium >2.0 Repleting K and magnesium    Macrocytic anemia/B12 deficiency MCV 103.0; B12< 150 low , folate 12.5-normal -B12 supplement IM x 1 given and daily oral B12 1000 mcg ordered     Acute kidney injury on CKD 3A BUN/creatinine 36/1.51 (baseline creatinine 1.0-1.1).  Renally adjust medications, avoid nephrotoxic agents/dehydration/hypotension   Elevated troponin possibly secondary to type II demand ischemia Troponin 43 > 52 > 85 > 105, - she denies chest pain Continue to monitor troponin, EKG as needed  Hypokalemia -- oral replacement given and repleted   Hypomagnesemia -- repleted  Elevated BNP BNP 187  No respiratory distress, hemodynamically stable, no signs of fluid overload   Continue total input/output, daily weights and fluid restriction Echocardiogram done on 09/21/2023 showed LVEF of 65 to 70%.   No RWMA.  LV diastolic parameters indeterminate.  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  Continue ostomy bag care   GERD Discussed with Dr. Shaaron, BID PPI recommended for GI protection    Compression fracture of L1 Continue Tylenol  as needed Patient states that she has an appointment for surgical repair soon  DVT prophylaxis: SCDs Code Status: Full  Family Communication:  Disposition:    Consultants:   Procedures:   Antimicrobials:    Subjective: Pt disappointed she converted back to Aflutter RVR this morning, she was given an IV bolus of amio and now HR starting to improve, she was SOB and feeling the palpitations earlier but now seems to be getting better.  She said it started when she was up on the bedside commode.   Objective: Vitals:   02/25/24 0739 02/25/24 0900 02/25/24 1000 02/25/24 1116  BP:  (!) 164/47 (!) 143/54 (!) 127/41  Pulse:      Resp: 16 20 (!) 25   Temp:      TempSrc:      SpO2:      Weight:      Height:        Intake/Output Summary (Last 24 hours) at 02/25/2024 1311 Last data filed at 02/25/2024 0610 Gross per 24 hour  Intake --  Output 50 ml  Net -50 ml   Filed Weights   02/23/24 0559 02/24/24 0701 02/25/24 0617  Weight: 73 kg 75.4 kg 74.7 kg   Examination:  General exam: Appears calm and comfortable  Respiratory system: Clear to auscultation. Respiratory effort normal. Cardiovascular system: normal S1 & S2 heard. No JVD, murmurs, rubs, gallops or clicks. No pedal edema. Gastrointestinal system: Abdomen is nondistended, soft and nontender.  No organomegaly or masses felt. Normal bowel sounds heard. Central nervous system: Alert and oriented. No focal neurological deficits. Extremities: Symmetric 5 x 5 power. Skin: No rashes, lesions or ulcers. Psychiatry: Judgement and insight appear normal. Mood & affect appropriate.   Data Reviewed: I have personally reviewed following labs and imaging studies  CBC: Recent Labs  Lab 02/20/24 1904 02/21/24 0325 02/23/24 0310 02/24/24 0400 02/25/24 0422  WBC 9.9 8.5 8.9 11.2* 9.6  NEUTROABS 6.9  --   --   --   --   HGB 11.4* 10.8* 10.5* 11.3* 9.5*  HCT 34.5* 32.4* 32.1* 34.1* 29.0*  MCV 103.0* 103.2* 102.6* 103.0* 103.6*  PLT 169 146* 173 186 175    Basic Metabolic Panel: Recent Labs  Lab 02/21/24 0325 02/22/24 0955 02/23/24 0310 02/24/24 0400 02/25/24 0422  NA 137 134* 135 135 132*  K 3.5 3.2* 4.0 4.4 3.7  CL 105 104 108 105 103  CO2 21* 21* 20* 22 23  GLUCOSE 104* 102* 96 98 108*  BUN 33* 25* 23 21 30*  CREATININE 1.34* 1.31* 1.34* 1.29* 1.59*  CALCIUM 8.5* 8.1* 8.2* 8.3* 8.1*  MG 1.5* 2.1 1.9 2.1 1.8  PHOS 2.5  --   --   --   --     CBG: No results for input(s): GLUCAP in the last 168 hours.  Recent Results (from the past 240 hours)  Surgical pcr screen     Status: None   Collection Time: 02/17/24 11:12 AM   Specimen: Nasal Mucosa; Nasal Swab  Result Value Ref Range Status   MRSA, PCR NEGATIVE NEGATIVE Final   Staphylococcus aureus NEGATIVE NEGATIVE Final    Comment: (NOTE) The Xpert SA Assay (FDA approved for NASAL specimens in patients 72 years of age and older), is one component of a comprehensive surveillance program. It is not intended to diagnose infection nor to guide or monitor treatment. Performed at [facility], [address] [place], [place] [address]   MRSA Next Gen by PCR, Nasal     Status: None   Collection Time: 02/21/24  1:16 PM   Specimen: Nasal Mucosa; Nasal Swab  Result Value Ref Range Status   MRSA by PCR Next Gen NOT  DETECTED NOT DETECTED Final    Comment: (NOTE) The GeneXpert MRSA Assay (FDA approved for NASAL specimens only), is one component of a comprehensive MRSA colonization surveillance program. It is not intended to diagnose MRSA infection nor to guide or monitor treatment for MRSA infections. Test performance is not FDA approved in patients less than 41 years old. Performed at [facility], [address] [place], [place] [address]      Radiology Studies: DG CHEST PORT 1 VIEW Result Date: 02/23/2024 CLINICAL DATA:  Status post central line placement EXAM: PORTABLE CHEST 1 VIEW COMPARISON:  Chest radiograph dated 02/20/2024 FINDINGS: Lines/tubes: Right internal jugular venous catheter tip projects over the superior cavoatrial junction. Lungs: Low lung volumes with bronchovascular crowding. Focal opacity projecting over the left costophrenic angle. Pleura: Blunting of the left costophrenic angle.  No pneumothorax. Heart/mediastinum: Similar enlarged cardiomediastinal silhouette. Bones: No acute osseous abnormality. Partially imaged gas-filled mildly dilated upper abdominal bowel loops. IMPRESSION: 1. Right internal jugular venous catheter tip projects over the superior cavoatrial junction. No pneumothorax. 2. Trace left pleural effusion. Opacity projecting over the left costophrenic angle may represent atelectasis. Electronically Signed   By: Limin  Xu M.D.   On: 02/23/2024 14:21    Scheduled Meds:  Chlorhexidine  Gluconate Cloth  6 each Topical Q0600   vitamin B-12  1,000 mcg Oral Daily   diltiazem   30 mg Oral Q6H   irbesartan   150 mg Oral Daily   levothyroxine   100 mcg Oral QAC breakfast   magnesium  oxide  400 mg Oral Daily   multivitamin with minerals  1 tablet Oral Daily   pantoprazole   40 mg Oral BID   Ensure Max Protein  11 oz Oral Daily   Continuous Infusions:  amiodarone  30 mg/hr (02/25/24 0928)   heparin  700 Units/hr (02/25/24 1200)    LOS: 3 days   Critical Care Procedure  Note Authorized and Performed by: KYM Louder MD  Total Critical Care time:  55 mins Due to a high probability of clinically significant, life threatening deterioration, the patient required my highest level of preparedness to intervene emergently and I personally spent this critical care time directly and personally managing the patient.  This critical care time included obtaining a history; examining the patient, pulse oximetry; ordering and review of studies; arranging urgent treatment with development of a management plan; evaluation of patient's response of treatment; frequent reassessment; and discussions with other providers.  This critical care time was  performed to assess and manage the high probability of imminent and life threatening deterioration that could result in multi-organ failure.  It was exclusive of separately billable procedures and treating other patients and teaching time.    Afton Louder, MD How to contact the [facility] Attending or Consulting provider 7A - 7P or covering provider during after hours 7P -7A, for this patient?  Check the care team in [facility] and look for a) attending/consulting [facility] provider listed and b) the [facility] team listed Log into [email or web address] to find provider on call.  Locate the [facility] provider you are looking for under [facility] and page to a number that you can be directly reached. If you still have difficulty reaching the provider, please page the [facility] (Director on Call) for the Hospitalists listed on amion for assistance.  02/25/2024, 1:11 PM

## 2024-02-25 NOTE — Plan of Care (Addendum)
 This patient remains in [facility] as of time of writing. The patient is AA+Ox4. The patient has a triple lumen CVC to right subclavian area; receiving active infusions of heparin  and amiodarone . No supplemental O2 requirement at this time.  Edit: 02/25/24, 2210 hours: O2 at 2L/min via [place] applied to the patient for SPO2 as low as 70% while sleeping / irregular breathing pattern while sleeping.  Problem: Education: Goal: Knowledge of General Education information will improve Description: Including pain rating scale, medication(s)/side effects and non-pharmacologic comfort measures Outcome: Progressing   Problem: Health Behavior/Discharge Planning: Goal: Ability to manage health-related needs will improve Outcome: Progressing   Problem: Clinical Measurements: Goal: Ability to maintain clinical measurements within normal limits will improve Outcome: Progressing Goal: Will remain free from infection Outcome: Progressing Goal: Diagnostic test results will improve Outcome: Progressing Goal: Respiratory complications will improve Outcome: Progressing Goal: Cardiovascular complication will be avoided Outcome: Progressing   Problem: Activity: Goal: Risk for activity intolerance will decrease Outcome: Progressing   Problem: Nutrition: Goal: Adequate nutrition will be maintained Outcome: Progressing   Problem: Coping: Goal: Level of anxiety will decrease Outcome: Progressing   Problem: Elimination: Goal: Will not experience complications related to bowel motility Outcome: Progressing Goal: Will not experience complications related to urinary retention Outcome: Progressing   Problem: Pain Managment: Goal: General experience of comfort will improve and/or be controlled Outcome: Progressing   Problem: Safety: Goal: Ability to remain free from injury will improve Outcome: Progressing   Problem: Skin Integrity: Goal: Risk for impaired skin integrity will decrease Outcome:  Progressing   Problem: Education: Goal: Knowledge of disease or condition will improve Outcome: Progressing Goal: Understanding of medication regimen will improve Outcome: Progressing Goal: Individualized Educational Video(s) Outcome: Progressing   Problem: Activity: Goal: Ability to tolerate increased activity will improve Outcome: Progressing   Problem: Cardiac: Goal: Ability to achieve and maintain adequate cardiopulmonary perfusion will improve Outcome: Progressing   Problem: Health Behavior/Discharge Planning: Goal: Ability to safely manage health-related needs after discharge will improve Outcome: Progressing

## 2024-02-25 NOTE — Progress Notes (Signed)
 Patient ambulated around the unit a couple of times with walker. HR remained in the 80s-90s and NSR. Tolerated well; no complaints from the patient.

## 2024-02-26 DIAGNOSIS — I4892 Unspecified atrial flutter: Secondary | ICD-10-CM | POA: Diagnosis not present

## 2024-02-26 DIAGNOSIS — D539 Nutritional anemia, unspecified: Secondary | ICD-10-CM | POA: Diagnosis not present

## 2024-02-26 DIAGNOSIS — E876 Hypokalemia: Secondary | ICD-10-CM | POA: Diagnosis not present

## 2024-02-26 DIAGNOSIS — N179 Acute kidney failure, unspecified: Secondary | ICD-10-CM | POA: Diagnosis not present

## 2024-02-26 LAB — BASIC METABOLIC PANEL WITH GFR
Anion gap: 5 (ref 5–15)
BUN: 25 mg/dL — ABNORMAL HIGH (ref 8–23)
CO2: 24 mmol/L (ref 22–32)
Calcium: 8.1 mg/dL — ABNORMAL LOW (ref 8.9–10.3)
Chloride: 103 mmol/L (ref 98–111)
Creatinine, Ser: 1.36 mg/dL — ABNORMAL HIGH (ref 0.44–1.00)
GFR, Estimated: 40 mL/min — ABNORMAL LOW (ref >60)
Glucose, Bld: 85 mg/dL (ref 70–99)
Potassium: 4.1 mmol/L (ref 3.5–5.1)
Sodium: 132 mmol/L — ABNORMAL LOW (ref 135–145)

## 2024-02-26 LAB — CBC
HCT: 26.6 % — ABNORMAL LOW (ref 36.0–46.0)
Hemoglobin: 8.7 g/dL — ABNORMAL LOW (ref 12.0–15.0)
MCH: 33.5 pg (ref 26.0–34.0)
MCHC: 32.7 g/dL (ref 30.0–36.0)
MCV: 102.3 fL — ABNORMAL HIGH (ref 80.0–100.0)
Platelets: 162 K/uL (ref 150–400)
RBC: 2.6 MIL/uL — ABNORMAL LOW (ref 3.87–5.11)
RDW: 13.6 % (ref 11.5–15.5)
WBC: 8.5 K/uL (ref 4.0–10.5)
nRBC: 0 % (ref 0.0–0.2)

## 2024-02-26 LAB — HEPARIN LEVEL (UNFRACTIONATED): Heparin Unfractionated: 0.36 [IU]/mL (ref 0.30–0.70)

## 2024-02-26 LAB — OCCULT BLOOD X 1 CARD TO LAB, STOOL: Fecal Occult Bld: NEGATIVE

## 2024-02-26 LAB — MAGNESIUM: Magnesium: 2.1 mg/dL (ref 1.7–2.4)

## 2024-02-26 MED ORDER — AMIODARONE HCL 200 MG PO TABS
200.0000 mg | ORAL_TABLET | Freq: Two times a day (BID) | ORAL | Status: DC
  Administered 2024-02-26 – 2024-03-02 (×11): 200 mg via ORAL
  Filled 2024-02-26 (×11): qty 1

## 2024-02-26 MED ORDER — DOCUSATE SODIUM 100 MG PO CAPS
100.0000 mg | ORAL_CAPSULE | Freq: Every evening | ORAL | Status: DC
  Administered 2024-02-26 – 2024-03-01 (×5): 100 mg via ORAL
  Filled 2024-02-26 (×5): qty 1

## 2024-02-26 MED ORDER — DILTIAZEM HCL 60 MG PO TABS
60.0000 mg | ORAL_TABLET | Freq: Four times a day (QID) | ORAL | Status: AC
  Administered 2024-02-26 – 2024-02-28 (×7): 60 mg via ORAL
  Filled 2024-02-26 (×7): qty 1

## 2024-02-26 NOTE — Progress Notes (Addendum)
 PROGRESS NOTE   Felicia Frank  FMW:[id number] DOB: Aug 15, 1945 DOA: 02/20/2024 PCP: Dino Devere RAMAN, MD   Chief Complaint  Patient presents with   Tachycardia   Level of care: Stepdown  Brief Admission History:  Felicia Frank is a 78 y.o. female with medical history significant of HTN, hypothyroidism, Atrial fibrilation, CKD 3A who presents to the emergency department due to sensation of chest tightness and bilateral arm heaviness which started yesterday in the morning, this was associated with low energy.  She checked her heart rate at home and it ranged within 145 to 160 bpm.  This improved on waking up this morning to 80, but this quickly increased again.  Apparently, patient has not been taking her beta-blocker due to low blood pressure. Patient was not on any anticoagulant at this time due to GI bleed and anemia that was stopped when she was initially placed on Eliquis .   ED Course:  Patient was tachypneic, tachycardic, but other vital signs were within normal range.  Workup in the ED showed macrocytic anemia, BMP shows sodium 135, potassium 3.4, chloride 104, bicarb 18, blood glucose 116, BUN/creatinine 36/1.51 (baseline creatinine 1.0-1.1).  Troponin 43 > 52.  BNP 187. Chest x-ray showed no active disease IV Cardizem  10 mg x 1 was given, patient was started on Cardizem  drip.  TRH was asked to admit patient  Assessment and Plan:  Paroxysmal atrial flutter with RVR Patient is being treated with IV amiodarone  infusion She stopped taking her beta-blocker due to low blood pressures -Cardiology consulted, appreciate further evaluation recommendations -pt restarted on IV amiodarone  -avapro  150 mg daily started by cardiology -holding home carvedilol  for now  -after discussion with cardiology, reached out to Dr Shaaron and Dr. Kallie about potential rechallenge pt for full anticoagulation and after discussions they both recommend that it is reasonable to restart full  anticoagulation now with careful monitoring. Dr Alvan discussed with patient and she is agreeable.  We have started IV heparin  infusion with pharm D and will monitor Hg with plan to transition to apixaban  closer to discharge if she tolerates.   -if she does not convert over weekend will plan for DCCV on Mon and would make NPO Sunday evening  -if she converts to SR over weekend, discussed with Dr. Alvan would send home on oral amiodarone  200 mg BID  -pt converted to SR in last 24 hours but went back into aflutter RVR, another bolus amio given and now HR is slowing down and hopefully will convert back to SR, continue IV amio and scrap plans to transition to oral amiodarone  today -pt started on IV heparin  9/26 after discussions noted and Hg down today to 9.5 but I think this may be hemodilution from 2 current continuous IV infusions, pt reports no black or blood stools but we will continue to follow this closely. Continue BID PPI for GI protection.  -trial of coming off IV amio again today to oral amiodarone  200 mg BID -NPO after midnight in case she requires cardioversion tomorrow with cardiology team -she remains on IV heparin  infusion and closely monitoring Hg -with elevated BPs, increasing diltiazem  to 60 mg every 6 hours      Hypokalemia/hypomagnesia Maintaining serum K > 4.0, magnesium >2.0 Repleted K (4.1) and magnesium  (2.1)   Macrocytic anemia/B12 deficiency MCV 103.0; B12< 150 low , folate 12.5-normal -B12 supplement IM x 1 given and daily oral B12 1000 mcg ordered  -Hg trending down but no report of melena -hemoccult to stools ordered -possibly hemodilution  as cause of downtrend -BID PPI for GI protection    Acute kidney injury on CKD 3A --creatinine holding around 1.36 with a GFR around 30 Renally adjust medications, avoid nephrotoxic agents/dehydration/hypotension   Hypokalemia -- oral replacement given and repleted   Hypomagnesemia -- repleted    Elevated BNP BNP 187   No respiratory distress, hemodynamically stable, no signs of fluid overload   Continue total input/output, daily weights and fluid restriction Echocardiogram done on 09/21/2023 showed LVEF of 65 to 70%.   No RWMA.  LV diastolic parameters indeterminate.  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  Continue ostomy bag care   GERD Discussed with Dr. Shaaron, BID PPI recommended for GI protection    Compression fracture of L1 Continue Tylenol  as needed Patient states that she has an appointment for surgical repair soon  DVT prophylaxis: SCDs Code Status: Full  Family Communication:  Disposition: Home    Consultants:   Procedures:   Antimicrobials:    Subjective: Pt had a brief conversion overnight to aflutter and now back in SR and she has ambulated the hall a couple of times this morning and remained in SR and remained with controlled HR.  She is tolerating breakfast well and reports no headache, CP, palpitations or SOB.   Objective: Vitals:   02/26/24 0500 02/26/24 0600 02/26/24 0738 02/26/24 0828  BP:  (!) 137/45  (!) 148/34  Pulse: 77 (!) 104  73  Resp: (!) 21 13  18   Temp:   97.9 F (36.6 C)   TempSrc:   Oral   SpO2: 100% 100%  100%  Weight:      Height:        Intake/Output Summary (Last 24 hours) at 02/26/2024 1016 Last data filed at 02/26/2024 0800 Gross per 24 hour  Intake 1364.46 ml  Output 0 ml  Net 1364.46 ml   Filed Weights    02/24/24 0701 02/25/24 0617 02/26/24 0401  Weight: 75.4 kg 74.7 kg 75.9 kg   Examination:  General exam: Appears calm and comfortable  Respiratory system: Clear to auscultation. Respiratory effort normal. Cardiovascular system: normal S1 & S2 heard. No JVD, murmurs, rubs, gallops or clicks. No pedal edema. Gastrointestinal system: Abdomen is nondistended, soft and nontender. No organomegaly or masses felt. Normal bowel sounds heard. Central nervous system: Alert and oriented. No focal neurological deficits. Extremities: Symmetric 5 x 5 power. Skin: No rashes, lesions or ulcers. Psychiatry: Judgement and insight appear normal. Mood & affect appropriate.   Data Reviewed: I have personally reviewed following labs and imaging studies  CBC: Recent Labs  Lab 02/20/24 1904 02/21/24 0325 02/23/24 0310 02/24/24 0400 02/25/24 0422 02/26/24 0525  WBC 9.9 8.5 8.9 11.2* 9.6 8.5  NEUTROABS 6.9  --   --   --   --   --   HGB 11.4* 10.8* 10.5* 11.3* 9.5* 8.7*  HCT 34.5* 32.4* 32.1* 34.1* 29.0* 26.6*  MCV 103.0* 103.2* 102.6* 103.0* 103.6* 102.3*  PLT 169 146* 173 186 175 162    Basic Metabolic Panel: Recent Labs  Lab 02/21/24 0325 02/22/24  9044 02/23/24 0310 02/24/24 0400 02/25/24 0422 02/26/24 0525 02/26/24 0729  NA 137 134* 135 135 132* 132*  --   K 3.5 3.2* 4.0 4.4 3.7 4.1  --   CL 105 104 108 105 103 103  --   CO2 21* 21* 20* 22 23 24   --   GLUCOSE 104* 102* 96 98 108* 85  --   BUN 33* 25* 23 21 30* 25*  --   CREATININE 1.34* 1.31* 1.34* 1.29* 1.59* 1.36*  --   CALCIUM 8.5* 8.1* 8.2* 8.3* 8.1* 8.1*  --   MG 1.5* 2.1 1.9 2.1 1.8  --  2.1  PHOS 2.5  --   --   --   --   --   --     CBG: No results for input(s): GLUCAP in the last 168 hours.  Recent Results (from the past 240 hours)  Surgical pcr screen     Status: None   Collection Time: 02/17/24 11:12 AM   Specimen: Nasal Mucosa; Nasal Swab  Result Value Ref Range Status   MRSA, PCR NEGATIVE NEGATIVE Final    Staphylococcus aureus NEGATIVE NEGATIVE Final    Comment: (NOTE) The Xpert SA Assay (FDA approved for NASAL specimens in patients 59 years of age and older), is one component of a comprehensive surveillance program. It is not intended to diagnose infection nor to guide or monitor treatment. Performed at [facility], [address], [place], [place] [address]   MRSA Next Gen by PCR, Nasal     Status: None   Collection Time: 02/21/24  1:16 PM   Specimen: Nasal Mucosa; Nasal Swab  Result Value Ref Range Status   MRSA by PCR Next Gen NOT DETECTED NOT DETECTED Final    Comment: (NOTE) The GeneXpert MRSA Assay (FDA approved for NASAL specimens only), is one component of a comprehensive MRSA colonization surveillance program. It is not intended to diagnose MRSA infection nor to guide or monitor treatment for MRSA infections. Test performance is not FDA approved in patients less than 38 years old. Performed at [facility], [address] [place], [place] [address]      Radiology Studies: No results found.   Scheduled Meds:  amiodarone   200 mg Oral BID   Chlorhexidine  Gluconate Cloth  6 each Topical Q0600   vitamin B-12  1,000 mcg Oral Daily   diltiazem   60 mg Oral Q6H   irbesartan   150 mg Oral Daily   levothyroxine   100 mcg Oral QAC breakfast   magnesium  oxide  400 mg Oral Daily   multivitamin with minerals  1 tablet Oral Daily   pantoprazole   40 mg Oral BID   Ensure Max Protein  11 oz Oral Daily   Continuous Infusions:  heparin  700 Units/hr (02/26/24 0800)    LOS: 4 days   Critical Care Procedure Note Authorized and Performed by: KYM Louder MD  Total Critical Care time:  57 mins Due to a high probability of clinically significant, life threatening deterioration, the patient required my highest level of preparedness to intervene emergently and I personally spent this critical care time directly and personally managing the patient.  This critical care time included  obtaining a history; examining the patient, pulse oximetry; ordering and review of studies; arranging urgent treatment with development of a management plan; evaluation of patient's response of treatment; frequent reassessment; and discussions with other providers.  This critical care time was performed to assess and manage the high probability of imminent  and life threatening deterioration that could result in multi-organ failure.  It was exclusive of separately billable procedures and treating other patients and teaching time.    Afton Louder, MD How to contact the [facility] Attending or Consulting provider 7A - 7P or covering provider during after hours 7P -7A, for this patient?  Check the care team in [facility] and look for a) attending/consulting TR[facility] provider listed and b) the TR[facility] team listed Log into [email or web address] to find provider on call.  Locate the [facility] provider you are looking for under [facility] and page to a number that you can be directly reached. If you still have difficulty reaching the provider, please page the [facility] (Director on Call) for the Hospitalists listed on amion for assistance.  02/26/2024, 10:16 AM

## 2024-02-26 NOTE — Plan of Care (Signed)

## 2024-02-26 NOTE — Progress Notes (Addendum)
 PHARMACY - ANTICOAGULATION CONSULT NOTE  Pharmacy Consult for heparin  Indication: atrial fibrillation  Allergies  Allergen Reactions   Amlodipine Swelling   Clonidine Rash    Rash with patch only.  Okay to take pill    Patient Measurements: Height: 5' 3.5 (161.3 cm) Weight: 75.9 kg (167 lb 5.3 oz) IBW/kg (Calculated) : 53.55 HEPARIN  DW (KG): 68.7  Vital Signs: Temp: 97.9 F (36.6 C) (09/28 0738) Temp Source: Oral (09/28 0738) BP: 137/45 (09/28 0600) Pulse Rate: 104 (09/28 0600)  Labs: Recent Labs    02/24/24 0400 02/24/24 2000 02/25/24 0422 02/25/24 1113 02/25/24 1947 02/26/24 0525  HGB 11.3*  --  9.5*  --   --  8.7*  HCT 34.1*  --  29.0*  --   --  26.6*  PLT 186  --  175  --   --  162  HEPARINUNFRC  --    < > 0.58 0.92* 0.49 0.36  CREATININE 1.29*  --  1.59*  --   --  1.36*   < > = values in this interval not displayed.    Estimated Creatinine Clearance: 33.6 mL/min (A) (by C-G formula based on SCr of 1.36 mg/dL (H)).   Medical History: Past Medical History:  Diagnosis Date   Anemia    Atrial fibrillation (HCC)    Chronic back pain    Chronic kidney disease    Diverticulosis    History of fall    HTN (hypertension)    Hypothyroidism    Osteoporosis     Assessment: 78 year old female with history of afib. Taken off anticoagulation in May and then may have been lost to follow up and never restarted on anticoagulation. New orders to start heparin  with plans to resume DOAC closer to discharge. Hemoglobin stable in 11s.   HL 0.36- therapeutic  Hgb 8.7- monitor  Goal of Therapy:  Heparin  level 0.3-0.7 units/ml Monitor platelets by anticoagulation protocol: Yes   Plan:  - Continue Heparin  infusion at 700 units/hr Daily heparin  levels and CBC  Elspeth Sour, PharmD Clinical Pharmacist 02/26/2024 7:50 AM

## 2024-02-26 NOTE — Plan of Care (Signed)
 This patient remains on AP-CCU as of time of writing. The patient remains on an active infusion of heparin . PICC to RIJ. Up to [facility] with stand-by assist only.  Edit: 02/27/24, 0108 hours: Secure chat sent to Dr. Noralee: Patient is requesting different/additional PRN med for pain (muscular/shoulder, and back/L1 fx). Says she taken tramadol  in the past and tolerated it okay, if it may be an option? Heat + APAP have not been effective so far. Thanks. See new orders from 0113 hours.  Problem: Education: Goal: Knowledge of General Education information will improve Description: Including pain rating scale, medication(s)/side effects and non-pharmacologic comfort measures Outcome: Progressing   Problem: Health Behavior/Discharge Planning: Goal: Ability to manage health-related needs will improve Outcome: Progressing   Problem: Clinical Measurements: Goal: Ability to maintain clinical measurements within normal limits will improve Outcome: Progressing Goal: Will remain free from infection Outcome: Progressing Goal: Diagnostic test results will improve Outcome: Progressing Goal: Respiratory complications will improve Outcome: Progressing Goal: Cardiovascular complication will be avoided Outcome: Progressing   Problem: Activity: Goal: Risk for activity intolerance will decrease Outcome: Progressing   Problem: Nutrition: Goal: Adequate nutrition will be maintained Outcome: Progressing   Problem: Coping: Goal: Level of anxiety will decrease Outcome: Progressing   Problem: Elimination: Goal: Will not experience complications related to bowel motility Outcome: Progressing Goal: Will not experience complications related to urinary retention Outcome: Progressing   Problem: Pain Managment: Goal: General experience of comfort will improve and/or be controlled Outcome: Progressing   Problem: Safety: Goal: Ability to remain free from injury will improve Outcome: Progressing   Problem:  Skin Integrity: Goal: Risk for impaired skin integrity will decrease Outcome: Progressing   Problem: Education: Goal: Knowledge of disease or condition will improve Outcome: Progressing Goal: Understanding of medication regimen will improve Outcome: Progressing Goal: Individualized Educational Video(s) Outcome: Progressing   Problem: Activity: Goal: Ability to tolerate increased activity will improve Outcome: Progressing   Problem: Cardiac: Goal: Ability to achieve and maintain adequate cardiopulmonary perfusion will improve Outcome: Progressing   Problem: Health Behavior/Discharge Planning: Goal: Ability to safely manage health-related needs after discharge will improve Outcome: Progressing   Problem: Education: Goal: Knowledge of disease and its progression will improve Outcome: Progressing Goal: Individualized Educational Video(s) Outcome: Progressing   Problem: Fluid Volume: Goal: Compliance with measures to maintain balanced fluid volume will improve Outcome: Progressing   Problem: Health Behavior/Discharge Planning: Goal: Ability to manage health-related needs will improve Outcome: Progressing   Problem: Nutritional: Goal: Ability to make healthy dietary choices will improve Outcome: Progressing   Problem: Clinical Measurements: Goal: Complications related to the disease process, condition or treatment will be avoided or minimized Outcome: Progressing

## 2024-02-27 ENCOUNTER — Other Ambulatory Visit: Payer: Self-pay

## 2024-02-27 DIAGNOSIS — I4892 Unspecified atrial flutter: Secondary | ICD-10-CM | POA: Diagnosis not present

## 2024-02-27 DIAGNOSIS — E876 Hypokalemia: Secondary | ICD-10-CM | POA: Diagnosis not present

## 2024-02-27 DIAGNOSIS — N179 Acute kidney failure, unspecified: Secondary | ICD-10-CM | POA: Diagnosis not present

## 2024-02-27 DIAGNOSIS — D539 Nutritional anemia, unspecified: Secondary | ICD-10-CM | POA: Diagnosis not present

## 2024-02-27 DIAGNOSIS — I480 Paroxysmal atrial fibrillation: Secondary | ICD-10-CM

## 2024-02-27 DIAGNOSIS — R578 Other shock: Secondary | ICD-10-CM | POA: Diagnosis not present

## 2024-02-27 DIAGNOSIS — D649 Anemia, unspecified: Secondary | ICD-10-CM

## 2024-02-27 LAB — BASIC METABOLIC PANEL WITH GFR
Anion gap: 6 (ref 5–15)
BUN: 25 mg/dL — ABNORMAL HIGH (ref 8–23)
CO2: 24 mmol/L (ref 22–32)
Calcium: 8.3 mg/dL — ABNORMAL LOW (ref 8.9–10.3)
Chloride: 105 mmol/L (ref 98–111)
Creatinine, Ser: 1.26 mg/dL — ABNORMAL HIGH (ref 0.44–1.00)
GFR, Estimated: 44 mL/min — ABNORMAL LOW (ref >60)
Glucose, Bld: 86 mg/dL (ref 70–99)
Potassium: 3.9 mmol/L (ref 3.5–5.1)
Sodium: 135 mmol/L (ref 135–145)

## 2024-02-27 LAB — CBC
HCT: 25.7 % — ABNORMAL LOW (ref 36.0–46.0)
HCT: 27.5 % — ABNORMAL LOW (ref 36.0–46.0)
Hemoglobin: 8.5 g/dL — ABNORMAL LOW (ref 12.0–15.0)
Hemoglobin: 9.1 g/dL — ABNORMAL LOW (ref 12.0–15.0)
MCH: 33.7 pg (ref 26.0–34.0)
MCH: 34.3 pg — ABNORMAL HIGH (ref 26.0–34.0)
MCHC: 33.1 g/dL (ref 30.0–36.0)
MCHC: 33.1 g/dL (ref 30.0–36.0)
MCV: 102 fL — ABNORMAL HIGH (ref 80.0–100.0)
MCV: 103.8 fL — ABNORMAL HIGH (ref 80.0–100.0)
Platelets: 177 K/uL (ref 150–400)
Platelets: 196 K/uL (ref 150–400)
RBC: 2.52 MIL/uL — ABNORMAL LOW (ref 3.87–5.11)
RBC: 2.65 MIL/uL — ABNORMAL LOW (ref 3.87–5.11)
RDW: 13.7 % (ref 11.5–15.5)
RDW: 13.9 % (ref 11.5–15.5)
WBC: 6.8 K/uL (ref 4.0–10.5)
WBC: 5.7 K/uL (ref 4.0–10.5)
nRBC: 0 % (ref 0.0–0.2)
nRBC: 0 % (ref 0.0–0.2)

## 2024-02-27 LAB — MAGNESIUM: Magnesium: 1.8 mg/dL (ref 1.7–2.4)

## 2024-02-27 LAB — OCCULT BLOOD X 1 CARD TO LAB, STOOL: Fecal Occult Bld: NEGATIVE

## 2024-02-27 LAB — HEPARIN LEVEL (UNFRACTIONATED): Heparin Unfractionated: 0.31 [IU]/mL (ref 0.30–0.70)

## 2024-02-27 MED ORDER — MAGNESIUM SULFATE 2 GM/50ML IV SOLN
2.0000 g | Freq: Once | INTRAVENOUS | Status: AC
  Administered 2024-02-27: 2 g via INTRAVENOUS
  Filled 2024-02-27: qty 50

## 2024-02-27 MED ORDER — TRAMADOL HCL 50 MG PO TABS
50.0000 mg | ORAL_TABLET | Freq: Four times a day (QID) | ORAL | Status: DC | PRN
  Administered 2024-02-27 – 2024-03-01 (×4): 50 mg via ORAL
  Filled 2024-02-27 (×4): qty 1

## 2024-02-27 MED ORDER — ORAL CARE MOUTH RINSE: 15.0000 mL | OROMUCOSAL | Status: DC | PRN

## 2024-02-27 MED ORDER — DILTIAZEM HCL ER COATED BEADS 240 MG PO CP24
240.0000 mg | ORAL_CAPSULE | Freq: Every day | ORAL | Status: DC
  Filled 2024-02-27: qty 1

## 2024-02-27 NOTE — Progress Notes (Signed)
 02/27/2024 5:53 PM  Repeated CBC reviewed, Hg up to 9.1  CBC    Component Value Date/Time   WBC 5.7 02/27/2024 1702   RBC 2.65 (L) 02/27/2024 1702   HGB 9.1 (L) 02/27/2024 1702   HCT 27.5 (L) 02/27/2024 1702   PLT 196 02/27/2024 1702   MCV 103.8 (H) 02/27/2024 1702   MCH 34.3 (H) 02/27/2024 1702   MCHC 33.1 02/27/2024 1702   RDW 13.9 02/27/2024 1702   LYMPHSABS 2.0 02/20/2024 1904   MONOABS 0.9 02/20/2024 1904   EOSABS 0.0 02/20/2024 1904   BASOSABS 0.0 02/20/2024 1904   C. FREDRIK Louder, MD  How to contact the [facility] Attending or Consulting provider 7A - 7P or covering provider during after hours 7P -7A, for this patient?  Check the care team in [facility] and look for a) attending/consulting TR[facility] provider listed and b) the TR[facility] team listed Log into [email or web address] and use [facility] universal password to access. If you do not have the password, please contact the hospital operator. Locate the [facility] provider you are looking for under [facility] and page to a number that you can be directly reached. If you still have difficulty reaching the provider, please page the [facility] (Director on Call) for the Hospitalists listed on amion for assistance.

## 2024-02-27 NOTE — Progress Notes (Signed)
 PROGRESS NOTE   Felicia Frank  FMW:[id number] DOB: 08-28-1945 DOA: 02/20/2024 PCP: Dino Devere RAMAN, MD   Chief Complaint  Patient presents with   Tachycardia   Level of care: Stepdown  Brief Admission History:  Felicia Frank is a 78 y.o. female with medical history significant of HTN, hypothyroidism, Atrial fibrilation, CKD 3A who presents to the emergency department due to sensation of chest tightness and bilateral arm heaviness which started yesterday in the morning, this was associated with low energy.  She checked her heart rate at home and it ranged within 145 to 160 bpm.  This improved on waking up this morning to 80, but this quickly increased again.  Apparently, patient has not been taking her beta-blocker due to low blood pressure. Patient was not on any anticoagulant at this time due to GI bleed and anemia that was stopped when she was initially placed on Eliquis .   ED Course:  Patient was tachypneic, tachycardic, but other vital signs were within normal range.  Workup in the ED showed macrocytic anemia, BMP shows sodium 135, potassium 3.4, chloride 104, bicarb 18, blood glucose 116, BUN/creatinine 36/1.51 (baseline creatinine 1.0-1.1).  Troponin 43 > 52.  BNP 187. Chest x-ray showed no active disease IV Cardizem  10 mg x 1 was given, patient was started on Cardizem  drip.  [facility] was asked to admit patient  Assessment and Plan:  Paroxysmal atrial flutter with RVR - converted to SR  Patient was treated with IV amiodarone  infusion and now transitioned to oral amiodarone  200 mg BID She stopped taking her beta-blocker due to low blood pressures -Cardiology consulted, appreciate further evaluation recommendations -avapro  150 mg daily started by cardiology -holding home carvedilol  for now  -after discussion with cardiology, reached out to Dr Shaaron and Dr. Kallie about potential rechallenge pt for full anticoagulation and after discussions they both recommend that it is  reasonable to restart full anticoagulation now with careful monitoring. Dr Alvan discussed with patient and she is agreeable.  We have started IV heparin  infusion with pharm D and will monitor Hg with plan to transition to apixaban  closer to discharge if she tolerates.   -if she does not convert over weekend will plan for DCCV on Mon and would make NPO Sunday evening  -if she converts to SR over weekend, discussed with Dr. Alvan would send home on oral amiodarone  200 mg BID  -pt converted to SR in last 24 hours but went back into aflutter RVR, another bolus amio given and now HR is slowing down and hopefully will convert back to SR, continue IV amio and scrap plans to transition to oral amiodarone  today -pt started on IV heparin  9/26 after discussions noted and Hg down today to 9.5 but I think this may be hemodilution from 2 current continuous IV infusions, pt reports no black or blood stools but we will continue to follow this closely. Continue BID PPI for GI protection.  -trial of coming off IV amio again today to oral amiodarone  200 mg BID -she remains on IV heparin  infusion and closely monitoring Hg with plans to transition to DOAC close to DC -with elevated BPs, increased diltiazem  to 60 mg every 6 hours    9/29: remains in SR, rate controlled, no melenotic stools on IV heparin , hg stable at 8.5  Hypokalemia/hypomagnesia Maintaining serum K > 4.0, magnesium >2.0 Repleted K (4.1) and magnesium  (2.1)   Macrocytic anemia/B12 deficiency MCV 103.0; B12< 150 low , folate 12.5-normal -B12 supplement IM x 1 given and daily  oral B12 1000 mcg ordered  -Hg trending down but no report of melena -hemoccult to stools ordered and negative x 2  -possibly hemodilution as cause of downtrend -BID PPI for GI protection -Hg holding stable at 8    Acute kidney injury on CKD 3A --creatinine holding around 1.36 with a GFR around 30 Renally adjust medications, avoid nephrotoxic  agents/dehydration/hypotension   Hypokalemia -- oral replacement given and repleted   Hypomagnesemia -- repleted    Elevated BNP BNP 187  No respiratory distress, hemodynamically stable, no signs of fluid overload   Continue total input/output, daily weights and fluid restriction Echocardiogram done on 09/21/2023 showed LVEF of 65 to 70%.   No RWMA.  LV diastolic parameters indeterminate.  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  Continue ostomy bag care   GERD Discussed with Dr. Shaaron, BID PPI recommended for GI protection    Compression fracture of L1 Continue Tylenol  as needed Patient states that she has an appointment for surgical repair soon  DVT prophylaxis: SCDs Code Status: Full  Family Communication:  Disposition: Home    Consultants:  cardiology  Procedures:   Antimicrobials:    Subjective: Pt says she has been up and ambulating since being off the IV amio and she has not converted to Aflutter so far or had symptoms of palpitations or SOB.    Objective: Vitals:   02/27/24 0728 02/27/24 0744 02/27/24 0800 02/27/24 0900  BP:  (!) 139/47 (!) 149/47 (!) 113/32  Pulse:   70 64  Resp:   (!) 23 19  Temp: 98 F (36.7 C)     TempSrc: Oral     SpO2:   100% 96%  Weight:      Height:        Intake/Output Summary (Last 24 hours) at 02/27/2024 1003 Last data filed at 02/27/2024 0952 Gross per 24 hour  Intake 703.82 ml  Output 200 ml   Net 503.82 ml   Filed Weights   02/25/24 0617 02/26/24 0401 02/27/24 0400  Weight: 74.7 kg 75.9 kg 75.6 kg   Examination:  General exam: Appears calm and comfortable  Respiratory system: Clear to auscultation. Respiratory effort normal. Cardiovascular system: normal S1 & S2 heard. No JVD, murmurs, rubs, gallops or clicks. No pedal edema. Gastrointestinal system: Abdomen is nondistended, soft and nontender. No organomegaly or masses felt. Normal bowel sounds heard. Central nervous system: Alert and oriented. No focal neurological deficits. Extremities: Symmetric 5 x 5 power. Skin: No rashes, lesions or ulcers. Psychiatry: Judgement and insight appear normal. Mood & affect appropriate.   Data Reviewed: I have personally reviewed following labs and imaging studies  CBC: Recent Labs  Lab 02/20/24 1904 02/21/24 0325 02/23/24 0310 02/24/24 0400 02/25/24 0422 02/26/24 0525 02/27/24 0420  WBC 9.9   < > 8.9 11.2* 9.6 8.5 6.8  NEUTROABS 6.9  --   --   --   --   --   --   HGB 11.4*   < > 10.5* 11.3* 9.5* 8.7* 8.5*  HCT 34.5*   < > 32.1* 34.1* 29.0*  26.6* 25.7*  MCV 103.0*   < > 102.6* 103.0* 103.6* 102.3* 102.0*  PLT 169   < > 173 186 175 162 177   < > = values in this interval not displayed.    Basic Metabolic Panel: Recent Labs  Lab 02/21/24 0325 02/22/24 0955 02/23/24 0310 02/24/24 0400 02/25/24 0422 02/26/24 0525 02/26/24 0729 02/27/24 0420  NA 137   < > 135 135 132* 132*  --  135  K 3.5   < > 4.0 4.4 3.7 4.1  --  3.9  CL 105   < > 108 105 103 103  --  105  CO2 21*   < > 20* 22 23 24   --  24  GLUCOSE 104*   < > 96 98 108* 85  --  86  BUN 33*   < > 23 21 30* 25*  --  25*  CREATININE 1.34*   < > 1.34* 1.29* 1.59* 1.36*  --  1.26*  CALCIUM 8.5*   < > 8.2* 8.3* 8.1* 8.1*  --  8.3*  MG 1.5*   < > 1.9 2.1 1.8  --  2.1 1.8  PHOS 2.5  --   --   --   --   --   --   --    < > = values in this interval not displayed.    CBG: No results for input(s): GLUCAP in the last  168 hours.  Recent Results (from the past 240 hours)  Surgical pcr screen     Status: None   Collection Time: 02/17/24 11:12 AM   Specimen: Nasal Mucosa; Nasal Swab  Result Value Ref Range Status   MRSA, PCR NEGATIVE NEGATIVE Final   Staphylococcus aureus NEGATIVE NEGATIVE Final    Comment: (NOTE) The Xpert SA Assay (FDA approved for NASAL specimens in patients 11 years of age and older), is one component of a comprehensive surveillance program. It is not intended to diagnose infection nor to guide or monitor treatment. Performed at [facility], [address], [place], [place] [address]   MRSA Next Gen by PCR, Nasal     Status: None   Collection Time: 02/21/24  1:16 PM   Specimen: Nasal Mucosa; Nasal Swab  Result Value Ref Range Status   MRSA by PCR Next Gen NOT DETECTED NOT DETECTED Final    Comment: (NOTE) The GeneXpert MRSA Assay (FDA approved for NASAL specimens only), is one component of a comprehensive MRSA colonization surveillance program. It is not intended to diagnose MRSA infection nor to guide or monitor treatment for MRSA infections. Test performance is not FDA approved in patients less than 46 years old. Performed at [facility], [address] [place], [place] [address]      Radiology Studies: No results found.   Scheduled Meds:  amiodarone   200 mg Oral BID   Chlorhexidine  Gluconate Cloth  6 each Topical Q0600   vitamin B-12  1,000 mcg Oral Daily   diltiazem   60 mg Oral Q6H   docusate sodium   100 mg Oral QPM   irbesartan   150 mg Oral Daily   levothyroxine   100 mcg Oral QAC breakfast   magnesium  oxide  400 mg Oral Daily   multivitamin with minerals  1 tablet Oral Daily   pantoprazole   40 mg Oral BID   Ensure Max Protein  11 oz Oral Daily   Continuous Infusions:  heparin  750 Units/hr (02/27/24 0952)    LOS: 5 days   Time spent:  55 mins   Luvern Mischke Vicci, MD How to contact the [facility] Attending or Consulting provider 7A - 7P or covering  provider during after hours 7P -7A, for this patient?  Check the care team in [facility] and look for a) attending/consulting [facility] provider listed and b) the [facility] team listed Log into [email or web address] to find provider on call.  Locate the [facility] provider you are looking for under [facility] and page to a number that you can be directly reached. If you still have difficulty reaching the provider, please page the [facility] (Director on Call) for the Hospitalists listed on amion for assistance.  02/27/2024, 10:03 AM

## 2024-02-27 NOTE — Plan of Care (Signed)
  Problem: Education: Goal: Knowledge of General Education information will improve Description: Including pain rating scale, medication(s)/side effects and non-pharmacologic comfort measures Outcome: Progressing   Problem: Nutrition: Goal: Adequate nutrition will be maintained Outcome: Progressing   Problem: Elimination: Goal: Will not experience complications related to bowel motility Outcome: Progressing Goal: Will not experience complications related to urinary retention Outcome: Progressing   Problem: Pain Managment: Goal: General experience of comfort will improve and/or be controlled Outcome: Progressing

## 2024-02-27 NOTE — Progress Notes (Signed)
 PHARMACY - ANTICOAGULATION CONSULT NOTE  Pharmacy Consult for heparin  Indication: atrial fibrillation  Allergies  Allergen Reactions   Amlodipine Swelling   Clonidine Rash    Rash with patch only.  Okay to take pill    Patient Measurements: Height: 5' 3.5 (161.3 cm) Weight: 75.6 kg (166 lb 10.7 oz) IBW/kg (Calculated) : 53.55 HEPARIN  DW (KG): 68.7  Vital Signs: Temp: 98 F (36.7 C) (09/29 0728) Temp Source: Oral (09/29 0728) BP: 139/47 (09/29 0744) Pulse Rate: 62 (09/29 0600)  Labs: Recent Labs    02/25/24 0422 02/25/24 1113 02/25/24 1947 02/26/24 0525 02/27/24 0420 02/27/24 0808  HGB 9.5*  --   --  8.7* 8.5*  --   HCT 29.0*  --   --  26.6* 25.7*  --   PLT 175  --   --  162 177  --   HEPARINUNFRC 0.58   < > 0.49 0.36  --  0.31  CREATININE 1.59*  --   --  1.36* 1.26*  --    < > = values in this interval not displayed.    Estimated Creatinine Clearance: 36.2 mL/min (A) (by C-G formula based on SCr of 1.26 mg/dL (H)).   Medical History: Past Medical History:  Diagnosis Date   Anemia    Atrial fibrillation (HCC)    Chronic back pain    Chronic kidney disease    Diverticulosis    History of fall    HTN (hypertension)    Hypothyroidism    Osteoporosis     Assessment: 78 year old female with history of afib. Taken off anticoagulation in May and then may have been lost to follow up and never restarted on anticoagulation. New orders to start heparin  with plans to resume DOAC closer to discharge.   HL 0.31- therapeutic  Hgb 8.5- monitor  Goal of Therapy:  Heparin  level 0.3-0.7 units/ml Monitor platelets by anticoagulation protocol: Yes   Plan:  Increase Heparin  infusion to 750 units/hr to keep within range Daily heparin  levels and CBC  Dempsey Blush PharmD., BCPS Clinical Pharmacist 02/27/2024 8:50 AM

## 2024-02-27 NOTE — Progress Notes (Addendum)
 Progress Note  Patient Name: Felicia Frank Date of Encounter: 02/27/2024  Primary Cardiologist: [name], MD  Subjective   No complaints.  Telemetry reviewed, in NSR.  Heparin  drip started on 02/24/2024.  Hemoglobin gradually dropped from 11-8.5 this a.m.  No visible bleeding in the colostomy bag.  Inpatient Medications    Scheduled Meds:  amiodarone   200 mg Oral BID   Chlorhexidine  Gluconate Cloth  6 each Topical Q0600   vitamin B-12  1,000 mcg Oral Daily   diltiazem   60 mg Oral Q6H   docusate sodium   100 mg Oral QPM   irbesartan   150 mg Oral Daily   levothyroxine   100 mcg Oral QAC breakfast   magnesium  oxide  400 mg Oral Daily   multivitamin with minerals  1 tablet Oral Daily   pantoprazole   40 mg Oral BID   Ensure Max Protein  11 oz Oral Daily   Continuous Infusions:  heparin  750 Units/hr (02/27/24 1100)   PRN Meds: acetaminophen  **OR** acetaminophen , labetalol , ondansetron  **OR** ondansetron  (ZOFRAN ) IV, mouth rinse, prochlorperazine , traMADol , zolpidem    Vital Signs    Vitals:   02/27/24 0900 02/27/24 1000 02/27/24 1100 02/27/24 1134  BP: (!) 113/32 (!) 117/36  (!) 138/47  Pulse: 64 (!) 59 68 64  Resp: 19 16 (!) 22   Temp:    97.9 F (36.6 C)  TempSrc:    Oral  SpO2: 96% 96% 97% 97%  Weight:      Height:        Intake/Output Summary (Last 24 hours) at 02/27/2024 1215 Last data filed at 02/27/2024 1100 Gross per 24 hour  Intake 690.62 ml  Output 250 ml  Net 440.62 ml   Filed Weights   02/25/24 0617 02/26/24 0401 02/27/24 0400  Weight: 74.7 kg 75.9 kg 75.6 kg    Telemetry     Personally reviewed. In NSR  ECG    Not performed today.  Physical Exam   GEN: No acute distress.   Neck: No JVD. Cardiac: RRR, no murmur, rub, or gallop.  Respiratory: Nonlabored. Clear to auscultation bilaterally. GI: Soft, nontender, bowel sounds present. MS: No edema; No deformity. Neuro:  Nonfocal. Psych: Alert and oriented x 3. Normal  affect.  Labs    Chemistry Recent Labs  Lab 02/20/24 1904 02/21/24 0325 02/22/24 0955 02/25/24 0422 02/26/24 0525 02/27/24 0420  NA 135 137   < > 132* 132* 135  K 3.4* 3.5   < > 3.7 4.1 3.9  CL 104 105   < > 103 103 105  CO2 18* 21*   < > 23 24 24   GLUCOSE 116* 104*   < > 108* 85 86  BUN 36* 33*   < > 30* 25* 25*  CREATININE 1.51* 1.34*   < > 1.59* 1.36* 1.26*  CALCIUM 8.6* 8.5*   < > 8.1* 8.1* 8.3*  PROT 6.2* 5.7*  --   --   --   --   ALBUMIN  3.3* 2.9*  --   --   --   --   AST 22 20  --   --   --   --   ALT 39 34  --   --   --   --   ALKPHOS 75 68  --   --   --   --   BILITOT 0.6 0.8  --   --   --   --   GFRNONAA 35* 41*   < > 33* 40* 44*  ANIONGAP 13 11   < > 6 5 6    < > = values in this interval not displayed.     Hematology Recent Labs  Lab 02/25/24 0422 02/26/24 0525 02/27/24 0420  WBC 9.6 8.5 6.8  RBC 2.80* 2.60* 2.52*  HGB 9.5* 8.7* 8.5*  HCT 29.0* 26.6* 25.7*  MCV 103.6* 102.3* 102.0*  MCH 33.9 33.5 33.7  MCHC 32.8 32.7 33.1  RDW 13.7 13.6 13.7  PLT 175 162 177    Cardiac Enzymes Recent Labs  Lab 02/20/24 2057 02/20/24 2300 02/21/24 0044 02/21/24 0325 02/21/24 0517  TROPONINIHS 52* 85* 105* 118* 93*    BNP Recent Labs  Lab 02/20/24 1904  BNP 187.0*     DDimerNo results for input(s): DDIMER in the last 168 hours.   Radiology    No results found.  Assessment & Plan    Atrial flutter with RVR - New onset this admission.  On amiodarone  drip which was later switched to p.o. amiodarone  after adequate rate control.  However she did have intermittent atrial flutter with RVR episode throughout this admission.  Challenging rate control despite amiodarone  drip.  Thankfully, heart rates are controlled and she is in normal sinus rhythm now.  Continue amiodarone  200 mg twice daily, switch diltiazem  60 mg every 6 hours to diltiazem  180 mg once daily. - She had new onset of atrial fibrillation with RVR in April 2025, underwent Hartman's procedure  and was not started on systemic AC due to hemorrhagic shock requiring pressors and PRBC transfusion.  She was started on heparin  drip on 02/24/2024.  Her hemoglobin has been gradually dropping from 11 (on 02/24/24) to 8.5 this a.m. no signs of active bleeding.  Okay to continue heparin  drip for now. - Repeat CBC in the p.m.  If hemoglobin drops to less than 8, hold heparin  drip and transfuse 1 unit PRBC. Consider GI consultation. - She will benefit from outpatient watchman implantation.  Will place referral.  History of hemorrhagic shock - Patient had history of hemorrhagic shock in May 2025 requiring pressors and 8 units PRBC transfusion.  She was not on systemic AC until was restarted back on 02/24/2024 due to intermittent atrial flutter with RVR. EGD in May 2025 showed nonbleeding gastric ulcer that was thought to be secondary to NG tube insertion. - Repeat CBC in the p.m.  If hemoglobin drops to less than 8, hold heparin  drip and transfuse 1 unit PRBC. Consider GI consultation.  CRITICAL CARE Performed by: Diannah SQUIBB Karthikeya Funke   Total critical care time: 40 minutes  Critical care time was exclusive of separately billable procedures and treating other patients.  Critical care was necessary to treat or prevent imminent or life-threatening deterioration.  Critical care was time spent personally by me on the following activities: development of treatment plan with patient and/or surrogate as well as nursing, discussions with consultants, evaluation of patient's response to treatment, examination of patient, obtaining history from patient or surrogate, ordering and performing treatments and interventions, ordering and review of laboratory studies, ordering and review of radiographic studies, pulse oximetry and re-evaluation of patient's condition.   Signed, Diannah SQUIBB [place], MD  02/27/2024, 12:15 PM

## 2024-02-28 DIAGNOSIS — Z789 Other specified health status: Secondary | ICD-10-CM | POA: Diagnosis not present

## 2024-02-28 DIAGNOSIS — R7989 Other specified abnormal findings of blood chemistry: Secondary | ICD-10-CM | POA: Diagnosis not present

## 2024-02-28 DIAGNOSIS — I4892 Unspecified atrial flutter: Secondary | ICD-10-CM | POA: Diagnosis not present

## 2024-02-28 DIAGNOSIS — D649 Anemia, unspecified: Secondary | ICD-10-CM

## 2024-02-28 DIAGNOSIS — E039 Hypothyroidism, unspecified: Secondary | ICD-10-CM | POA: Diagnosis not present

## 2024-02-28 DIAGNOSIS — I10 Essential (primary) hypertension: Secondary | ICD-10-CM | POA: Diagnosis not present

## 2024-02-28 LAB — CBC
HCT: 25.3 % — ABNORMAL LOW (ref 36.0–46.0)
Hemoglobin: 8.3 g/dL — ABNORMAL LOW (ref 12.0–15.0)
MCH: 34.2 pg — ABNORMAL HIGH (ref 26.0–34.0)
MCHC: 32.8 g/dL (ref 30.0–36.0)
MCV: 104.1 fL — ABNORMAL HIGH (ref 80.0–100.0)
Platelets: 190 K/uL (ref 150–400)
RBC: 2.43 MIL/uL — ABNORMAL LOW (ref 3.87–5.11)
RDW: 14.1 % (ref 11.5–15.5)
WBC: 5.7 K/uL (ref 4.0–10.5)
nRBC: 0 % (ref 0.0–0.2)

## 2024-02-28 LAB — BASIC METABOLIC PANEL WITH GFR
Anion gap: 7 (ref 5–15)
BUN: 25 mg/dL — ABNORMAL HIGH (ref 8–23)
CO2: 23 mmol/L (ref 22–32)
Calcium: 8.4 mg/dL — ABNORMAL LOW (ref 8.9–10.3)
Chloride: 105 mmol/L (ref 98–111)
Creatinine, Ser: 1.45 mg/dL — ABNORMAL HIGH (ref 0.44–1.00)
GFR, Estimated: 37 mL/min — ABNORMAL LOW (ref >60)
Glucose, Bld: 85 mg/dL (ref 70–99)
Potassium: 4.6 mmol/L (ref 3.5–5.1)
Sodium: 135 mmol/L (ref 135–145)

## 2024-02-28 LAB — HEPARIN LEVEL (UNFRACTIONATED): Heparin Unfractionated: 0.36 [IU]/mL (ref 0.30–0.70)

## 2024-02-28 LAB — MAGNESIUM: Magnesium: 2.2 mg/dL (ref 1.7–2.4)

## 2024-02-28 MED ORDER — POLYETHYLENE GLYCOL 3350 17 G PO PACK
17.0000 g | PACK | ORAL | Status: AC
  Administered 2024-02-28 (×5): 17 g via ORAL
  Filled 2024-02-28 (×3): qty 1

## 2024-02-28 MED ORDER — DILTIAZEM HCL ER COATED BEADS 180 MG PO CP24
180.0000 mg | ORAL_CAPSULE | Freq: Every day | ORAL | Status: DC
Start: 2024-02-28 — End: 2024-03-02
  Administered 2024-02-28 – 2024-03-02 (×4): 180 mg via ORAL
  Filled 2024-02-28 (×4): qty 1

## 2024-02-28 NOTE — Progress Notes (Signed)
 Progress Note  Patient Name: Felicia Frank Date of Encounter: 02/28/2024  Primary Cardiologist: [name], MD  Subjective   No complaints.  Hemoglobin fluctuating.  Inpatient Medications    Scheduled Meds:  amiodarone   200 mg Oral BID   Chlorhexidine  Gluconate Cloth  6 each Topical Q0600   vitamin B-12  1,000 mcg Oral Daily   diltiazem   180 mg Oral Daily   docusate sodium   100 mg Oral QPM   irbesartan   150 mg Oral Daily   levothyroxine   100 mcg Oral QAC breakfast   magnesium  oxide  400 mg Oral Daily   multivitamin with minerals  1 tablet Oral Daily   pantoprazole   40 mg Oral BID   Ensure Max Protein  11 oz Oral Daily   Continuous Infusions:  heparin  750 Units/hr (02/27/24 1100)   PRN Meds: acetaminophen  **OR** acetaminophen , labetalol , ondansetron  **OR** ondansetron  (ZOFRAN ) IV, mouth rinse, prochlorperazine , traMADol , zolpidem    Vital Signs    Vitals:   02/27/24 2300 02/28/24 0121 02/28/24 0436 02/28/24 0511  BP: (!) 123/36 (!) 133/44 (!) 124/43   Pulse: 80 79 76   Resp:  17    Temp: 98.2 F (36.8 C)  98.1 F (36.7 C)   TempSrc: Oral  Oral   SpO2: 95%  96%   Weight:    74.9 kg  Height:        Intake/Output Summary (Last 24 hours) at 02/28/2024 1117 Last data filed at 02/28/2024 0853 Gross per 24 hour  Intake 120 ml  Output --  Net 120 ml   Filed Weights   02/26/24 0401 02/27/24 0400 02/28/24 0511  Weight: 75.9 kg 75.6 kg 74.9 kg    Telemetry     Personally reviewed. In NSR  ECG    Not performed today.  Physical Exam   GEN: No acute distress.   Neck: No JVD. Cardiac: RRR, no murmur, rub, or gallop.  Respiratory: Nonlabored. Clear to auscultation bilaterally. GI: Soft, nontender, bowel sounds present. MS: No edema; No deformity. Neuro:  Nonfocal. Psych: Alert and oriented x 3. Normal affect.  Labs    Chemistry Recent Labs  Lab 02/26/24 0525 02/27/24 0420 02/28/24 0447  NA 132* 135 135  K 4.1 3.9 4.6  CL 103 105 105   CO2 24 24 23   GLUCOSE 85 86 85  BUN 25* 25* 25*  CREATININE 1.36* 1.26* 1.45*  CALCIUM 8.1* 8.3* 8.4*  GFRNONAA 40* 44* 37*  ANIONGAP 5 6 7      Hematology Recent Labs  Lab 02/27/24 0420 02/27/24 1702 02/28/24 0447  WBC 6.8 5.7 5.7  RBC 2.52* 2.65* 2.43*  HGB 8.5* 9.1* 8.3*  HCT 25.7* 27.5* 25.3*  MCV 102.0* 103.8* 104.1*  MCH 33.7 34.3* 34.2*  MCHC 33.1 33.1 32.8  RDW 13.7 13.9 14.1  PLT 177 196 190    Cardiac Enzymes Recent Labs  Lab 02/20/24 2057 02/20/24 2300 02/21/24 0044 02/21/24 0325 02/21/24 0517  TROPONINIHS 52* 85* 105* 118* 93*    BNP No results for input(s): BNP, PROBNP in the last 168 hours.    DDimerNo results for input(s): DDIMER in the last 168 hours.   Radiology    No results found.  Assessment & Plan    Atrial flutter with RVR - New onset this admission.  On amiodarone  drip which was later switched to p.o. amiodarone  after adequate rate control.  However she did have intermittent atrial flutter with RVR episode throughout this admission.  Challenging rate control despite amiodarone   drip.  Thankfully, heart rates are controlled and she is in normal sinus rhythm now. - She had new onset of atrial fibrillation with RVR in April 2025, underwent Hartman's procedure and was not started on systemic AC due to hemorrhagic shock requiring pressors and PRBC transfusion. - Rates now controlled and in NSR.  Continue amiodarone  200 mg twice daily, diltiazem  180 mg once daily.  Continue heparin  drip.  Her hemoglobin has been gradually dropping from 11 (on 02/24/24) to 8.3 this a.m. no signs of active bleeding. Okay to continue heparin  drip for now.  Consider formal GI consultation.  If GI recommends outpatient GI follow-up, okay to discharge her on DOAC provided she has cardiology follow-up visit and repeat CBC in 5 days on discharge. - She will benefit from outpatient watchman implantation.  Will place referral. - Echo from April 2025 showed normal  LVEF.  History of hemorrhagic shock - Patient had history of hemorrhagic shock in May 2025 requiring pressors and 8 units PRBC transfusion.  She was not on systemic AC until  02/24/2024 due to intermittent atrial flutter with RVR on this admission. EGD in May 2025 showed nonbleeding gastric ulcer that was thought to be secondary to NG tube insertion. - Hemoglobin fluctuating after starting heparin  drip.  Hemoglobin 8.3 this a.m., 9.1 yesterday p.m., 8.5 yesterday AM.  Currently on heparin  drip, low-dose. Consider formal GI consultation.  35 minutes spent in reviewing the prior notes, more than 3 labs, discussion of the above problems with the patient and her daughter, answering all her questions and documentation.  Complex decision making.  Signed, [name], MD  02/28/2024, 11:17 AM

## 2024-02-28 NOTE — Progress Notes (Signed)
 PROGRESS NOTE   Felicia Frank  FMW:[id number] DOB: Feb 24, 1946 DOA: 02/20/2024 PCP: Dino Devere RAMAN, MD   Chief Complaint  Patient presents with   Tachycardia   Level of care: Telemetry  Brief Admission History:  Felicia Frank is a 78 y.o. female with medical history significant of HTN, hypothyroidism, Atrial fibrilation, CKD 3A who presents to the emergency department due to sensation of chest tightness and bilateral arm heaviness which started yesterday in the morning, this was associated with low energy.  She checked her heart rate at home and it ranged within 145 to 160 bpm.  This improved on waking up this morning to 80, but this quickly increased again.  Apparently, patient has not been taking her beta-blocker due to low blood pressure. Patient was not on any anticoagulant at this time due to GI bleed and anemia that was stopped when she was initially placed on Eliquis .   ED Course:  Patient was tachypneic, tachycardic, but other vital signs were within normal range.  Workup in the ED showed macrocytic anemia, BMP shows sodium 135, potassium 3.4, chloride 104, bicarb 18, blood glucose 116, BUN/creatinine 36/1.51 (baseline creatinine 1.0-1.1).  Troponin 43 > 52.  BNP 187. Chest x-ray showed no active disease IV Cardizem  10 mg x 1 was given, patient was started on Cardizem  drip.  TRH was asked to admit patient  Assessment and Plan:  Paroxysmal atrial flutter with RVR - converted to SR  Patient was treated with IV amiodarone  infusion and now transitioned to oral amiodarone  200 mg BID She stopped taking her beta-blocker due to low blood pressures -Cardiology consulted, appreciate further evaluation recommendations -avapro  150 mg daily started by cardiology -holding home carvedilol  for now  -after discussion with cardiology, reached out to Dr Shaaron and Dr. Kallie about potential rechallenge pt for full anticoagulation and after discussions they both recommend that it is  reasonable to restart full anticoagulation now with careful monitoring. Dr Alvan discussed with patient and she is agreeable.  We have started IV heparin  infusion with pharm D and will monitor Hg with plan to transition to apixaban  closer to discharge if she tolerates.   -if she does not convert over weekend will plan for DCCV on Mon and would make NPO Sunday evening  -if she converts to SR over weekend, discussed with Dr. Alvan would send home on oral amiodarone  200 mg BID  -pt converted to SR in last 24 hours but went back into aflutter RVR, another bolus amio given and now HR is slowing down and hopefully will convert back to SR, continue IV amio and scrap plans to transition to oral amiodarone  today -pt started on IV heparin  9/26 after discussions noted and Hg down today to 9.5 but I think this may be hemodilution from 2 current continuous IV infusions, pt reports no black or blood stools but we will continue to follow this closely. Continue BID PPI for GI protection.  -trial of coming off IV amio again today to oral amiodarone  200 mg BID -she remains on IV heparin  infusion and closely monitoring Hg with plans to transition to DOAC close to DC -with elevated BPs, increased diltiazem  to 60 mg every 6 hours -- transitioned to oral diltiazem  CD 180 mg per cardio recs   9/29: remains in SR, rate controlled, no melenotic stools on IV heparin , hg stable at 8.5 9/30: Hg down to 8.3, GI consult requested, remains on IV heparin , stool hemoccults guaiac neg so far  Hypokalemia/hypomagnesia Maintaining serum K > 4.0,  magnesium >2.0 Repleted K (4.1) and magnesium  (2.1)   Macrocytic anemia/B12 deficiency MCV 103.0; B12< 150 low , folate 12.5-normal -B12 supplement IM x 1 given and daily oral B12 1000 mcg ordered  -Hg trending down but no report of melena -hemoccult to stools ordered and negative x 2  -possibly hemodilution as cause of downtrend -BID PPI for GI protection -Hg holding stable at 8     Acute kidney injury on CKD 3A --creatinine holding around 1.36 with a GFR around 30 Renally adjust medications, avoid nephrotoxic agents/dehydration/hypotension   Hypokalemia -- oral replacement given and repleted   Hypomagnesemia -- repleted to goal >2   Elevated BNP BNP 187  No respiratory distress, hemodynamically stable, no signs of fluid overload   Continue total input/output, daily weights and fluid restriction Echocardiogram done on 09/21/2023 showed LVEF of 65 to 70%.   No RWMA.  LV diastolic parameters indeterminate.  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  Continue ostomy bag care   GERD Discussed with Dr. Shaaron, BID PPI recommended for GI protection    Compression fracture of L1 Continue Tylenol  as needed Patient states that she has an appointment for surgical repair soon  DVT prophylaxis: SCDs Code Status: Full  Family Communication: daughter at bedside 9/30 Disposition: Home    Consultants:  cardiology  Procedures:   Antimicrobials:    Subjective: Pt denies melanotic stool, continues to ambulate well, no more SOB or palpitations  Objective: Vitals:   02/28/24 0121 02/28/24 0436 02/28/24 0511 02/28/24 1217  BP: (!) 133/44 (!) 124/43  (!) 118/51  Pulse: 79 76  83  Resp: 17   20  Temp:  98.1 F (36.7 C)  98.3 F (36.8 C)  TempSrc:  Oral  Oral  SpO2:  96%  100%  Weight:   74.9 kg   Height:        Intake/Output Summary (Last 24 hours) at 02/28/2024 1232 Last data  filed at 02/28/2024 0853 Gross per 24 hour  Intake 120 ml  Output --  Net 120 ml   Filed Weights   02/26/24 0401 02/27/24 0400 02/28/24 0511  Weight: 75.9 kg 75.6 kg 74.9 kg   Examination:  General exam: Appears calm and comfortable  Respiratory system: Clear to auscultation. Respiratory effort normal. Cardiovascular system: normal S1 & S2 heard. No JVD, murmurs, rubs, gallops or clicks. No pedal edema. Gastrointestinal system: Abdomen is nondistended, soft and nontender. No organomegaly or masses felt. Normal bowel sounds heard. Central nervous system: Alert and oriented. No focal neurological deficits. Extremities: Symmetric 5 x 5 power. Skin: No rashes, lesions or ulcers. Psychiatry: Judgement and insight appear normal. Mood & affect appropriate.   Data Reviewed: I have personally reviewed following labs and imaging studies  CBC: Recent Labs  Lab 02/25/24 0422 02/26/24 0525 02/27/24 0420 02/27/24 1702 02/28/24 0447  WBC 9.6 8.5 6.8 5.7 5.7  HGB 9.5* 8.7* 8.5* 9.1* 8.3*  HCT 29.0* 26.6* 25.7* 27.5* 25.3*  MCV 103.6* 102.3* 102.0* 103.8* 104.1*  PLT 175 162 177 196 190    Basic Metabolic Panel: Recent Labs  Lab 02/24/24 0400 02/25/24 0422 02/26/24 0525  02/26/24 0729 02/27/24 0420 02/28/24 0447  NA 135 132* 132*  --  135 135  K 4.4 3.7 4.1  --  3.9 4.6  CL 105 103 103  --  105 105  CO2 22 23 24   --  24 23  GLUCOSE 98 108* 85  --  86 85  BUN 21 30* 25*  --  25* 25*  CREATININE 1.29* 1.59* 1.36*  --  1.26* 1.45*  CALCIUM 8.3* 8.1* 8.1*  --  8.3* 8.4*  MG 2.1 1.8  --  2.1 1.8 2.2    CBG: No results for input(s): GLUCAP in the last 168 hours.  Recent Results (from the past 240 hours)  MRSA Next Gen by PCR, Nasal     Status: None   Collection Time: 02/21/24  1:16 PM   Specimen: Nasal Mucosa; Nasal Swab  Result Value Ref Range Status   MRSA by PCR Next Gen NOT DETECTED NOT DETECTED Final    Comment: (NOTE) The GeneXpert MRSA Assay (FDA approved for NASAL  specimens only), is one component of a comprehensive MRSA colonization surveillance program. It is not intended to diagnose MRSA infection nor to guide or monitor treatment for MRSA infections. Test performance is not FDA approved in patients less than 69 years old. Performed at [facility], [address] [place], [place] [address]      Radiology Studies: No results found.   Scheduled Meds:  amiodarone   200 mg Oral BID   Chlorhexidine  Gluconate Cloth  6 each Topical Q0600   vitamin B-12  1,000 mcg Oral Daily   diltiazem   180 mg Oral Daily   docusate sodium   100 mg Oral QPM   irbesartan   150 mg Oral Daily   levothyroxine   100 mcg Oral QAC breakfast   magnesium  oxide  400 mg Oral Daily   multivitamin with minerals  1 tablet Oral Daily   pantoprazole   40 mg Oral BID   Ensure Max Protein  11 oz Oral Daily   Continuous Infusions:  heparin  750 Units/hr (02/27/24 1100)    LOS: 6 days   Time spent: 55 mins   Kalasia Crafton Vicci, MD How to contact the [facility] Attending or Consulting provider 7A - 7P or covering provider during after hours 7P -7A, for this patient?  Check the care team in [facility] and look for a) attending/consulting TR[facility] provider listed and b) the [facility] team listed Log into [email or web address] to find provider on call.  Locate the [facility] provider you are looking for under [facility] and page to a number that you can be directly reached. If you still have difficulty reaching the provider, please page the [facility] (Director on Call) for the Hospitalists listed on amion for assistance.  02/28/2024, 12:32 PM

## 2024-02-28 NOTE — Consult Note (Signed)
 Gastroenterology Consult   Referring Provider: Dr. Vicci Primary Care Physician:  Dino Devere RAMAN, MD Primary Gastroenterologist:  Dr. Cindie  Patient ID: Felicia Frank; [id number]; Dec 19, 1945   Admit date: 02/20/2024  LOS: 6 days   Date of Consultation: 02/28/2024  Reason for Consultation:  Anemia on heparin , heme negative  History of Present Illness   Felicia Frank is a very pleasant 78 y.o. year old female with medical history of hypothyroidism, hypertension, chronic kidney disease stage IIIa, class I obesity, A fib, perforated diverticulitis with abscess s/p drain placement and enteral fistula formation s/p Harman's procedure with SBR and ileocecectomy May 2025, PUD diagnosed in May 2025 during hospitalization, presenting this admission with aflutter with RVR new onset this admission. Initially, she was on amiodarone  drip and then switched to oral route, started on heparin  drip on 9/26 due to past history and had decrease in Hgb from initially 11 to the 8 range without overt GI bleeding and was heme negative. Notably, she was not started on anticoagulation in May 2025 due to acute blood loss anemia and shock. Therefore, AC ws started this admission in light of presentation. Plans for outpatient watchman procedure. Cardiology recommending GI consultation in light of drifting Hgb and ensure no occult GI losses.  Today: Hgb 8.3. Her baseline Hgb appears to be in the 11 range. Heparin  drip started this admission with decline to 9.5 and then 8.5. Overall, Hgb has remained essentially unchanged in the mid 8 range for 48 hours. No overt GI bleeding. Heme negative. Suspected component of hemodilution.   She denies any prior overt GI bleeding. Has watery or soft stool from ostomy pouch depending on food choices. Notes pills pass through quickly. She has had decreased appetite since procedure in May 2025 and notes weight loss. No abdominal pain. No GERD symptoms. She is on famotidine  as  outpatient. No dysphagia. No N/V. Appears she is on an 81 mg aspirin  daily. No other NSAIDs or aspirin  powders. No PPI.    EGD May 2025: normal esophagus, non-bleeding gastric ulcer with clean ulcer base, normal duodenum.   Last colonoscopy in past by Dr. Donnel. No polyps per patient.    Past Medical History:  Diagnosis Date   Anemia    Atrial fibrillation (HCC)    Chronic back pain    Chronic kidney disease    Diverticulosis    History of fall    HTN (hypertension)    Hypothyroidism    Osteoporosis     Past Surgical History:  Procedure Laterality Date   ABDOMINAL HYSTERECTOMY     BACK SURGERY     CHOLECYSTECTOMY     COLECTOMY WITH COLOSTOMY CREATION/HARTMANN PROCEDURE N/A 10/13/2023   Procedure: PARTIAL COLECTOMY, WITH COLOSTOMY CREATION, PARTIAL SMALL BOWEL RESECTION, ILEOCECECTOMY;  Surgeon: Mavis Anes, MD;  Location: [facility]S;  Service: General;  Laterality: N/A;   ESOPHAGOGASTRODUODENOSCOPY N/A 10/21/2023   Procedure: EGD (ESOPHAGOGASTRODUODENOSCOPY);  Surgeon: Cindie Carlin POUR, DO;  Location: [facility] SUITE;  Service: Endoscopy;  Laterality: N/A;   TONSILLECTOMY      Prior to Admission medications   Medication Sig Start Date End Date Taking? Authorizing Provider  aspirin  EC 81 MG tablet Take 81 mg by mouth. 12/29/23  Yes [provider]  bumetanide  (BUMEX ) 1 MG tablet Take 0.5-1 mg by mouth daily as needed (fluid retention.). 01/09/24  Yes [provider]  carvedilol  (COREG ) 25 MG tablet Take 25 mg by mouth in the morning and at bedtime. 12/15/23  Yes [provider]  cloNIDine (CATAPRES) 0.1 MG tablet Take 0.1 mg by mouth 3 (three) times daily as needed (HIGH BLOOD PRESSURE (BP > 150)). 12/29/23  Yes [provider]  doxazosin  (CARDURA ) 8 MG tablet Take 8 mg by mouth at bedtime. 12/29/23  Yes [provider]  famotidine  (PEPCID ) 40 MG tablet Take 40 mg by mouth in the morning. 12/29/23  Yes [provider]  hydrALAZINE  (APRESOLINE) 25 MG tablet Take 25 mg by mouth in the morning and at bedtime. 02/09/24  Yes [provider]  levothyroxine  (SYNTHROID ) 100 MCG tablet Take 100 mcg by mouth daily before breakfast. 02/09/24  Yes [provider]  magnesium  oxide (MAG-OX) 400 MG tablet Take 400 mg by mouth daily. 11/22/23  Yes [provider]  spironolactone-hydrochlorothiazide (ALDACTAZIDE) 25-25 MG tablet Take 1 tablet by mouth as directed. Every other day as needed for Fluid   Yes [provider]  telmisartan (MICARDIS) 80 MG tablet Take 80 mg by mouth in the morning.   Yes [provider]  traMADol  (ULTRAM ) 50 MG tablet Take 50 mg by mouth every 12 (twelve) hours as needed (pain.).   Yes [provider]  zolpidem  (AMBIEN ) 10 MG tablet Take 10 mg by mouth at bedtime as needed.   Yes [provider]    Current Facility-Administered Medications  Medication Dose Route Frequency Provider Last Rate Last Admin   acetaminophen  (TYLENOL ) tablet 650 mg  650 mg Oral Q6H PRN Adefeso, Oladapo, DO   650 mg at 02/27/24 1653   Or   acetaminophen  (TYLENOL ) suppository 650 mg  650 mg Rectal Q6H PRN Adefeso, Oladapo, DO       amiodarone  (PACERONE ) tablet 200 mg  200 mg Oral BID Johnson, Clanford L, MD   200 mg at 02/28/24 9170   Chlorhexidine  Gluconate Cloth 2 % PADS 6 each  6 each Topical Q0600 Shahmehdi, Seyed A, MD   6 each at 02/28/24 9170   cyanocobalamin  (VITAMIN B12) tablet 1,000 mcg  1,000 mcg Oral Daily Johnson, Clanford L, MD   1,000 mcg at 02/28/24 9171   diltiazem  (CARDIZEM  CD) 24 hr capsule 180 mg  180 mg Oral Daily Johnson, Clanford L, MD   180 mg at 02/28/24 1123   docusate sodium  (COLACE) capsule 100 mg  100 mg Oral QPM Johnson, Clanford L, MD   100 mg at 02/27/24 1653   heparin  ADULT infusion 100 units/mL (25000 units/250mL)  750 Units/hr Intravenous Continuous Tanda Dempsey SAUNDERS, RPH 7.5 mL/hr at 02/27/24 1100 750 Units/hr at 02/27/24 1100   irbesartan   (AVAPRO ) tablet 150 mg  150 mg Oral Daily Debera Jayson MATSU, MD   150 mg at 02/28/24 9170   labetalol  (NORMODYNE ) injection 10 mg  10 mg Intravenous Q2H PRN Ogan, Okoronkwo U, MD   10 mg at 02/26/24 2315   levothyroxine  (SYNTHROID ) tablet 100 mcg  100 mcg Oral QAC breakfast Adefeso, Oladapo, DO   100 mcg at 02/28/24 0545   magnesium  oxide (MAG-OX) tablet 400 mg  400 mg Oral Daily Shahmehdi, Seyed A, MD   400 mg at 02/28/24 9170   multivitamin with minerals tablet 1 tablet  1 tablet Oral Daily Vicci, Clanford L, MD   1 tablet at 02/28/24 9171   ondansetron  (ZOFRAN ) tablet 4 mg  4 mg Oral Q6H PRN Adefeso, Oladapo, DO       Or   ondansetron  (ZOFRAN ) injection 4 mg  4 mg Intravenous Q6H PRN Adefeso, Oladapo, DO   4 mg at 02/21/24 2210  Oral care mouth rinse  15 mL Mouth Rinse PRN Johnson, Clanford L, MD       pantoprazole  (PROTONIX ) EC tablet 40 mg  40 mg Oral BID Johnson, Clanford L, MD   40 mg at 02/28/24 9170   prochlorperazine  (COMPAZINE ) injection 10 mg  10 mg Intravenous Q6H PRN Adefeso, Oladapo, DO   10 mg at 02/22/24 1054   protein supplement (ENSURE MAX) liquid  11 oz Oral Daily Johnson, Clanford L, MD   11 oz at 02/26/24 1028   traMADol  (ULTRAM ) tablet 50 mg  50 mg Oral Q6H PRN Noralee Elidia Sieving, MD   50 mg at 02/27/24 2119   zolpidem  (AMBIEN ) tablet 5 mg  5 mg Oral QHS PRN Vicci Pen L, MD   5 mg at 02/27/24 2119    Allergies as of 02/20/2024 - Review Complete 02/20/2024  Allergen Reaction Noted   Amlodipine Swelling 06/04/2020   Clonidine Rash 06/04/2020    Family History  Problem Relation Age of Onset   Hypertension Father    Colon cancer Neg Hx    Colon polyps Neg Hx     Social History   Socioeconomic History   Marital status: Married    Spouse name: Not on file   Number of children: Not on file   Years of education: Not on file   Highest education level: Not on file  Occupational History   Not on file  Tobacco Use   Smoking status: Never    Smokeless tobacco: Never  Vaping Use   Vaping status: Never Used  Substance and Sexual Activity   Alcohol use: Never   Drug use: Never   Sexual activity: Not on file  Other Topics Concern   Not on file  Social History Narrative   Not on file   Social Drivers of Health   Financial Resource Strain: Not on file  Food Insecurity: No Food Insecurity (02/21/2024)   Hunger Vital Sign    Worried About Running Out of Food in the Last Year: Never true    Ran Out of Food in the Last Year: Never true  Transportation Needs: No Transportation Needs (02/21/2024)   PRAPARE - Administrator, Civil Service (Medical): No    Lack of Transportation (Non-Medical): No  Physical Activity: Not on file  Stress: Not on file  Social Connections: Socially Integrated (02/21/2024)   Social Connection and Isolation Panel    Frequency of Communication with Friends and Family: More than three times a week    Frequency of Social Gatherings with Friends and Family: More than three times a week    Attends Religious Services: More than 4 times per year    Active Member of [employer] or Organizations: Yes    Attends Engineer, structural: More than 4 times per year    Marital Status: Married  Catering manager Violence: Not At Risk (02/21/2024)   Humiliation, Afraid, Rape, and Kick questionnaire    Fear of Current or Ex-Partner: No    Emotionally Abused: No    Physically Abused: No    Sexually Abused: No     Review of Systems   Gen: see HPI CV: Denies chest pain, heart palpitations, syncope, edema  Resp: Denies shortness of breath with rest, cough, wheezing, coughing up blood, and pleurisy. GI: see HPI GU : Denies urinary burning, blood in urine, urinary frequency, and urinary incontinence. MS: +back pain Derm: Denies rash, itching, dry skin, hives. Psych: Denies depression, anxiety, memory loss, hallucinations,  and confusion. Heme: Denies bruising or bleeding Neuro:  Denies any headaches,  dizziness, paresthesias, shaking  Physical Exam   Vital Signs in last 24 hours: Temp:  [97.8 F (36.6 C)-98.3 F (36.8 C)] 98.3 F (36.8 C) (09/30 1217) Pulse Rate:  [67-83] 83 (09/30 1217) Resp:  [17-20] 20 (09/30 1217) BP: (118-145)/(36-51) 118/51 (09/30 1217) SpO2:  [95 %-100 %] 100 % (09/30 1217) Weight:  [74.9 kg] 74.9 kg (09/30 0511) Last BM Date : 02/27/24  General:   Alert,  Well-developed, well-nourished, pleasant and cooperative in NAD Head:  Normocephalic and atraumatic. Eyes:  Sclera clear, no icterus.   Conjunctiva pink. Lungs:  Clear throughout to auscultation.    Heart:  S1 S2 present, no murmur Abdomen:  Soft, nontender and nondistended. Ostomy in RLQ, air in bag, no blood noted.  Rectal: deferred   Msk:  Symmetrical without gross deformities. Normal posture. Extremities:  Without clubbing or edema. Neurologic:  Alert and  oriented x4. Skin:  Intact without significant lesions or rashes. Psych:  Alert and cooperative. Normal mood and affect.  Intake/Output from previous day: 09/29 0701 - 09/30 0700 In: 78.7 [I.V.:28.6; IV Piggyback:50.1] Out: 50 [Stool:50] Intake/Output this shift: Total I/O In: 120 [P.O.:120] Out: -     Labs/Studies   Recent Labs Recent Labs    02/27/24 0420 02/27/24 1702 02/28/24 0447  WBC 6.8 5.7 5.7  HGB 8.5* 9.1* 8.3*  HCT 25.7* 27.5* 25.3*  PLT 177 196 190   BMET Recent Labs    02/26/24 0525 02/27/24 0420 02/28/24 0447  NA 132* 135 135  K 4.1 3.9 4.6  CL 103 105 105  CO2 24 24 23   GLUCOSE 85 86 85  BUN 25* 25* 25*  CREATININE 1.36* 1.26* 1.45*  CALCIUM 8.1* 8.3* 8.4*     Assessment   Felicia Frank is a  very pleasant 78 y.o. year old female with medical history of hypothyroidism, hypertension, chronic kidney disease stage IIIa, class I obesity, A fib, perforated diverticulitis with abscess s/p drain placement and enteral fistula formation s/p Harman's procedure with SBR and ileocecectomy May 2025, PUD  diagnosed in May 2025 during hospitalization, presenting this admission with aflutter with RVR new onset this admission. Initially, she was on amiodarone  drip and then switched to oral route, started on heparin  drip on 9/26 due to past history and had decrease in Hgb from initially 11 to the 8 range without overt GI bleeding and was heme negative. Notably, she was not started on anticoagulation in May 2025 due to acute blood loss anemia and shock. Therefore, AC ws started this admission in light of presentation. Plans for outpatient watchman procedure. Cardiology recommending GI consultation in light of drifting Hgb and ensure no occult GI losses.  Drifting Hgb in setting of Heparin  drip: Hgb baseline around the 11 range. This admission, Hgb declined to 9.5 after starting heparin  and a low of 8.3 this morning; I do note this has been fluctuating and 9.1 yesterday. Overall, Hgb has been around the mid 8 range past 48 hours. No overt GI bleeding and heme negative. Suspect largely dilutional component but with history of PUD in May 2025, could certainly have blood loss from this. As colonoscopy also in  remote past and needing anticoagulation ongoing, recommend colonoscopy via ostomy and EGD tomorrow to rule out occult blood loss from GI source.   Notably, procedure on 10/13/23 by Dr. Mavis included ileocecectomy, end colostomy, distal sigmoid colon resection, and rectum with approximately 3 cm rectum left  above peritoneal reflection.   Due to difficulty tolerating prep historically, we will do a Miralax prep.   Plan / Recommendations    Hold heparin  starting at 0600 Colonoscopy via ostomy and EGD on 10/1 Clear liquids NPO after midnight Miralax prep Further recommendations to follow      02/28/2024, 12:46 PM  Therisa MICAEL Stager, PhD, ANP-BC [facility]

## 2024-02-28 NOTE — Plan of Care (Signed)
   Problem: Education: Goal: Knowledge of General Education information will improve Description Including pain rating scale, medication(s)/side effects and non-pharmacologic comfort measures Outcome: Progressing   Problem: Health Behavior/Discharge Planning: Goal: Ability to manage health-related needs will improve Outcome: Progressing

## 2024-02-28 NOTE — Progress Notes (Signed)
 PHARMACY - ANTICOAGULATION CONSULT NOTE  Pharmacy Consult for heparin  Indication: atrial fibrillation  Allergies  Allergen Reactions   Amlodipine Swelling   Clonidine Rash    Rash with patch only.  Okay to take pill    Patient Measurements: Height: 5' 3.5 (161.3 cm) Weight: 74.9 kg (165 lb 2 oz) IBW/kg (Calculated) : 53.55 HEPARIN  DW (KG): 68.7  Vital Signs: Temp: 98.1 F (36.7 C) (09/30 0436) Temp Source: Oral (09/30 0436) BP: 124/43 (09/30 0436) Pulse Rate: 76 (09/30 0436)  Labs: Recent Labs    02/26/24 0525 02/27/24 0420 02/27/24 0808 02/27/24 1702 02/28/24 0447  HGB 8.7* 8.5*  --  9.1* 8.3*  HCT 26.6* 25.7*  --  27.5* 25.3*  PLT 162 177  --  196 190  HEPARINUNFRC 0.36  --  0.31  --  0.36  CREATININE 1.36* 1.26*  --   --  1.45*    Estimated Creatinine Clearance: 31.3 mL/min (A) (by C-G formula based on SCr of 1.45 mg/dL (H)).   Medical History: Past Medical History:  Diagnosis Date   Anemia    Atrial fibrillation (HCC)    Chronic back pain    Chronic kidney disease    Diverticulosis    History of fall    HTN (hypertension)    Hypothyroidism    Osteoporosis     Assessment: 78 year old female with history of afib. Taken off anticoagulation in May and then may have been lost to follow up and never restarted on anticoagulation. New orders to start heparin  with plans to resume DOAC closer to discharge.   Heparin  level continues to be at goal (0.36) on 750 units/hr. Hemoglobin was up last night to 9.1 but now down again this morning to 8.3. Will continue to follow closely.   Goal of Therapy:  Heparin  level 0.3-0.7 units/ml Monitor platelets by anticoagulation protocol: Yes   Plan:  Continue heparin  infusion to 750 units/hr  Daily heparin  levels and CBC   Dempsey Blush PharmD., BCPS Clinical Pharmacist 02/28/2024 8:13 AM

## 2024-02-28 NOTE — H&P (View-Only) (Signed)
 Gastroenterology Consult   Referring Provider: Dr. Vicci Primary Care Physician:  Dino Devere RAMAN, MD Primary Gastroenterologist:  Dr. Cindie  Patient ID: Felicia Frank; [id number]; Dec 19, 1945   Admit date: 02/20/2024  LOS: 6 days   Date of Consultation: 02/28/2024  Reason for Consultation:  Anemia on heparin , heme negative  History of Present Illness   Felicia Frank is a very pleasant 78 y.o. year old female with medical history of hypothyroidism, hypertension, chronic kidney disease stage IIIa, class I obesity, A fib, perforated diverticulitis with abscess s/p drain placement and enteral fistula formation s/p Harman's procedure with SBR and ileocecectomy May 2025, PUD diagnosed in May 2025 during hospitalization, presenting this admission with aflutter with RVR new onset this admission. Initially, she was on amiodarone  drip and then switched to oral route, started on heparin  drip on 9/26 due to past history and had decrease in Hgb from initially 11 to the 8 range without overt GI bleeding and was heme negative. Notably, she was not started on anticoagulation in May 2025 due to acute blood loss anemia and shock. Therefore, AC ws started this admission in light of presentation. Plans for outpatient watchman procedure. Cardiology recommending GI consultation in light of drifting Hgb and ensure no occult GI losses.  Today: Hgb 8.3. Her baseline Hgb appears to be in the 11 range. Heparin  drip started this admission with decline to 9.5 and then 8.5. Overall, Hgb has remained essentially unchanged in the mid 8 range for 48 hours. No overt GI bleeding. Heme negative. Suspected component of hemodilution.   She denies any prior overt GI bleeding. Has watery or soft stool from ostomy pouch depending on food choices. Notes pills pass through quickly. She has had decreased appetite since procedure in May 2025 and notes weight loss. No abdominal pain. No GERD symptoms. She is on famotidine  as  outpatient. No dysphagia. No N/V. Appears she is on an 81 mg aspirin  daily. No other NSAIDs or aspirin  powders. No PPI.    EGD May 2025: normal esophagus, non-bleeding gastric ulcer with clean ulcer base, normal duodenum.   Last colonoscopy in past by Dr. Donnel. No polyps per patient.    Past Medical History:  Diagnosis Date   Anemia    Atrial fibrillation (HCC)    Chronic back pain    Chronic kidney disease    Diverticulosis    History of fall    HTN (hypertension)    Hypothyroidism    Osteoporosis     Past Surgical History:  Procedure Laterality Date   ABDOMINAL HYSTERECTOMY     BACK SURGERY     CHOLECYSTECTOMY     COLECTOMY WITH COLOSTOMY CREATION/HARTMANN PROCEDURE N/A 10/13/2023   Procedure: PARTIAL COLECTOMY, WITH COLOSTOMY CREATION, PARTIAL SMALL BOWEL RESECTION, ILEOCECECTOMY;  Surgeon: Mavis Anes, MD;  Location: [facility];  Service: General;  Laterality: N/A;   ESOPHAGOGASTRODUODENOSCOPY N/A 10/21/2023   Procedure: EGD (ESOPHAGOGASTRODUODENOSCOPY);  Surgeon: Cindie Carlin POUR, DO;  Location: [facility];  Service: Endoscopy;  Laterality: N/A;   TONSILLECTOMY      Prior to Admission medications   Medication Sig Start Date End Date Taking? Authorizing Provider  aspirin  EC 81 MG tablet Take 81 mg by mouth. 12/29/23  Yes [provider]  bumetanide  (BUMEX ) 1 MG tablet Take 0.5-1 mg by mouth daily as needed (fluid retention.). 01/09/24  Yes [provider]  carvedilol  (COREG ) 25 MG tablet Take 25 mg by mouth in the morning and at bedtime. 12/15/23  Yes [provider]  cloNIDine (CATAPRES) 0.1 MG tablet Take 0.1 mg by mouth 3 (three) times daily as needed (HIGH BLOOD PRESSURE (BP > 150)). 12/29/23  Yes [provider]  doxazosin  (CARDURA ) 8 MG tablet Take 8 mg by mouth at bedtime. 12/29/23  Yes [provider]  famotidine  (PEPCID ) 40 MG tablet Take 40 mg by mouth in the morning. 12/29/23  Yes [provider]  hydrALAZINE  (APRESOLINE) 25 MG tablet Take 25 mg by mouth in the morning and at bedtime. 02/09/24  Yes [provider]  levothyroxine  (SYNTHROID ) 100 MCG tablet Take 100 mcg by mouth daily before breakfast. 02/09/24  Yes [provider]  magnesium  oxide (MAG-OX) 400 MG tablet Take 400 mg by mouth daily. 11/22/23  Yes [provider]  spironolactone-hydrochlorothiazide (ALDACTAZIDE) 25-25 MG tablet Take 1 tablet by mouth as directed. Every other day as needed for Fluid   Yes [provider]  telmisartan (MICARDIS) 80 MG tablet Take 80 mg by mouth in the morning.   Yes [provider]  traMADol  (ULTRAM ) 50 MG tablet Take 50 mg by mouth every 12 (twelve) hours as needed (pain.).   Yes [provider]  zolpidem  (AMBIEN ) 10 MG tablet Take 10 mg by mouth at bedtime as needed.   Yes [provider]    Current Facility-Administered Medications  Medication Dose Route Frequency Provider Last Rate Last Admin   acetaminophen  (TYLENOL ) tablet 650 mg  650 mg Oral Q6H PRN Adefeso, Oladapo, DO   650 mg at 02/27/24 1653   Or   acetaminophen  (TYLENOL ) suppository 650 mg  650 mg Rectal Q6H PRN Adefeso, Oladapo, DO       amiodarone  (PACERONE ) tablet 200 mg  200 mg Oral BID Johnson, Clanford L, MD   200 mg at 02/28/24 9170   Chlorhexidine  Gluconate Cloth 2 % PADS 6 each  6 each Topical Q0600 Shahmehdi, Seyed A, MD   6 each at 02/28/24 9170   cyanocobalamin  (VITAMIN B12) tablet 1,000 mcg  1,000 mcg Oral Daily Johnson, Clanford L, MD   1,000 mcg at 02/28/24 9171   diltiazem  (CARDIZEM  CD) 24 hr capsule 180 mg  180 mg Oral Daily Johnson, Clanford L, MD   180 mg at 02/28/24 1123   docusate sodium  (COLACE) capsule 100 mg  100 mg Oral QPM Johnson, Clanford L, MD   100 mg at 02/27/24 1653   heparin  ADULT infusion 100 units/mL (25000 units/250mL)  750 Units/hr Intravenous Continuous Tanda Dempsey SAUNDERS, RPH 7.5 mL/hr at 02/27/24 1100 750 Units/hr at 02/27/24 1100   irbesartan   (AVAPRO ) tablet 150 mg  150 mg Oral Daily Debera Jayson MATSU, MD   150 mg at 02/28/24 9170   labetalol  (NORMODYNE ) injection 10 mg  10 mg Intravenous Q2H PRN Ogan, Okoronkwo U, MD   10 mg at 02/26/24 2315   levothyroxine  (SYNTHROID ) tablet 100 mcg  100 mcg Oral QAC breakfast Adefeso, Oladapo, DO   100 mcg at 02/28/24 0545   magnesium  oxide (MAG-OX) tablet 400 mg  400 mg Oral Daily Shahmehdi, Seyed A, MD   400 mg at 02/28/24 9170   multivitamin with minerals tablet 1 tablet  1 tablet Oral Daily Vicci, Clanford L, MD   1 tablet at 02/28/24 9171   ondansetron  (ZOFRAN ) tablet 4 mg  4 mg Oral Q6H PRN Adefeso, Oladapo, DO       Or   ondansetron  (ZOFRAN ) injection 4 mg  4 mg Intravenous Q6H PRN Adefeso, Oladapo, DO   4 mg at 02/21/24 2210  Oral care mouth rinse  15 mL Mouth Rinse PRN Johnson, Clanford L, MD       pantoprazole  (PROTONIX ) EC tablet 40 mg  40 mg Oral BID Johnson, Clanford L, MD   40 mg at 02/28/24 9170   prochlorperazine  (COMPAZINE ) injection 10 mg  10 mg Intravenous Q6H PRN Adefeso, Oladapo, DO   10 mg at 02/22/24 1054   protein supplement (ENSURE MAX) liquid  11 oz Oral Daily Johnson, Clanford L, MD   11 oz at 02/26/24 1028   traMADol  (ULTRAM ) tablet 50 mg  50 mg Oral Q6H PRN Noralee Elidia Sieving, MD   50 mg at 02/27/24 2119   zolpidem  (AMBIEN ) tablet 5 mg  5 mg Oral QHS PRN Vicci Pen L, MD   5 mg at 02/27/24 2119    Allergies as of 02/20/2024 - Review Complete 02/20/2024  Allergen Reaction Noted   Amlodipine Swelling 06/04/2020   Clonidine Rash 06/04/2020    Family History  Problem Relation Age of Onset   Hypertension Father    Colon cancer Neg Hx    Colon polyps Neg Hx     Social History   Socioeconomic History   Marital status: Married    Spouse name: Not on file   Number of children: Not on file   Years of education: Not on file   Highest education level: Not on file  Occupational History   Not on file  Tobacco Use   Smoking status: Never    Smokeless tobacco: Never  Vaping Use   Vaping status: Never Used  Substance and Sexual Activity   Alcohol use: Never   Drug use: Never   Sexual activity: Not on file  Other Topics Concern   Not on file  Social History Narrative   Not on file   Social Drivers of Health   Financial Resource Strain: Not on file  Food Insecurity: No Food Insecurity (02/21/2024)   Hunger Vital Sign    Worried About Running Out of Food in the Last Year: Never true    Ran Out of Food in the Last Year: Never true  Transportation Needs: No Transportation Needs (02/21/2024)   PRAPARE - Administrator, Civil Service (Medical): No    Lack of Transportation (Non-Medical): No  Physical Activity: Not on file  Stress: Not on file  Social Connections: Socially Integrated (02/21/2024)   Social Connection and Isolation Panel    Frequency of Communication with Friends and Family: More than three times a week    Frequency of Social Gatherings with Friends and Family: More than three times a week    Attends Religious Services: More than 4 times per year    Active Member of [employer] or Organizations: Yes    Attends Engineer, structural: More than 4 times per year    Marital Status: Married  Catering manager Violence: Not At Risk (02/21/2024)   Humiliation, Afraid, Rape, and Kick questionnaire    Fear of Current or Ex-Partner: No    Emotionally Abused: No    Physically Abused: No    Sexually Abused: No     Review of Systems   Gen: see HPI CV: Denies chest pain, heart palpitations, syncope, edema  Resp: Denies shortness of breath with rest, cough, wheezing, coughing up blood, and pleurisy. GI: see HPI GU : Denies urinary burning, blood in urine, urinary frequency, and urinary incontinence. MS: +back pain Derm: Denies rash, itching, dry skin, hives. Psych: Denies depression, anxiety, memory loss, hallucinations,  and confusion. Heme: Denies bruising or bleeding Neuro:  Denies any headaches,  dizziness, paresthesias, shaking  Physical Exam   Vital Signs in last 24 hours: Temp:  [97.8 F (36.6 C)-98.3 F (36.8 C)] 98.3 F (36.8 C) (09/30 1217) Pulse Rate:  [67-83] 83 (09/30 1217) Resp:  [17-20] 20 (09/30 1217) BP: (118-145)/(36-51) 118/51 (09/30 1217) SpO2:  [95 %-100 %] 100 % (09/30 1217) Weight:  [74.9 kg] 74.9 kg (09/30 0511) Last BM Date : 02/27/24  General:   Alert,  Well-developed, well-nourished, pleasant and cooperative in NAD Head:  Normocephalic and atraumatic. Eyes:  Sclera clear, no icterus.   Conjunctiva pink. Lungs:  Clear throughout to auscultation.    Heart:  S1 S2 present, no murmur Abdomen:  Soft, nontender and nondistended. Ostomy in RLQ, air in bag, no blood noted.  Rectal: deferred   Msk:  Symmetrical without gross deformities. Normal posture. Extremities:  Without clubbing or edema. Neurologic:  Alert and  oriented x4. Skin:  Intact without significant lesions or rashes. Psych:  Alert and cooperative. Normal mood and affect.  Intake/Output from previous day: 09/29 0701 - 09/30 0700 In: 78.7 [I.V.:28.6; IV Piggyback:50.1] Out: 50 [Stool:50] Intake/Output this shift: Total I/O In: 120 [P.O.:120] Out: -     Labs/Studies   Recent Labs Recent Labs    02/27/24 0420 02/27/24 1702 02/28/24 0447  WBC 6.8 5.7 5.7  HGB 8.5* 9.1* 8.3*  HCT 25.7* 27.5* 25.3*  PLT 177 196 190   BMET Recent Labs    02/26/24 0525 02/27/24 0420 02/28/24 0447  NA 132* 135 135  K 4.1 3.9 4.6  CL 103 105 105  CO2 24 24 23   GLUCOSE 85 86 85  BUN 25* 25* 25*  CREATININE 1.36* 1.26* 1.45*  CALCIUM 8.1* 8.3* 8.4*     Assessment   Felicia Frank is a  very pleasant 78 y.o. year old female with medical history of hypothyroidism, hypertension, chronic kidney disease stage IIIa, class I obesity, A fib, perforated diverticulitis with abscess s/p drain placement and enteral fistula formation s/p Harman's procedure with SBR and ileocecectomy May 2025, PUD  diagnosed in May 2025 during hospitalization, presenting this admission with aflutter with RVR new onset this admission. Initially, she was on amiodarone  drip and then switched to oral route, started on heparin  drip on 9/26 due to past history and had decrease in Hgb from initially 11 to the 8 range without overt GI bleeding and was heme negative. Notably, she was not started on anticoagulation in May 2025 due to acute blood loss anemia and shock. Therefore, AC ws started this admission in light of presentation. Plans for outpatient watchman procedure. Cardiology recommending GI consultation in light of drifting Hgb and ensure no occult GI losses.  Drifting Hgb in setting of Heparin  drip: Hgb baseline around the 11 range. This admission, Hgb declined to 9.5 after starting heparin  and a low of 8.3 this morning; I do note this has been fluctuating and 9.1 yesterday. Overall, Hgb has been around the mid 8 range past 48 hours. No overt GI bleeding and heme negative. Suspect largely dilutional component but with history of PUD in May 2025, could certainly have blood loss from this. As colonoscopy also in  remote past and needing anticoagulation ongoing, recommend colonoscopy via ostomy and EGD tomorrow to rule out occult blood loss from GI source.   Notably, procedure on 10/13/23 by Dr. Mavis included ileocecectomy, end colostomy, distal sigmoid colon resection, and rectum with approximately 3 cm rectum left  above peritoneal reflection.   Due to difficulty tolerating prep historically, we will do a Miralax prep.   Plan / Recommendations    Hold heparin  starting at 0600 Colonoscopy via ostomy and EGD on 10/1 Clear liquids NPO after midnight Miralax prep Further recommendations to follow      02/28/2024, 12:46 PM  Therisa MICAEL Stager, PhD, ANP-BC [facility]

## 2024-02-28 NOTE — Plan of Care (Signed)

## 2024-02-29 ENCOUNTER — Encounter (HOSPITAL_COMMUNITY)
Admission: EM | Disposition: A | Discharge status: Home / Self Care | Payer: Self-pay | Source: Home / Self Care | Attending: Family Medicine

## 2024-02-29 DIAGNOSIS — I10 Essential (primary) hypertension: Secondary | ICD-10-CM | POA: Diagnosis not present

## 2024-02-29 DIAGNOSIS — N1832 Chronic kidney disease, stage 3b: Secondary | ICD-10-CM

## 2024-02-29 DIAGNOSIS — I4892 Unspecified atrial flutter: Secondary | ICD-10-CM | POA: Diagnosis not present

## 2024-02-29 DIAGNOSIS — I480 Paroxysmal atrial fibrillation: Secondary | ICD-10-CM | POA: Diagnosis not present

## 2024-02-29 LAB — IRON AND TIBC
Iron: 144 ug/dL (ref 28–170)
Saturation Ratios: 55 % — ABNORMAL HIGH (ref 10.4–31.8)
TIBC: 263 ug/dL (ref 250–450)
UIBC: 119 ug/dL

## 2024-02-29 LAB — CBC
HCT: 28.2 % — ABNORMAL LOW (ref 36.0–46.0)
Hemoglobin: 9 g/dL — ABNORMAL LOW (ref 12.0–15.0)
MCH: 33.6 pg (ref 26.0–34.0)
MCHC: 31.9 g/dL (ref 30.0–36.0)
MCV: 105.2 fL — ABNORMAL HIGH (ref 80.0–100.0)
Platelets: 221 K/uL (ref 150–400)
RBC: 2.68 MIL/uL — ABNORMAL LOW (ref 3.87–5.11)
RDW: 13.6 % (ref 11.5–15.5)
WBC: 7.2 K/uL (ref 4.0–10.5)
nRBC: 0 % (ref 0.0–0.2)

## 2024-02-29 LAB — MAGNESIUM: Magnesium: 2.1 mg/dL (ref 1.7–2.4)

## 2024-02-29 LAB — BASIC METABOLIC PANEL WITH GFR
Anion gap: 10 (ref 5–15)
BUN: 21 mg/dL (ref 8–23)
CO2: 22 mmol/L (ref 22–32)
Calcium: 9.3 mg/dL (ref 8.9–10.3)
Chloride: 102 mmol/L (ref 98–111)
Creatinine, Ser: 1.49 mg/dL — ABNORMAL HIGH (ref 0.44–1.00)
GFR, Estimated: 36 mL/min — ABNORMAL LOW (ref >60)
Glucose, Bld: 81 mg/dL (ref 70–99)
Potassium: 4.7 mmol/L (ref 3.5–5.1)
Sodium: 133 mmol/L — ABNORMAL LOW (ref 135–145)

## 2024-02-29 LAB — HEPARIN LEVEL (UNFRACTIONATED): Heparin Unfractionated: 0.94 [IU]/mL — ABNORMAL HIGH (ref 0.30–0.70)

## 2024-02-29 LAB — FERRITIN: Ferritin: 454 ng/mL — ABNORMAL HIGH (ref 11–307)

## 2024-02-29 SURGERY — COLONOSCOPY
Anesthesia: Choice

## 2024-02-29 MED ORDER — BOOST / RESOURCE BREEZE PO LIQD CUSTOM
1.0000 | Freq: Three times a day (TID) | ORAL | Status: DC
  Administered 2024-02-29 – 2024-03-02 (×2): 1 via ORAL

## 2024-02-29 MED ORDER — POLYETHYLENE GLYCOL 3350 17 GM/SCOOP PO POWD
119.0000 g | Freq: Once | ORAL | Status: AC
  Administered 2024-02-29: 119 g via ORAL
  Filled 2024-02-29: qty 119

## 2024-02-29 MED ORDER — HEPARIN (PORCINE) 25000 UT/250ML-% IV SOLN
750.0000 [IU]/h | INTRAVENOUS | Status: AC
  Administered 2024-02-29: 750 [IU]/h via INTRAVENOUS
  Filled 2024-02-29: qty 250

## 2024-02-29 NOTE — Plan of Care (Signed)

## 2024-02-29 NOTE — Progress Notes (Signed)
 PHARMACY - ANTICOAGULATION CONSULT NOTE  Pharmacy Consult for heparin  Indication: atrial fibrillation  Allergies  Allergen Reactions   Amlodipine Swelling   Clonidine Rash    Rash with patch only.  Okay to take pill    Patient Measurements: Height: 5' 3.5 (161.3 cm) Weight: 75 kg (165 lb 5.5 oz) IBW/kg (Calculated) : 53.55 HEPARIN  DW (KG): 68.7  Vital Signs: Temp: 97.8 F (36.6 C) (10/01 1352) Temp Source: Oral (10/01 1352) BP: 129/62 (10/01 1352) Pulse Rate: 74 (10/01 1352)  Labs: Recent Labs    02/27/24 0420 02/27/24 0808 02/27/24 1702 02/28/24 0447 02/29/24 0450  HGB 8.5*  --  9.1* 8.3* 9.0*  HCT 25.7*  --  27.5* 25.3* 28.2*  PLT 177  --  196 190 221  HEPARINUNFRC  --  0.31  --  0.36 0.94*  CREATININE 1.26*  --   --  1.45* 1.49*    Estimated Creatinine Clearance: 30.6 mL/min (A) (by C-G formula based on SCr of 1.49 mg/dL (H)).   Medical History: Past Medical History:  Diagnosis Date   Anemia    Atrial fibrillation (HCC)    Chronic back pain    Chronic kidney disease    Diverticulosis    History of fall    HTN (hypertension)    Hypothyroidism    Osteoporosis     Assessment: 78 year old female with history of afib. Taken off anticoagulation in May and then may have been lost to follow up and never restarted on anticoagulation. New orders to start heparin  with plans to resume DOAC closer to discharge.   Heparin  level continues to be at goal (0.36) on 750 units/hr. Hemoglobin 9.0 Goal of Therapy:  Heparin  level 0.3-0.7 units/ml Monitor platelets by anticoagulation protocol: Yes   Plan:  Restart heparin  infusion at 750 units/hr. To be stopped at 0600 for procedure. Daily heparin  levels and CBC   Elspeth Sour, PharmD Clinical Pharmacist 02/29/2024 5:01 PM

## 2024-02-29 NOTE — Progress Notes (Signed)
 Nutrition Follow-up  DOCUMENTATION CODES:   Not applicable  INTERVENTION:   -Boost Breeze po TID, each supplement provides 250 kcal and 9 grams of protein  -MVI with minerals daily -RD will follow for diet advancement and adjust supplement regimen as appropriate   NUTRITION DIAGNOSIS:   Inadequate oral intake related to decreased appetite as evidenced by per patient/family report.  Ongoing  GOAL:   Patient will meet greater than or equal to 90% of their needs  Progressing   MONITOR:   PO intake, Supplement acceptance  REASON FOR ASSESSMENT:   Malnutrition Screening Tool    ASSESSMENT:   78 yo female admitted with paroxysmal atrial flutter. 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  Reviewed I/O's: +371 ml x 24 hours and +4.2 L since admission  Colostomy output: 850 ml x 24 hours  Pt unavailable at time of visit. Attempted to speak with pt via call to hospital room phone, however, unable to reach. RD unable to obtain further nutrition-related history or complete nutrition-focused physical exam at this time.    Case discussed in interdisciplinary rounds.   EGD and colonoscopy was scheduled for yesterday, however, pt ate prior to procedure. Plan for procedure 03/01/24 per GI notes in order to to rule out any endoluminal etiologies leading to her worsening anemia.   Pt currently on clear liquids and will NPO after midnight. Noted pt with good appetite previously on Heart Healthy diet; meal completion records at 50-100%.   Wt has been stable over the past week.   Medications reviewed and include vitamin B-12, cardizem , colace, and magnesium  oxide.   Labs reviewed: Na: 133.    Diet Order:   Diet Order             Diet NPO time specified  Diet effective midnight           Diet clear liquid Room service appropriate? Yes; Fluid consistency: Thin  Diet effective now                    EDUCATION NEEDS:   No education needs have been identified at this time  Skin:  Skin Assessment: Reviewed RN Assessment  Last BM:  02/29/24 (300 ml output via ostomy)  Height:   Ht Readings from Last 1 Encounters:  02/21/24 5' 3.5 (1.613 m)    Weight:   Wt Readings from Last 1 Encounters:  02/29/24 75 kg    Ideal Body Weight:  53.4 kg  BMI:  Body mass index is 28.83 kg/m.  Estimated Nutritional Needs:   Kcal:  1650-1850  Protein:  85-95 gm  Fluid:  1.7-1.9 L    Margery ORN, RD, LDN, CDCES Registered Dietitian III Certified Diabetes Care and Education Specialist If unable to reach this RD, please use RD Inpatient group chat on secure chat between hours of 8am-4 pm daily

## 2024-02-29 NOTE — Discharge Instructions (Signed)
  Colonoscopy Discharge Instructions  Read the instructions outlined below and refer to this sheet in the next few weeks. These discharge instructions provide you with general information on caring for yourself after you leave the hospital. Your doctor may also give you specific instructions. While your treatment has been planned according to the most current medical practices available, unavoidable complications occasionally occur. If you have any problems or questions after discharge, call Dr. Shaaron at [phone]. ACTIVITY You may resume your regular activity, but move at a slower pace for the next 24 hours.  Take frequent rest periods for the next 24 hours.  Walking will help get rid of the air and reduce the bloated feeling in your belly (abdomen).  No driving for 24 hours (because of the medicine (anesthesia) used during the test).   Do not sign any important legal documents or operate any machinery for 24 hours (because of the anesthesia used during the test).  NUTRITION Drink plenty of fluids.  You may resume your normal diet as instructed by your doctor.  Begin with a light meal and progress to your normal diet. Heavy or fried foods are harder to digest and may make you feel sick to your stomach (nauseated).  Avoid alcoholic beverages for 24 hours or as instructed.  MEDICATIONS You may resume your normal medications unless your doctor tells you otherwise.  WHAT YOU CAN EXPECT TODAY Some feelings of bloating in the abdomen.  Passage of more gas than usual.  Spotting of blood in your stool or on the toilet paper.  IF YOU HAD POLYPS REMOVED DURING THE COLONOSCOPY: No aspirin  products for 7 days or as instructed.  No alcohol for 7 days or as instructed.  Eat a soft diet for the next 24 hours.  FINDING OUT THE RESULTS OF YOUR TEST Not all test results are available during your visit. If your test results are not back during the visit, make an appointment with your caregiver to find out the  results. Do not assume everything is normal if you have not heard from your caregiver or the medical facility. It is important for you to follow up on all of your test results.  SEEK IMMEDIATE MEDICAL ATTENTION IF: You have more than a spotting of blood in your stool.  Your belly is swollen (abdominal distention).  You are nauseated or vomiting.  You have a temperature over 101.  You have abdominal pain or discomfort that is severe or gets worse throughout the day.      3 small polyps found and removed  Further recommendations to follow pending review of pathology report  At patient request, I called Devere Le at [phone] -rolled to voicemail.  Left a detailed message.

## 2024-02-29 NOTE — Progress Notes (Signed)
 PROGRESS NOTE  Felicia Frank FMW:[id number] DOB: Oct 21, 1945 DOA: 02/20/2024 PCP: Dino Devere RAMAN, MD  Brief History:  Felicia Frank is a 78 y.o. female with medical history significant of HTN, hypothyroidism, Atrial fibrilation, CKD 3A who presents to the emergency department due to sensation of chest tightness and bilateral arm heaviness which started yesterday in the morning, this was associated with low energy.  She checked her heart rate at home and it ranged within 145 to 160 bpm.  This improved on waking up this morning to 80, but this quickly increased again.  Apparently, patient has not been taking her beta-blocker due to low blood pressure. Patient was not on any anticoagulant at this time due to GI bleed and anemia that was stopped when she was initially placed on Eliquis .   ED Course:  Patient was tachypneic, tachycardic, but other vital signs were within normal range.  Workup in the ED showed macrocytic anemia, BMP shows sodium 135, potassium 3.4, chloride 104, bicarb 18, blood glucose 116, BUN/creatinine 36/1.51 (baseline creatinine 1.0-1.1).  Troponin 43 > 52.  BNP 187. Chest x-ray showed no active disease IV Cardizem  10 mg x 1 was given, patient was started on Cardizem  drip.  TRH was asked to admit patient   Assessment/Plan: Paroxysmal atrial flutter with RVR  -Patient was treated with IV amiodarone  infusion and now transitioned to oral amiodarone  200 mg BID -She stopped taking her beta-blocker due to low blood pressures -Cardiology consult appreciated -avapro  150 mg daily started by cardiology -holding home carvedilol  for now  -after discussion with cardiology, reached out to Dr Shaaron and Dr. Kallie about potential rechallenge pt for full anticoagulation and after discussions they both recommend that it is reasonable to restart full anticoagulation now with careful monitoring. Dr Alvan discussed with patient and she is agreeable -IV heparin  started  9/26--Hgb drifted down from 11>>9 -pt converted to SR  but went back into aflutter RVR, another bolus amio given - pt reports no black or blood stools but we will continue to follow this closely. Continue BID PPI for GI protection.  -9/28--oral amiodarone  200 mg BID -she remains on IV heparin  infusion and closely monitoring Hg with plans to transition to DOAC close to DC -with elevated BPs, increased diltiazem  to 60 mg every 6 hours -- transitioned to oral diltiazem  CD 180 mg per cardio recs -9/30: Hg down to 8.3, GI consult requested, remains on IV heparin , stool hemoccults guaiac neg so far -10/2--planning endoscopic eval   Hypokalemia/hypomagnesia Maintaining serum K > 4.0, magnesium >2.0    Macrocytic anemia/B12 deficiency MCV 103.0; B12< 150 low , folate 12.5-normal -B12 supplement IM x 1 given and daily oral B12 1000 mcg ordered  -Hg trending down but no report of melena -hemoccult to stools ordered and negative x 2  -possibly hemodilution as cause of downtrend -BID PPI  -Hg holding stable at 8    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   No RWMA.  LV diastolic parameters indeterminate.  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  Continue ostomy bag care  Hx hemorrhagic shock -May 2025--required 8 units PRBC   GERD Continue PPI   Compression fracture of L1 Continue Tylenol  as needed Patient  states that she has an appointment for surgical repair soon      Family Communication:   no Family at bedside  Consultants:  GI, cardiology  Code Status:  FULL   DVT Prophylaxis:  IV Heparin     Procedures: As Listed in Progress Note Above  Antibiotics: None      Subjective: Patient denies fevers, chills, headache, chest pain, dyspnea, nausea, vomiting, diarrhea, abdominal pain, dysuria, hematuria, hematochezia, and melena.   Objective: Vitals:   02/28/24 2103 02/29/24 0500 02/29/24 0553 02/29/24 1352  BP: (!) 155/53  (!) 156/50 129/62  Pulse: 74  75 74  Resp: 17  15   Temp: 98.1 F (36.7 C)  99 F (37.2 C) 97.8 F (36.6 C)  TempSrc: Oral  Oral Oral  SpO2: 96%  94% 99%  Weight:  75 kg    Height:        Intake/Output Summary (Last 24 hours) at 02/29/2024 1802 Last data filed at 02/29/2024 1743 Gross per 24 hour  Intake 1041.15 ml  Output 1750 ml  Net -708.85 ml   Weight change: 0.1 kg Exam:  General:  Pt is alert, follows commands appropriately, not in acute distress HEENT: No icterus, No thrush, No neck mass, [place]/AT Cardiovascular: RRR, S1/S2, no rubs, no gallops Respiratory: CTA bilaterally, no wheezing, no crackles, no rhonchi Abdomen: Soft/+BS, non tender, non distended, no guarding Extremities: No edema, No lymphangitis, No petechiae, No rashes, no synovitis   Data Reviewed: I have personally reviewed following labs and imaging studies Basic Metabolic Panel: Recent Labs  Lab 02/25/24 0422 02/26/24 0525 02/26/24 0729 02/27/24 0420 02/28/24 0447 02/29/24 0450  NA 132* 132*  --  135 135 133*  K 3.7 4.1  --  3.9 4.6 4.7  CL 103 103  --  105 105 102  CO2 23 24  --  24 23 22   GLUCOSE 108* 85  --  86 85 81  BUN 30* 25*  --  25* 25* 21  CREATININE 1.59* 1.36*  --  1.26* 1.45* 1.49*  CALCIUM 8.1* 8.1*  --  8.3* 8.4* 9.3  MG 1.8  --  2.1 1.8 2.2 2.1   Liver Function Tests: No results for input(s): AST, ALT, ALKPHOS, BILITOT, PROT,  ALBUMIN  in the last 168 hours. No results for input(s): LIPASE, AMYLASE in the last 168 hours. No results for input(s): AMMONIA in the last 168 hours. Coagulation Profile: No results for input(s): INR, PROTIME in the last 168 hours. CBC: Recent Labs  Lab 02/26/24 0525 02/27/24 0420 02/27/24 1702 02/28/24 0447 02/29/24 0450  WBC 8.5 6.8 5.7 5.7 7.2  HGB 8.7* 8.5* 9.1* 8.3* 9.0*  HCT 26.6* 25.7* 27.5* 25.3* 28.2*  MCV 102.3* 102.0* 103.8* 104.1* 105.2*  PLT 162 177 196 190 221   Cardiac Enzymes: No results for input(s): CKTOTAL, CKMB, CKMBINDEX, TROPONINI in the last 168 hours. BNP: Invalid input(s): POCBNP CBG: No results for input(s): GLUCAP in the last 168 hours. HbA1C: No results for input(s): HGBA1C in the last 72 hours. Urine analysis:    Component Value Date/Time   COLORURINE YELLOW 10/07/2023 1238   APPEARANCEUR CLEAR 10/07/2023 1238   LABSPEC 1.021 10/07/2023 1238   PHURINE 5.0 10/07/2023 1238   GLUCOSEU NEGATIVE 10/07/2023  1238   HGBUR MODERATE (A) 10/07/2023 1238   BILIRUBINUR NEGATIVE 10/07/2023 1238   KETONESUR 20 (A) 10/07/2023 1238   PROTEINUR NEGATIVE 10/07/2023 1238   NITRITE NEGATIVE 10/07/2023 1238   LEUKOCYTESUR NEGATIVE 10/07/2023 1238   Sepsis Labs: @LABRCNTIP (procalcitonin:4,lacticidven:4) ) Recent Results (from the past 240 hours)  MRSA Next Gen by PCR, Nasal     Status: None   Collection Time: 02/21/24  1:16 PM   Specimen: Nasal Mucosa; Nasal Swab  Result Value Ref Range Status   MRSA by PCR Next Gen NOT DETECTED NOT DETECTED Final    Comment: (NOTE) The GeneXpert MRSA Assay (FDA approved for NASAL specimens only), is one component of a comprehensive MRSA colonization surveillance program. It is not intended to diagnose MRSA infection nor to guide or monitor treatment for MRSA infections. Test performance is not FDA approved in patients less than 54 years old. Performed at [facility], [address],  [place], [place] [address]      Scheduled Meds:  amiodarone   200 mg Oral BID   Chlorhexidine  Gluconate Cloth  6 each Topical Q0600   vitamin B-12  1,000 mcg Oral Daily   diltiazem   180 mg Oral Daily   docusate sodium   100 mg Oral QPM   feeding supplement  1 Container Oral TID BM   irbesartan   150 mg Oral Daily   levothyroxine   100 mcg Oral QAC breakfast   magnesium  oxide  400 mg Oral Daily   multivitamin with minerals  1 tablet Oral Daily   pantoprazole   40 mg Oral BID   Continuous Infusions:  heparin  750 Units/hr (02/29/24 1742)    Procedures/Studies: DG CHEST PORT 1 VIEW Result Date: 02/23/2024 CLINICAL DATA:  Status post central line placement EXAM: PORTABLE CHEST 1 VIEW COMPARISON:  Chest radiograph dated 02/20/2024 FINDINGS: Lines/tubes: Right internal jugular venous catheter tip projects over the superior cavoatrial junction. Lungs: Low lung volumes with bronchovascular crowding. Focal opacity projecting over the left costophrenic angle. Pleura: Blunting of the left costophrenic angle.  No pneumothorax. Heart/mediastinum: Similar enlarged cardiomediastinal silhouette. Bones: No acute osseous abnormality. Partially imaged gas-filled mildly dilated upper abdominal bowel loops. IMPRESSION: 1. Right internal jugular venous catheter tip projects over the superior cavoatrial junction. No pneumothorax. 2. Trace left pleural effusion. Opacity projecting over the left costophrenic angle may represent atelectasis. Electronically Signed   By: Limin  Xu M.D.   On: 02/23/2024 14:21   US  EKG SITE RITE Result Date: 02/23/2024 If Site Rite image not attached, placement could not be confirmed due to current cardiac rhythm.  DG Chest Portable 1 View Result Date: 02/20/2024 CLINICAL DATA:  Tachycardia, shortness of breath and fatigue. EXAM: PORTABLE CHEST 1 VIEW COMPARISON:  Chest x-ray 10/09/2023 FINDINGS: The heart size and mediastinal contours are within normal limits. Both lungs are clear. The  visualized skeletal structures are unremarkable. IMPRESSION: No active disease. Electronically Signed   By: Greig Pique M.D.   On: 02/20/2024 19:47    Alm Schneider, DO  [facility]  If 7PM-7AM, please contact night-coverage [email or web address] Password [email or web address] 02/29/2024, 6:02 PM   LOS: 7 days

## 2024-02-29 NOTE — Progress Notes (Addendum)
 Briefly, patient is a 78 year old female who presented to the ED with aflutter with RVR, started on Heparin  drip on 9/26 with down trend in hemoglobin. GI consulted for further evaluation. Stool heme negative. No overt GI bleeding. Plan was for bidirectional endoscopy today, however, per nursing staff, patient consumed a sweet potato sometime last night. Rectal discharge is also not clear this AM. Notably hemoglobin actually improved to 9 from 8.3 yesterday.  After discussion with Dr. Shaaron, will provide extra prep today, and plan for endoscopic evaluations tomorrow.  -clear liquids today -will provide additional bowel prep -NPO midnight -continue to trend h&h, monitor for any overt GI bleeding -Heparin  can be resumed, will need to be stopped at 0600 tomorrow AM    Chelsea L. Mariette, MSN, APRN, AGNP-C Adult-Gerontology Nurse Practitioner [facility]     Attending note: Agree with above.

## 2024-03-01 ENCOUNTER — Encounter (HOSPITAL_COMMUNITY)
Admission: EM | Disposition: A | Discharge status: Home / Self Care | Payer: Self-pay | Source: Home / Self Care | Attending: Family Medicine

## 2024-03-01 ENCOUNTER — Inpatient Hospital Stay (HOSPITAL_COMMUNITY): Payer: Self-pay | Admitting: Certified Registered Nurse Anesthetist

## 2024-03-01 ENCOUNTER — Encounter (HOSPITAL_COMMUNITY): Payer: Self-pay | Admitting: Internal Medicine

## 2024-03-01 DIAGNOSIS — D649 Anemia, unspecified: Secondary | ICD-10-CM | POA: Diagnosis not present

## 2024-03-01 DIAGNOSIS — N1832 Chronic kidney disease, stage 3b: Secondary | ICD-10-CM

## 2024-03-01 DIAGNOSIS — I10 Essential (primary) hypertension: Secondary | ICD-10-CM | POA: Diagnosis not present

## 2024-03-01 DIAGNOSIS — K2970 Gastritis, unspecified, without bleeding: Secondary | ICD-10-CM

## 2024-03-01 DIAGNOSIS — K449 Diaphragmatic hernia without obstruction or gangrene: Secondary | ICD-10-CM

## 2024-03-01 DIAGNOSIS — I129 Hypertensive chronic kidney disease with stage 1 through stage 4 chronic kidney disease, or unspecified chronic kidney disease: Secondary | ICD-10-CM | POA: Diagnosis not present

## 2024-03-01 DIAGNOSIS — D62 Acute posthemorrhagic anemia: Secondary | ICD-10-CM

## 2024-03-01 DIAGNOSIS — K289 Gastrojejunal ulcer, unspecified as acute or chronic, without hemorrhage or perforation: Secondary | ICD-10-CM

## 2024-03-01 DIAGNOSIS — R7989 Other specified abnormal findings of blood chemistry: Secondary | ICD-10-CM | POA: Diagnosis not present

## 2024-03-01 DIAGNOSIS — I4892 Unspecified atrial flutter: Secondary | ICD-10-CM | POA: Diagnosis not present

## 2024-03-01 DIAGNOSIS — K6389 Other specified diseases of intestine: Secondary | ICD-10-CM

## 2024-03-01 DIAGNOSIS — Z789 Other specified health status: Secondary | ICD-10-CM | POA: Diagnosis not present

## 2024-03-01 DIAGNOSIS — Z980 Intestinal bypass and anastomosis status: Secondary | ICD-10-CM

## 2024-03-01 LAB — CBC
HCT: 28 % — ABNORMAL LOW (ref 36.0–46.0)
Hemoglobin: 9 g/dL — ABNORMAL LOW (ref 12.0–15.0)
MCH: 33.6 pg (ref 26.0–34.0)
MCHC: 32.1 g/dL (ref 30.0–36.0)
MCV: 104.5 fL — ABNORMAL HIGH (ref 80.0–100.0)
Platelets: 215 K/uL (ref 150–400)
RBC: 2.68 MIL/uL — ABNORMAL LOW (ref 3.87–5.11)
RDW: 13.2 % (ref 11.5–15.5)
WBC: 5.5 K/uL (ref 4.0–10.5)
nRBC: 0 % (ref 0.0–0.2)

## 2024-03-01 LAB — HEPARIN LEVEL (UNFRACTIONATED): Heparin Unfractionated: 0.65 [IU]/mL (ref 0.30–0.70)

## 2024-03-01 SURGERY — EGD (ESOPHAGOGASTRODUODENOSCOPY)
Anesthesia: General

## 2024-03-01 MED ORDER — PROPOFOL 500 MG/50ML IV EMUL
INTRAVENOUS | Status: DC | PRN
  Administered 2024-03-01: 70 mg via INTRAVENOUS
  Administered 2024-03-01: 40 mg via INTRAVENOUS
  Administered 2024-03-01: 125 ug/kg/min via INTRAVENOUS

## 2024-03-01 MED ORDER — STERILE WATER FOR IRRIGATION IR SOLN
Status: DC | PRN
  Administered 2024-03-01: 60 mL

## 2024-03-01 MED ORDER — LACTATED RINGERS IV SOLN: INTRAVENOUS | Status: DC

## 2024-03-01 MED ORDER — SODIUM CHLORIDE 0.9 % IV SOLN
INTRAVENOUS | Status: DC
Start: 2024-03-01 — End: 2024-03-01

## 2024-03-01 MED ORDER — LACTATED RINGERS IV SOLN: INTRAVENOUS | Status: DC | PRN

## 2024-03-01 MED ORDER — LIDOCAINE 2% (20 MG/ML) 5 ML SYRINGE
INTRAMUSCULAR | Status: DC | PRN
Start: 2024-03-01 — End: 2024-03-01
  Administered 2024-03-01: 60 mg via INTRAVENOUS

## 2024-03-01 MED ORDER — APIXABAN 5 MG PO TABS
5.0000 mg | ORAL_TABLET | Freq: Two times a day (BID) | ORAL | Status: DC
  Administered 2024-03-01 – 2024-03-02 (×2): 5 mg via ORAL
  Filled 2024-03-01 (×3): qty 1

## 2024-03-01 NOTE — Anesthesia Postprocedure Evaluation (Signed)
 Anesthesia Post Note  Patient: Felicia Frank  Procedure(s) Performed: EGD (ESOPHAGOGASTRODUODENOSCOPY) COLONOSCOPY  Patient location during evaluation: Phase II Anesthesia Type: General Level of consciousness: awake and alert Pain management: pain level controlled Vital Signs Assessment: post-procedure vital signs reviewed and stable Respiratory status: spontaneous breathing, nonlabored ventilation and respiratory function stable Cardiovascular status: stable Anesthetic complications: no   There were no known notable events for this encounter.   Last Vitals:  Vitals:   03/01/24 1506 03/01/24 1515  BP: (!) 132/51 (!) 112/41  Pulse: 73 (!) 121  Resp: 20 (!) 23  Temp: 36.6 C   SpO2: 100% 100%    Last Pain:  Vitals:   03/01/24 1506  TempSrc:   PainSc: 0-No pain                 Kordelia Severin L Zein Helbing

## 2024-03-01 NOTE — Anesthesia Procedure Notes (Signed)
 Date/Time: 03/01/2024 2:24 PM  Performed by: Para Jerelene CROME, CRNAOxygen Delivery Method: Nasal cannula Comments: OptiFlow Nasal Cannula.

## 2024-03-01 NOTE — Op Note (Addendum)
 [facility] Patient Name: Felicia Frank Procedure Date: 03/01/2024 2:08 PM MRN: [id number] Date of Birth: 1945/06/23 Attending MD: Deatrice Dine , MD, [id number] CSN: [id number] Age: 78 Admit Type: Outpatient Procedure:                Colonoscopy Indications:              Acute post hemorrhagic anemia, Unexplained iron                            deficiency anemia Providers:                Deatrice Dine, MD, Madelin Hunter, RN, Bascom Blush Referring MD:              Medicines:                Monitored Anesthesia Care Complications:            No immediate complications. Estimated Blood Loss:     Estimated blood loss: none. Estimated blood loss:                            none. Procedure:                Pre-Anesthesia Assessment:                           - Prior to the procedure, a History and Physical                            was performed, and patient medications and                            allergies were reviewed. The patient's tolerance of                            previous anesthesia was also reviewed. The risks                            and benefits of the procedure and the sedation                            options and risks were discussed with the patient.                            All questions were answered, and informed consent                            was obtained. Prior Anticoagulants: The patient has                            taken heparin . ASA Grade Assessment: III - A                            patient with severe systemic disease. After  reviewing the risks and benefits, the patient was                            deemed in satisfactory condition to undergo the                            procedure.                           After obtaining informed consent, the colonoscope                            was passed under direct vision. Throughout the                            procedure, the patient's blood pressure, pulse, and                             oxygen saturations were monitored continuously. The                            [id number] ([id number]) Peds Colon was introduced                            through the anus and advanced to the the                            ileocolonic anastomosis. The colonoscopy was                            performed without difficulty. The patient tolerated                            the procedure well. The quality of the bowel                            preparation was fair. Neo-cecum were photographed. Scope In: 2:39:21 PM Scope Out: 2:57:55 PM Scope Withdrawal Time: 0 hours 11 minutes 37 seconds  Total Procedure Duration: 0 hours 18 minutes 34 seconds  Findings:      A moderate amount of stool was found in the entire colon, precluding       visualization. Lavage of the area was performed using a large amount of       sterile water, resulting in clearance with fair visualization.      There is no endoscopic evidence of bleeding or mass in the entire colon.      An area of mildly nodular mucosa was found at the anastomosis. Biopsies       were taken with a cold forceps for histology. Impression:               - Preparation of the colon was fair.                           - Stool in the entire examined colon.                           -  No specimens collected.                           -Colonoscopy performed from rectal stump (10 cm) ,                            without any overt bleeding or masses                           -Colonoscopy performed via ostomy to neo-cecum (                            estimate 50cm remaining colon) ; no masses or large                            polyps/no evidence of old or active bleeding Moderate Sedation:      Per Anesthesia Care Recommendation:           No absolute GI contraindication to restart                            clinically indicated anticoagulation while patient                            is closely monitored                            restart diet                           follow up path                           The colonoscopy was not adequate for screening                            purposes given fair prep Procedure Code(s):        --- Professional ---                           [phone], Colonoscopy, flexible; with biopsy, single                            or multiple Diagnosis Code(s):        --- Professional ---                           D62, Acute posthemorrhagic anemia                           D50.9, Iron deficiency anemia, unspecified CPT copyright 2022 American Medical Association. All rights reserved. The codes documented in this report are preliminary and upon coder review may  be revised to meet current compliance requirements. Deatrice Dine, MD Deatrice Dine, MD 03/01/2024 3:08:17 PM This report has been signed electronically. Number of Addenda: 0

## 2024-03-01 NOTE — Op Note (Signed)
 [facility] Patient Name: Felicia Frank Procedure Date: 03/01/2024 2:09 PM MRN: [id number] Date of Birth: [birth year] Attending MD: Deatrice Dine , MD, [id number] CSN: [id number] Age: 78 Admit Type: Outpatient Procedure:                Upper GI endoscopy Indications:              Acute post hemorrhagic anemia, Unexplained iron                            deficiency anemia, Follow-up of peptic ulcer Providers:                Deatrice Dine, MD, Madelin Hunter, RN, Bascom Blush Referring MD:              Medicines:                Monitored Anesthesia Care Complications:            No immediate complications. Estimated Blood Loss:     Estimated blood loss: none. Procedure:                Pre-Anesthesia Assessment:                           - Prior to the procedure, a History and Physical                            was performed, and patient medications and                            allergies were reviewed. The patient's tolerance of                            previous anesthesia was also reviewed. The risks                            and benefits of the procedure and the sedation                            options and risks were discussed with the patient.                            All questions were answered, and informed consent                            was obtained. Prior Anticoagulants: The patient has                            taken heparin  [Days Prior to Procedure]. ASA Grade                            Assessment: III - A patient with severe systemic                            disease. After reviewing the risks and benefits,  the patient was deemed in satisfactory condition to                            undergo the procedure.                           After obtaining informed consent, the endoscope was                            passed under direct vision. Throughout the                            procedure, the patient's blood pressure, pulse,  and                            oxygen saturations were monitored continuously. The                            [id number] ([id number]) Upper was introduced through                            the mouth, and advanced to the second part of                            duodenum. The upper GI endoscopy was accomplished                            without difficulty. The patient tolerated the                            procedure well. Scope In: 2:27:40 PM Scope Out: 2:30:34 PM Total Procedure Duration: 0 hours 2 minutes 54 seconds  Findings:      The examined esophagus was normal.      A 2 cm hiatal hernia was present.      Mild inflammation characterized by erythema was found in the gastric       antrum. Biopsies were taken with a cold forceps for histology.      The duodenal bulb and second portion of the duodenum were normal.      There is no endoscopic evidence of bleeding or ulceration in the entire       examined stomach. Impression:               - Normal esophagus.                           - 2 cm hiatal hernia.                           - Gastritis. Biopsied.                           - Normal duodenal bulb and second portion of the                            duodenum.                           -  Previoulsy seen ulcer has completely healed Moderate Sedation:      Per Anesthesia Care Recommendation:           Proceed with colonoscopy Procedure Code(s):        --- Professional ---                           [phone], Esophagogastroduodenoscopy, flexible,                            transoral; with biopsy, single or multiple Diagnosis Code(s):        --- Professional ---                           K44.9, Diaphragmatic hernia without obstruction or                            gangrene                           K29.70, Gastritis, unspecified, without bleeding                           D62, Acute posthemorrhagic anemia                           D50.9, Iron deficiency anemia, unspecified                            K27.9, Peptic ulcer, site unspecified, unspecified                            as acute or chronic, without hemorrhage or                            perforation CPT copyright 2022 American Medical Association. All rights reserved. The codes documented in this report are preliminary and upon coder review may  be revised to meet current compliance requirements. Deatrice Dine, MD Deatrice Dine, MD 03/01/2024 2:35:40 PM This report has been signed electronically. Number of Addenda: 0

## 2024-03-01 NOTE — Anesthesia Preprocedure Evaluation (Addendum)
 Anesthesia Evaluation  Patient identified by MRN, date of birth, ID band Patient awake    Reviewed: Allergy & Precautions, H&P , NPO status , Patient's Chart, lab work & pertinent test results  Airway Mallampati: III  TM Distance: >3 FB Neck ROM: Full  Mouth opening: Limited Mouth Opening  Dental no notable dental hx. (+) Dental Advisory Given, Teeth Intact   Pulmonary neg pulmonary ROS   Pulmonary exam normal breath sounds clear to auscultation       Cardiovascular hypertension, Normal cardiovascular exam+ dysrhythmias Atrial Fibrillation  Rhythm:Regular Rate:Tachycardia  Recent ER visit for AF with RVR.  Can't do anticoagulation because of GI bleeding history.  History of cardiac event with increased troponins and elevated BNP   Neuro/Psych negative neurological ROS  negative psych ROS   GI/Hepatic Neg liver ROS,GERD  ,,  Endo/Other  Hypothyroidism    Renal/GU Renal InsufficiencyRenal diseaseStage 3b CKD  negative genitourinary   Musculoskeletal  (+) Arthritis ,    Abdominal   Peds negative pediatric ROS (+)  Hematology  (+) Blood dyscrasia, anemia   Anesthesia Other Findings   Reproductive/Obstetrics negative OB ROS                              Anesthesia Physical Anesthesia Plan  ASA: 3  Anesthesia Plan: General   Post-op Pain Management: Minimal or no pain anticipated   Induction: Intravenous  PONV Risk Score and Plan: Propofol  infusion  Airway Management Planned: Nasal Cannula and Natural Airway  Additional Equipment: None  Intra-op Plan:   Post-operative Plan:   Informed Consent: I have reviewed the patients History and Physical, chart, labs and discussed the procedure including the risks, benefits and alternatives for the proposed anesthesia with the patient or authorized representative who has indicated his/her understanding and acceptance.     Dental advisory  given  Plan Discussed with: CRNA  Anesthesia Plan Comments:         Anesthesia Quick Evaluation

## 2024-03-01 NOTE — Transfer of Care (Addendum)
 Immediate Anesthesia Transfer of Care Note  Patient: Felicia Frank  Procedure(s) Performed: EGD (ESOPHAGOGASTRODUODENOSCOPY) COLONOSCOPY  Patient Location: PACU  Anesthesia Type:General  Level of Consciousness: awake and patient cooperative  Airway & Oxygen Therapy: Patient Spontanous Breathing and Patient connected to nasal cannula oxygen  Post-op Assessment: Report given to RN and Post -op Vital signs reviewed and stable  Post vital signs: Reviewed and stable  Last Vitals:  Vitals Value Taken Time  BP 132/51 03/01/24 15:04  Temp 36.6 03/01/24   15:06  Pulse 73 03/01/24 15:06  Resp 20 03/01/24 15:06  SpO2 100 % 03/01/24 15:06  Vitals shown include unfiled device data.  Last Pain:  Vitals:   03/01/24 1423  TempSrc:   PainSc: 0-No pain      Patients Stated Pain Goal: 4 (03/01/24 1352)  Complications: No notable events documented.

## 2024-03-01 NOTE — Brief Op Note (Signed)
 Patient underwent Egd and colonoscopy via rectal stump and ostomy  under propofol  sedation.  Tolerated the procedure adequately.   FINDINGS:  EGD:  - 2 cm hiatal hernia. - Gastritis. Biopsied. - Normal duodenal bulb and second portion of the duodenum. - Previoulsy seen ulcer has completely healed  Colonoscopy :  - Preparation of the colon was fair. - Stool in the entire examined colon. - No specimens collected. - Colonoscopy performed from rectal stump ( 10 cm) , without any overt bleeding or masses - Colonoscopy performed via ostomy to neo- cecum ( 50cm) ; no masses or large polyps/ no evidence of old or active bleeding  RECOMMENDATIONS  -No absolute GI contraindication to restart clinically indicated anticoagulation while patient is closely monitored   -restart diet   -follow up path   -The colonoscopy was not adequate for screening purposes given fair prep.  Jovani Colquhoun Faizan Harsha Yusko, MD Gastroenterology and Hepatology [facility]

## 2024-03-01 NOTE — Progress Notes (Signed)
 Central line appeared to be longer than expected when completing dress change, MD notified and new order to remove it. No sutures present upon removal. catheter was sitting at 16cm mark, catheter length placed was 17cm.

## 2024-03-01 NOTE — Interval H&P Note (Signed)
 History and Physical Interval Note:  03/01/2024 1:43 PM  Felicia Frank  has presented today for surgery, with the diagnosis of anemia.  The various methods of treatment have been discussed with the patient and family. After consideration of risks, benefits and other options for treatment, the patient has consented to  Procedure(s): EGD (ESOPHAGOGASTRODUODENOSCOPY) (N/A) COLONOSCOPY (N/A) as a surgical intervention.  The patient's history has been reviewed, patient examined, no change in status, stable for surgery.  I have reviewed the patient's chart and labs.  Questions were answered to the patient's satisfaction.    EGD and colonoscopy via ostomy,   I thoroughly discussed with the patient the procedure, including the risks involved. Patient understands what the procedure involves including the benefits and any risks. Patient understands alternatives to the proposed procedure. Risks including (but not limited to) bleeding, tearing of the lining (perforation), rupture of adjacent organs, problems with heart and lung function, infection, and medication reactions. A small percentage of complications may require surgery, hospitalization, repeat endoscopic procedure, and/or transfusion.  Patient understood and agreed.     [name]

## 2024-03-01 NOTE — Progress Notes (Signed)
 PROGRESS NOTE  Felicia Frank FMW:[id number] DOB: [birth year] DOA: 02/20/2024 PCP: Dino Devere RAMAN, MD  Brief History:  Felicia Frank is a 78 y.o. female with medical history significant of HTN, hypothyroidism, Atrial fibrilation, CKD 3A who presents to the emergency department due to sensation of chest tightness and bilateral arm heaviness which started yesterday in the morning, this was associated with low energy.  She checked her heart rate at home and it ranged within 145 to 160 bpm.  This improved on waking up this morning to 80, but this quickly increased again.  Apparently, patient has not been taking her beta-blocker due to low blood pressure. Patient was not on any anticoagulant at this time due to GI bleed and anemia that was stopped when she was initially placed on Eliquis .   ED Course:  Patient was tachypneic, tachycardic, but other vital signs were within normal range.  Workup in the ED showed macrocytic anemia, BMP shows sodium 135, potassium 3.4, chloride 104, bicarb 18, blood glucose 116, BUN/creatinine 36/1.51 (baseline creatinine 1.0-1.1).  Troponin 43 > 52.  BNP 187. Chest x-ray showed no active disease IV Cardizem  10 mg x 1 was given, patient was started on Cardizem  drip.  [facility] was asked to admit patient   Assessment/Plan:  Paroxysmal atrial flutter with RVR  -Patient was treated with IV amiodarone  infusion and now transitioned to oral amiodarone  200 mg BID -She stopped taking her beta-blocker due to low blood pressures -Cardiology consult appreciated -avapro  150 mg daily started by cardiology -holding home carvedilol  for now  -after discussion with cardiology, reached out to Dr Shaaron and Dr. Kallie about potential rechallenge pt for full anticoagulation and after discussions they both recommend that it is reasonable to restart full anticoagulation now with careful monitoring. Dr Alvan discussed with patient and she is agreeable -IV heparin  started  9/26--Hgb drifted down from 11>>9 -GI consult requested -pt converted to SR  but went back into aflutter RVR, another bolus amio given - pt reports no black or blood stools but we will continue to follow this closely. Continue BID PPI for GI protection.  -9/28--oral amiodarone  200 mg BID -with elevated BPs, increased diltiazem  to 60 mg every 6 hours -- transitioned to oral diltiazem  CD 180 mg per cardio recs -stool hemoccults guaiac; Hgb remains stable last 3 days -10/2--planning endoscopic eval   Hypokalemia/hypomagnesia Maintaining serum K > 4.0, magnesium >2.0     Macrocytic anemia/B12 deficiency MCV 103.0; B12< 150 low , folate 12.5-normal -B12 supplement IM x 1 given and daily oral B12 1000 mcg ordered  -Hg trending down but no report of melen -hemoccult to stools ordered and negative x 2  -possibly hemodilution as cause of downtrend -BID PPI  -Hg holding stable at 8-9    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   No RWMA.  LV diastolic parameters indeterminate.  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  Continue ostomy bag care   Hx hemorrhagic shock -May  2025--required 8 units PRBC -10/21/23 EGD--nonbleeding gastric ulcer with clean base thought to be due to NG tube insertion   GERD Continue PPI   Compression fracture of L1 Continue Tylenol  as needed Patient states that she has an appointment  for surgical repair soon       Family Communication:   daughter 10/2   Consultants:  GI, cardiology   Code Status:  FULL    DVT Prophylaxis:  IV Heparin       Procedures: As Listed in Progress Note Above   Antibiotics: None          Subjective: Patient denies fevers, chills, headache, chest pain, dyspnea, nausea, vomiting, diarrhea, abdominal pain, dysuria, hematuria, hematochezia, and melena.   Objective: Vitals:   02/29/24 0553 02/29/24 1352 02/29/24 2108 03/01/24 0405  BP: (!) 156/50 129/62 (!) 155/52 (!) 156/58  Pulse: 75 74 70 75  Resp: 15   18  Temp: 99 F (37.2 C) 97.8 F (36.6 C) 98.4 F (36.9 C) 97.6 F (36.4 C)  TempSrc: Oral Oral Oral Oral  SpO2: 94% 99% 97% 97%  Weight:    73.3 kg  Height:        Intake/Output Summary (Last 24 hours) at 03/01/2024 1314 Last data filed at 02/29/2024 1800 Gross per 24 hour  Intake 482.24 ml  Output 900 ml  Net -417.76 ml   Weight change: -1.7 kg Exam:  General:  Pt is alert, follows commands appropriately, not in acute distress HEENT: No icterus, No thrush, No neck mass, /AT Cardiovascular: RRR, S1/S2, no rubs, no gallops Respiratory: CTA bilaterally, no wheezing, no crackles, no rhonchi Abdomen: Soft/+BS, non tender, non distended, no guarding Extremities: No edema, No lymphangitis, No petechiae, No rashes, no synovitis   Data Reviewed: I have personally reviewed following labs and imaging studies Basic Metabolic Panel: Recent Labs  Lab 02/25/24 0422 02/26/24 0525 02/26/24 0729 02/27/24 0420 02/28/24 0447 02/29/24 0450  NA 132* 132*  --  135 135 133*  K 3.7 4.1  --  3.9 4.6 4.7  CL 103 103  --  105 105 102  CO2 23 24  --  24 23 22   GLUCOSE 108* 85  --  86 85 81  BUN 30* 25*  --  25* 25* 21  CREATININE 1.59* 1.36*  --  1.26* 1.45* 1.49*  CALCIUM 8.1* 8.1*  --  8.3* 8.4* 9.3  MG 1.8  --  2.1 1.8 2.2 2.1   Liver Function Tests: No results for input(s): AST, ALT, ALKPHOS, BILITOT,  PROT, ALBUMIN  in the last 168 hours. No results for input(s): LIPASE, AMYLASE in the last 168 hours. No results for input(s): AMMONIA in the last 168 hours. Coagulation Profile: No results for input(s): INR, PROTIME in the last 168 hours. CBC: Recent Labs  Lab 02/27/24 0420 02/27/24 1702 02/28/24 0447 02/29/24 0450 03/01/24 0409  WBC 6.8 5.7 5.7 7.2 5.5  HGB 8.5* 9.1* 8.3* 9.0* 9.0*  HCT 25.7* 27.5* 25.3* 28.2* 28.0*  MCV 102.0* 103.8* 104.1* 105.2* 104.5*  PLT 177 196 190 221 215   Cardiac Enzymes: No results for input(s): CKTOTAL, CKMB, CKMBINDEX, TROPONINI in the last 168 hours. BNP: Invalid input(s): POCBNP CBG: No results for input(s): GLUCAP in the last 168 hours. HbA1C: No results for input(s): HGBA1C in the last 72 hours. Urine analysis:    Component Value Date/Time   COLORURINE YELLOW 10/07/2023 1238   APPEARANCEUR CLEAR 10/07/2023 1238   LABSPEC 1.021 10/07/2023 1238   PHURINE 5.0 10/07/2023 1238  GLUCOSEU NEGATIVE 10/07/2023 1238   HGBUR MODERATE (A) 10/07/2023 1238   BILIRUBINUR NEGATIVE 10/07/2023 1238   KETONESUR 20 (A) 10/07/2023 1238   PROTEINUR NEGATIVE 10/07/2023 1238   NITRITE NEGATIVE 10/07/2023 1238   LEUKOCYTESUR NEGATIVE 10/07/2023 1238   Sepsis Labs: @LABRCNTIP (procalcitonin:4,lacticidven:4) ) Recent Results (from the past 240 hours)  MRSA Next Gen by PCR, Nasal     Status: None   Collection Time: 02/21/24  1:16 PM   Specimen: Nasal Mucosa; Nasal Swab  Result Value Ref Range Status   MRSA by PCR Next Gen NOT DETECTED NOT DETECTED Final    Comment: (NOTE) The GeneXpert MRSA Assay (FDA approved for NASAL specimens only), is one component of a comprehensive MRSA colonization surveillance program. It is not intended to diagnose MRSA infection nor to guide or monitor treatment for MRSA infections. Test performance is not FDA approved in patients less than 64 years old. Performed at [facility], [address], [place], [place] [address]      Scheduled Meds:  amiodarone   200 mg Oral BID   Chlorhexidine  Gluconate Cloth  6 each Topical Q0600   vitamin B-12  1,000 mcg Oral Daily   diltiazem   180 mg Oral Daily   docusate sodium   100 mg Oral QPM   feeding supplement  1 Container Oral TID BM   irbesartan   150 mg Oral Daily   levothyroxine   100 mcg Oral QAC breakfast   magnesium  oxide  400 mg Oral Daily   multivitamin with minerals  1 tablet Oral Daily   pantoprazole   40 mg Oral BID   Continuous Infusions:  Procedures/Studies: DG CHEST PORT 1 VIEW Result Date: 02/23/2024 CLINICAL DATA:  Status post central line placement EXAM: PORTABLE CHEST 1 VIEW COMPARISON:  Chest radiograph dated 02/20/2024 FINDINGS: Lines/tubes: Right internal jugular venous catheter tip projects over the superior cavoatrial junction. Lungs: Low lung volumes with bronchovascular crowding. Focal opacity projecting over the left costophrenic angle. Pleura: Blunting of the left costophrenic angle.  No pneumothorax. Heart/mediastinum: Similar enlarged cardiomediastinal silhouette. Bones: No acute osseous abnormality. Partially imaged gas-filled mildly dilated upper abdominal bowel loops. IMPRESSION: 1. Right internal jugular venous catheter tip projects over the superior cavoatrial junction. No pneumothorax. 2. Trace left pleural effusion. Opacity projecting over the left costophrenic angle may represent atelectasis. Electronically Signed   By: Limin  Xu M.D.   On: 02/23/2024 14:21   US  EKG SITE RITE Result Date: 02/23/2024 If Site Rite image not attached, placement could not be confirmed due to current cardiac rhythm.  DG Chest Portable 1 View Result Date: 02/20/2024 CLINICAL DATA:  Tachycardia, shortness of breath and fatigue. EXAM: PORTABLE CHEST 1 VIEW COMPARISON:  Chest x-ray 10/09/2023 FINDINGS: The heart size and mediastinal contours are within normal limits. Both lungs are clear. The visualized skeletal structures are  unremarkable. IMPRESSION: No active disease. Electronically Signed   By: Greig Pique M.D.   On: 02/20/2024 19:47    Alm Schneider, DO  [facility]  If 7PM-7AM, please contact night-coverage [email or web address] Password [email or web address] 03/01/2024, 1:14 PM   LOS: 8 days

## 2024-03-01 NOTE — Progress Notes (Signed)
 Progress Note  Patient Name: Felicia Frank Date of Encounter: 03/01/2024  Primary Cardiologist: [name], MD  Subjective   No complaints.  Hemoglobin fluctuating but stable in the last 2 days.  Inpatient Medications    Scheduled Meds:  amiodarone   200 mg Oral BID   Chlorhexidine  Gluconate Cloth  6 each Topical Q0600   vitamin B-12  1,000 mcg Oral Daily   diltiazem   180 mg Oral Daily   docusate sodium   100 mg Oral QPM   feeding supplement  1 Container Oral TID BM   irbesartan   150 mg Oral Daily   levothyroxine   100 mcg Oral QAC breakfast   magnesium  oxide  400 mg Oral Daily   multivitamin with minerals  1 tablet Oral Daily   pantoprazole   40 mg Oral BID   Continuous Infusions:   PRN Meds: acetaminophen  **OR** acetaminophen , labetalol , ondansetron  **OR** ondansetron  (ZOFRAN ) IV, mouth rinse, prochlorperazine , traMADol , zolpidem    Vital Signs    Vitals:   02/29/24 0553 02/29/24 1352 02/29/24 2108 03/01/24 0405  BP: (!) 156/50 129/62 (!) 155/52 (!) 156/58  Pulse: 75 74 70 75  Resp: 15   18  Temp: 99 F (37.2 C) 97.8 F (36.6 C) 98.4 F (36.9 C) 97.6 F (36.4 C)  TempSrc: Oral Oral Oral Oral  SpO2: 94% 99% 97% 97%  Weight:    73.3 kg  Height:        Intake/Output Summary (Last 24 hours) at 03/01/2024 1105 Last data filed at 02/29/2024 1800 Gross per 24 hour  Intake 962.24 ml  Output 900 ml  Net 62.24 ml   Filed Weights   02/28/24 0511 02/29/24 0500 03/01/24 0405  Weight: 74.9 kg 75 kg 73.3 kg    Telemetry     Personally reviewed. In NSR  ECG    Not performed today.  Physical Exam   GEN: No acute distress.   Neck: No JVD. Cardiac: RRR, no murmur, rub, or gallop.  Respiratory: Nonlabored. Clear to auscultation bilaterally. GI: Soft, nontender, bowel sounds present. MS: No edema; No deformity. Neuro:  Nonfocal. Psych: Alert and oriented x 3. Normal affect.  Labs    Chemistry Recent Labs  Lab 02/27/24 0420 02/28/24 0447  02/29/24 0450  NA 135 135 133*  K 3.9 4.6 4.7  CL 105 105 102  CO2 24 23 22   GLUCOSE 86 85 81  BUN 25* 25* 21  CREATININE 1.26* 1.45* 1.49*  CALCIUM 8.3* 8.4* 9.3  GFRNONAA 44* 37* 36*  ANIONGAP 6 7 10      Hematology Recent Labs  Lab 02/28/24 0447 02/29/24 0450 03/01/24 0409  WBC 5.7 7.2 5.5  RBC 2.43* 2.68* 2.68*  HGB 8.3* 9.0* 9.0*  HCT 25.3* 28.2* 28.0*  MCV 104.1* 105.2* 104.5*  MCH 34.2* 33.6 33.6  MCHC 32.8 31.9 32.1  RDW 14.1 13.6 13.2  PLT 190 221 215    Cardiac Enzymes Recent Labs  Lab 02/20/24 2057 02/20/24 2300 02/21/24 0044 02/21/24 0325 02/21/24 0517  TROPONINIHS 52* 85* 105* 118* 93*    BNP No results for input(s): BNP, PROBNP in the last 168 hours.    DDimerNo results for input(s): DDIMER in the last 168 hours.   Radiology    No results found.  Assessment & Plan    Atrial flutter with RVR - New onset this admission.  On amiodarone  drip which was later switched to p.o. amiodarone  after adequate rate control.  However she did have intermittent atrial flutter with RVR episode throughout  this admission.  Challenging rate control despite amiodarone  drip.  Thankfully, heart rates are controlled and she is in normal sinus rhythm now. - She had new onset of atrial fibrillation with RVR in April 2025, underwent Hartman's procedure and was not started on systemic AC due to hemorrhagic shock requiring pressors and PRBC transfusion. - Rates now controlled and in NSR.  Continue amiodarone  200 mg twice daily, diltiazem  180 mg once daily, continue heparin  drip. Her hemoglobin has been gradually dropping from 11 (on 02/24/24) to 9 this a.m. but has been stable in the last 2 days no signs of active bleeding. Okay to continue heparin  drip for now.   - Hemoglobin has been stable for the last 2 days.  She is n.p.o. today for scope.  Okay to be discharged on DOAC after the scope.  Repeat CBC with her PCP.  If hemoglobin drops further on DOAC, can be referred  to structural cardiology for watchman implantation. She follows up with her cardiologist, Dr. Lillia Blacksmith in [place] . - Echo from April 2025 showed normal LVEF.  History of hemorrhagic shock - Patient had history of hemorrhagic shock in May 2025 requiring pressors and 8 units PRBC transfusion.  She was not on systemic AC until  02/24/2024 due to intermittent atrial flutter with RVR on this admission. EGD in May 2025 showed nonbleeding gastric ulcer that was thought to be secondary to NG tube insertion.   35 minutes spent in reviewing the prior notes, more than 3 labs, discussion of the above problems with the patient and her daughter, answering all her questions and documentation.  Complex decision making.  Signed, Jakell Trusty SHAUNNA Maywood, MD  03/01/2024, 11:05 AM

## 2024-03-01 NOTE — Plan of Care (Signed)

## 2024-03-02 ENCOUNTER — Telehealth: Payer: Self-pay | Admitting: Gastroenterology

## 2024-03-02 DIAGNOSIS — N1832 Chronic kidney disease, stage 3b: Secondary | ICD-10-CM | POA: Diagnosis not present

## 2024-03-02 DIAGNOSIS — D649 Anemia, unspecified: Secondary | ICD-10-CM | POA: Diagnosis not present

## 2024-03-02 DIAGNOSIS — I4892 Unspecified atrial flutter: Secondary | ICD-10-CM | POA: Diagnosis not present

## 2024-03-02 DIAGNOSIS — K2970 Gastritis, unspecified, without bleeding: Secondary | ICD-10-CM | POA: Diagnosis not present

## 2024-03-02 DIAGNOSIS — K2990 Gastroduodenitis, unspecified, without bleeding: Secondary | ICD-10-CM

## 2024-03-02 DIAGNOSIS — I480 Paroxysmal atrial fibrillation: Secondary | ICD-10-CM | POA: Diagnosis not present

## 2024-03-02 LAB — BASIC METABOLIC PANEL WITH GFR
Anion gap: 10 (ref 5–15)
BUN: 16 mg/dL (ref 8–23)
CO2: 23 mmol/L (ref 22–32)
Calcium: 8.9 mg/dL (ref 8.9–10.3)
Chloride: 102 mmol/L (ref 98–111)
Creatinine, Ser: 1.4 mg/dL — ABNORMAL HIGH (ref 0.44–1.00)
GFR, Estimated: 38 mL/min — ABNORMAL LOW (ref >60)
Glucose, Bld: 81 mg/dL (ref 70–99)
Potassium: 4.4 mmol/L (ref 3.5–5.1)
Sodium: 134 mmol/L — ABNORMAL LOW (ref 135–145)

## 2024-03-02 LAB — CBC
HCT: 27.1 % — ABNORMAL LOW (ref 36.0–46.0)
Hemoglobin: 8.8 g/dL — ABNORMAL LOW (ref 12.0–15.0)
MCH: 33.6 pg (ref 26.0–34.0)
MCHC: 32.5 g/dL (ref 30.0–36.0)
MCV: 103.4 fL — ABNORMAL HIGH (ref 80.0–100.0)
Platelets: 225 K/uL (ref 150–400)
RBC: 2.62 MIL/uL — ABNORMAL LOW (ref 3.87–5.11)
RDW: 13.3 % (ref 11.5–15.5)
WBC: 5.3 K/uL (ref 4.0–10.5)
nRBC: 0 % (ref 0.0–0.2)

## 2024-03-02 LAB — MAGNESIUM: Magnesium: 1.7 mg/dL (ref 1.7–2.4)

## 2024-03-02 MED ORDER — APIXABAN 5 MG PO TABS: 5.0000 mg | ORAL_TABLET | Freq: Two times a day (BID) | ORAL | 1 refills | Status: AC

## 2024-03-02 MED ORDER — PANTOPRAZOLE SODIUM 40 MG PO TBEC: 40.0000 mg | DELAYED_RELEASE_TABLET | Freq: Every day | ORAL | 1 refills | Status: AC

## 2024-03-02 MED ORDER — VITAMIN B-12 100 MCG PO TABS: 500.0000 ug | ORAL_TABLET | Freq: Every day | ORAL | Status: DC

## 2024-03-02 MED ORDER — AMIODARONE HCL 200 MG PO TABS: 200.0000 mg | ORAL_TABLET | Freq: Two times a day (BID) | ORAL | 1 refills | Status: AC

## 2024-03-02 MED ORDER — CLONIDINE HCL 0.1 MG PO TABS: 0.1000 mg | ORAL_TABLET | Freq: Three times a day (TID) | ORAL | Status: AC | PRN

## 2024-03-02 MED ORDER — DILTIAZEM HCL ER COATED BEADS 180 MG PO CP24: 180.0000 mg | ORAL_CAPSULE | Freq: Every day | ORAL | 1 refills | Status: AC

## 2024-03-02 MED ORDER — CYANOCOBALAMIN 500 MCG PO TABS: 500.0000 ug | ORAL_TABLET | Freq: Every day | ORAL | Status: AC

## 2024-03-02 NOTE — Telephone Encounter (Signed)
 Schedule hospital follow-up with AB in 3-4 weeks for follow-up of anemia.  Dena -please order CBC for the patient in 1 week and notify her to have this performed. Dx: anemia

## 2024-03-02 NOTE — Care Management Important Message (Signed)
 Important Message  Patient Details  Name: Felicia Frank MRN: [id number] Date of Birth: 1945-11-15   Important Message Given:  Yes - Medicare IM (late entry)     Duwaine LITTIE Ada 03/02/2024, 12:48 PM

## 2024-03-02 NOTE — Progress Notes (Signed)
 Progress Note  Patient Name: Felicia Frank Date of Encounter: 03/02/2024  Primary Cardiologist: Felicia JONETTA BLACKSMITH, MD  Subjective   Hemoglobin stable after starting Eliquis .  EGD/colonoscopy normal, prior gastric ulcer healed.  Inpatient Medications    Scheduled Meds:  amiodarone   200 mg Oral BID   apixaban   5 mg Oral BID   Chlorhexidine  Gluconate Cloth  6 each Topical Q0600   diltiazem   180 mg Oral Daily   docusate sodium   100 mg Oral QPM   feeding supplement  1 Container Oral TID BM   irbesartan   150 mg Oral Daily   levothyroxine   100 mcg Oral QAC breakfast   magnesium  oxide  400 mg Oral Daily   multivitamin with minerals  1 tablet Oral Daily   pantoprazole   40 mg Oral BID   [START ON 03/03/2024] vitamin B-12  500 mcg Oral Daily   Continuous Infusions:   PRN Meds: acetaminophen  **OR** acetaminophen , labetalol , ondansetron  **OR** ondansetron  (ZOFRAN ) IV, mouth rinse, prochlorperazine , traMADol , zolpidem    Vital Signs    Vitals:   03/01/24 1620 03/01/24 1835 03/01/24 2001 03/02/24 0453  BP: 129/74  (!) 121/49 (!) 147/50  Pulse: 74 80 68 67  Resp: 20     Temp: (!) 97.5 F (36.4 C)  97.7 F (36.5 C) 98.9 F (37.2 C)  TempSrc: Oral  Oral Oral  SpO2: 98%  99% 96%  Weight:    73.5 kg  Height:        Intake/Output Summary (Last 24 hours) at 03/02/2024 1052 Last data filed at 03/01/2024 1506 Gross per 24 hour  Intake 400 ml  Output --  Net 400 ml   Filed Weights   02/29/24 0500 03/01/24 0405 03/02/24 0453  Weight: 75 kg 73.3 kg 73.5 kg    Telemetry     Personally reviewed. In NSR  ECG    Not performed today.  Physical Exam   GEN: No acute distress.   Neck: No JVD. Cardiac: RRR, no murmur, rub, or gallop.  Respiratory: Nonlabored. Clear to auscultation bilaterally. GI: Soft, nontender, bowel sounds present. MS: No edema; No deformity. Neuro:  Nonfocal. Psych: Alert and oriented x 3. Normal affect.  Labs    Chemistry Recent Labs  Lab  02/28/24 0447 02/29/24 0450 03/02/24 0435  NA 135 133* 134*  K 4.6 4.7 4.4  CL 105 102 102  CO2 23 22 23   GLUCOSE 85 81 81  BUN 25* 21 16  CREATININE 1.45* 1.49* 1.40*  CALCIUM 8.4* 9.3 8.9  GFRNONAA 37* 36* 38*  ANIONGAP 7 10 10      Hematology Recent Labs  Lab 02/29/24 0450 03/01/24 0409 03/02/24 0435  WBC 7.2 5.5 5.3  RBC 2.68* 2.68* 2.62*  HGB 9.0* 9.0* 8.8*  HCT 28.2* 28.0* 27.1*  MCV 105.2* 104.5* 103.4*  MCH 33.6 33.6 33.6  MCHC 31.9 32.1 32.5  RDW 13.6 13.2 13.3  PLT 221 215 225    Cardiac Enzymes Recent Labs  Lab 02/20/24 2057 02/20/24 2300 02/21/24 0044 02/21/24 0325 02/21/24 0517  TROPONINIHS 52* 85* 105* 118* 93*    BNP No results for input(s): BNP, PROBNP in the last 168 hours.    DDimerNo results for input(s): DDIMER in the last 168 hours.   Radiology    No results found.  Assessment & Plan    Atrial flutter with RVR - New onset this admission.  On amiodarone  drip which was later switched to p.o. amiodarone  after adequate rate control.  However she did have  intermittent atrial flutter with RVR episode throughout this admission.  Challenging rate control despite amiodarone  drip.  Thankfully, heart rates are controlled and she is in normal sinus rhythm now. - She had new onset of atrial fibrillation with RVR in April 2025, underwent Hartman's procedure and was not started on systemic AC due to hemorrhagic shock requiring pressors and PRBC transfusion. - Continues to be in normal sinus rhythm.  Continue amiodarone  200 mg twice daily, diltiazem  180 mg once daily.  Heparin  drip switched to Eliquis  5 mg twice daily yesterday after EGD/colonoscopy were within normal limits.  Hemoglobin remained stable.  Does not need structural referral for watchman implantation as of now. She follows up with her cardiologist, Dr. Lillia Frank in [place] . - Echo from April 2025 showed normal LVEF.  History of hemorrhagic shock - Patient had history of  hemorrhagic shock in May 2025 requiring pressors and 8 units PRBC transfusion.  She was not on systemic AC until  02/24/2024 due to intermittent atrial flutter with RVR on this admission. EGD in May 2025 showed nonbleeding gastric ulcer that was thought to be secondary to NG tube insertion. - EGD/colonoscopy yesterday were within normal limits.  Prior gastric ulcer completely healed now.   30 minutes spent in reviewing the prior notes, more than 3 labs, discussion of the above problems with the patient and her daughter, answering all her questions and documentation.   Signed, Felicia SHAUNNA Maywood, MD  03/02/2024, 10:52 AM

## 2024-03-02 NOTE — Discharge Summary (Signed)
 Physician Discharge Summary   Patient: Felicia Frank MRN: [id number] DOB: [birth year]  Admit date:     02/20/2024  Discharge date: 03/02/24  Discharge Physician: Alm Jaylani Mcguinn   PCP: Dino Devere RAMAN, MD   Recommendations at discharge:   Please follow up with primary care provider within 1-2 weeks  Please repeat BMP and CBC in one week   Hospital Course: Marlia Schewe is a 78 y.o. female with medical history significant of HTN, hypothyroidism, Atrial fibrilation, CKD 3A who presents to the emergency department due to sensation of chest tightness and bilateral arm heaviness which started yesterday in the morning, this was associated with low energy.  She checked her heart rate at home and it ranged within 145 to 160 bpm.  This improved on waking up this morning to 80, but this quickly increased again.  Apparently, patient has not been taking her beta-blocker due to low blood pressure. Patient was not on any anticoagulant at this time due to GI bleed and anemia that was stopped when she was initially placed on Eliquis .   ED Course:  Patient was tachypneic, tachycardic, but other vital signs were within normal range.  Workup in the ED showed macrocytic anemia, BMP shows sodium 135, potassium 3.4, chloride 104, bicarb 18, blood glucose 116, BUN/creatinine 36/1.51 (baseline creatinine 1.0-1.1).  Troponin 43 > 52.  BNP 187. Chest x-ray showed no active disease IV Cardizem  10 mg x 1 was given, patient was started on Cardizem  drip.  [facility] was asked to admit patient  Assessment and Plan: Paroxysmal atrial flutter with RVR  -Patient was treated with IV amiodarone  infusion and now transitioned to oral amiodarone  200 mg BID -She stopped taking her beta-blocker due to low blood pressures -Cardiology consult appreciated -avapro  150 mg daily started by cardiology>>will go home to resume her telmisartan -holding home carvedilol  for now --will no restart -after discussion with cardiology, reached out  to Dr Shaaron and Dr. Kallie about potential rechallenge pt for full anticoagulation and after discussions they both recommend that it is reasonable to restart full anticoagulation now with careful monitoring. Dr Alvan discussed with patient and she is agreeable -IV heparin  started 9/26--Hgb drifted down from 11>>9 -GI consult appreciated -pt converted to SR  but went back into aflutter RVR, another bolus amio given>>sinus - pt reports no black or blood stools but we will continue to follow this closely. Continue BID PPI for GI protection.  -9/28--oral amiodarone  200 mg BID -with elevated BPs, increased diltiazem  to 60 mg every 6 hours -- transitioned to oral diltiazem  CD 180 mg per cardio recs -stool hemoccults guaiac; Hgb remains stable last 4 days -10/2--EGD--mild gastritis -10/2--colonoscopy--no overt bleeding or masses -10/2--started apixban>>Hgb remained stable; no signs of active bleed   Hypokalemia/hypomagnesia Maintaining serum K > 4.0, magnesium >2.0     Macrocytic anemia/B12 deficiency MCV 103.0; B12< 150 low , folate 12.5-normal -B12 supplement IM x 1 given and daily oral B12 1000 mcg ordered  -Hg trending down but no report of melen -hemoccult to stools ordered and negative x 2  -possibly hemodilution as cause of downtrend -Hg holding stable at 8-9 -d/c home with B12 tab 500 mcg daily, repeat B12 level in one month    CKD 3B --baseline creatiine 1.3-1.6 -serum creatinine 1.40 on day of d/c   Hypokalemia -- oral replacement given and repleted    Hypomagnesemia -- repleted to goal >2   Elevated BNP BNP 187  -No respiratory distress, hemodynamically stable, no signs of fluid overload -09/21/2023 Echo  LVEF of 65 to 70%.   No RWMA.  LV diastolic parameters indeterminate.  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  Continue ostomy bag care   Hx hemorrhagic shock -May 2025--required 8 units PRBC -10/21/23 EGD--nonbleeding gastric ulcer with clean base thought to be due to NG tube insertion   GERD Continue PPI   Compression fracture of L1 Continue Tylenol  as needed Patient states that she has an appointment for surgical repair soon         Consultants: GI, cardiology Procedures performed: EGD/colonoscopy  Disposition: Home Diet recommendation:  Cardiac diet DISCHARGE MEDICATION: Allergies as of 03/02/2024       Reactions   Amlodipine Swelling   Clonidine Rash   Rash with patch only.  Okay to take pill        Medication List     STOP taking these medications    aspirin  EC 81 MG tablet   carvedilol  25 MG tablet Commonly known as: COREG    doxazosin  8 MG tablet Commonly known as: CARDURA    hydrALAZINE 25 MG tablet Commonly known as: APRESOLINE       TAKE these medications    amiodarone  200 MG tablet Commonly known as: PACERONE  Take 1 tablet (200 mg total) by mouth 2 (two) times daily.   apixaban  5 MG Tabs tablet Commonly known as: ELIQUIS  Take 1 tablet (5 mg total) by mouth 2 (two) times daily.   bumetanide  1 MG tablet Commonly known as: BUMEX  Take 0.5-1 mg by mouth daily as needed (fluid retention.).   cloNIDine 0.1 MG tablet Commonly known as: CATAPRES Take 1 tablet (0.1 mg total) by mouth 3 (three) times daily as needed (HIGH BLOOD PRESSURE (BP > 160)). What changed: reasons to take this   cyanocobalamin  500 MCG tablet Commonly known as: VITAMIN B12 Take 1 tablet (500 mcg total) by mouth daily. Start taking on: March 03, 2024   diltiazem  180 MG 24 hr capsule Commonly known as: CARDIZEM  CD Take 1 capsule (180 mg total) by mouth daily. Start taking on:  March 03, 2024   famotidine  40 MG tablet Commonly known as: PEPCID  Take 40 mg by mouth in the morning.   levothyroxine  100 MCG tablet Commonly known as: SYNTHROID  Take 100 mcg by mouth daily before breakfast.   magnesium  oxide 400 MG tablet Commonly known as: MAG-OX Take 400 mg by mouth daily.   pantoprazole  40 MG tablet Commonly known as: PROTONIX  Take 1 tablet (40 mg total) by mouth daily.   spironolactone-hydrochlorothiazide 25-25 MG tablet Commonly known as: ALDACTAZIDE Take 1 tablet by mouth as directed. Every other day as needed for Fluid   telmisartan 80 MG tablet Commonly known as: MICARDIS Take 80 mg by mouth in the morning.   traMADol  50 MG tablet Commonly known as: ULTRAM  Take 50 mg by mouth every 12 (twelve) hours as needed (pain.).   zolpidem  10 MG tablet Commonly known as: AMBIEN  Take 10 mg by mouth at bedtime as needed.        Follow-up Information     Rosalba Lillia BIRCH, MD Follow up.   Specialty: Cardiology Why: Keep scheduled Cardiology follow-up. Contact information: [address] [place] [address] [phone]  Discharge Exam: Filed Weights   02/29/24 0500 03/01/24 0405 03/02/24 0453  Weight: 75 kg 73.3 kg 73.5 kg   HEENT:  [place]/AT, No thrush, no icterus CV:  RRR, no rub, no S3, no S4 Lung:  CTA, no wheeze, no rhonchi Abd:  soft/+BS, NT Ext:  No edema, no lymphangitis, no synovitis, no rash   Condition at discharge: stable  The results of significant diagnostics from this hospitalization (including imaging, microbiology, ancillary and laboratory) are listed below for reference.   Imaging Studies: DG CHEST PORT 1 VIEW Result Date: 02/23/2024 CLINICAL DATA:  Status post central line placement EXAM: PORTABLE CHEST 1 VIEW COMPARISON:  Chest radiograph dated 02/20/2024 FINDINGS: Lines/tubes: Right internal jugular venous catheter tip projects over the superior cavoatrial junction. Lungs: Low lung  volumes with bronchovascular crowding. Focal opacity projecting over the left costophrenic angle. Pleura: Blunting of the left costophrenic angle.  No pneumothorax. Heart/mediastinum: Similar enlarged cardiomediastinal silhouette. Bones: No acute osseous abnormality. Partially imaged gas-filled mildly dilated upper abdominal bowel loops. IMPRESSION: 1. Right internal jugular venous catheter tip projects over the superior cavoatrial junction. No pneumothorax. 2. Trace left pleural effusion. Opacity projecting over the left costophrenic angle may represent atelectasis. Electronically Signed   By: Limin  Xu M.D.   On: 02/23/2024 14:21   US  EKG SITE RITE Result Date: 02/23/2024 If Site Rite image not attached, placement could not be confirmed due to current cardiac rhythm.  DG Chest Portable 1 View Result Date: 02/20/2024 CLINICAL DATA:  Tachycardia, shortness of breath and fatigue. EXAM: PORTABLE CHEST 1 VIEW COMPARISON:  Chest x-ray 10/09/2023 FINDINGS: The heart size and mediastinal contours are within normal limits. Both lungs are clear. The visualized skeletal structures are unremarkable. IMPRESSION: No active disease. Electronically Signed   By: Greig Pique M.D.   On: 02/20/2024 19:47    Microbiology: Results for orders placed or performed during the hospital encounter of 02/20/24  MRSA Next Gen by PCR, Nasal     Status: None   Collection Time: 02/21/24  1:16 PM   Specimen: Nasal Mucosa; Nasal Swab  Result Value Ref Range Status   MRSA by PCR Next Gen NOT DETECTED NOT DETECTED Final    Comment: (NOTE) The GeneXpert MRSA Assay (FDA approved for NASAL specimens only), is one component of a comprehensive MRSA colonization surveillance program. It is not intended to diagnose MRSA infection nor to guide or monitor treatment for MRSA infections. Test performance is not FDA approved in patients less than 63 years old. Performed at [facility], [address], [place], [place] [address]      Labs: CBC: Recent Labs  Lab 02/27/24 1702 02/28/24 0447 02/29/24 0450 03/01/24 0409 03/02/24 0435  WBC 5.7 5.7 7.2 5.5 5.3  HGB 9.1* 8.3* 9.0* 9.0* 8.8*  HCT 27.5* 25.3* 28.2* 28.0* 27.1*  MCV 103.8* 104.1* 105.2* 104.5* 103.4*  PLT 196 190 221 215 225   Basic Metabolic Panel: Recent Labs  Lab 02/26/24 0525 02/26/24 0729 02/27/24 0420 02/28/24 0447 02/29/24 0450 03/02/24 0435  NA 132*  --  135 135 133* 134*  K 4.1  --  3.9 4.6 4.7 4.4  CL 103  --  105 105 102 102  CO2 24  --  24 23 22 23   GLUCOSE 85  --  86 85 81 81  BUN 25*  --  25* 25* 21 16  CREATININE 1.36*  --  1.26* 1.45* 1.49* 1.40*  CALCIUM 8.1*  --  8.3* 8.4* 9.3 8.9  MG  --  2.1 1.8 2.2 2.1 1.7   Liver Function Tests: No results for input(s): AST, ALT, ALKPHOS, BILITOT, PROT, ALBUMIN  in the last 168 hours. CBG: No results for input(s): GLUCAP in the last 168 hours.  Discharge time spent: greater than 30 minutes.  Signed: Alm Schneider, MD [facility] 03/02/2024

## 2024-03-05 LAB — SURGICAL PATHOLOGY

## 2024-03-06 ENCOUNTER — Ambulatory Visit (INDEPENDENT_AMBULATORY_CARE_PROVIDER_SITE_OTHER): Payer: Self-pay | Admitting: Gastroenterology

## 2024-03-06 ENCOUNTER — Other Ambulatory Visit: Payer: Self-pay

## 2024-03-06 DIAGNOSIS — D649 Anemia, unspecified: Secondary | ICD-10-CM

## 2024-03-06 NOTE — Telephone Encounter (Signed)
 Labs put in @ [facility]. Pt was made aware. Pt advises that she is going to see her PCP on Oct. 22nd and she would do it then. Pt states she isn't doing this twice.  Felicia Frank the pt wants a late evening appt instead of the morning appt she has already with Therisa Stager, NP on November 5th. Please call the pt to reschedule

## 2024-03-06 NOTE — Telephone Encounter (Signed)
 [place] please make sure appt is with Therisa Stager, NP

## 2024-03-12 ENCOUNTER — Encounter (HOSPITAL_COMMUNITY): Payer: Self-pay | Admitting: Gastroenterology

## 2024-03-16 NOTE — Progress Notes (Signed)
 Patient result letter mailed

## 2024-04-04 ENCOUNTER — Inpatient Hospital Stay: Admitting: Gastroenterology

## 2024-04-23 ENCOUNTER — Inpatient Hospital Stay: Admitting: Gastroenterology

## 2024-07-25 ENCOUNTER — Ambulatory Visit: Admitting: Gastroenterology
